# Patient Record
Sex: Male | Born: 1950 | Race: White | Hispanic: No | Marital: Married | Smoking: Never smoker
Health system: Southern US, Community
[De-identification: ages and names within clinical notes are randomized; demographics above are authoritative.]

## PROBLEM LIST (undated history)

## (undated) DIAGNOSIS — I10 Essential (primary) hypertension: Secondary | ICD-10-CM

## (undated) DIAGNOSIS — R011 Cardiac murmur, unspecified: Secondary | ICD-10-CM

## (undated) DIAGNOSIS — R7303 Prediabetes: Secondary | ICD-10-CM

## (undated) DIAGNOSIS — G4730 Sleep apnea, unspecified: Secondary | ICD-10-CM

## (undated) DIAGNOSIS — J189 Pneumonia, unspecified organism: Secondary | ICD-10-CM

## (undated) DIAGNOSIS — E119 Type 2 diabetes mellitus without complications: Secondary | ICD-10-CM

## (undated) DIAGNOSIS — K219 Gastro-esophageal reflux disease without esophagitis: Secondary | ICD-10-CM

## (undated) DIAGNOSIS — M1990 Unspecified osteoarthritis, unspecified site: Secondary | ICD-10-CM

## (undated) DIAGNOSIS — I82409 Acute embolism and thrombosis of unspecified deep veins of unspecified lower extremity: Secondary | ICD-10-CM

## (undated) HISTORY — DX: Gastro-esophageal reflux disease without esophagitis: K21.9

## (undated) HISTORY — PX: KNEE ARTHROSCOPY: SUR90

## (undated) HISTORY — PX: NASAL FRACTURE SURGERY: SHX718

## (undated) HISTORY — DX: Acute embolism and thrombosis of unspecified deep veins of unspecified lower extremity: I82.409

## (undated) HISTORY — PX: KNEE ARTHROPLASTY: SHX992

---

## 1978-06-14 HISTORY — PX: NASAL FRACTURE SURGERY: SHX718

## 1988-06-14 HISTORY — PX: CERVICAL SPINE SURGERY: SHX589

## 1997-11-30 ENCOUNTER — Ambulatory Visit (HOSPITAL_COMMUNITY): Admission: RE | Admit: 1997-11-30 | Discharge: 1997-11-30 | Payer: Self-pay | Admitting: Neurosurgery

## 2003-06-19 ENCOUNTER — Ambulatory Visit (HOSPITAL_COMMUNITY): Admission: RE | Admit: 2003-06-19 | Discharge: 2003-06-19 | Payer: Self-pay | Admitting: Orthopedic Surgery

## 2006-06-14 HISTORY — PX: FINGER SURGERY: SHX640

## 2006-10-12 ENCOUNTER — Ambulatory Visit (HOSPITAL_BASED_OUTPATIENT_CLINIC_OR_DEPARTMENT_OTHER): Admission: RE | Admit: 2006-10-12 | Discharge: 2006-10-12 | Payer: Self-pay | Admitting: Orthopedic Surgery

## 2006-11-02 ENCOUNTER — Ambulatory Visit (HOSPITAL_BASED_OUTPATIENT_CLINIC_OR_DEPARTMENT_OTHER): Admission: RE | Admit: 2006-11-02 | Discharge: 2006-11-02 | Payer: Self-pay | Admitting: Orthopedic Surgery

## 2007-03-01 ENCOUNTER — Encounter (INDEPENDENT_AMBULATORY_CARE_PROVIDER_SITE_OTHER): Payer: Self-pay | Admitting: Orthopedic Surgery

## 2007-03-01 ENCOUNTER — Ambulatory Visit (HOSPITAL_BASED_OUTPATIENT_CLINIC_OR_DEPARTMENT_OTHER): Admission: RE | Admit: 2007-03-01 | Discharge: 2007-03-01 | Payer: Self-pay | Admitting: Orthopedic Surgery

## 2010-10-27 NOTE — Op Note (Signed)
NAME:  James Gallegos, DHONDT NO.:  [id number]   MEDICAL RECORD NO.:  [id number]          PATIENT TYPE:  AMB   LOCATION:  DSC                          FACILITY:  [facility]   PHYSICIAN:  Artist Pais. Weingold, M.D.DATE OF BIRTH:  06-28-1950   DATE OF PROCEDURE:  03/01/2007  DATE OF DISCHARGE:                               OPERATIVE REPORT   PREOPERATIVE DIAGNOSIS:  1. Retained hardware, right index finger, status post corrective      osteotomy.  2. Subcutaneous mass, right forearm.   POSTOPERATIVE DIAGNOSIS:  1. Retained hardware, right index finger, status post corrective      osteotomy.  2. Subcutaneous mass, right forearm.   PROCEDURE:  1. Biopsy soft tissue mass distal forearm subcutaneous lesion over the      ulna.  2. Hardware removal right index finger with extensor tenolysis and      manipulation of metacarpal phalangeal joint and proximal      interphalangeal joint.   SURGEON:  Artist Pais. Mina Marble, M.D.   ASSISTANT:  None.   ANESTHESIA:  General.   TOURNIQUET TIME:  36 minutes.   COMPLICATIONS:  None.   DRAINS:  None.   OPERATIVE REPORT:  The patient was taken to the operating suite. After  induction of adequate general anesthesia, the right upper extremity was  prepped and draped in the usual sterile fashion.  An Esmarch was used to  exsanguinate the limb.  The tourniquet was inflated to 250 mmHg.  At  this point in time, a 2 cm was made in the subcutaneous area of the  ulna, distal third, where a small subcutaneous mass was identified.  The  skin was incised.  Dissection was carried down to what appeared to be a  small calcified lesion, well circumscribed. It was excised.  The wound  was irrigated and loosely closed with a 3-0 Prolene subcuticular stitch.   Attention was turned to the index finger.  Using the previous  curvilinear incision of the proximal phalanx, the skin was incised.  A  radially based flap was elevated and retracted using 4-0  nylon sutures.  The extensor mechanism was split longitudinally and carefully elevated  off the underlying periosteum and dorsal aspect of the proximal phalanx.  The periosteum was carefully elevated in a trapdoor fashion revealing  the previously placed 1.5 mm modular handset T-plate. The T-plate and  screws were removed. At this point in time, an extensive tenolysis was  performed proximally and distally using a Therapist, nutritional and tenotomy  scissors, proximal to the metacarpal phalangeal joint and distal to the  proximal phalangeal joint.  After this was done, the metacarpal  phalangeal joint and proximal interphalangeal joint were carefully  manipulated into full flexion.   After this done, the wound was irrigated.  Hemostasis was achieved with  bipolar cautery.  The extensor mechanism was then carefully realigned  using an inverted locked 4-0 Mersilene epitendinous stitch.  This was  followed by skin closure with 3-0 Prolene subcuticular stitch.  Steri-  Strips, 4x4s, fluffs and a compressive dressing was applied to both  incisions.  The patient tolerated  both procedures well and went to the  recovery room in stable fashion.      Artist Pais Mina Marble, M.D.  Electronically Signed     MAW/MEDQ  D:  03/01/2007  T:  03/01/2007  Job:  [id number]

## 2010-10-27 NOTE — Op Note (Signed)
NAME:  James Gallegos, James Gallegos NO.:  [id number]   MEDICAL RECORD NO.:  [id number]          PATIENT TYPE:  AMB   LOCATION:  DSC                          FACILITY:  [facility]   PHYSICIAN:  Artist Pais. 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   PROCEDURE:  Redo ORIF of proximal phalangeal osteotomy right index  finger.   SURGEON:  Artist Pais. Mina Marble, M.D.   ASSISTANT:  None.   ANESTHESIA:  General.   TOURNIQUET TIME:  One hour.   COMPLICATION:  None.   DRAINS:  None.   DESCRIPTION OF PROCEDURE:  The patient was taken to the operating suite  after induction of general anesthesia, right upper extremity was prepped  and draped in sterile fashion.  An esmarch was used to exsanguinate the  limb.  Tourniquet was then inflated 250 mm.  At this point in time,  using the previous C-shaped incision over the proximal phalanx, the skin  was incised, extensor mechanism was split longitudinally through the  previous incision.  The periosteum was elevated off the osteotomy site.  There were two 2 mm screws that had been placed for fixation and that  had been injured when he got his hand caught in the car door.  These had  both fractured spirally through the osteotomy site.  There was now an  unstable fracture through the step-cut osteotomy.  The screws were  removed.  Reduction was carefully achieved after clot and callus were  removed from the osteotomy site.  It was then fixed with a 1.5 mm T  plate with the T part of the plate distal to the osteotomy site and  going longitudinally across the osteotomy site, fixed with 1.5 mm  screws.  Intraoperative fluoroscopy  revealed adequate reduction on both  the AP, lateral and oblique view.  The wound was irrigated.  It was  loosely closed in layers of 4-0 Vicryl, 4-0 Mersilene for the extensor  mechanism and 3-0 Prolene subcuticular stitch on the skin.  The patient  tolerated the procedure well and went to the recovery room in stable  fashion.      Artist Pais Mina Marble, M.D.  Electronically Signed    MAW/MEDQ  D:  11/02/2006  T:  11/02/2006  Job:  [id number]

## 2010-10-30 NOTE — Op Note (Signed)
NAME:  James, Gallegos NO.:  [id number]   MEDICAL RECORD NO.:  [id number]          PATIENT TYPE:  AMB   LOCATION:  DSC                          FACILITY:  [facility]   PHYSICIAN:  Artist Pais. Weingold, M.D.DATE OF BIRTH:  03-02-1951   DATE OF PROCEDURE:  10/12/2006  DATE OF DISCHARGE:  10/12/2006                               OPERATIVE REPORT   SURGEON:  Molli Hazard A. Mina Marble, M.D.   ASSISTANT:  None.   PREOPERATIVE DIAGNOSIS:  Malrotated right index finger.   POSTOPERATIVE DIAGNOSIS:  Malrotated right index finger.   PROCEDURE:  Rotational osteotomy, right index finger.   NOTE:  This is a redictation.   ANESTHESIA:  General.   TOURNIQUET TIME:  40 minutes.   COMPLICATIONS:  No complications.   DRAINS:  No drains.   OPERATIVE REPORT:  The patient was taken to the operating suite.  After  the induction of adequate general anesthesia, the right upper extremity  was prepped and draped in sterile fashion.  An Esmarch bandage was used  to exsanguinate the limb.  The tourniquet was then inflated to 250 mmHg.  At this point in time, a longitudinal incision was made over the  proximal phalanx of the index finger.  The skin was incised.  The  extensor mechanism was split longitudinally.  A periosteal window was  made in the proximal phalanx in the proximal third.  The periosteum was  lifted up.  The osteotomy was performed in step-cut fashion.  It was  then rotated into a more neutral position.  It was then fixed with two  2.0-mm lag screws from radial to ulnar.  The periosteum was closed with  4-0 Vicryl, and the skin with a 3-0 Prolene subcuticular stitch, after  repairing the extensor mechanism with a looked 4-0 Mersilene stitch.  Xeroform, 4 x 4's and a volar splint were applied.  The patient  tolerated the procedure well and went to recovery in stable fashion.      Artist Pais Mina Marble, M.D.  Electronically Signed     MAW/MEDQ  D:  11/23/2006  T:   11/23/2006  Job:  [id number]

## 2010-10-30 NOTE — Op Note (Signed)
NAME:  James Gallegos, James Gallegos NO.:  [id number]   MEDICAL RECORD NO.:  [id number]          PATIENT TYPE:  AMB   LOCATION:  DSC                          FACILITY:  [facility]   PHYSICIAN:  Artist Pais. Weingold, M.D.DATE OF BIRTH:  Feb 27, 1951   DATE OF PROCEDURE:  10/12/2006  DATE OF DISCHARGE:                               OPERATIVE REPORT   PREOPERATIVE DIAGNOSIS:  Right index finger angular rotational  deformity.   POSTOPERATIVE DIAGNOSIS:  Right index finger angular rotational  deformity.   PROCEDURE:  Right index finger proximal phalangeal rotational osteotomy.   SURGEON:  Artist Pais. Mina Marble, M.D.   ASSISTANT:  None.   ANESTHESIA:  General.   TOURNIQUET TIME:  40 minutes.   No complications or drains.   OPERATIVE REPORT:  The patient was taken to operating suite. After  induction of adequate general anesthesia, the right upper extremity was  prepped and draped in the usual sterile fashion.  An Esmarch was used to  exsanguinate the limb.  The tourniquet was inflated to 250 mmHg.  At  this point in time, a C-shaped incision was made over the proximal  phalanx of the right index finger, and a large radially base flap was  elevated.  Once this was done, the extensor mechanism was longitudinally  split and retracted. The periosteum overlying the proximal phalanx was  opened in a trapdoor fashion.  Once this was done, an osteotomy was  created with a longitudinal 2.5 cm cut going along the long axis of the  proximal phalanx, followed by horizontal cuts to the joint surface  proximally and distally. Once this was completed, the finger was rotated  into a better position. The osteotomy was fixed with two 2 mm lag screws  from the modular handset, 10 mm in length from radial to ulnar.  Intraoperative fluoroscopy revealed good reduction in both the AP and  lateral plane, and anatomically he was correctly rotated.  The wound was  irrigated.  The periosteum was closed with  4-0 Vicryl, the extensor  mechanism with a running locked 4-0 Mersilene __________ stitch,  followed by skin closure with 4-0 Vicryl Rapide.  The patient was placed  in a sterile dressing of Xeroform, 4x4s, and a volar splint.  The  patient tolerated the procedure well and went to the recovery room in a  stable fashion.      Artist Pais Mina Marble, M.D.  Electronically Signed     MAW/MEDQ  D:  10/13/2006  T:  10/13/2006  Job:  [id number]

## 2011-03-25 LAB — POCT HEMOGLOBIN-HEMACUE
Hemoglobin: 15
Operator id: 123881

## 2011-04-05 ENCOUNTER — Inpatient Hospital Stay (INDEPENDENT_AMBULATORY_CARE_PROVIDER_SITE_OTHER)
Admission: RE | Admit: 2011-04-05 | Discharge: 2011-04-05 | Disposition: A | Discharge status: Home / Self Care | Payer: 59 | Source: Ambulatory Visit | Attending: Emergency Medicine | Admitting: Emergency Medicine

## 2011-04-05 DIAGNOSIS — J029 Acute pharyngitis, unspecified: Secondary | ICD-10-CM

## 2011-04-05 DIAGNOSIS — J069 Acute upper respiratory infection, unspecified: Secondary | ICD-10-CM

## 2011-10-13 ENCOUNTER — Encounter (HOSPITAL_COMMUNITY): Payer: Self-pay | Admitting: *Deleted

## 2011-10-13 ENCOUNTER — Emergency Department (HOSPITAL_COMMUNITY)
Admission: EM | Admit: 2011-10-13 | Discharge: 2011-10-13 | Disposition: A | Discharge status: Home / Self Care | Payer: 59 | Source: Home / Self Care | Attending: Family Medicine | Admitting: Family Medicine

## 2011-10-13 DIAGNOSIS — J329 Chronic sinusitis, unspecified: Secondary | ICD-10-CM

## 2011-10-13 HISTORY — DX: Cardiac murmur, unspecified: R01.1

## 2011-10-13 HISTORY — DX: Essential (primary) hypertension: I10

## 2011-10-13 MED ORDER — HYDROCOD POLST-CHLORPHEN POLST 10-8 MG/5ML PO LQCR: 5.0000 mL | Freq: Two times a day (BID) | ORAL | Status: DC | PRN

## 2011-10-13 MED ORDER — AZITHROMYCIN 250 MG PO TABS: 250.0000 mg | ORAL_TABLET | Freq: Every day | ORAL | Status: AC

## 2011-10-13 MED ORDER — FLUTICASONE PROPIONATE 50 MCG/ACT NA SUSP: 2.0000 | Freq: Every day | NASAL | Status: DC

## 2011-10-13 NOTE — Discharge Instructions (Signed)
Your lung examination was clear and not suspicious for lung infection. Use the prescription nasal spray as directed. May also use an over the counter antihistamine such as loratadine (Claritin), cetirizine (Zyrtec), or fexofenadine (Allegra). Use the cough syrup as directed, and as needed for symptom relief; do not use while working or driving. If no improvement in 48 to 72 hours, then fill antibiotic prescription and complete full course. Continue acetaminophen and/or ibuprofen for fever control. Return to care should your symptoms not improve, or worsen in any way.

## 2011-10-13 NOTE — ED Provider Notes (Signed)
History     CSN: [id number]  Arrival date & time 10/13/11  0806   First MD Initiated Contact with Patient 10/13/11 [phone]      Chief Complaint  Patient presents with  . Cough  . Sore Throat    (Consider location/radiation/quality/duration/timing/severity/associated sxs/prior treatment) HPI Comments: James Gallegos presents for evaluation of one week of productive cough, nasal congestion, fever. He denies any history of seasonal allergies, but does report symptoms of ear fullness, rhinorrhea, over the last 2 weeks. He denies any tobacco abuse.  Patient is a 61 y.o. male presenting with cough. The history is provided by the patient.  Cough This is a new problem. The current episode started more than 1 week ago. The problem occurs constantly. The problem has not changed since onset.The cough is productive of brown sputum. The maximum temperature recorded prior to his arrival was 100 to 100.9 F. The fever has been present for 5 days or more. Associated symptoms include ear pain, rhinorrhea, sore throat and wheezing. Pertinent negatives include no chest pain and no eye redness. He is not a smoker.    Past Medical History  Diagnosis Date  . Murmur, cardiac   . Hypertension     Past Surgical History  Procedure Date  . Back surgery   . Knee arthroscopy     History reviewed. No pertinent family history.  History  Substance Use Topics  . Smoking status: Never Smoker   . Smokeless tobacco: Not on file  . Alcohol Use: Yes      Review of Systems  Constitutional: Negative.   HENT: Positive for ear pain, congestion, sore throat and rhinorrhea.   Eyes: Negative.  Negative for redness.  Respiratory: Positive for cough and wheezing.   Cardiovascular: Negative.  Negative for chest pain.  Gastrointestinal: Negative.   Genitourinary: Negative.   Musculoskeletal: Negative.   Skin: Negative.   Neurological: Negative.     Allergies  Review of patient's allergies indicates no known  allergies.  Home Medications   Current Outpatient Rx  Name Route Sig Dispense Refill  . DILTIAZEM HCL 100 MG IV SOLR Oral Take 120 mg by mouth daily.    . TESTOSTERONE 50 MG/5GM TD GEL Transdermal Place 5 g onto the skin daily.    . AZITHROMYCIN 250 MG PO TABS Oral Take 1 tablet (250 mg total) by mouth daily. Take two tablets on first day, then one tablet each day for four days 6 tablet 0  . HYDROCOD POLST-CPM POLST ER 10-8 MG/5ML PO LQCR Oral Take 5 mLs by mouth every 12 (twelve) hours as needed. 140 mL 0  . FLUTICASONE PROPIONATE 50 MCG/ACT NA SUSP Nasal Place 2 sprays into the nose daily. 16 g 2    BP 170/89  Pulse 79  Temp(Src) 99.4 F (37.4 C) (Oral)  Resp 18  SpO2 98%  Physical Exam  Nursing note and vitals reviewed. Constitutional: He is oriented to person, place, and time. He appears well-developed and well-nourished.  HENT:  Head: Normocephalic and atraumatic.  Right Ear: Tympanic membrane is retracted.  Left Ear: Tympanic membrane is retracted.  Mouth/Throat: Uvula is midline, oropharynx is clear and moist and mucous membranes are normal.  Eyes: EOM are normal.  Neck: Normal range of motion.  Cardiovascular: Normal rate and regular rhythm.   Pulmonary/Chest: Effort normal and breath sounds normal. He has no decreased breath sounds. He has no wheezes. He has no rhonchi.  Musculoskeletal: Normal range of motion.  Neurological: He is alert and oriented  to person, place, and time.  Skin: Skin is warm and dry.  Psychiatric: His behavior is normal.    ED Course  Procedures (including critical care time)  Labs Reviewed - No data to display No results found.   1. Rhinosinusitis       MDM  Likely rhinitis, given clear lung exam; advised OTC antihistamine, given fluticasone with Tussionex; if no improvement, given delayed rx for z-pak        Renaee Munda, MD 10/13/11 [phone]

## 2011-10-13 NOTE — ED Notes (Signed)
Pt is here with complaints of productive cough with thick green sputum (more in the AM), fever and sore throat from coughing.

## 2012-03-02 ENCOUNTER — Other Ambulatory Visit: Payer: Self-pay | Admitting: Dermatology

## 2012-03-23 ENCOUNTER — Other Ambulatory Visit (HOSPITAL_COMMUNITY): Payer: Self-pay | Admitting: Family Medicine

## 2012-03-23 DIAGNOSIS — M79671 Pain in right foot: Secondary | ICD-10-CM

## 2012-03-24 ENCOUNTER — Ambulatory Visit (HOSPITAL_COMMUNITY): Payer: 59 | Attending: Family Medicine

## 2012-11-12 ENCOUNTER — Ambulatory Visit (HOSPITAL_BASED_OUTPATIENT_CLINIC_OR_DEPARTMENT_OTHER): Payer: 59 | Attending: Otolaryngology

## 2012-11-12 VITALS — Ht 69.0 in | Wt 230.0 lb

## 2012-11-12 DIAGNOSIS — R0609 Other forms of dyspnea: Secondary | ICD-10-CM | POA: Insufficient documentation

## 2012-11-12 DIAGNOSIS — G4733 Obstructive sleep apnea (adult) (pediatric): Secondary | ICD-10-CM | POA: Insufficient documentation

## 2012-11-12 DIAGNOSIS — Z9989 Dependence on other enabling machines and devices: Secondary | ICD-10-CM

## 2012-11-12 DIAGNOSIS — R0989 Other specified symptoms and signs involving the circulatory and respiratory systems: Secondary | ICD-10-CM | POA: Insufficient documentation

## 2012-11-18 DIAGNOSIS — G4733 Obstructive sleep apnea (adult) (pediatric): Secondary | ICD-10-CM

## 2012-11-18 DIAGNOSIS — R0989 Other specified symptoms and signs involving the circulatory and respiratory systems: Secondary | ICD-10-CM

## 2012-11-18 DIAGNOSIS — R0609 Other forms of dyspnea: Secondary | ICD-10-CM

## 2012-11-18 NOTE — Procedures (Signed)
NAME:  James Gallegos, James Gallegos NO.:  [id number]  MEDICAL RECORD NO.:  [id number]          PATIENT TYPE:  OUT  LOCATION:  SLEEP CENTER                 FACILITY:  [facility]  PHYSICIAN:  Clinton D. Maple Hudson, MD, FCCP, FACPDATE OF BIRTH:  1951-06-03  DATE OF STUDY:  11/12/2012                           NOCTURNAL POLYSOMNOGRAM  REFERRING PHYSICIAN:  Cristal Deer E. Ezzard Standing, M.D.  INDICATION FOR STUDY:  Hypersomnia with sleep apnea.  EPWORTH SLEEPINESS SCORE:  8/24.  BMI 34, weight 230 pounds, height 69 inches, neck 19 inches.  MEDICATIONS:  Home medications are charted and reviewed.  SLEEP ARCHITECTURE:  Split study protocol.  During the diagnostic phase, total sleep time 125.5 minutes with sleep efficiency 79.7%.  Stage I was 10.8%, stage II 74.1%, stage III absent, REM 15.1% of total sleep time. Sleep latency 17.5 minutes, REM latency 92.5 minutes, awake after sleep onset 15.5 minutes.  Arousal index 24.4.  Bedtime medication:  None.  RESPIRATORY DATA:  Split study protocol.  Apnea/hypopnea index (AHI) 90.8 per hour.  A total of 190 events was scored including 166 obstructive apneas, 1 central apneas, 15 mixed apneas, 7 hypopneas. Events were recorded as supine position.  REM AHI 72.6 per hour.  CPAP was titrated to 13 CWP, AHI 4 per hour.  He wore a medium ResMed AirFit F10 full face mask with heated humidifier.  OXYGEN DATA:  Moderate snoring before CPAP with oxygen desaturation to a nadir of 68% on room air.  With CPAP control, snoring was prevented and mean oxygen saturation held 95% on room air.  CARDIAC DATA:  Normal sinus rhythm.  MOVEMENT-PARASOMNIA:  No significant movement disturbance. Bathroom x1.  IMPRESSIONS-RECOMMENDATIONS: 1. Severe obstructive sleep apnea/hypopnea syndrome, AHI 90.8 per hour     with supine events.  Moderate snoring with oxygen desaturation to a     nadir of 68% on room air. 2. Successful CPAP titration to 13 CWP, AHI 4 per hour.  He wore  a     medium ResMed AirFit F10 mask with heated humidifier.  Snoring was     prevented and mean oxygen saturation held 95% on room air.     Clinton D. Maple Hudson, MD, [facility], FACP Diplomate, American Board of Sleep Medicine    CDY/MEDQ  D:  11/18/2012 09:02:32  T:  11/18/2012 21:02:12  Job:  [id number]

## 2014-06-29 ENCOUNTER — Encounter (HOSPITAL_COMMUNITY): Payer: Self-pay | Admitting: *Deleted

## 2014-06-29 ENCOUNTER — Emergency Department (HOSPITAL_COMMUNITY)
Admission: EM | Admit: 2014-06-29 | Discharge: 2014-06-29 | Disposition: A | Discharge status: Home / Self Care | Payer: 59 | Source: Home / Self Care | Attending: Emergency Medicine | Admitting: Emergency Medicine

## 2014-06-29 DIAGNOSIS — R1030 Lower abdominal pain, unspecified: Secondary | ICD-10-CM

## 2014-06-29 DIAGNOSIS — N452 Orchitis: Secondary | ICD-10-CM

## 2014-06-29 DIAGNOSIS — R300 Dysuria: Secondary | ICD-10-CM

## 2014-06-29 LAB — POCT URINALYSIS DIP (DEVICE)
Bilirubin Urine: NEGATIVE
Glucose, UA: NEGATIVE mg/dL
Hgb urine dipstick: NEGATIVE
Ketones, ur: NEGATIVE mg/dL
LEUKOCYTES UA: NEGATIVE
NITRITE: NEGATIVE
Protein, ur: NEGATIVE mg/dL
Specific Gravity, Urine: 1.025 (ref 1.005–1.030)
UROBILINOGEN UA: 0.2 mg/dL (ref 0.0–1.0)
pH: 5.5 (ref 5.0–8.0)

## 2014-06-29 LAB — OCCULT BLOOD, POC DEVICE: Fecal Occult Bld: NEGATIVE

## 2014-06-29 MED ORDER — PHENAZOPYRIDINE HCL 200 MG PO TABS: 200.0000 mg | ORAL_TABLET | Freq: Three times a day (TID) | ORAL | Status: DC

## 2014-06-29 MED ORDER — DOXYCYCLINE HYCLATE 100 MG PO CAPS: 100.0000 mg | ORAL_CAPSULE | Freq: Two times a day (BID) | ORAL | Status: DC

## 2014-06-29 NOTE — ED Notes (Signed)
C/o burning onset Tues.  Low abd. Pressure comes and goes.  Temp was 99.2.  C/o pain L testicle and  L groin last night.  No discharge noted.

## 2014-06-29 NOTE — Discharge Instructions (Signed)
Abdominal Pain Many things can cause abdominal pain. Usually, abdominal pain is not caused by a disease and will improve without treatment. It can often be observed and treated at home. Your health care provider will do a physical exam and possibly order blood tests and X-rays to help determine the seriousness of your pain. However, in many cases, more time must pass before a clear cause of the pain can be found. Before that point, your health care provider may not know if you need more testing or further treatment. HOME CARE INSTRUCTIONS  Monitor your abdominal pain for any changes. The following actions may help to alleviate any discomfort you are experiencing:  Only take over-the-counter or prescription medicines as directed by your health care provider.  Do not take laxatives unless directed to do so by your health care provider.  Try a clear liquid diet (broth, tea, or water) as directed by your health care provider. Slowly move to a bland diet as tolerated. SEEK MEDICAL CARE IF:  You have unexplained abdominal pain.  You have abdominal pain associated with nausea or diarrhea.  You have pain when you urinate or have a bowel movement.  You experience abdominal pain that wakes you in the night.  You have abdominal pain that is worsened or improved by eating food.  You have abdominal pain that is worsened with eating fatty foods.  You have a fever. SEEK IMMEDIATE MEDICAL CARE IF:   Your pain does not go away within 2 hours.  You keep throwing up (vomiting).  Your pain is felt only in portions of the abdomen, such as the right side or the left lower portion of the abdomen.  You pass bloody or black tarry stools. MAKE SURE YOU:  Understand these instructions.   Will watch your condition.   Will get help right away if you are not doing well or get worse.  Document Released: 03/10/2005 Document Revised: 06/05/2013 Document Reviewed: 02/07/2013 [facility] Patient Information  2015 [place], [place]. This information is not intended to replace advice given to you by your health care provider. Make sure you discuss any questions you have with your health care provider.  Dysuria Dysuria is the medical term for pain with urination. There are many causes for dysuria, but urinary tract infection is the most common. If a urinalysis was performed it can show that there is a urinary tract infection. A urine culture confirms that you or your child is sick. You will need to follow up with a healthcare provider because:  If a urine culture was done you will need to know the culture results and treatment recommendations.  If the urine culture was positive, you or your child will need to be put on antibiotics or know if the antibiotics prescribed are the right antibiotics for your urinary tract infection.  If the urine culture is negative (no urinary tract infection), then other causes may need to be explored or antibiotics need to be stopped. Today laboratory work may have been done and there does not seem to be an infection. If cultures were done they will take at least 24 to 48 hours to be completed. Today x-rays may have been taken and they read as normal. No cause can be found for the problems. The x-rays may be re-read by a radiologist and you will be contacted if additional findings are made. You or your child may have been put on medications to help with this problem until you can see your primary caregiver. If  the problems get better, see your primary caregiver if the problems return. If you were given antibiotics (medications which kill germs), take all of the mediations as directed for the full course of treatment.  If laboratory work was done, you need to find the results. Leave a telephone number where you can be reached. If this is not possible, make sure you find out how you are to get test results. HOME CARE INSTRUCTIONS   Drink lots of fluids. For adults, drink eight, 8  ounce glasses of clear juice or water a day. For children, replace fluids as suggested by your caregiver.  Empty the bladder often. Avoid holding urine for long periods of time.  After a bowel movement, women should cleanse front to back, using each tissue only once.  Empty your bladder before and after sexual intercourse.  Take all the medicine given to you until it is gone. You may feel better in a few days, but TAKE ALL MEDICINE.  Avoid caffeine, tea, alcohol and carbonated beverages, because they tend to irritate the bladder.  In men, alcohol may irritate the prostate.  Only take over-the-counter or prescription medicines for pain, discomfort, or fever as directed by your caregiver.  If your caregiver has given you a follow-up appointment, it is very important to keep that appointment. Not keeping the appointment could result in a chronic or permanent injury, pain, and disability. If there is any problem keeping the appointment, you must call back to this facility for assistance. SEEK IMMEDIATE MEDICAL CARE IF:   Back pain develops.  A fever develops.  There is nausea (feeling sick to your stomach) or vomiting (throwing up).  Problems are no better with medications or are getting worse. MAKE SURE YOU:   Understand these instructions.  Will watch your condition.  Will get help right away if you are not doing well or get worse. Document Released: 02/27/2004 Document Revised: 08/23/2011 Document Reviewed: 01/04/2008 [facility] Patient Information 2015 [place], [place]. This information is not intended to replace advice given to you by your health care provider. Make sure you discuss any questions you have with your health care provider.  Orchitis Orchitis is an infection of the testicle of usually sudden onset (happens quickly). It may be viral or bacterial (caused by germs). Usually with this illness there is generalized malaise (not feeling well) and fever. There is also pain.  There is usually tenderness and swelling of the scrotum and testicle. DIAGNOSIS  Your caregiver will perform an exam to make sure there is not another reason for the pain in your testicle. A rectal exam may be done to find out if the prostate is swollen and tender. Blood work may be done to see if your white blood cell count is elevated. This can help determine if an infection is viral or bacterial. A urinalysis can also determine what type of infection is present. Most bacterial infections can be treated with antibiotics (medications which kill germs). LET YOUR CAREGIVER KNOW ABOUT:  Allergies.  Medications taken including herbs, eye drops, over the counter medications, and creams.  Use of steroids (by mouth or creams).  Previous problems with anesthetics or novocaine.  Previous prostate infections.  History of blood clots (thrombophlebitis).  History of bleeding or blood problems.  Previous surgery.  Previous urinary tract infection.  Other health problems. HOME CARE INSTRUCTIONS   Apply cold packs to the scrotal area for twenty minutes, four times per day or as needed.  A scrotal support may be helpful.  Keep a small pillow or support under your testicles while lying or sitting down.  Only take over-the-counter or prescription medicines for pain, discomfort, or fever as directed by your caregiver.  Take all medications, including antibiotics, as directed. Take the antibiotics for the full prescribed length of time even if you are feeling better. SEEK IMMEDIATE MEDICAL CARE IF:   Your redness, swelling, or pain in the testicle increases or is not getting better.  You have a fever.  You have pain not relieved with medicines.  You have any worsening of any symptoms (problems) that originally brought you in for medical care. Document Released: 05/28/2000 Document Revised: 08/23/2011 Document Reviewed: 05/31/2005 [facility] Patient Information 2015 [place], [place]. This  information is not intended to replace advice given to you by your health care provider. Make sure you discuss any questions you have with your health care provider.  Pain of Unknown Etiology (Pain Without a Known Cause) You have come to your caregiver because of pain. Pain can occur in any part of the body. Often there is not a definite cause. If your laboratory (blood or urine) work was normal and X-rays or other studies were normal, your caregiver may treat you without knowing the cause of the pain. An example of this is the headache. Most headaches are diagnosed by taking a history. This means your caregiver asks you questions about your headaches. Your caregiver determines a treatment based on your answers. Usually testing done for headaches is normal. Often testing is not done unless there is no response to medications. Regardless of where your pain is located today, you can be given medications to make you comfortable. If no physical cause of pain can be found, most cases of pain will gradually leave as suddenly as they came.  If you have a painful condition and no reason can be found for the pain, it is important that you follow up with your caregiver. If the pain becomes worse or does not go away, it may be necessary to repeat tests and look further for a possible cause.  Only take over-the-counter or prescription medicines for pain, discomfort, or fever as directed by your caregiver.  For the protection of your privacy, test results cannot be given over the phone. Make sure you receive the results of your test. Ask how these results are to be obtained if you have not been informed. It is your responsibility to obtain your test results.  You may continue all activities unless the activities cause more pain. When the pain lessens, it is important to gradually resume normal activities. Resume activities by beginning slowly and gradually increasing the intensity and duration of the activities or  exercise. During periods of severe pain, bed rest may be helpful. Lie or sit in any position that is comfortable.  Ice used for acute (sudden) conditions may be effective. Use a large plastic bag filled with ice and wrapped in a towel. This may provide pain relief.  See your caregiver for continued problems. Your caregiver can help or refer you for exercises or physical therapy if necessary. If you were given medications for your condition, do not drive, operate machinery or power tools, or sign legal documents for 24 hours. Do not drink alcohol, take sleeping pills, or take other medications that may interfere with treatment. See your caregiver immediately if you have pain that is becoming worse and not relieved by medications. Document Released: 02/23/2001 Document Revised: 03/21/2013 Document Reviewed: 05/31/2005 [facility] Patient Information 2015 [place], [place].  This information is not intended to replace advice given to you by your health care provider. Make sure you discuss any questions you have with your health care provider. ° °

## 2014-06-29 NOTE — ED Provider Notes (Signed)
CSN: [id number]     Arrival date & time 06/29/14  1109 History   First MD Initiated Contact with Patient 06/29/14 1154     Chief Complaint  Patient presents with  . Urinary Tract Infection   (Consider location/radiation/quality/duration/timing/severity/associated sxs/prior Treatment) HPI        64 year old male presents complaining of possible urinary tract infection or prostatitis. Starting earlier this week he has lower abdominal burning pain and pressure that comes and goes, left testicle pain, and left groin pain. When the pain in the testicles started yesterday. He may have an infection that is spreading and he wanted to go ahead and be seen. Symptoms are mild to moderate. He denies any risk of STDs. He denies any history of urinary tract infections or prostatitis. No fever, chills, NVD. No recent travel or sick contacts.  Past Medical History  Diagnosis Date  . Murmur, cardiac   . Hypertension    Past Surgical History  Procedure Laterality Date  . Back surgery  1995    cervical laminectomy  . Finger surgery Right 2008    x 3 index finger  . Knee arthroscopy Bilateral     L 2004, R 2005   Family History  Problem Relation Age of Onset  . Alzheimer's disease Father    History  Substance Use Topics  . Smoking status: Never Smoker   . Smokeless tobacco: Not on file  . Alcohol Use: Yes     Comment: occasional    Review of Systems  Constitutional: Negative for fever and chills.  Gastrointestinal: Positive for abdominal pain. Negative for nausea, vomiting and diarrhea.  Genitourinary: Positive for dysuria and testicular pain. Negative for hematuria, discharge and scrotal swelling.  All other systems reviewed and are negative.   Allergies  Review of patient's allergies indicates no known allergies.  Home Medications   Prior to Admission medications   Medication Sig Start Date End Date Taking? Authorizing Provider  Ascorbic Acid (VITAMIN C) 1000 MG tablet Take 5,000 mg  by mouth daily.   Yes Historical Provider, MD  Cholecalciferol (VITAMIN D3) 1000 UNITS CAPS Take 1,000 Int'l Units by mouth.   Yes Historical Provider, MD  Cyanocobalamin (VITAMIN B 12 PO) Take 1,200 mcg by mouth daily.   Yes Historical Provider, MD  DHEA 25 MG CAPS Take 25 mg by mouth daily.   Yes Historical Provider, MD  Lysine 1000 MG TABS Take 1,000 mg by mouth 2 (two) times daily.   Yes Historical Provider, MD  saw palmetto 500 MG capsule Take 500 mg by mouth daily.   Yes Historical Provider, MD  TELMISARTAN PO Take 40 mg by mouth.   Yes Historical Provider, MD  testosterone (ANDROGEL) 50 MG/5GM GEL Place 5 g onto the skin daily.   Yes Historical Provider, MD  chlorpheniramine-HYDROcodone (TUSSIONEX PENNKINETIC ER) 10-8 MG/5ML LQCR Take 5 mLs by mouth every 12 (twelve) hours as needed. 10/13/11   Earlie Counts, MD  diltiazem (CARDIZEM) 100 MG injection Take 120 mg by mouth daily.    Historical Provider, MD  doxycycline (VIBRAMYCIN) 100 MG capsule Take 1 capsule (100 mg total) by mouth 2 (two) times daily. 06/29/14   Freeman Caldron Kimberlye Dilger, PA-C  fluticasone (FLONASE) 50 MCG/ACT nasal spray Place 2 sprays into the nose daily. 10/13/11 10/12/12  Earlie Counts, MD  phenazopyridine (PYRIDIUM) 200 MG tablet Take 1 tablet (200 mg total) by mouth 3 (three) times daily. 06/29/14   Freeman Caldron Irvin Bastin, PA-C   BP 165/84 mmHg  Pulse 78  Temp(Src) 98.9 F (37.2 C) (Oral)  Resp 20  SpO2 96% Physical Exam  Constitutional: He is oriented to person, place, and time. He appears well-developed and well-nourished. No distress.  HENT:  Head: Normocephalic.  Cardiovascular: Normal rate, regular rhythm and normal heart sounds.   Pulmonary/Chest: Effort normal and breath sounds normal. No respiratory distress.  Abdominal: Soft. He exhibits no mass. There is no tenderness. There is no rebound and no guarding.  Genitourinary: Rectum normal, prostate normal and penis normal. Right testis shows no tenderness. Left testis shows  tenderness.  Lymphadenopathy:       Right: No inguinal adenopathy present.       Left: No inguinal adenopathy present.  Neurological: He is alert and oriented to person, place, and time. Coordination normal.  Skin: Skin is warm and dry. No rash noted. He is not diaphoretic.  Psychiatric: He has a normal mood and affect. Judgment normal.  Nursing note and vitals reviewed.   ED Course  Procedures (including critical care time) Labs Review Labs Reviewed  URINE CULTURE  POCT URINALYSIS DIP (DEVICE)  OCCULT BLOOD, POC DEVICE    Imaging Review No results found.   MDM   1. Orchitis   2. Lower abdominal pain   3. Dysuria    He declines any STD testing, he says there is absolutely 0 chance that he would have an STD. His urine is normal. Urine culture sent. Prostate is normal. Treat with doxycycline for nonspecific urethritis cystitis, possible mycoplasma infection could cause this. Also Azo. Follow-up when necessary   New Prescriptions   DOXYCYCLINE (VIBRAMYCIN) 100 MG CAPSULE    Take 1 capsule (100 mg total) by mouth 2 (two) times daily.   PHENAZOPYRIDINE (PYRIDIUM) 200 MG TABLET    Take 1 tablet (200 mg total) by mouth 3 (three) times daily.       Liam Graham, PA-C 06/29/14 1302

## 2014-06-30 LAB — URINE CULTURE
CULTURE: NO GROWTH
Colony Count: NO GROWTH

## 2014-07-17 ENCOUNTER — Encounter (HOSPITAL_COMMUNITY): Payer: Self-pay | Admitting: *Deleted

## 2014-07-17 ENCOUNTER — Emergency Department (HOSPITAL_COMMUNITY)
Admission: EM | Admit: 2014-07-17 | Discharge: 2014-07-17 | Disposition: A | Discharge status: Home / Self Care | Payer: 59 | Source: Home / Self Care | Attending: Emergency Medicine | Admitting: Emergency Medicine

## 2014-07-17 DIAGNOSIS — N390 Urinary tract infection, site not specified: Secondary | ICD-10-CM

## 2014-07-17 LAB — POCT URINALYSIS DIP (DEVICE)
BILIRUBIN URINE: NEGATIVE
GLUCOSE, UA: NEGATIVE mg/dL
HGB URINE DIPSTICK: NEGATIVE
KETONES UR: NEGATIVE mg/dL
Leukocytes, UA: NEGATIVE
Nitrite: NEGATIVE
PH: 6 (ref 5.0–8.0)
PROTEIN: 30 mg/dL — AB
Specific Gravity, Urine: >=1.03 (ref 1.005–1.030)
Urobilinogen, UA: 0.2 mg/dL (ref 0.0–1.0)

## 2014-07-17 MED ORDER — CIPROFLOXACIN HCL 500 MG PO TABS
500.0000 mg | ORAL_TABLET | Freq: Two times a day (BID) | ORAL | Status: DC
Start: 2014-07-17 — End: 2015-04-17

## 2014-07-17 NOTE — ED Notes (Signed)
Pt  Reports  Symptoms  Of  Urinary      Burning  /  Discomfort            X   sev     Days   Seen      Recently       -         Pt   Reports         Some  Burning  Frequency         Took  A  Course  Of  Anti  Biotics

## 2014-07-17 NOTE — Discharge Instructions (Signed)

## 2014-07-17 NOTE — ED Provider Notes (Signed)
Chief Complaint   Urinary Tract Infection   History of Present Illness   James Gallegos is a 64 year old male who was seen here 2 weeks ago with orchitis and was treated with doxycycline. His urine which are at that time was negative. His digital rectal exam was negative. His symptoms improved somewhat, but then came back again 4 days ago. The patient notes burning with urination, bladder discomfort, and left testicle discomfort. He's had mild lower back pain. No fever, chills, nausea, or vomiting. His urinary stream is good and there is no blood in his urine. The patient is in a mutually monogamous relationship, and states that he has had 1 lifetime sexual partner.  Review of Systems   Other than as noted above, the patient denies any of the following symptoms: General:  No fever or chills. GI:  No abdominal pain, back pain, nausea, or vomiting. GU: Hematuria, urethral discharge, penile lesions, penile pain, testicular pain, swelling, or mass, or inguinal lymphadenopathy.  Hinds   Past medical history, family history, social history, meds, and allergies were reviewed.  He has high blood pressure and low testosterone. He takes medication for both.  Physical Exam    Vital signs:  BP 137/72 mmHg  Pulse 72  Temp(Src) 98.4 F (36.9 C) (Oral)  Resp 16  SpO2 95% Gen:  Alert, oriented, in no distress. Lungs:  Clear to auscultation, no wheezes, rales or rhonchi. Heart:  Regular rhythm, no gallop or murmer. Abdomen:  Flat and soft.  No tenderness to palpation, guarding, or rebound.  No hepato-splenomegaly or mass.  Bowel sounds were normally active.  No hernia. Genital exam:  No urethral discharge, no penile lesions, no inguinal adenopathy. Left testicle is minimally tender to palpation. No swelling or mass. Back:  No CVA tenderness.  Skin:  Clear, warm and dry.  Labs   Results for orders placed or performed during the hospital encounter of 07/17/14  POCT urinalysis dip (device)    Result Value Ref Range   Glucose, UA NEGATIVE NEGATIVE mg/dL   Bilirubin Urine NEGATIVE NEGATIVE   Ketones, ur NEGATIVE NEGATIVE mg/dL   Specific Gravity, Urine >=1.030 1.005 - 1.030   Hgb urine dipstick NEGATIVE NEGATIVE   pH 6.0 5.0 - 8.0   Protein, ur 30 (A) NEGATIVE mg/dL   Urobilinogen, UA 0.2 0.0 - 1.0 mg/dL   Nitrite NEGATIVE NEGATIVE   Leukocytes, UA NEGATIVE NEGATIVE    Assessment   The encounter diagnosis was UTI (lower urinary tract infection).   I think he has a mild urinary tract infection with possibly some orchitis and prostatitis. It appears the doxycycline didn't clear this up completely so I'll try a 15 day course of Cipro. If this doesn't work as next step is to go see a Dealer.  Plan   1.  Meds:  The following meds were prescribed:   Discharge Medication List as of 07/17/2014  8:38 AM    START taking these medications   Details  ciprofloxacin (CIPRO) 500 MG tablet Take 1 tablet (500 mg total) by mouth every 12 (twelve) hours., Starting 07/17/2014, Until Discontinued, Normal        2.  Patient Education/Counseling:  The patient was given appropriate handouts, self care instructions, and instructed in symptomatic relief.    3.  Follow up:  The patient was told to follow up here if no better in 3 to 4 days, or sooner if becoming worse in any way, and given some red flag symptoms such as  fever, persistent vomiting, pain, or difficulty urinating which would prompt immediate return.        Harden Mo, MD 07/17/14 [phone]

## 2014-07-18 LAB — URINE CULTURE
COLONY COUNT: NO GROWTH
CULTURE: NO GROWTH
SPECIAL REQUESTS: NORMAL

## 2014-12-09 ENCOUNTER — Other Ambulatory Visit: Payer: Self-pay

## 2015-04-17 ENCOUNTER — Emergency Department (HOSPITAL_COMMUNITY)
Admission: EM | Admit: 2015-04-17 | Discharge: 2015-04-17 | Disposition: A | Discharge status: Home / Self Care | Payer: 59 | Source: Home / Self Care | Attending: Family Medicine | Admitting: Family Medicine

## 2015-04-17 ENCOUNTER — Encounter (HOSPITAL_COMMUNITY): Payer: Self-pay | Admitting: *Deleted

## 2015-04-17 DIAGNOSIS — N4282 Prostatosis syndrome: Secondary | ICD-10-CM | POA: Diagnosis not present

## 2015-04-17 LAB — POCT URINALYSIS DIP (DEVICE)
BILIRUBIN URINE: NEGATIVE
Glucose, UA: NEGATIVE mg/dL
Hgb urine dipstick: NEGATIVE
Ketones, ur: NEGATIVE mg/dL
LEUKOCYTES UA: NEGATIVE
NITRITE: NEGATIVE
Protein, ur: NEGATIVE mg/dL
Specific Gravity, Urine: >=1.03 (ref 1.005–1.030)
Urobilinogen, UA: 0.2 mg/dL (ref 0.0–1.0)
pH: 5.5 (ref 5.0–8.0)

## 2015-04-17 MED ORDER — CIPROFLOXACIN HCL 500 MG PO TABS: 500.0000 mg | ORAL_TABLET | Freq: Two times a day (BID) | ORAL | Status: DC

## 2015-04-17 NOTE — Discharge Instructions (Signed)
Take all of medicine, reduce caffeine, see urologist for recheck.

## 2015-04-17 NOTE — ED Notes (Signed)
Pt  Reports     Symptoms  Of   Urgency       Burning  And  Testicular  Pain       Symptoms  For   Several days    Pt    Reports  Has had   Urinary  Tract  Infection  In  Past

## 2015-04-17 NOTE — ED Provider Notes (Addendum)
CSN: [id number]     Arrival date & time 04/17/15  1303 History   First MD Initiated Contact with Patient 04/17/15 1329     Chief Complaint  Patient presents with  . Urinary Tract Infection   (Consider location/radiation/quality/duration/timing/severity/associated sxs/prior Treatment) Patient is a 64 y.o. male presenting with urinary tract infection. The history is provided by the patient.  Urinary Tract Infection This is a recurrent problem. The current episode started more than 2 days ago. The problem has been gradually worsening. Pertinent negatives include no chest pain and no abdominal pain.    Past Medical History  Diagnosis Date  . Murmur, cardiac   . Hypertension    Past Surgical History  Procedure Laterality Date  . Back surgery  1995    cervical laminectomy  . Finger surgery Right 2008    x 3 index finger  . Knee arthroscopy Bilateral     L 2004, R 2005   Family History  Problem Relation Age of Onset  . Alzheimer's disease Father    Social History  Substance Use Topics  . Smoking status: Never Smoker   . Smokeless tobacco: None  . Alcohol Use: Yes     Comment: occasional    Review of Systems  Constitutional: Negative.   Cardiovascular: Negative for chest pain.  Gastrointestinal: Negative.  Negative for abdominal pain.  Genitourinary: Positive for dysuria, urgency and frequency. Negative for hematuria and flank pain.    Allergies  Review of patient's allergies indicates no known allergies.  Home Medications   Prior to Admission medications   Medication Sig Start Date End Date Taking? Authorizing Provider  Ascorbic Acid (VITAMIN C) 1000 MG tablet Take 5,000 mg by mouth daily.    Historical Provider, MD  chlorpheniramine-HYDROcodone (TUSSIONEX PENNKINETIC ER) 10-8 MG/5ML LQCR Take 5 mLs by mouth every 12 (twelve) hours as needed. 10/13/11   Earlie Counts, MD  Cholecalciferol (VITAMIN D3) 1000 UNITS CAPS Take 1,000 Int'l Units by mouth.    Historical Provider,  MD  ciprofloxacin (CIPRO) 500 MG tablet Take 1 tablet (500 mg total) by mouth 2 (two) times daily. 04/17/15   Billy Fischer, MD  Cyanocobalamin (VITAMIN B 12 PO) Take 1,200 mcg by mouth daily.    Historical Provider, MD  DHEA 25 MG CAPS Take 25 mg by mouth daily.    Historical Provider, MD  diltiazem (CARDIZEM) 100 MG injection Take 120 mg by mouth daily.    Historical Provider, MD  doxycycline (VIBRAMYCIN) 100 MG capsule Take 1 capsule (100 mg total) by mouth 2 (two) times daily. 06/29/14   Freeman Caldron Baker, PA-C  fluticasone (FLONASE) 50 MCG/ACT nasal spray Place 2 sprays into the nose daily. 10/13/11 10/12/12  Earlie Counts, MD  Lysine 1000 MG TABS Take 1,000 mg by mouth 2 (two) times daily.    Historical Provider, MD  phenazopyridine (PYRIDIUM) 200 MG tablet Take 1 tablet (200 mg total) by mouth 3 (three) times daily. 06/29/14   Liam Graham, PA-C  saw palmetto 500 MG capsule Take 500 mg by mouth daily.    Historical Provider, MD  TELMISARTAN PO Take 40 mg by mouth.    Historical Provider, MD  testosterone (ANDROGEL) 50 MG/5GM GEL Place 5 g onto the skin daily.    Historical Provider, MD   Meds Ordered and Administered this Visit  Medications - No data to display  BP 158/82 mmHg  Pulse 66  Temp(Src) 99.6 F (37.6 C) (Oral)  Resp 20  SpO2 96% No data  found.   Physical Exam  Constitutional: He is oriented to person, place, and time. He appears well-developed and well-nourished. No distress.  Abdominal: Soft. Bowel sounds are normal. There is no tenderness.  Genitourinary:  Has had recent psa and prostate check.  Musculoskeletal: Normal range of motion.  Neurological: He is alert and oriented to person, place, and time.  Skin: Skin is warm and dry.  Nursing note and vitals reviewed.   ED Course  Procedures (including critical care time)  Labs Review Labs Reviewed  POCT URINALYSIS DIP (DEVICE)    Imaging Review No results found.   Visual Acuity Review  Right Eye Distance:    Left Eye Distance:   Bilateral Distance:    Right Eye Near:   Left Eye Near:    Bilateral Near:         MDM   1. Prostatitis syndrome    Pt relates that similar problem prev treated with cipro resolved promptly.    Billy Fischer, MD 04/17/15 Why, MD 04/17/15 1430

## 2015-06-15 HISTORY — PX: OTHER SURGICAL HISTORY: SHX169

## 2015-06-28 DIAGNOSIS — G4733 Obstructive sleep apnea (adult) (pediatric): Secondary | ICD-10-CM | POA: Diagnosis not present

## 2015-07-03 MED FILL — DICLOFENAC SOD EC 75 MG TAB: 75 | 30 days supply | Qty: 60 | Fill #1

## 2015-07-04 MED FILL — TELMISARTAN 40 MG TABLET: 40 | 30 days supply | Qty: 30 | Fill #0

## 2015-07-29 DIAGNOSIS — M1711 Unilateral primary osteoarthritis, right knee: Secondary | ICD-10-CM | POA: Diagnosis not present

## 2015-07-29 MED FILL — HYDROCODON-APAP 5-325: 5-325 | 10 days supply | Qty: 30 | Fill #0

## 2015-08-04 MED FILL — TELMISARTAN 40 MG TABLET: 40 | 30 days supply | Qty: 30 | Fill #1

## 2015-08-05 DIAGNOSIS — R5381 Other malaise: Secondary | ICD-10-CM | POA: Diagnosis not present

## 2015-08-05 DIAGNOSIS — E291 Testicular hypofunction: Secondary | ICD-10-CM | POA: Diagnosis not present

## 2015-09-02 MED FILL — TELMISARTAN 40 MG TABLET: 40 | 30 days supply | Qty: 30 | Fill #2

## 2015-09-12 MED FILL — metFORMIN HCL 500 MG TABS: 500 | 60 days supply | Qty: 60 | Fill #2

## 2015-10-08 MED FILL — TELMISARTAN 40 MG TABLET: 40 | 30 days supply | Qty: 30 | Fill #0

## 2015-10-20 DIAGNOSIS — M170 Bilateral primary osteoarthritis of knee: Secondary | ICD-10-CM | POA: Diagnosis not present

## 2015-10-20 DIAGNOSIS — M2021 Hallux rigidus, right foot: Secondary | ICD-10-CM | POA: Diagnosis not present

## 2015-10-20 DIAGNOSIS — M25561 Pain in right knee: Secondary | ICD-10-CM | POA: Diagnosis not present

## 2015-11-05 MED FILL — TELMISARTAN 40 MG TABLET: 40 | 30 days supply | Qty: 30 | Fill #1

## 2015-11-12 DIAGNOSIS — H43811 Vitreous degeneration, right eye: Secondary | ICD-10-CM | POA: Diagnosis not present

## 2015-12-08 MED FILL — TELMISARTAN 40 MG TABLET: 40 | 30 days supply | Qty: 30 | Fill #2

## 2015-12-08 MED FILL — DICLOFENAC SOD EC 75 MG TAB: 75 | 30 days supply | Qty: 60 | Fill #2

## 2015-12-31 DIAGNOSIS — G4733 Obstructive sleep apnea (adult) (pediatric): Secondary | ICD-10-CM | POA: Diagnosis not present

## 2016-01-05 DIAGNOSIS — E119 Type 2 diabetes mellitus without complications: Secondary | ICD-10-CM | POA: Diagnosis not present

## 2016-01-05 DIAGNOSIS — E291 Testicular hypofunction: Secondary | ICD-10-CM | POA: Diagnosis not present

## 2016-01-06 MED FILL — TELMISARTAN 40 MG TABLET: 40 | 30 days supply | Qty: 30 | Fill #3

## 2016-02-04 MED FILL — TELMISARTAN 40 MG TABLET: 40 | 30 days supply | Qty: 30 | Fill #0

## 2016-03-03 MED FILL — TELMISARTAN 40 MG TABLET: 40 | 30 days supply | Qty: 30 | Fill #1

## 2016-03-18 ENCOUNTER — Encounter (INDEPENDENT_AMBULATORY_CARE_PROVIDER_SITE_OTHER): Payer: Self-pay

## 2016-03-18 ENCOUNTER — Ambulatory Visit (INDEPENDENT_AMBULATORY_CARE_PROVIDER_SITE_OTHER): Payer: 59 | Admitting: Orthopaedic Surgery

## 2016-03-18 DIAGNOSIS — M1711 Unilateral primary osteoarthritis, right knee: Secondary | ICD-10-CM | POA: Diagnosis not present

## 2016-03-18 DIAGNOSIS — M79671 Pain in right foot: Secondary | ICD-10-CM | POA: Diagnosis not present

## 2016-03-18 DIAGNOSIS — M2021 Hallux rigidus, right foot: Secondary | ICD-10-CM

## 2016-03-18 DIAGNOSIS — M1712 Unilateral primary osteoarthritis, left knee: Secondary | ICD-10-CM

## 2016-03-18 DIAGNOSIS — M25571 Pain in right ankle and joints of right foot: Secondary | ICD-10-CM

## 2016-03-18 MED FILL — HYDROCODON-APAP 5-325: 5-325 | 3 days supply | Qty: 30 | Fill #0

## 2016-03-18 MED FILL — DICLOFENAC SOD 75 MG TAB EC: 75 | 30 days supply | Qty: 60 | Fill #0

## 2016-04-05 MED FILL — TELMISARTAN 40 MG TABLET: 40 | 30 days supply | Qty: 30 | Fill #2

## 2016-04-07 ENCOUNTER — Telehealth (INDEPENDENT_AMBULATORY_CARE_PROVIDER_SITE_OTHER): Payer: Self-pay | Admitting: Orthopaedic Surgery

## 2016-04-07 NOTE — Telephone Encounter (Signed)
Patient states he has spoken with Dr. Durward Fortes about knee replacement surgery and he would like to go ahead and proceed with that. Please advise.

## 2016-04-07 NOTE — Telephone Encounter (Signed)
Please advise 

## 2016-04-08 ENCOUNTER — Encounter (INDEPENDENT_AMBULATORY_CARE_PROVIDER_SITE_OTHER): Payer: Self-pay

## 2016-04-08 NOTE — Telephone Encounter (Signed)
PLEASE CALL HIM AND GET NAMES OF DOCS AND SEND THEM CLEARANCES.James Gallegos

## 2016-04-09 NOTE — Telephone Encounter (Signed)
Spoke with pt and one clearance letter sent to PCP: [facility], Dr. Judeen Hammans on 04/09/16.

## 2016-04-14 ENCOUNTER — Telehealth (INDEPENDENT_AMBULATORY_CARE_PROVIDER_SITE_OTHER): Payer: Self-pay | Admitting: *Deleted

## 2016-04-14 NOTE — Telephone Encounter (Signed)
Patient states he seen Dr. Durward Fortes and he was suppose to be scheduled for surgery. The surgical scheduler called him and told him he need cardiac clearance before surgery. Patient doesn't have a cardiologist. He need a referral to a cardiologist . He prefers someone within [facility] due to insurance. He is requesting a call back of the status of the referral. His number is [phone]. Please advise. Thank you .

## 2016-04-27 ENCOUNTER — Encounter (INDEPENDENT_AMBULATORY_CARE_PROVIDER_SITE_OTHER): Payer: Self-pay | Admitting: Orthopaedic Surgery

## 2016-04-27 ENCOUNTER — Ambulatory Visit (INDEPENDENT_AMBULATORY_CARE_PROVIDER_SITE_OTHER): Payer: 59 | Admitting: Orthopaedic Surgery

## 2016-04-27 VITALS — BP 142/82 | HR 68 | Resp 14 | Ht 69.0 in | Wt 218.0 lb

## 2016-04-27 DIAGNOSIS — G8929 Other chronic pain: Secondary | ICD-10-CM

## 2016-04-27 DIAGNOSIS — M25561 Pain in right knee: Secondary | ICD-10-CM

## 2016-04-27 NOTE — Progress Notes (Signed)
   Office Visit Note   Patient: James Gallegos           Date of Birth: [birth year]           MRN: [id number] Visit Date: 04/27/2016              Requested by: No referring provider defined for this encounter. PCP: No PCP Per Patient   Assessment & Plan: Visit Diagnoses: No diagnosis found.  Plan: Schedule a right total knee replacement after medical clearance. I have given him a clearance form for his physician in [place]. We had a long discussion about surgery which supplements that which he had with Aaron Edelman. Was some confusion as to whether or not he needed a cardiac clearance. There is no history of chest pain or cardiac issues. He can be fine with just a medical clearance. 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  Follow-Up Instructions: No Follow-up on file.   Orders:  No orders of the defined types were placed in this encounter.  No orders of the defined types were placed in this encounter.     Procedures: No procedures performed   Clinical Data: No additional findings.   Subjective: No chief complaint on file.   Pt here today to discuss Right knee surgery. Pt wants to know if he needs to go a cardiologist, even though he has never had any heart issues. His appt is Wednesday 11/15 @1 ;30.    Review of Systems  Constitutional: Negative.   HENT: Negative.   Eyes: Negative.   Respiratory: Negative.   Cardiovascular: Negative.   Gastrointestinal: Negative.   Endocrine: Negative.   Genitourinary: Negative.   Musculoskeletal: Negative.   Skin: Negative.   Allergic/Immunologic: Negative.   Neurological: Negative.   Hematological: Negative.   Psychiatric/Behavioral: Negative.      Objective: Vital Signs: There were no vitals taken for this visit.  Physical Exam  Ortho Exam right knee exam reveals minimal effusion. He does have diffuse medial joint pain. There is no popliteal fullness or calf discomfort. He did lack a few degrees to full  extension. Neurologically intact  Specialty Comments:  No specialty comments available.  Imaging: No results found.   PMFS History: There are no active problems to display for this patient.  Past Medical History:  Diagnosis Date  . Hypertension   . Murmur, cardiac     Family History  Problem Relation Age of Onset  . Alzheimer's disease Father     Past Surgical History:  Procedure Laterality Date  . BACK SURGERY  1995   cervical laminectomy  . FINGER SURGERY Right 2008   x 3 index finger  . KNEE ARTHROSCOPY Bilateral    L 2004, R 2005   Social History   Occupational History  . Not on file.   Social History Main Topics  . Smoking status: Never Smoker  . Smokeless tobacco: Not on file  . Alcohol use Yes     Comment: occasional  . Drug use: No  . Sexual activity: Not on file

## 2016-04-28 ENCOUNTER — Ambulatory Visit: Payer: 59 | Admitting: Cardiovascular Disease

## 2016-04-29 ENCOUNTER — Telehealth (INDEPENDENT_AMBULATORY_CARE_PROVIDER_SITE_OTHER): Payer: Self-pay | Admitting: Orthopaedic Surgery

## 2016-04-29 NOTE — Telephone Encounter (Signed)
Patient is having surgery in December and would like Aaron Edelman to call him on his cell at [phone]

## 2016-05-03 MED FILL — TELMISARTAN 40 MG TABLET: 40 | 30 days supply | Qty: 30 | Fill #3

## 2016-05-11 NOTE — Telephone Encounter (Signed)
Called Dr Einar Gip and he will clear him for surgery

## 2016-05-17 DIAGNOSIS — Z01810 Encounter for preprocedural cardiovascular examination: Secondary | ICD-10-CM | POA: Diagnosis not present

## 2016-05-17 DIAGNOSIS — G4733 Obstructive sleep apnea (adult) (pediatric): Secondary | ICD-10-CM | POA: Diagnosis not present

## 2016-05-17 DIAGNOSIS — I10 Essential (primary) hypertension: Secondary | ICD-10-CM | POA: Diagnosis not present

## 2016-05-17 DIAGNOSIS — Z9989 Dependence on other enabling machines and devices: Secondary | ICD-10-CM | POA: Diagnosis not present

## 2016-05-18 MED FILL — TELMISARTAN-HCTZ 80-12.5 MG: 80-12.5 | 30 days supply | Qty: 30 | Fill #0

## 2016-05-18 MED FILL — EPLERENONE 25 MG TABLET: 25 | 30 days supply | Qty: 30 | Fill #0

## 2016-05-19 ENCOUNTER — Ambulatory Visit (INDEPENDENT_AMBULATORY_CARE_PROVIDER_SITE_OTHER): Payer: 59 | Admitting: Orthopedic Surgery

## 2016-05-19 ENCOUNTER — Encounter (INDEPENDENT_AMBULATORY_CARE_PROVIDER_SITE_OTHER): Payer: Self-pay | Admitting: Orthopedic Surgery

## 2016-05-19 VITALS — BP 141/66 | HR 65 | Temp 97.9°F | Resp 14 | Ht 69.5 in | Wt 228.0 lb

## 2016-05-19 DIAGNOSIS — G4733 Obstructive sleep apnea (adult) (pediatric): Secondary | ICD-10-CM

## 2016-05-19 DIAGNOSIS — I10 Essential (primary) hypertension: Secondary | ICD-10-CM | POA: Diagnosis not present

## 2016-05-19 DIAGNOSIS — K469 Unspecified abdominal hernia without obstruction or gangrene: Secondary | ICD-10-CM | POA: Insufficient documentation

## 2016-05-19 DIAGNOSIS — K439 Ventral hernia without obstruction or gangrene: Secondary | ICD-10-CM

## 2016-05-19 DIAGNOSIS — M1711 Unilateral primary osteoarthritis, right knee: Secondary | ICD-10-CM

## 2016-05-19 DIAGNOSIS — G4730 Sleep apnea, unspecified: Secondary | ICD-10-CM | POA: Insufficient documentation

## 2016-05-19 NOTE — Progress Notes (Deleted)
   Office Visit Note   Patient: James Gallegos           Date of Birth: [birth year]           MRN: [id number] Visit Date: 05/19/2016              Requested by: No referring provider defined for this encounter. PCP: No PCP Per Patient   Assessment & Plan: Visit Diagnoses: No diagnosis found.  Plan: ***  Follow-Up Instructions: No Follow-up on file.   Orders:  No orders of the defined types were placed in this encounter.  No orders of the defined types were placed in this encounter.     Procedures: No procedures performed   Clinical Data: No additional findings.   Subjective: Chief Complaint  Patient presents with  . Right Knee - Pain    Pre op visit, 06/01/16    Review of Systems   Objective: Vital Signs: BP (!) 141/66 (BP Location: Left Arm, Patient Position: Sitting, Cuff Size: Normal)   Pulse 65   Temp 97.9 F (36.6 C)   Resp 14   Ht 5' 9.5" (1.765 m)   Wt 228 lb (103.4 kg)   BMI 33.19 kg/m   Physical Exam  Ortho Exam  Specialty Comments:  No specialty comments available.  Imaging: No results found.   PMFS History: There are no active problems to display for this patient.  Past Medical History:  Diagnosis Date  . Hypertension   . Murmur, cardiac     Family History  Problem Relation Age of Onset  . Alzheimer's disease Father     Past Surgical History:  Procedure Laterality Date  . CERVICAL SPINE SURGERY N/A 1990  . FINGER SURGERY Right 2008   x 3 index finger  . KNEE ARTHROSCOPY Bilateral    L 2004, R 2005   Social History   Occupational History  . Not on file.   Social History Main Topics  . Smoking status: Never Smoker  . Smokeless tobacco: Never Used  . Alcohol use Yes     Comment: occasional  . Drug use: No  . Sexual activity: Not on file

## 2016-05-19 NOTE — H&P (Addendum)
[name], MD   Biagio Borg, PA-C [address], [place], [place]  [address]                             [phone]   ORTHOPAEDIC HISTORY & PHYSICAL  James Gallegos MRN:  [id number] DOB/SEX:  December 13, 1950/male  CHIEF COMPLAINT:  Painful right Knee  HISTORY: Patient is a 65 y.o. male presented with a history of pain in the right knee for 2 years. Onset of symptoms was gradual starting 1 year ago with gradually worsening course since that time. Prior procedures on the knee are arthroscopy. Patient has been treated conservatively with over-the-counter NSAIDs and activity modification. Patient currently rates pain in the knee at 7 out of 10 with activity. There is pain at night. present.  They have been previously treated with: NSAIDS: Diclofenac, NSAID with mild improvement  Knee injection with corticosteroid  was performed Knee injection with visco supplementation was performed Medications: Diclofenac, NSAID, Steriods,  with mild improvement  PAST MEDICAL HISTORY:     Patient Active Problem List   Diagnosis Date Noted  . Sleep apnea 05/19/2016  . Hypertension 05/19/2016  . Abdominal hernia 05/19/2016       Past Medical History:  Diagnosis Date  . Deep vein thrombosis ([place])   . Hypertension   . Murmur, cardiac         Past Surgical History:  Procedure Laterality Date  . CERVICAL SPINE SURGERY N/A 1990  . FINGER SURGERY Right 2008   x 3 index finger  . KNEE ARTHROSCOPY Bilateral    L 2004, R 2005     MEDICATIONS PRIOR TO ADMISSION:        Prior to Admission medications   Medication Sig Start Date End Date Taking? Authorizing Provider  Ascorbic Acid (VITAMIN C) 1000 MG tablet Take 5,000 mg by mouth daily.   Yes Historical Provider, MD  B Complex-C (B-COMPLEX WITH VITAMIN C) tablet Take 1 tablet by mouth daily.   Yes Historical Provider, MD  Cholecalciferol (VITAMIN D-3) 5000 units TABS Take 5,000 Units by mouth daily.   Yes  Historical Provider, MD  Cyanocobalamin (VITAMIN B-12) 5000 MCG TBDP Take 5,000 mcg by mouth daily.   Yes Historical Provider, MD  DHEA 50 MG TABS Take 50 mg by mouth daily.   Yes Historical Provider, MD  diclofenac (VOLTAREN) 75 MG EC tablet Take 75 mg by mouth 2 (two) times daily.   Yes Historical Provider, MD  eplerenone (INSPRA) 25 MG tablet Take 25 mg by mouth daily.   Yes Historical Provider, MD  fluticasone (FLONASE) 50 MCG/ACT nasal spray Place 2 sprays into the nose daily. Patient taking differently: Place 1-2 sprays into the nose at bedtime as needed (for allergies/nasal symptoms).  10/13/11 05/19/17 Yes Earlie Counts, MD  Lysine 1000 MG TABS Take 1,000 mg by mouth 2 (two) times daily.   Yes Historical Provider, MD  MAGNESIUM GLYCINATE PLUS PO Take 400 mg by mouth at bedtime.   Yes Historical Provider, MD  Menaquinone-7 (VITAMIN K2) 100 MCG CAPS Take 100 mcg by mouth 2 (two) times daily.   Yes Historical Provider, MD  saw palmetto 500 MG capsule Take 500 mg by mouth daily.   Yes Historical Provider, MD  telmisartan-hydrochlorothiazide (MICARDIS HCT) 80-12.5 MG tablet Take 1 tablet by mouth daily.   Yes Historical Provider, MD  Testosterone 12.5 MG/ACT (1%) GEL Apply 1 mL topically at bedtime. Apply to shoulders  at bedtime.   Yes Historical Provider, MD     ALLERGIES:  No Known Allergies  REVIEW OF SYSTEMS:  Review of Systems  Constitutional: Negative.   HENT: Negative.   Eyes: Negative.   Respiratory:       Sleep apnea on CPAP  Cardiovascular:       Hypertension  Gastrointestinal:       Midline abdominal hernia  Genitourinary: Negative.   Skin: Negative.   Neurological: Negative.   Endo/Heme/Allergies: Negative.   Psychiatric/Behavioral: Negative.     FAMILY HISTORY:        Family History  Problem Relation Age of Onset  . Alzheimer's disease Father     SOCIAL HISTORY:   Social History      Occupational History  . Not on file.           Social History Main Topics  . Smoking status: Never Smoker  . Smokeless tobacco: Never Used  . Alcohol use 0.6 oz/week    1 Glasses of wine per week     Comment: occasional  . Drug use: No  . Sexual activity: Not on file     EXAMINATION:  Vital signs in last 24 hours: BP (!) 141/66 (BP Location: Left Arm, Patient Position: Sitting, Cuff Size: Normal)   Pulse 65   Temp 97.9 F (36.6 C)   Resp 14   Ht 5' 9.5" (1.765 m)   Wt 228 lb (103.4 kg)   BMI 33.19 kg/m   Physical Exam  Constitutional: He is oriented to person, place, and time. He appears well-developed and well-nourished.  HENT:  Head: Normocephalic and atraumatic.  Eyes: Conjunctivae and EOM are normal. Pupils are equal, round, and reactive to light.  Cardiovascular: Normal rate, regular rhythm and intact distal pulses.   Murmur (grade 1/6 systolic murmur) heard. Pulmonary/Chest: Effort normal and breath sounds normal.  Abdominal: Soft. Bowel sounds are normal. A hernia (Midline abdominal supraumbilical) is present.  Musculoskeletal:       Right knee: He exhibits effusion ( mild).  Neurological: He is alert and oriented to person, place, and time.  Skin: Skin is warm and dry.  Psychiatric: He has a normal mood and affect. His behavior is normal. Judgment and thought content normal.   Right Knee Exam   Tenderness  The patient is experiencing tenderness in the medial joint line.  Range of Motion  Extension: 5  Flexion: 110   Other  Sensation: normal Pulse: present Other tests: effusion ( mild) present     Imaging Review Plain radiographs demonstrate moderate degenerative joint disease of the right knee. The overall alignment is mild varus. The bone quality appears to be good for age and reported activity level.  ASSESSMENT: End stage arthritis, right knee      Past Medical History:  Diagnosis Date  . Deep vein thrombosis ([place])   . Hypertension   . Murmur, cardiac      PLAN: Plan for right total knee replacement.  The patient history, physical examination and imaging studies are consistent with advanced degenerative joint disease of the right knee. The patient has failed conservative treatment.  The clearance notes were reviewed.  After discussion with the patient it was felt that Total Knee Replacement was indicated. The procedure,  risks, and benefits of total knee arthroplasty were presented and reviewed. The risks including but not limited to aseptic loosening, infection, blood clots, vascular and nerve injury, stiffness, patella tracking problems and fracture complications among others were discussed. The patient  acknowledged the explanation, agreed to proceed with total knee replacement.  Mike Craze [place], [place] [phone]  05/31/2016 1:16 PM

## 2016-05-19 NOTE — Progress Notes (Signed)
James Fears, MD   James Borg, PA-C [address] [place], [place]  [address]                             [phone]   ORTHOPAEDIC HISTORY & PHYSICAL  James Gallegos MRN:  [id number] DOB/SEX:  1951-05-31/male  CHIEF COMPLAINT:  Painful right Knee  HISTORY: Patient is a 65 y.o. male presented with a history of pain in the right knee for 2 years. Onset of symptoms was gradual starting 1 year ago with gradually worsening course since that time. Prior procedures on the knee are arthroscopy. Patient has been treated conservatively with over-the-counter NSAIDs and activity modification. Patient currently rates pain in the knee at 7 out of 10 with activity. There is pain at night. present.  They have been previously treated with: NSAIDS: Diclofenac, NSAID with mild improvement  Knee injection with corticosteroid  was performed Knee injection with visco supplementation was performed Medications: Diclofenac, NSAID, Steriods,  with mild improvement  PAST MEDICAL HISTORY: Patient Active Problem List   Diagnosis Date Noted  . Sleep apnea 05/19/2016  . Hypertension 05/19/2016  . Abdominal hernia 05/19/2016   Past Medical History:  Diagnosis Date  . Deep vein thrombosis ([place])   . Hypertension   . Murmur, cardiac    Past Surgical History:  Procedure Laterality Date  . CERVICAL SPINE SURGERY N/A 1990  . FINGER SURGERY Right 2008   x 3 index finger  . KNEE ARTHROSCOPY Bilateral    L 2004, R 2005     MEDICATIONS PRIOR TO ADMISSION: Prior to Admission medications   Medication Sig Start Date End Date Taking? Authorizing Provider  Ascorbic Acid (VITAMIN C) 1000 MG tablet Take 5,000 mg by mouth daily.   Yes Historical Provider, MD  B Complex-C (B-COMPLEX WITH VITAMIN C) tablet Take 1 tablet by mouth daily.   Yes Historical Provider, MD  Cholecalciferol (VITAMIN D-3) 5000 units TABS Take 5,000 Units by mouth daily.   Yes Historical Provider, MD  Cyanocobalamin (VITAMIN  B-12) 5000 MCG TBDP Take 5,000 mcg by mouth daily.   Yes Historical Provider, MD  DHEA 50 MG TABS Take 50 mg by mouth daily.   Yes Historical Provider, MD  diclofenac (VOLTAREN) 75 MG EC tablet Take 75 mg by mouth 2 (two) times daily.   Yes Historical Provider, MD  eplerenone (INSPRA) 25 MG tablet Take 25 mg by mouth daily.   Yes Historical Provider, MD  fluticasone (FLONASE) 50 MCG/ACT nasal spray Place 2 sprays into the nose daily. Patient taking differently: Place 1-2 sprays into the nose at bedtime as needed (for allergies/nasal symptoms).  10/13/11 05/19/17 Yes Earlie Counts, MD  Lysine 1000 MG TABS Take 1,000 mg by mouth 2 (two) times daily.   Yes Historical Provider, MD  MAGNESIUM GLYCINATE PLUS PO Take 400 mg by mouth at bedtime.   Yes Historical Provider, MD  Menaquinone-7 (VITAMIN K2) 100 MCG CAPS Take 100 mcg by mouth 2 (two) times daily.   Yes Historical Provider, MD  saw palmetto 500 MG capsule Take 500 mg by mouth daily.   Yes Historical Provider, MD  telmisartan-hydrochlorothiazide (MICARDIS HCT) 80-12.5 MG tablet Take 1 tablet by mouth daily.   Yes Historical Provider, MD  Testosterone 12.5 MG/ACT (1%) GEL Apply 1 mL topically at bedtime. Apply to shoulders at bedtime.   Yes Historical Provider, MD     ALLERGIES:  No Known Allergies  REVIEW OF  SYSTEMS:  Review of Systems  Constitutional: Negative.   HENT: Negative.   Eyes: Negative.   Respiratory:       Sleep apnea on CPAP  Cardiovascular:       Hypertension  Gastrointestinal:       Midline abdominal hernia  Genitourinary: Negative.   Skin: Negative.   Neurological: Negative.   Endo/Heme/Allergies: Negative.   Psychiatric/Behavioral: Negative.     FAMILY HISTORY:   Family History  Problem Relation Age of Onset  . Alzheimer's disease Father     SOCIAL HISTORY:   Social History   Occupational History  . Not on file.   Social History Main Topics  . Smoking status: Never Smoker  . Smokeless tobacco: Never Used    . Alcohol use 0.6 oz/week    1 Glasses of wine per week     Comment: occasional  . Drug use: No  . Sexual activity: Not on file     EXAMINATION:  Vital signs in last 24 hours: BP (!) 141/66 (BP Location: Left Arm, Patient Position: Sitting, Cuff Size: Normal)   Pulse 65   Temp 97.9 F (36.6 C)   Resp 14   Ht 5' 9.5" (1.765 m)   Wt 228 lb (103.4 kg)   BMI 33.19 kg/m   Physical Exam  Constitutional: He is oriented to person, place, and time. He appears well-developed and well-nourished.  HENT:  Head: Normocephalic and atraumatic.  Eyes: Conjunctivae and EOM are normal. Pupils are equal, round, and reactive to light.  Cardiovascular: Normal rate, regular rhythm and intact distal pulses.   Murmur (grade 1/6 systolic murmur) heard. Pulmonary/Chest: Effort normal and breath sounds normal.  Abdominal: Soft. Bowel sounds are normal. A hernia (Midline abdominal supraumbilical) is present.  Musculoskeletal:       Right knee: He exhibits effusion ( mild).  Neurological: He is alert and oriented to person, place, and time.  Skin: Skin is warm and dry.  Psychiatric: He has a normal mood and affect. His behavior is normal. Judgment and thought content normal.   Right Knee Exam   Tenderness  The patient is experiencing tenderness in the medial joint line.  Range of Motion  Extension: 5  Flexion: 110   Other  Sensation: normal Pulse: present Other tests: effusion ( mild) present      Imaging Review Plain radiographs demonstrate moderate degenerative joint disease of the right knee. The overall alignment is mild varus. The bone quality appears to be good for age and reported activity level.  ASSESSMENT: End stage arthritis, right knee  Past Medical History:  Diagnosis Date  . Deep vein thrombosis ([place])   . Hypertension   . Murmur, cardiac     PLAN: Plan for right total knee replacement.  The patient history, physical examination and imaging studies are consistent  with advanced degenerative joint disease of the right knee. The patient has failed conservative treatment.  The clearance notes were reviewed.  After discussion with the patient it was felt that Total Knee Replacement was indicated. The procedure,  risks, and benefits of total knee arthroplasty were presented and reviewed. The risks including but not limited to aseptic loosening, infection, blood clots, vascular and nerve injury, stiffness, patella tracking problems and fracture complications among others were discussed. The patient acknowledged the explanation, agreed to proceed with total knee replacement.  James Gallegos 05/19/2016, 3:21 PM

## 2016-05-20 DIAGNOSIS — Z01810 Encounter for preprocedural cardiovascular examination: Secondary | ICD-10-CM | POA: Diagnosis not present

## 2016-05-20 DIAGNOSIS — E785 Hyperlipidemia, unspecified: Secondary | ICD-10-CM | POA: Diagnosis not present

## 2016-05-20 DIAGNOSIS — I10 Essential (primary) hypertension: Secondary | ICD-10-CM | POA: Diagnosis not present

## 2016-05-20 NOTE — Pre-Procedure Instructions (Signed)
James Gallegos  05/20/2016      [place], [place] - [address] [facility], [place]. [address] [place] [address] Phone: [phone] Fax: [phone]    Your procedure is scheduled on Tuesday December 19.  Report to [facility] Admitting at 5:30 A.M.  Call this number if you have problems the morning of surgery:  [phone]   Remember:  Do not eat food or drink liquids after midnight.  Take these medicines the morning of surgery with A SIP OF WATER: NONE  7 days prior to surgery STOP taking any Aspirin, Aleve, Naproxen, Ibuprofen, Motrin, Advil, Goody's, BC's, all herbal medications, fish oil, and all vitamins    Do not wear jewelry, make-up or nail polish.  Do not wear lotions, powders, or perfumes, or deoderant.  Do not shave 48 hours prior to surgery.  Men may shave face and neck.  Do not bring valuables to the hospital.  [facility] is not responsible for any belongings or valuables.  Contacts, dentures or bridgework may not be worn into surgery.  Leave your suitcase in the car.  After surgery it may be brought to your room.  For patients admitted to the hospital, discharge time will be determined by your treatment team.  Patients discharged the day of surgery will not be allowed to drive home.    Special instructions:    [place]- Preparing For Surgery  Before surgery, you can play an important role. Because skin is not sterile, your skin needs to be as free of germs as possible. You can reduce the number of germs on your skin by washing with CHG (chlorahexidine gluconate) Soap before surgery.  CHG is an antiseptic cleaner which kills germs and bonds with the skin to continue killing germs even after washing.  Please do not use if you have an allergy to CHG or antibacterial soaps. If your skin becomes reddened/irritated stop using the CHG.  Do not shave (including legs and underarms) for at least 48 hours  prior to first CHG shower. It is OK to shave your face.  Please follow these instructions carefully.   1. Shower the NIGHT BEFORE SURGERY and the MORNING OF SURGERY with CHG.   2. If you chose to wash your hair, wash your hair first as usual with your normal shampoo.  3. After you shampoo, rinse your hair and body thoroughly to remove the shampoo.  4. Use CHG as you would any other liquid soap. You can apply CHG directly to the skin and wash gently with a scrungie or a clean washcloth.   5. Apply the CHG Soap to your body ONLY FROM THE NECK DOWN.  Do not use on open wounds or open sores. Avoid contact with your eyes, ears, mouth and genitals (private parts). Wash genitals (private parts) with your normal soap.  6. Wash thoroughly, paying special attention to the area where your surgery will be performed.  7. Thoroughly rinse your body with warm water from the neck down.  8. DO NOT shower/wash with your normal soap after using and rinsing off the CHG Soap.  9. Pat yourself dry with a CLEAN TOWEL.   10. Wear CLEAN PAJAMAS   11. Place CLEAN SHEETS on your bed the night of your first shower and DO NOT SLEEP WITH PETS.    Day of Surgery: Do not apply any deodorants/lotions. Please wear clean clothes to the hospital/surgery center.  Please read over the following fact sheets that you were given. MRSA Information

## 2016-05-21 ENCOUNTER — Encounter (HOSPITAL_COMMUNITY)
Admission: RE | Admit: 2016-05-21 | Discharge: 2016-05-21 | Disposition: A | Discharge status: Home / Self Care | Payer: 59 | Source: Ambulatory Visit | Attending: Orthopaedic Surgery | Admitting: Orthopaedic Surgery

## 2016-05-21 ENCOUNTER — Ambulatory Visit (HOSPITAL_COMMUNITY)
Admission: RE | Admit: 2016-05-21 | Discharge: 2016-05-21 | Disposition: A | Discharge status: Home / Self Care | Payer: 59 | Source: Ambulatory Visit | Attending: Orthopedic Surgery | Admitting: Orthopedic Surgery

## 2016-05-21 ENCOUNTER — Encounter (HOSPITAL_COMMUNITY): Payer: Self-pay

## 2016-05-21 DIAGNOSIS — R918 Other nonspecific abnormal finding of lung field: Secondary | ICD-10-CM | POA: Diagnosis not present

## 2016-05-21 DIAGNOSIS — M1711 Unilateral primary osteoarthritis, right knee: Secondary | ICD-10-CM | POA: Insufficient documentation

## 2016-05-21 DIAGNOSIS — Z01812 Encounter for preprocedural laboratory examination: Secondary | ICD-10-CM | POA: Insufficient documentation

## 2016-05-21 DIAGNOSIS — Z01818 Encounter for other preprocedural examination: Secondary | ICD-10-CM

## 2016-05-21 DIAGNOSIS — I517 Cardiomegaly: Secondary | ICD-10-CM | POA: Diagnosis not present

## 2016-05-21 HISTORY — DX: Sleep apnea, unspecified: G47.30

## 2016-05-21 LAB — CBC WITH DIFFERENTIAL/PLATELET
BASOS ABS: 0 10*3/uL (ref 0.0–0.1)
BASOS PCT: 1 %
Eosinophils Absolute: 0.2 10*3/uL (ref 0.0–0.7)
Eosinophils Relative: 4 %
HEMATOCRIT: 41.7 % (ref 39.0–52.0)
HEMOGLOBIN: 15 g/dL (ref 13.0–17.0)
LYMPHS PCT: 35 %
Lymphs Abs: 1.8 10*3/uL (ref 0.7–4.0)
MCH: 32.8 pg (ref 26.0–34.0)
MCHC: 36 g/dL (ref 30.0–36.0)
MCV: 91.2 fL (ref 78.0–100.0)
Monocytes Absolute: 0.5 10*3/uL (ref 0.1–1.0)
Monocytes Relative: 9 %
NEUTROS ABS: 2.7 10*3/uL (ref 1.7–7.7)
NEUTROS PCT: 51 %
Platelets: 177 10*3/uL (ref 150–400)
RBC: 4.57 MIL/uL (ref 4.22–5.81)
RDW: 12.6 % (ref 11.5–15.5)
WBC: 5.2 10*3/uL (ref 4.0–10.5)

## 2016-05-21 LAB — SURGICAL PCR SCREEN
MRSA, PCR: NEGATIVE
STAPHYLOCOCCUS AUREUS: NEGATIVE

## 2016-05-21 LAB — COMPREHENSIVE METABOLIC PANEL
ALBUMIN: 4.2 g/dL (ref 3.5–5.0)
ALK PHOS: 45 U/L (ref 38–126)
ALT: 29 U/L (ref 17–63)
AST: 22 U/L (ref 15–41)
Anion gap: 8 (ref 5–15)
BILIRUBIN TOTAL: 0.9 mg/dL (ref 0.3–1.2)
BUN: 22 mg/dL — AB (ref 6–20)
CO2: 23 mmol/L (ref 22–32)
CREATININE: 1.03 mg/dL (ref 0.61–1.24)
Calcium: 10 mg/dL (ref 8.9–10.3)
Chloride: 106 mmol/L (ref 101–111)
GFR calc Af Amer: >60 mL/min (ref >60)
GFR calc non Af Amer: >60 mL/min (ref >60)
GLUCOSE: 115 mg/dL — AB (ref 65–99)
POTASSIUM: 4.1 mmol/L (ref 3.5–5.1)
Sodium: 137 mmol/L (ref 135–145)
TOTAL PROTEIN: 7 g/dL (ref 6.5–8.1)

## 2016-05-21 LAB — ABO/RH: ABO/RH(D): A POS

## 2016-05-21 LAB — APTT: APTT: 24 s (ref 24–36)

## 2016-05-21 LAB — TYPE AND SCREEN
ABO/RH(D): A POS
ANTIBODY SCREEN: NEGATIVE

## 2016-05-21 LAB — PROTIME-INR
INR: 0.94
Prothrombin Time: 12.6 seconds (ref 11.4–15.2)

## 2016-05-21 MED FILL — DICLOFENAC SOD 75 MG TAB EC: 75 | 30 days supply | Qty: 60 | Fill #1

## 2016-05-21 NOTE — Progress Notes (Signed)
No PCP Saw Dr. Einar Gip for surgical clearance last week, stress test and EKG requested  Pt denies cardiac hx No c/o chest pain, SOB signs of infection per pt at PAT appointment  CXR: 05/21/16

## 2016-05-22 LAB — URINE CULTURE: Culture: NO GROWTH

## 2016-05-24 NOTE — Progress Notes (Addendum)
Anesthesia Chart Review:  Pt is a 65 year old male scheduled for R total knee arthroplasty on 06/01/2016 with [name], MD.   PMH includes:  HTN, heart murmur, DVT, OSA. Never smoker. BMI 33  Medications include: inspra, telmisartan-hctz.   Preoperative labs reviewed.    CXR 05/21/16:  1. Cardiomegaly.  Mild pulmonary vascular prominence. 2. Low lung volumes.  EKG 05/17/16: Sinus rhythm. Incomplete RBBB. No evidence of ischemia.  Nuclear stress test 05/20/16:  Overall LV systolic function was normal without regional wall motion abnormalities. LVEF 69%. This is a normal/low risk study.  Pt saw Neldon Labella, NP with cardiology for pre-op evaluation 05/17/16. Stress test ordered, results above. Dr. Einar Gip in that office cleared pt for surgery on results of stress test.   If no changes, I anticipate pt can proceed with surgery as scheduled.   Willeen Cass, FNP-BC [facility] Short Stay Surgical Center/Anesthesiology Phone: [phone] 05/26/2016 1:37 PM

## 2016-05-31 DIAGNOSIS — I10 Essential (primary) hypertension: Secondary | ICD-10-CM | POA: Diagnosis not present

## 2016-05-31 MED ORDER — SODIUM CHLORIDE 0.9 % IV SOLN
2000.0000 mg | INTRAVENOUS | Status: DC
  Filled 2016-05-31: qty 20

## 2016-05-31 NOTE — Anesthesia Preprocedure Evaluation (Addendum)
Anesthesia Evaluation  Patient identified by MRN, date of birth, ID band Patient awake    Reviewed: Allergy & Precautions, NPO status , Patient's Chart, lab work & pertinent test results  History of Anesthesia Complications Negative for: history of anesthetic complications  Airway Mallampati: II  TM Distance: >3 FB Neck ROM: Full    Dental no notable dental hx. (+) Dental Advisory Given   Pulmonary sleep apnea and Continuous Positive Airway Pressure Ventilation ,    Pulmonary exam normal        Cardiovascular hypertension, negative cardio ROS Normal cardiovascular exam     Neuro/Psych  Headaches, negative psych ROS   GI/Hepatic negative GI ROS, Neg liver ROS,   Endo/Other  negative endocrine ROS  Renal/GU negative Renal ROS     Musculoskeletal negative musculoskeletal ROS (+)   Abdominal   Peds  Hematology negative hematology ROS (+)   Anesthesia Other Findings Day of surgery medications reviewed with the patient.  Reproductive/Obstetrics                            Anesthesia Physical Anesthesia Plan  ASA: II  Anesthesia Plan: MAC and Spinal   Post-op Pain Management:  Regional for Post-op pain   Induction:   Airway Management Planned: Natural Airway  Additional Equipment:   Intra-op Plan:   Post-operative Plan:   Informed Consent: I have reviewed the patients History and Physical, chart, labs and discussed the procedure including the risks, benefits and alternatives for the proposed anesthesia with the patient or authorized representative who has indicated his/her understanding and acceptance.   Dental advisory given  Plan Discussed with: CRNA, Anesthesiologist and Surgeon  Anesthesia Plan Comments:        Anesthesia Quick Evaluation

## 2016-06-01 ENCOUNTER — Inpatient Hospital Stay (HOSPITAL_COMMUNITY)
Admission: RE | Admit: 2016-06-01 | Discharge: 2016-06-03 | DRG: 470 | Disposition: A | Discharge status: Home Health | Payer: 59 | Source: Ambulatory Visit | Attending: Orthopaedic Surgery | Admitting: Orthopaedic Surgery

## 2016-06-01 ENCOUNTER — Encounter (HOSPITAL_COMMUNITY)
Admission: RE | Disposition: A | Discharge status: Home Health | Payer: Self-pay | Source: Ambulatory Visit | Attending: Orthopaedic Surgery

## 2016-06-01 ENCOUNTER — Inpatient Hospital Stay (HOSPITAL_COMMUNITY): Payer: 59 | Admitting: Emergency Medicine

## 2016-06-01 ENCOUNTER — Inpatient Hospital Stay (HOSPITAL_COMMUNITY): Payer: 59 | Admitting: Anesthesiology

## 2016-06-01 ENCOUNTER — Encounter (HOSPITAL_COMMUNITY): Payer: Self-pay | Admitting: *Deleted

## 2016-06-01 DIAGNOSIS — Z96651 Presence of right artificial knee joint: Secondary | ICD-10-CM

## 2016-06-01 DIAGNOSIS — I10 Essential (primary) hypertension: Secondary | ICD-10-CM | POA: Diagnosis not present

## 2016-06-01 DIAGNOSIS — M25561 Pain in right knee: Secondary | ICD-10-CM

## 2016-06-01 DIAGNOSIS — Z79899 Other long term (current) drug therapy: Secondary | ICD-10-CM | POA: Diagnosis not present

## 2016-06-01 DIAGNOSIS — R262 Difficulty in walking, not elsewhere classified: Secondary | ICD-10-CM

## 2016-06-01 DIAGNOSIS — G4733 Obstructive sleep apnea (adult) (pediatric): Secondary | ICD-10-CM | POA: Diagnosis not present

## 2016-06-01 DIAGNOSIS — M1711 Unilateral primary osteoarthritis, right knee: Principal | ICD-10-CM

## 2016-06-01 DIAGNOSIS — G8918 Other acute postprocedural pain: Secondary | ICD-10-CM | POA: Diagnosis not present

## 2016-06-01 DIAGNOSIS — R269 Unspecified abnormalities of gait and mobility: Secondary | ICD-10-CM | POA: Diagnosis not present

## 2016-06-01 DIAGNOSIS — G4730 Sleep apnea, unspecified: Secondary | ICD-10-CM | POA: Diagnosis present

## 2016-06-01 DIAGNOSIS — M659 Synovitis and tenosynovitis, unspecified: Secondary | ICD-10-CM | POA: Diagnosis present

## 2016-06-01 HISTORY — PX: TOTAL KNEE ARTHROPLASTY: SHX125

## 2016-06-01 HISTORY — DX: Unspecified osteoarthritis, unspecified site: M19.90

## 2016-06-01 SURGERY — ARTHROPLASTY, KNEE, TOTAL
Anesthesia: Monitor Anesthesia Care | Site: Knee | Laterality: Right

## 2016-06-01 MED ORDER — BUPIVACAINE IN DEXTROSE 0.75-8.25 % IT SOLN
INTRATHECAL | Status: DC | PRN
  Administered 2016-06-01: 15 mg via INTRATHECAL

## 2016-06-01 MED ORDER — ONDANSETRON HCL 4 MG PO TABS: 4.0000 mg | ORAL_TABLET | Freq: Four times a day (QID) | ORAL | Status: DC | PRN

## 2016-06-01 MED ORDER — FLUTICASONE PROPIONATE 50 MCG/ACT NA SUSP
2.0000 | Freq: Every day | NASAL | Status: DC
  Administered 2016-06-01 – 2016-06-02 (×2): 2 via NASAL
  Filled 2016-06-01: qty 16

## 2016-06-01 MED ORDER — 0.9 % SODIUM CHLORIDE (POUR BTL) OPTIME
TOPICAL | Status: DC | PRN
  Administered 2016-06-01: 1000 mL

## 2016-06-01 MED ORDER — FENTANYL CITRATE (PF) 100 MCG/2ML IJ SOLN
INTRAMUSCULAR | Status: AC
  Filled 2016-06-01: qty 2

## 2016-06-01 MED ORDER — VITAMIN B-12 1000 MCG PO TABS
5000.0000 ug | ORAL_TABLET | Freq: Every day | ORAL | Status: DC
  Administered 2016-06-01 – 2016-06-03 (×3): 5000 ug via ORAL
  Filled 2016-06-01 (×3): qty 5

## 2016-06-01 MED ORDER — LIDOCAINE 2% (20 MG/ML) 5 ML SYRINGE
INTRAMUSCULAR | Status: DC | PRN
  Administered 2016-06-01: 25 mg via INTRAVENOUS

## 2016-06-01 MED ORDER — FENTANYL CITRATE (PF) 100 MCG/2ML IJ SOLN
INTRAMUSCULAR | Status: DC | PRN
  Administered 2016-06-01 (×2): 50 ug via INTRAVENOUS

## 2016-06-01 MED ORDER — OXYCODONE HCL 5 MG PO TABS
ORAL_TABLET | ORAL | Status: AC
  Filled 2016-06-01: qty 2

## 2016-06-01 MED ORDER — MIDAZOLAM HCL 5 MG/5ML IJ SOLN
INTRAMUSCULAR | Status: DC | PRN
  Administered 2016-06-01 (×2): 1 mg via INTRAVENOUS

## 2016-06-01 MED ORDER — SPIRONOLACTONE 25 MG PO TABS
25.0000 mg | ORAL_TABLET | Freq: Every day | ORAL | Status: DC
  Administered 2016-06-01 – 2016-06-03 (×3): 25 mg via ORAL
  Filled 2016-06-01 (×3): qty 1

## 2016-06-01 MED ORDER — METOCLOPRAMIDE HCL 5 MG/ML IJ SOLN: 5.0000 mg | Freq: Three times a day (TID) | INTRAMUSCULAR | Status: DC | PRN

## 2016-06-01 MED ORDER — ACETAMINOPHEN 10 MG/ML IV SOLN
1000.0000 mg | Freq: Once | INTRAVENOUS | Status: AC
  Administered 2016-06-01: 1000 mg via INTRAVENOUS

## 2016-06-01 MED ORDER — PROPOFOL 10 MG/ML IV BOLUS
INTRAVENOUS | Status: AC
  Filled 2016-06-01: qty 20

## 2016-06-01 MED ORDER — RIVAROXABAN 10 MG PO TABS
10.0000 mg | ORAL_TABLET | Freq: Every day | ORAL | Status: DC
  Administered 2016-06-02 – 2016-06-03 (×2): 10 mg via ORAL
  Filled 2016-06-01 (×2): qty 1

## 2016-06-01 MED ORDER — PROMETHAZINE HCL 25 MG/ML IJ SOLN: 6.2500 mg | INTRAMUSCULAR | Status: DC | PRN

## 2016-06-01 MED ORDER — DIPHENHYDRAMINE HCL 12.5 MG/5ML PO ELIX: 12.5000 mg | ORAL_SOLUTION | ORAL | Status: DC | PRN

## 2016-06-01 MED ORDER — MENTHOL 3 MG MT LOZG: 1.0000 | LOZENGE | OROMUCOSAL | Status: DC | PRN

## 2016-06-01 MED ORDER — VITAMIN C 500 MG PO TABS
1000.0000 mg | ORAL_TABLET | Freq: Every day | ORAL | Status: DC
  Administered 2016-06-01 – 2016-06-03 (×3): 1000 mg via ORAL
  Filled 2016-06-01 (×3): qty 2

## 2016-06-01 MED ORDER — DOCUSATE SODIUM 100 MG PO CAPS
100.0000 mg | ORAL_CAPSULE | Freq: Two times a day (BID) | ORAL | Status: DC
  Administered 2016-06-01 – 2016-06-03 (×5): 100 mg via ORAL
  Filled 2016-06-01 (×5): qty 1

## 2016-06-01 MED ORDER — METHOCARBAMOL 500 MG PO TABS
500.0000 mg | ORAL_TABLET | Freq: Four times a day (QID) | ORAL | Status: DC | PRN
  Administered 2016-06-01 – 2016-06-02 (×6): 500 mg via ORAL
  Filled 2016-06-01 (×7): qty 1

## 2016-06-01 MED ORDER — ALUM & MAG HYDROXIDE-SIMETH 200-200-20 MG/5ML PO SUSP: 30.0000 mL | ORAL | Status: DC | PRN

## 2016-06-01 MED ORDER — POLYETHYLENE GLYCOL 3350 17 G PO PACK: 17.0000 g | PACK | Freq: Every day | ORAL | Status: DC | PRN

## 2016-06-01 MED ORDER — TESTOSTERONE 12.5 MG/ACT (1%) TD GEL: 1.0000 mL | Freq: Every day | TRANSDERMAL | Status: DC

## 2016-06-01 MED ORDER — KETOROLAC TROMETHAMINE 15 MG/ML IJ SOLN
7.5000 mg | Freq: Four times a day (QID) | INTRAMUSCULAR | Status: AC
  Administered 2016-06-01 – 2016-06-02 (×4): 7.5 mg via INTRAVENOUS
  Filled 2016-06-01 (×4): qty 1

## 2016-06-01 MED ORDER — CHLORHEXIDINE GLUCONATE 4 % EX LIQD: 60.0000 mL | Freq: Once | CUTANEOUS | Status: DC

## 2016-06-01 MED ORDER — MIDAZOLAM HCL 2 MG/2ML IJ SOLN
INTRAMUSCULAR | Status: AC
  Filled 2016-06-01: qty 2

## 2016-06-01 MED ORDER — ONDANSETRON HCL 4 MG/2ML IJ SOLN: 4.0000 mg | Freq: Four times a day (QID) | INTRAMUSCULAR | Status: DC | PRN

## 2016-06-01 MED ORDER — MAGNESIUM CITRATE PO SOLN: 1.0000 | Freq: Once | ORAL | Status: DC | PRN

## 2016-06-01 MED ORDER — ACETAMINOPHEN 10 MG/ML IV SOLN
1000.0000 mg | Freq: Four times a day (QID) | INTRAVENOUS | Status: AC
  Administered 2016-06-01 – 2016-06-02 (×4): 1000 mg via INTRAVENOUS
  Filled 2016-06-01 (×4): qty 100

## 2016-06-01 MED ORDER — PHENOL 1.4 % MT LIQD: 1.0000 | OROMUCOSAL | Status: DC | PRN

## 2016-06-01 MED ORDER — BISACODYL 10 MG RE SUPP: 10.0000 mg | Freq: Every day | RECTAL | Status: DC | PRN

## 2016-06-01 MED ORDER — SODIUM CHLORIDE 0.9 % IV SOLN
INTRAVENOUS | Status: DC
  Administered 2016-06-01: 14:00:00 via INTRAVENOUS

## 2016-06-01 MED ORDER — VITAMIN D3 25 MCG (1000 UNIT) PO TABS
5000.0000 [IU] | ORAL_TABLET | Freq: Every day | ORAL | Status: DC
  Administered 2016-06-02 – 2016-06-03 (×2): 5000 [IU] via ORAL
  Filled 2016-06-01 (×4): qty 5

## 2016-06-01 MED ORDER — OXYCODONE HCL 5 MG PO TABS
5.0000 mg | ORAL_TABLET | ORAL | Status: DC | PRN
  Administered 2016-06-01 – 2016-06-03 (×14): 10 mg via ORAL
  Filled 2016-06-01 (×13): qty 2

## 2016-06-01 MED ORDER — HYDROCHLOROTHIAZIDE 12.5 MG PO CAPS
12.5000 mg | ORAL_CAPSULE | Freq: Every day | ORAL | Status: DC
  Administered 2016-06-01 – 2016-06-03 (×3): 12.5 mg via ORAL
  Filled 2016-06-01 (×3): qty 1

## 2016-06-01 MED ORDER — HYDROMORPHONE HCL 1 MG/ML IJ SOLN
INTRAMUSCULAR | Status: AC
  Filled 2016-06-01: qty 0.5

## 2016-06-01 MED ORDER — HYDROMORPHONE HCL 2 MG/ML IJ SOLN
0.5000 mg | INTRAMUSCULAR | Status: DC | PRN
  Administered 2016-06-01 – 2016-06-03 (×10): 0.5 mg via INTRAVENOUS
  Filled 2016-06-01 (×10): qty 1

## 2016-06-01 MED ORDER — ROPIVACAINE HCL 5 MG/ML IJ SOLN
INTRAMUSCULAR | Status: DC | PRN
  Administered 2016-06-01: 30 mg via PERINEURAL

## 2016-06-01 MED ORDER — TELMISARTAN-HCTZ 80-12.5 MG PO TABS: 1.0000 | ORAL_TABLET | Freq: Every day | ORAL | Status: DC

## 2016-06-01 MED ORDER — CEFAZOLIN SODIUM-DEXTROSE 2-4 GM/100ML-% IV SOLN
2.0000 g | INTRAVENOUS | Status: AC
  Administered 2016-06-01: 2 g via INTRAVENOUS
  Filled 2016-06-01: qty 100

## 2016-06-01 MED ORDER — CEFAZOLIN SODIUM-DEXTROSE 2-4 GM/100ML-% IV SOLN
2.0000 g | Freq: Four times a day (QID) | INTRAVENOUS | Status: AC
  Administered 2016-06-01 (×2): 2 g via INTRAVENOUS
  Filled 2016-06-01 (×2): qty 100

## 2016-06-01 MED ORDER — PROPOFOL 500 MG/50ML IV EMUL
INTRAVENOUS | Status: DC | PRN
  Administered 2016-06-01: 75 ug/kg/min via INTRAVENOUS

## 2016-06-01 MED ORDER — B COMPLEX-C PO TABS
1.0000 | ORAL_TABLET | Freq: Every day | ORAL | Status: DC
Start: 2016-06-01 — End: 2016-06-03
  Administered 2016-06-01 – 2016-06-03 (×3): 1 via ORAL
  Filled 2016-06-01 (×3): qty 1

## 2016-06-01 MED ORDER — TRANEXAMIC ACID 1000 MG/10ML IV SOLN
INTRAVENOUS | Status: DC | PRN
  Administered 2016-06-01: 2000 mg via TOPICAL

## 2016-06-01 MED ORDER — METOCLOPRAMIDE HCL 5 MG PO TABS: 5.0000 mg | ORAL_TABLET | Freq: Three times a day (TID) | ORAL | Status: DC | PRN

## 2016-06-01 MED ORDER — LACTATED RINGERS IV SOLN
INTRAVENOUS | Status: DC | PRN
  Administered 2016-06-01 (×2): via INTRAVENOUS

## 2016-06-01 MED ORDER — HYDROMORPHONE HCL 1 MG/ML IJ SOLN
0.2500 mg | INTRAMUSCULAR | Status: DC | PRN
  Administered 2016-06-01 (×2): 0.5 mg via INTRAVENOUS

## 2016-06-01 MED ORDER — BUPIVACAINE-EPINEPHRINE 0.25% -1:200000 IJ SOLN
INTRAMUSCULAR | Status: DC | PRN
  Administered 2016-06-01: 30 mL

## 2016-06-01 MED ORDER — ACETAMINOPHEN 10 MG/ML IV SOLN
1000.0000 mg | INTRAVENOUS | Status: DC
  Filled 2016-06-01: qty 100

## 2016-06-01 MED ORDER — IRBESARTAN 300 MG PO TABS
300.0000 mg | ORAL_TABLET | Freq: Every day | ORAL | Status: DC
  Administered 2016-06-01 – 2016-06-03 (×3): 300 mg via ORAL
  Filled 2016-06-01 (×3): qty 1

## 2016-06-01 MED ORDER — METHOCARBAMOL 500 MG PO TABS
ORAL_TABLET | ORAL | Status: AC
  Filled 2016-06-01: qty 1

## 2016-06-01 MED ORDER — METHOCARBAMOL 1000 MG/10ML IJ SOLN
500.0000 mg | Freq: Four times a day (QID) | INTRAVENOUS | Status: DC | PRN
  Filled 2016-06-01: qty 5

## 2016-06-01 MED ORDER — SODIUM CHLORIDE 0.9 % IV SOLN: INTRAVENOUS | Status: DC

## 2016-06-01 MED ORDER — KETOROLAC TROMETHAMINE 15 MG/ML IJ SOLN
INTRAMUSCULAR | Status: AC
  Filled 2016-06-01: qty 1

## 2016-06-01 MED ORDER — SODIUM CHLORIDE 0.9 % IR SOLN
Status: DC | PRN
  Administered 2016-06-01: 3000 mL

## 2016-06-01 MED ORDER — BUPIVACAINE-EPINEPHRINE (PF) 0.25% -1:200000 IJ SOLN
INTRAMUSCULAR | Status: AC
  Filled 2016-06-01: qty 30

## 2016-06-01 SURGICAL SUPPLY — 65 items
BAG DECANTER FOR FLEXI CONT (MISCELLANEOUS) ×2 IMPLANT
BANDAGE ESMARK 6X9 LF (GAUZE/BANDAGES/DRESSINGS) ×1 IMPLANT
BLADE SAGITTAL 25.0X1.19X90 (BLADE) ×2 IMPLANT
BNDG CMPR 9X6 STRL LF SNTH (GAUZE/BANDAGES/DRESSINGS) ×1
BNDG ESMARK 6X9 LF (GAUZE/BANDAGES/DRESSINGS) ×2
BOWL SMART MIX CTS (DISPOSABLE) ×2 IMPLANT
CAP KNEE TOTAL 3 SIGMA ×1 IMPLANT
CEMENT HV SMART SET (Cement) ×5 IMPLANT
COVER SURGICAL LIGHT HANDLE (MISCELLANEOUS) ×2 IMPLANT
CUFF TOURNIQUET SINGLE 34IN LL (TOURNIQUET CUFF) ×1 IMPLANT
CUFF TOURNIQUET SINGLE 44IN (TOURNIQUET CUFF) IMPLANT
DECANTER SPIKE VIAL GLASS SM (MISCELLANEOUS) ×1 IMPLANT
DRAPE EXTREMITY T 121X128X90 (DRAPE) IMPLANT
DRAPE HALF SHEET 40X57 (DRAPES) ×2 IMPLANT
DRAPE PROXIMA HALF (DRAPES) ×2 IMPLANT
DRSG ADAPTIC 3X8 NADH LF (GAUZE/BANDAGES/DRESSINGS) ×2 IMPLANT
DRSG PAD ABDOMINAL 8X10 ST (GAUZE/BANDAGES/DRESSINGS) ×3 IMPLANT
DURAPREP 26ML APPLICATOR (WOUND CARE) ×4 IMPLANT
ELECT CAUTERY BLADE 6.4 (BLADE) ×2 IMPLANT
ELECT REM PT RETURN 9FT ADLT (ELECTROSURGICAL) ×2
ELECTRODE REM PT RTRN 9FT ADLT (ELECTROSURGICAL) ×1 IMPLANT
EVACUATOR 1/8 PVC DRAIN (DRAIN) ×1 IMPLANT
FACESHIELD WRAPAROUND (MASK) ×4 IMPLANT
FACESHIELD WRAPAROUND OR TEAM (MASK) ×2 IMPLANT
GAUZE SPONGE 4X4 12PLY STRL (GAUZE/BANDAGES/DRESSINGS) ×2 IMPLANT
GLOVE BIOGEL PI IND STRL 8 (GLOVE) ×1 IMPLANT
GLOVE BIOGEL PI IND STRL 8.5 (GLOVE) ×1 IMPLANT
GLOVE BIOGEL PI INDICATOR 8 (GLOVE) ×1
GLOVE BIOGEL PI INDICATOR 8.5 (GLOVE) ×1
GLOVE ECLIPSE 8.0 STRL XLNG CF (GLOVE) ×4 IMPLANT
GLOVE SURG ORTHO 8.5 STRL (GLOVE) ×4 IMPLANT
GOWN STRL REUS W/ TWL LRG LVL3 (GOWN DISPOSABLE) ×2 IMPLANT
GOWN STRL REUS W/TWL 2XL LVL3 (GOWN DISPOSABLE) ×2 IMPLANT
GOWN STRL REUS W/TWL LRG LVL3 (GOWN DISPOSABLE) ×4
HANDPIECE INTERPULSE COAX TIP (DISPOSABLE) ×2
IV NS IRRIG 3000ML ARTHROMATIC (IV SOLUTION) ×1 IMPLANT
KIT BASIN OR (CUSTOM PROCEDURE TRAY) ×2 IMPLANT
KIT ROOM TURNOVER OR (KITS) ×2 IMPLANT
MANIFOLD NEPTUNE II (INSTRUMENTS) ×2 IMPLANT
NEEDLE 22X1 1/2 (OR ONLY) (NEEDLE) ×2 IMPLANT
NS IRRIG 1000ML POUR BTL (IV SOLUTION) ×2 IMPLANT
PACK TOTAL JOINT (CUSTOM PROCEDURE TRAY) ×2 IMPLANT
PAD ARMBOARD 7.5X6 YLW CONV (MISCELLANEOUS) ×4 IMPLANT
PAD CAST 4YDX4 CTTN HI CHSV (CAST SUPPLIES) ×1 IMPLANT
PADDING CAST ABS 6INX4YD NS (CAST SUPPLIES) ×1
PADDING CAST ABS COTTON 6X4 NS (CAST SUPPLIES) IMPLANT
PADDING CAST COTTON 4X4 STRL (CAST SUPPLIES) ×2
PADDING CAST COTTON 6X4 STRL (CAST SUPPLIES) ×2 IMPLANT
SET HNDPC FAN SPRY TIP SCT (DISPOSABLE) ×1 IMPLANT
SPONGE GAUZE 4X4 12PLY STER LF (GAUZE/BANDAGES/DRESSINGS) ×1 IMPLANT
STAPLER VISISTAT 35W (STAPLE) ×2 IMPLANT
SUCTION FRAZIER HANDLE 10FR (MISCELLANEOUS) ×1
SUCTION TUBE FRAZIER 10FR DISP (MISCELLANEOUS) ×1 IMPLANT
SURGIFLO W/THROMBIN 8M KIT (HEMOSTASIS) IMPLANT
SUT BONE WAX W31G (SUTURE) ×2 IMPLANT
SUT ETHIBOND NAB CT1 #1 30IN (SUTURE) ×4 IMPLANT
SUT MNCRL AB 3-0 PS2 18 (SUTURE) ×2 IMPLANT
SUT VIC AB 0 CT1 27 (SUTURE) ×2
SUT VIC AB 0 CT1 27XBRD ANBCTR (SUTURE) ×1 IMPLANT
SYR CONTROL 10ML LL (SYRINGE) IMPLANT
TOWEL OR 17X24 6PK STRL BLUE (TOWEL DISPOSABLE) ×2 IMPLANT
TOWEL OR 17X26 10 PK STRL BLUE (TOWEL DISPOSABLE) ×2 IMPLANT
TRAY FOLEY BAG SILVER LF 16FR (SET/KITS/TRAYS/PACK) ×2 IMPLANT
WATER STERILE IRR 1000ML POUR (IV SOLUTION) ×1 IMPLANT
WRAP KNEE MAXI GEL POST OP (GAUZE/BANDAGES/DRESSINGS) ×2 IMPLANT

## 2016-06-01 NOTE — Op Note (Signed)
PATIENT ID:      James Gallegos  MRN:     [id number] DOB/AGE:    67/14/1952 / 64 y.o.       OPERATIVE REPORT    DATE OF PROCEDURE:  06/01/2016       PREOPERATIVE DIAGNOSIS: END STAGE  RIGHT KNEE OSTEOARTHRITIS                                                       Estimated body mass index is 32.92 kg/m as calculated from the following:   Height as of 05/21/16: 5' 9.5" (1.765 m).   Weight as of 05/21/16: 226 lb 3.1 oz (102.6 kg).     POSTOPERATIVE DIAGNOSIS:END STAGE   RIGHT KNEE OSTEOARTHRITIS                                                                     Estimated body mass index is 32.92 kg/m as calculated from the following:   Height as of 05/21/16: 5' 9.5" (1.765 m).   Weight as of 05/21/16: 226 lb 3.1 oz (102.6 kg).     PROCEDURE:  Procedure(s):RIGHT TOTAL KNEE ARTHROPLASTY      SURGEON:  [name], MD    ASSISTANT:   Biagio Borg, PA-C   (Present and scrubbed throughout the case, critical for assistance with exposure, retraction, instrumentation, and closure.)          ANESTHESIA: regional and spinal     DRAINS: (RIGHT KNEE) Hemovact drain(s) in the CLAMPED with  Suction Clamped :      TOURNIQUET TIME:  Total Tourniquet Time Documented: Thigh (Right) - 76 minutes Total: Thigh (Right) - 76 minutes     COMPLICATIONS:  None   CONDITION:  stable  PROCEDURE IN DETAIL: 201001   James Gallegos 06/01/2016, 9:18 AM

## 2016-06-01 NOTE — Discharge Instructions (Signed)

## 2016-06-01 NOTE — Progress Notes (Signed)
Orthopedic Tech Progress Note Patient Details:  James Gallegos [birth year] [id number] Put on cpm at [place] Patient ID: [name], male   DOB: 1951-04-19, 65 y.o.   MRN: [id number]   Braulio Bosch 06/01/2016, 6:38 PM

## 2016-06-01 NOTE — Anesthesia Postprocedure Evaluation (Signed)
Anesthesia Post Note  Patient: James Gallegos  Procedure(s) Performed: Procedure(s) (LRB): TOTAL KNEE ARTHROPLASTY (Right)  Patient location during evaluation: PACU Anesthesia Type: Spinal and MAC Level of consciousness: awake and alert Pain management: pain level controlled Vital Signs Assessment: post-procedure vital signs reviewed and stable Respiratory status: spontaneous breathing and respiratory function stable Cardiovascular status: blood pressure returned to baseline and stable Postop Assessment: spinal receding Anesthetic complications: no       Last Vitals:  Vitals:   06/01/16 1000 06/01/16 1030  BP: 118/62 126/62  Pulse: (!) 59 (!) 55  Resp: 12 14  Temp:      Last Pain:  Vitals:   06/01/16 1030  TempSrc:   PainSc: 0-No pain                 Bailley Guilford DANIEL

## 2016-06-01 NOTE — Anesthesia Procedure Notes (Signed)
Spinal  Patient location during procedure: OR Start time: 06/01/2016 8:12 AM End time: 06/01/2016 8:25 AM Staffing Anesthesiologist: Duane Boston Performed: anesthesiologist  Preanesthetic Checklist Completed: patient identified, surgical consent, pre-op evaluation, timeout performed, IV checked, risks and benefits discussed and monitors and equipment checked Spinal Block Patient position: sitting Prep: DuraPrep Patient monitoring: cardiac monitor, continuous pulse ox and blood pressure Approach: midline Location: L2-3 Injection technique: single-shot Needle Needle type: Pencan  Needle gauge: 24 G Needle length: 9 cm Additional Notes Functioning IV was confirmed and monitors were applied. Sterile prep and drape, including hand hygiene and sterile gloves were used. The patient was positioned and the spine was prepped. The skin was anesthetized with lidocaine.  Free flow of clear CSF was obtained prior to injecting local anesthetic into the CSF.  The spinal needle aspirated freely following injection.  The needle was carefully withdrawn.  The patient tolerated the procedure well.

## 2016-06-01 NOTE — Evaluation (Signed)
Physical Therapy Evaluation Patient Details Name: James Gallegos MRN: [id number] DOB: 27-Jun-1950 Today's Date: 06/01/2016   History of Present Illness  Patient is a 65 y/o male s/p R TKA.  PMH sleep apnea, HTN, DVT, abdominal hernia.   Clinical Impression  Patient presents with decreased mobility due to deficits listed in PT problem list.  He will benefit from skilled PT in the acute setting to allow return home with family support and follow up HHPT.     Follow Up Recommendations Home health PT    Equipment Recommendations  Rolling walker with 5" wheels    Recommendations for Other Services       Precautions / Restrictions Precautions Precautions: Knee Restrictions Weight Bearing Restrictions: Yes RLE Weight Bearing: Partial weight bearing RLE Partial Weight Bearing Percentage or Pounds: 50%      Mobility  Bed Mobility Overal bed mobility: Needs Assistance Bed Mobility: Supine to Sit     Supine to sit: Min guard;HOB elevated     General bed mobility comments: for trunk elevation with use of rail  Transfers Overall transfer level: Needs assistance Equipment used: Rolling walker (2 wheeled) Transfers: Sit to/from Stand Sit to Stand: Min assist         General transfer comment: cues for technique, PWB and hand placement  Ambulation/Gait Ambulation/Gait assistance: Min guard Ambulation Distance (Feet): 90 Feet Assistive device: Rolling walker (2 wheeled) Gait Pattern/deviations: Step-to pattern;Decreased stride length     General Gait Details: cues for PWB throughout, shortened walker for height and encouraged heel up for keeping weight off R LE.   Stairs            Wheelchair Mobility    Modified Rankin (Stroke Patients Only)       Balance Overall balance assessment: Needs assistance   Sitting balance-Leahy Scale: Good     Standing balance support: Bilateral upper extremity supported Standing balance-Leahy Scale: Poor Standing balance  comment: UE support needed due to [facility]                             Pertinent Vitals/Pain Pain Assessment: 0-10 Pain Score: 5  Pain Location: R knee Pain Descriptors / Indicators: Aching Pain Intervention(s): Monitored during session    Home Living Family/patient expects to be discharged to:: Private residence Living Arrangements: Spouse/significant other Available Help at Discharge: Family Type of Home: House Home Access: Stairs to enter Entrance Stairs-Rails: None Technical brewer of Steps: 3 Home Layout: Two level Home Equipment: None      Prior Function Level of Independence: Independent               Hand Dominance        Extremity/Trunk Assessment   Upper Extremity Assessment Upper Extremity Assessment: Overall WFL for tasks assessed    Lower Extremity Assessment Lower Extremity Assessment: RLE deficits/detail RLE Deficits / Details: AAROM grossly 0-65; positive SLR with lag       Communication   Communication: No difficulties  Cognition Arousal/Alertness: Awake/alert Behavior During Therapy: WFL for tasks assessed/performed Overall Cognitive Status: Within Functional Limits for tasks assessed                      General Comments      Exercises Total Joint Exercises Ankle Circles/Pumps: AROM;10 reps;Both;Supine Quad Sets: AROM;Right;10 reps;Supine Short Arc Quad: AROM;Right;10 reps;Supine Heel Slides: AROM;Both;10 reps;Supine Hip ABduction/ADduction: AROM;Right;10 reps;Supine   Assessment/Plan    PT Assessment Patient  needs continued PT services  PT Problem List Decreased strength;Decreased balance;Decreased range of motion;Decreased mobility;Decreased knowledge of use of DME;Decreased knowledge of precautions;Pain          PT Treatment Interventions DME instruction;Gait training;Functional mobility training;Stair training;Balance training;Therapeutic exercise;Therapeutic activities;Patient/family education     PT Goals (Current goals can be found in the Care Plan section)  Acute Rehab PT Goals Patient Stated Goal: To go home PT Goal Formulation: With patient/family Time For Goal Achievement: 06/04/16 Potential to Achieve Goals: Good    Frequency 7X/week   Barriers to discharge        Co-evaluation               End of Session Equipment Utilized During Treatment: Gait belt Activity Tolerance: Patient tolerated treatment well Patient left: in chair;with call bell/phone within reach;with family/visitor present           Time: [id number] PT Time Calculation (min) (ACUTE ONLY): 25 min   Charges:   PT Evaluation $PT Eval Low Complexity: 1 Procedure PT Treatments $Gait Training: 8-22 mins   PT G CodesReginia Naas Jun 18, 2016, 4:54 PM  Magda Kiel, [place] 06/18/2016

## 2016-06-01 NOTE — Transfer of Care (Signed)
Immediate Anesthesia Transfer of Care Note  Patient: James Gallegos  Procedure(s) Performed: Procedure(s): TOTAL KNEE ARTHROPLASTY (Right)  Patient Location: PACU  Anesthesia Type:MAC and Spinal  Level of Consciousness: awake, alert , oriented and patient cooperative  Airway & Oxygen Therapy: Patient Spontanous Breathing and Patient connected to nasal cannula oxygen  Post-op Assessment: Report given to RN and Post -op Vital signs reviewed and stable  Post vital signs: Reviewed and stable  Last Vitals:  Vitals:   06/01/16 0605  BP: (!) 145/64  Pulse: 66  Resp: 18  Temp: 37.7 C    Last Pain:  Vitals:   06/01/16 0605  TempSrc: Oral  PainSc:       Patients Stated Pain Goal: 6 ([id number])  Complications: No apparent anesthesia complications

## 2016-06-01 NOTE — Anesthesia Procedure Notes (Signed)
Anesthesia Regional Block:  Adductor canal block  Pre-Anesthetic Checklist: ,, timeout performed, Correct Patient, Correct Site, Correct Laterality, Correct Procedure, Correct Position, site marked, Risks and benefits discussed,  Surgical consent,  Pre-op evaluation,  At surgeon's request and post-op pain management  Laterality: Right  Prep: chloraprep       Needles:  Injection technique: Single-shot  Needle Type: Stimulator Needle - 80     Needle Length: 10cm 10 cm Needle Gauge: 21 and 21 G    Additional Needles:  Procedures: ultrasound guided (picture in chart) Adductor canal block Narrative:  Start time: 06/01/2016 6:57 AM End time: 06/01/2016 7:07 AM Injection made incrementally with aspirations every 5 mL.  Performed by: Personally

## 2016-06-01 NOTE — H&P (Signed)
The recent History & Physical has been reviewed. I have personally examined the patient today. There is no interval change to the documented History & Physical. The patient would like to proceed with the procedure.  [name] 06/01/2016,  7:10 AM

## 2016-06-01 NOTE — Progress Notes (Signed)
Orthopedic Tech Progress Note Patient Details:  James Gallegos 1950/10/11 [id number]  CPM Right Knee CPM Right Knee: On Right Knee Flexion (Degrees): 90 Right Knee Extension (Degrees): 0 Additional Comments: trapeze bar patient helper   Hildred Priest 06/01/2016, 10:31 AM Viewed order from doctor's order  list

## 2016-06-02 ENCOUNTER — Encounter (HOSPITAL_COMMUNITY): Payer: Self-pay | Admitting: Orthopaedic Surgery

## 2016-06-02 LAB — CBC
HCT: 35.1 % — ABNORMAL LOW (ref 39.0–52.0)
Hemoglobin: 12.3 g/dL — ABNORMAL LOW (ref 13.0–17.0)
MCH: 32.4 pg (ref 26.0–34.0)
MCHC: 35 g/dL (ref 30.0–36.0)
MCV: 92.4 fL (ref 78.0–100.0)
Platelets: 143 10*3/uL — ABNORMAL LOW (ref 150–400)
RBC: 3.8 MIL/uL — ABNORMAL LOW (ref 4.22–5.81)
RDW: 12.5 % (ref 11.5–15.5)
WBC: 8.1 10*3/uL (ref 4.0–10.5)

## 2016-06-02 LAB — BASIC METABOLIC PANEL
Anion gap: 8 (ref 5–15)
BUN: 25 mg/dL — AB (ref 6–20)
CALCIUM: 8.5 mg/dL — AB (ref 8.9–10.3)
CO2: 23 mmol/L (ref 22–32)
CREATININE: 1.12 mg/dL (ref 0.61–1.24)
Chloride: 104 mmol/L (ref 101–111)
GFR calc non Af Amer: >60 mL/min (ref >60)
Glucose, Bld: 138 mg/dL — ABNORMAL HIGH (ref 65–99)
Potassium: 3.8 mmol/L (ref 3.5–5.1)
SODIUM: 135 mmol/L (ref 135–145)

## 2016-06-02 MED ORDER — ACETAMINOPHEN 500 MG PO TABS
1000.0000 mg | ORAL_TABLET | Freq: Four times a day (QID) | ORAL | Status: DC
  Administered 2016-06-02 – 2016-06-03 (×3): 1000 mg via ORAL
  Filled 2016-06-02 (×3): qty 2

## 2016-06-02 NOTE — Progress Notes (Signed)
Physical Therapy Treatment Patient Details Name: James Gallegos MRN: [id number] DOB: Apr 23, 1951 Today's Date: 06/02/2016    History of Present Illness Patient is a 65 y/o male s/p R TKA.  PMH sleep apnea, HTN, DVT, abdominal hernia.     PT Comments    Patient is progressing toward mobility goals. Tolerated increase in gait distance and stair training this session. Continue to progress as tolerated with anticipated d/c home with HHPT.   Follow Up Recommendations  Home health PT     Equipment Recommendations  Rolling walker with 5" wheels    Recommendations for Other Services       Precautions / Restrictions Precautions Precautions: Knee Precaution Comments: reviewed precautions Restrictions Weight Bearing Restrictions: Yes RLE Weight Bearing: Partial weight bearing RLE Partial Weight Bearing Percentage or Pounds: 50%    Mobility  Bed Mobility Overal bed mobility: Modified Independent Bed Mobility: Supine to Sit           General bed mobility comments: increased time/effort  Transfers Overall transfer level: Needs assistance Equipment used: Rolling walker (2 wheeled) Transfers: Sit to/from Stand Sit to Stand: Min guard         General transfer comment: min guard for safety  Ambulation/Gait Ambulation/Gait assistance: Min guard Ambulation Distance (Feet): 125 Feet Assistive device: Rolling walker (2 wheeled) Gait Pattern/deviations: Step-to pattern;Decreased stride length     General Gait Details: pt maintained PWB throughout session; cues for posture and sequencing   Stairs Stairs: Yes   Stair Management: No rails;Backwards;With walker Number of Stairs: 2 General stair comments: cues for sequencing and technique; assist to stabilize RW  Wheelchair Mobility    Modified Rankin (Stroke Patients Only)       Balance     Sitting balance-Leahy Scale: Good       Standing balance-Leahy Scale: Poor Standing balance comment: UE support  needed due to [facility]                    Cognition Arousal/Alertness: Awake/alert Behavior During Therapy: WFL for tasks assessed/performed Overall Cognitive Status: Within Functional Limits for tasks assessed                      Exercises Total Joint Exercises Quad Sets: AROM;Right;10 reps Heel Slides: AROM;Right;10 reps Straight Leg Raises: AROM;Right;10 reps Goniometric ROM: ~80 degrees flexion    General Comments        Pertinent Vitals/Pain Pain Assessment: 0-10 Pain Score: 7  Pain Location: R knee and thigh Pain Descriptors / Indicators: Aching Pain Intervention(s): Limited activity within patient's tolerance;Monitored during session;Repositioned;Patient requesting pain meds-RN notified;Ice applied    Home Living                      Prior Function            PT Goals (current goals can now be found in the care plan section) Acute Rehab PT Goals Patient Stated Goal: To go home Progress towards PT goals: Progressing toward goals    Frequency    7X/week      PT Plan Current plan remains appropriate    Co-evaluation             End of Session Equipment Utilized During Treatment: Gait belt Activity Tolerance: Patient tolerated treatment well Patient left: in chair;with call bell/phone within reach;with family/visitor present;with nursing/sitter in room     Time: [id number] PT Time Calculation (min) (ACUTE ONLY): 39 min  Charges:  $Gait  Training: 8-22 mins $Therapeutic Exercise: 8-22 mins $Therapeutic Activity: 8-22 mins                    G Codes:      Salina April, PTA Pager: [phone]   06/02/2016, 10:33 AM

## 2016-06-02 NOTE — Progress Notes (Signed)
Physical Therapy Treatment Patient Details Name: James Gallegos MRN: [id number] DOB: [birth year] Today's Date: 06/02/2016    History of Present Illness Patient is a 65 y/o male s/p R TKA.  PMH sleep apnea, HTN, DVT, abdominal hernia.     PT Comments    Patient continues to do well with mobility despite c/o pain. Current plan remains appropriate.   Follow Up Recommendations  Home health PT     Equipment Recommendations  Rolling walker with 5" wheels    Recommendations for Other Services       Precautions / Restrictions Precautions Precautions: Knee Precaution Booklet Issued: No Precaution Comments: reviewed precautions Restrictions Weight Bearing Restrictions: Yes RLE Weight Bearing: Partial weight bearing RLE Partial Weight Bearing Percentage or Pounds: 50%    Mobility  Bed Mobility Overal bed mobility: Modified Independent Bed Mobility: Supine to Sit           General bed mobility comments: increased time/effort  Transfers Overall transfer level: Needs assistance Equipment used: Rolling walker (2 wheeled) Transfers: Sit to/from Stand Sit to Stand: Supervision         General transfer comment: supervision for safety; carry over of safe hand placement and technique  Ambulation/Gait Ambulation/Gait assistance: Min guard Ambulation Distance (Feet): 150 Feet Assistive device: Rolling walker (2 wheeled) Gait Pattern/deviations: Step-to pattern;Decreased stride length Gait velocity: decreased   General Gait Details: cues for cadence and posture   Stairs            Wheelchair Mobility    Modified Rankin (Stroke Patients Only)       Balance     Sitting balance-Leahy Scale: Good       Standing balance-Leahy Scale: Poor Standing balance comment: UE support needed due to [facility]                    Cognition Arousal/Alertness: Awake/alert Behavior During Therapy: WFL for tasks assessed/performed Overall Cognitive Status: Within  Functional Limits for tasks assessed                      Exercises Total Joint Exercises Long Arc Quad: AROM;Right;5 reps;Seated    General Comments        Pertinent Vitals/Pain Pain Assessment: 0-10 Pain Score: 8  Pain Location: R knee and thigh (mainly thigh) Pain Descriptors / Indicators: Aching;Sore Pain Intervention(s): Limited activity within patient's tolerance;Monitored during session;Repositioned;RN gave pain meds during session;Ice applied (pt reported decreased pain with mobility)    Home Living Family/patient expects to be discharged to:: Private residence Living Arrangements: Spouse/significant other Available Help at Discharge: Available 24 hours/day;Family Type of Home: House Home Access: Stairs to enter Entrance Stairs-Rails: None Home Layout: One level Home Equipment: Hand held shower head      Prior Function Level of Independence: Independent      Comments: working as a Clinical cytogeneticist, driving, sings and plays guitar   PT Goals (current goals can now be found in the care plan section) Acute Rehab PT Goals Patient Stated Goal: To go home Progress towards PT goals: Progressing toward goals    Frequency    7X/week      PT Plan Current plan remains appropriate    Co-evaluation             End of Session Equipment Utilized During Treatment: Gait belt Activity Tolerance: Patient tolerated treatment well Patient left: in chair;with call bell/phone within reach;with family/visitor present     Time: [id number] PT Time Calculation (min) (  ACUTE ONLY): 27 min  Charges:  $Gait Training: 8-22 mins $Therapeutic Activity: 8-22 mins                    G Codes:      Salina April, PTA Pager: [phone]   06/02/2016, 4:47 PM

## 2016-06-02 NOTE — Op Note (Signed)
PATIENT ID: James Gallegos        MRN:  [id number]          DOB/AGE: 67-Oct-1952 / 67 y.o.    James Fears, MD   James Borg, PA-C [address] [place], [place]  [address]                             [phone]   PROGRESS NOTE  Subjective:  negative for Chest Pain  negative for Shortness of Breath  negative for Nausea/Vomiting   negative for Calf Pain    Tolerating Diet: yes         Patient reports pain as mild.     Comfortable with meds  Objective: Vital signs in last 24 hours:   Patient Vitals for the past 24 hrs:  BP Temp Temp src Pulse Resp SpO2  06/02/16 0613 (!) 136/54 (!) 100.6 F (38.1 C) Oral 65 16 98 %  06/02/16 0108 (!) 115/45 99.5 F (37.5 C) Oral 63 16 98 %  06/01/16 2325 - - - - 16 -  06/01/16 2026 (!) 130/57 98.9 F (37.2 C) Oral (!) 59 16 98 %  06/01/16 1423 130/63 98.2 F (36.8 C) Oral (!) 58 - -  06/01/16 1315 (!) 142/68 97.8 F (36.6 C) - 60 15 99 %  06/01/16 1245 (!) 147/70 98 F (36.7 C) - (!) 57 14 99 %  06/01/16 1230 130/74 - - (!) 56 12 99 %  06/01/16 1215 124/68 - - (!) 59 (!) 9 99 %  06/01/16 1200 124/66 - - (!) 58 11 100 %  06/01/16 1145 131/66 - - (!) 57 11 100 %  06/01/16 1130 134/62 - - 68 17 100 %  06/01/16 1115 138/71 - - - - -  06/01/16 1100 128/68 - - (!) 54 (!) 9 99 %  06/01/16 1045 127/68 - - (!) 52 10 100 %  06/01/16 1030 126/62 - - (!) 55 14 100 %  06/01/16 1015 121/64 - - (!) 56 11 100 %  06/01/16 1000 118/62 - - (!) 59 12 100 %  06/01/16 0945 (!) 141/67 - - 62 12 100 %  06/01/16 0937 135/65 97.6 F (36.4 C) - 66 14 99 %      Intake/Output from previous day:   12/19 0701 - 12/20 0700 In: 1806.3 [P.O.:280; I.V.:1326.3] Out: 750 [Urine:550; Drains:150]   Intake/Output this shift:   No intake/output data recorded.   Intake/Output      12/19 0701 - 12/20 0700 12/20 0701 - 12/21 0700   P.O. 280    I.V. 1326.3    IV Piggyback 200    Total Intake 1806.3     Urine 550    Drains 150    Blood 50    Total  Output 750     Net +1056.3             LABORATORY DATA: No results for input(s): WBC, HGB, HCT, PLT in the last 168 hours. No results for input(s): NA, K, CL, CO2, BUN, CREATININE, GLUCOSE, CALCIUM in the last 168 hours. Lab Results  Component Value Date   INR 0.94 05/21/2016    Recent Radiographic Studies :  Dg Chest 2 View  Result Date: 05/21/2016 CLINICAL DATA:  Total knee replacement. EXAM: CHEST  2 VIEW COMPARISON:  No recent prior. FINDINGS: Cardiomegaly is noted. Mild pulmonary vascular prominence. Low lung volumes. No pleural effusion or pneumothorax.  No acute bony abnormality. Degenerative changes thoracic spine . IMPRESSION: 1. Cardiomegaly.  Mild pulmonary vascular prominence. 2. Low lung volumes. Electronically Signed   By: Marcello Moores  Register   On: 05/21/2016 09:35     Examination:  General appearance: alert, cooperative and no distress  Wound Exam: clean, dry, intact   Drainage:  Scant/small amount Serosanguinous exudate in hemovac  Motor Exam: EHL, FHL, Anterior Tibial and Posterior Tibial Intact  Sensory Exam: Superficial Peroneal, Deep Peroneal and Tibial normal  Vascular Exam: Normal  Assessment:    1 Day Post-Op  Procedure(s) (LRB): TOTAL KNEE ARTHROPLASTY (Right)  ADDITIONAL DIAGNOSIS:  Principal Problem:   Unilateral primary osteoarthritis, right knee Active Problems:   S/P total knee replacement using cement, right     Plan: Physical Therapy as ordered   DVT Prophylaxis:  Xarelto, Foot Pumps and TED hose  DISCHARGE PLAN: Home  DISCHARGE NEEDS: HHPT, CPM, Walker and 3-in-1 comode seat   voiding without problem, tolerating diet, minimal drainage in hemovac-removed.OOB with PT, saline lock IV, hope for D/C in am    James Gallegos  06/02/2016 7:53 AM

## 2016-06-02 NOTE — Plan of Care (Signed)
Problem: Bowel/Gastric: Goal: Will not experience complications related to bowel motility Outcome: Progressing Denies gastric and bowel issues  Problem: Pain Management: Goal: Pain level will decrease with appropriate interventions Outcome: Progressing Medicated for pain with mild relief

## 2016-06-02 NOTE — Op Note (Signed)
NAME:  James Gallegos, James Gallegos                  ACCOUNT NO.:  MEDICAL RECORD NO.:  [id number]  LOCATION:                                 FACILITY:  PHYSICIAN:  Vonna Kotyk. Durward Fortes, M.D.    DATE OF BIRTH:  DATE OF PROCEDURE:  06/01/2016 DATE OF DISCHARGE:                              OPERATIVE REPORT   PREOPERATIVE DIAGNOSIS:  End-stage osteoarthritis, right knee.  POSTOPERATIVE DIAGNOSIS:  End-stage osteoarthritis, right knee.  PROCEDURE:  Right total knee replacement.  SURGEON:  Vonna Kotyk. Durward Fortes, M.D.  ASSISTANT:  Biagio Borg, PA-C.  ANESTHESIA:  Spinal with supplemental adductor canal block.  COMPLICATIONS:  None.  COMPONENTS:  DePuy LCS large femoral component #4 rotating keeled tibial tray, a 10-mm polyethylene bridging bearing, a metal backed 3-peg rotating patella.  All components were secured with polymethyl methacrylate.  DESCRIPTION OF PROCEDURE:  James Gallegos was met in the holding area, identified the right knee as appropriate operative site and marked it accordingly.  Anesthesia performed an adductor canal block.  The patient was then transported to room #7.  Spinal anesthesia was performed by Anesthesia.  The patient was given IV sedation and placed comfortably in the supine position.  The right lower extremity was then placed in a thigh tourniquet.  The right lower extremity was prepped with chlorhexidine scrub and DuraPrep x2 from the tourniquet to the tips of the toes.  Sterile draping was performed.  Time-out was called.  Right lower extremity was then elevated and Esmarch exsanguinated with a proximal tourniquet at 350 mmHg.  A midline longitudinal incision was made centered about the patella extending from the superior pouch to tibial tubercle.  Via sharp dissection, the incision was carried down to subcutaneous tissue.  First layer of capsule was incised in midline and medial parapatellar incision was then made through the deep capsule.  The  joint was entered.  There was minimal clear yellow joint effusion.  The patella was everted to 180 degrees and knee flexed to 90 degrees.  There were large osteophytes along the medial and lateral femoral condyle.  These were removed and measured a large femoral component.  There was moderate amount of beefy red synovitis and synovectomy was performed.  First bony cut was made transversely with a 7-degree angle of declination on the tibia using the external tibial guide.  Subsequent cuts were then made on the femur using the large femoral jig.  Flexion and extension gaps were perfectly symmetrical at 10 mm.  Lamina spreaders were placed along the medial and lateral compartments.  I removed the anterior and posterior cruciate ligaments, medial and lateral menisci.  I also removed osteophytes in the posterior femoral condyle using a 3/4-inch curved osteotome.  There was a minimal Baker cyst identified posteromedially.  Visual portion was excised.  No loose bodies were identified.  Again, I used a 4-degree distal femoral valgus cut and again checked flexion and extension gaps that were symmetrical at 10 mm.  The finishing guide was applied to the femur for tapering purposes.  Retractor was then placed around the tibia, was advanced anteriorly and measured with a #4 tibial tray.  The tibial tray was then pinned  in place.  Center hole was made followed by the keeled cut.  The tibial jig in place, the 10-mm polyethylene bridging bearing was applied, followed by the trial large femoral component.  The entire construct was reduced and through a full range of motion, there was no instability with varus or valgus stress.  MCL and LCL remained intact at full extension and flexed well beyond 115 degrees without any malrotation of the components.  The patella was then prepared by removing 12 mm of bone leaving 13 mm patella thickness.  Three pegs were then made with a drill and the  trial patella inserted through a full range of motion and remained perfectly stable.  Trial components were removed.  The joint was copiously irrigated with saline solution.  Final components were then impacted with polymethyl methacrylate.  We initially applied a #4 tibial tray followed by the 10-mm polyethylene bridging bearing in the large femoral component.  The components were impacted.  Any extraneous methacrylate was removed from the periphery.  Patella was applied with methacrylate and a clamp.  While waiting for the methacrylate to mature, the joint was irrigated with saline solution and then infiltrated with 0.25% Marcaine without epinephrine.  Approximately, 16 minutes of methacrylate had matured. The tourniquet was deflated.  We had nice capillary refill to the joint surface.  Tranexamic acid was applied topically.  We felt we had a nice dry field.  Bone wax was applied to any bleeding bone.  The deep capsule was then closed with a running #1 Ethibond, superficial capsule with running 0 Vicryl and subcu with 3-0 Monocryl, skin closed with skin clips.  Sterile bulky dressing was applied, followed by the patient's support stocking.  We did insert a medium size Hemovac that would be clamped for several hours postop.  The patient returned to the postanesthesia recovery room in satisfactory condition.     Vonna Kotyk. Durward Fortes, M.D.     PWW/MEDQ  D:  06/01/2016  T:  06/02/2016  Job:  [id number]

## 2016-06-02 NOTE — Evaluation (Signed)
Occupational Therapy Evaluation Patient Details Name: James Gallegos MRN: [id number] DOB: December 13, 1950 Today's Date: 06/02/2016    History of Present Illness Patient is a 65 y/o male s/p R TKA.  PMH sleep apnea, HTN, DVT, abdominal hernia.    Clinical Impression   PTA Pt independent in ADL/IADL and mobility. Pt currently mod assist for LB ADL for bed level eval this session. Pt requested to stay in CPM and also stated severe pain. Pt will benefit from skilled OT in the acute care setting prior to dc to venue below to maximize independence and safety with ADL. Next session to focus on AE practice with grabber/reacher and standing grooming. (also show long handled sponge.)    Follow Up Recommendations  No OT follow up;Supervision - Intermittent    Equipment Recommendations  3 in 1 bedside commode    Recommendations for Other Services       Precautions / Restrictions Precautions Precautions: Knee Precaution Booklet Issued: No Precaution Comments: reviewed precautions Restrictions Weight Bearing Restrictions: Yes RLE Weight Bearing: Partial weight bearing RLE Partial Weight Bearing Percentage or Pounds: 50%      Mobility Bed Mobility Overal bed mobility:  (not attempted this session due to CPM and Pain)                Transfers                 General transfer comment: not attempted this session due to being in the CPM and pain    Balance                                            ADL Overall ADL's : Needs assistance/impaired Eating/Feeding: Modified independent;Sitting   Grooming: Wash/dry face;Brushing hair;Bed level;Supervision/safety   Upper Body Bathing: Supervision/ safety;Sitting;With caregiver independent assisting Upper Body Bathing Details (indicate cue type and reason): Pt edcuated on long handle sponge to assist in bathing Lower Body Bathing: Moderate assistance;With caregiver independent assisting;Sitting/lateral  leans   Upper Body Dressing : Modified independent;Sitting   Lower Body Dressing: Moderate assistance;With caregiver independent assisting;Sit to/from stand Lower Body Dressing Details (indicate cue type and reason): educated in sequence for dressing (operated leg first), and educated about grabber/reacher option             Functional mobility during ADLs:  (not attempted this session due to CPM and pain) General ADL Comments: Pt limited to bed level eval due to pain, and being in CPM this session, Talked at length with Pt and wife and Pt states that he feels better standing than when he is laying down. Pt also ambulated twice with PT today, and did stairs. Pt's wife is capable and willing to provide level of support needed for LB dressing/bathing. Pt very motivated to be independent     Estate agent      Pertinent Vitals/Pain Pain Assessment: 0-10 Pain Score: 8  Pain Location: R knee and thigh Pain Descriptors / Indicators: Aching Pain Intervention(s): Limited activity within patient's tolerance;Ice applied;Other (comment) (CPM)     Hand Dominance Right   Extremity/Trunk Assessment Upper Extremity Assessment Upper Extremity Assessment: Overall WFL for tasks assessed   Lower Extremity Assessment Lower Extremity Assessment: RLE deficits/detail RLE Deficits / Details: deficits in ROM and strength post-op RLE: Unable to fully assess due to pain  Cervical / Trunk Assessment Cervical / Trunk Assessment: Normal   Communication Communication Communication: No difficulties   Cognition Arousal/Alertness: Awake/alert Behavior During Therapy: WFL for tasks assessed/performed Overall Cognitive Status: Within Functional Limits for tasks assessed                     General Comments       Exercises       Shoulder Instructions      Home Living Family/patient expects to be discharged to:: Private residence Living Arrangements:  Spouse/significant other Available Help at Discharge: Available 24 hours/day;Family Type of Home: House Home Access: Stairs to enter [employer] of Steps: 3 Entrance Stairs-Rails: None Home Layout: One level Alternate Level Stairs-Number of Steps: 1 sunken living room Alternate Level Stairs-Rails: None Bathroom Shower/Tub: Occupational psychologist: Handicapped height Bathroom Accessibility: Yes How Accessible: Accessible via walker Home Equipment: Hand held shower head          Prior Functioning/Environment Level of Independence: Independent        Comments: working as a Clinical cytogeneticist, driving, sings and plays guitar        OT Problem List: Decreased strength;Decreased range of motion;Decreased activity tolerance;Impaired balance (sitting and/or standing);Decreased knowledge of use of DME or AE;Pain   OT Treatment/Interventions: Self-care/ADL training;DME and/or AE instruction;Therapeutic activities;Patient/family education;Balance training    OT Goals(Current goals can be found in the care plan section) Acute Rehab OT Goals Patient Stated Goal: To go home OT Goal Formulation: With patient Time For Goal Achievement: 06/09/16 Potential to Achieve Goals: Good ADL Goals Pt Will Perform Grooming: with modified independence;standing Pt Will Perform Lower Body Dressing: with min guard assist;with caregiver independent in assisting;with adaptive equipment;sit to/from stand Pt Will Transfer to Toilet: with modified independence;ambulating (3 in 1 over toilet) Pt Will Perform Toileting - Clothing Manipulation and hygiene: with modified independence;sit to/from stand  OT Frequency: Min 2X/week   Barriers to D/C:    none       Co-evaluation              End of Session Equipment Utilized During Treatment: Other (comment) (educated on grabber/reacher and long handle sponge) CPM Right Knee CPM Right Knee: On Right Knee Flexion (Degrees):  60 Right Knee Extension (Degrees): 0 Additional Comments: trapeze bar patient helper Nurse Communication: Other (comment) (in room administering pain medicine)  Activity Tolerance: Patient limited by pain Patient left: in CPM;in bed;with call bell/phone within reach;with family/visitor present   Time: [id number] OT Time Calculation (min): 18 min Charges:  OT General Charges $OT Visit: 1 Procedure OT Evaluation $OT Eval Moderate Complexity: 1 Procedure G-Codes:    Merri Ray Tarl Cephas 24-Jun-2016, 4:26 PM  Hulda Humphrey OTR/L [phone]

## 2016-06-03 LAB — BASIC METABOLIC PANEL
ANION GAP: 8 (ref 5–15)
BUN: 17 mg/dL (ref 6–20)
CO2: 27 mmol/L (ref 22–32)
Calcium: 8.5 mg/dL — ABNORMAL LOW (ref 8.9–10.3)
Chloride: 100 mmol/L — ABNORMAL LOW (ref 101–111)
Creatinine, Ser: 1.18 mg/dL (ref 0.61–1.24)
GFR calc Af Amer: >60 mL/min (ref >60)
GLUCOSE: 133 mg/dL — AB (ref 65–99)
POTASSIUM: 3.7 mmol/L (ref 3.5–5.1)
Sodium: 135 mmol/L (ref 135–145)

## 2016-06-03 LAB — CBC
HEMATOCRIT: 34 % — AB (ref 39.0–52.0)
HEMOGLOBIN: 11.8 g/dL — AB (ref 13.0–17.0)
MCH: 32.2 pg (ref 26.0–34.0)
MCHC: 34.7 g/dL (ref 30.0–36.0)
MCV: 92.6 fL (ref 78.0–100.0)
PLATELETS: 152 10*3/uL (ref 150–400)
RBC: 3.67 MIL/uL — AB (ref 4.22–5.81)
RDW: 12.4 % (ref 11.5–15.5)
WBC: 8.9 10*3/uL (ref 4.0–10.5)

## 2016-06-03 MED ORDER — OXYCODONE HCL 5 MG PO TABS: 5.0000 mg | ORAL_TABLET | ORAL | 0 refills | Status: DC | PRN

## 2016-06-03 MED ORDER — RIVAROXABAN 10 MG PO TABS: 10.0000 mg | ORAL_TABLET | Freq: Every day | ORAL | 0 refills | Status: DC

## 2016-06-03 MED ORDER — ACETAMINOPHEN 500 MG PO TABS: 1000.0000 mg | ORAL_TABLET | Freq: Four times a day (QID) | ORAL | 0 refills | Status: DC

## 2016-06-03 MED ORDER — METHOCARBAMOL 500 MG PO TABS: 500.0000 mg | ORAL_TABLET | Freq: Four times a day (QID) | ORAL | 0 refills | Status: DC | PRN

## 2016-06-03 MED FILL — XARELTO 10 MG TABLET: 10 | 12 days supply | Qty: 12 | Fill #0

## 2016-06-03 MED FILL — oxyCODONE HCL 5 MG TABS: 5 | 7 days supply | Qty: 85 | Fill #0

## 2016-06-03 MED FILL — METHOCARBAMOL 500 MG TABLET: 500 | 8 days supply | Qty: 30 | Fill #0

## 2016-06-03 NOTE — Discharge Summary (Signed)
James Fears, MD   Biagio Borg, PA-C [address] [place], [place]  [address]                             [phone]  PATIENT ID: James Gallegos        MRN:  [id number]          DOB/AGE: Aug 30, 1950 / 65 y.o.    DISCHARGE SUMMARY  ADMISSION DATE:    06/01/2016 DISCHARGE DATE:   06/03/2016   ADMISSION DIAGNOSIS: RIGHT KNEE OSTEOARTHRITIS    DISCHARGE DIAGNOSIS:  RIGHT KNEE OSTEOARTHRITIS    ADDITIONAL DIAGNOSIS: Principal Problem:   Unilateral primary osteoarthritis, right knee Active Problems:   S/P total knee replacement using cement, right  Past Medical History:  Diagnosis Date  . Arthritis   . Deep vein thrombosis ([place])   . Hypertension   . Murmur, cardiac   . Sleep apnea    CPAP    PROCEDURE: Procedure(s): TOTAL KNEE ARTHROPLASTY Right on 06/01/2016  CONSULTS: none    HISTORY:Patient is a 65 y.o.malepresented with a history of pain in the rightknee for 2 years. Onset of symptoms was gradual starting 1yearagowith gradually worseningcourse since that time. Prior procedures on the knee are arthroscopy. Patient has been treated conservatively with over-the-counter NSAIDs and activity modification. Patient currently rates pain in the knee at 7out of 10 with activity. There is pain at night.present.  They have been previously treated with: NSAIDS: Diclofenac, NSAID with mild improvement Knee injection with corticosteroid wasperformed Knee injection with visco supplementation wasperformed Medications: Diclofenac, NSAID, Steriods, with mild improvement   HOSPITAL COURSE:  James Gallegos is a 65 y.o. admitted on 06/01/2016 and found to have a diagnosis of [name].  After appropriate laboratory studies were obtained  they were taken to the operating room on 06/01/2016 and underwent  Procedure(s): TOTAL KNEE ARTHROPLASTY  Right.   They were given perioperative antibiotics:  Anti-infectives    Start     Dose/Rate Route  Frequency Ordered Stop   06/01/16 1430  ceFAZolin (ANCEF) IVPB 2g/100 mL premix     2 g 200 mL/hr over 30 Minutes Intravenous Every 6 hours 06/01/16 1336 06/01/16 2115   06/01/16 0545  ceFAZolin (ANCEF) IVPB 2g/100 mL premix     2 g 200 mL/hr over 30 Minutes Intravenous On call to O.R. 06/01/16 0545 06/01/16 0730    .  Tolerated the procedure well. Toradol was given post op.  POD #1, allowed out of bed to a chair.  PT for ambulation and exercise program.  Hemovac pulled.  IV saline locked.  O2 discontionued.  POD #2, continued PT and ambulation.    The remainder of the hospital course was dedicated to ambulation and strengthening.   The patient was discharged on 2 Days Post-Op in  Stable condition.  Blood products given:none  DIAGNOSTIC STUDIES: Recent vital signs: Patient Vitals for the past 24 hrs:  BP Temp Temp src Pulse Resp SpO2  06/03/16 0500 130/67 99.5 F (37.5 C) Oral 72 16 98 %  06/03/16 0347 - 99.7 F (37.6 C) Oral - - -  06/02/16 2049 - 99 F (37.2 C) Oral - - -  06/02/16 1933 - (!) 100.6 F (38.1 C) Oral - - -  06/02/16 1808 - (!) 101.1 F (38.4 C) Oral - - -  06/02/16 1255 (!) 128/54 98.8 F (37.1 C) Oral 61 16 98 %  Recent laboratory studies:  Recent Labs  06/02/16 0715 06/03/16 0530  WBC 8.1 8.9  HGB 12.3* 11.8*  HCT 35.1* 34.0*  PLT 143* 152    Recent Labs  06/02/16 0715 06/03/16 0530  NA 135 135  K 3.8 3.7  CL 104 100*  CO2 23 27  BUN 25* 17  CREATININE 1.12 1.18  GLUCOSE 138* 133*  CALCIUM 8.5* 8.5*   Lab Results  Component Value Date   INR 0.94 05/21/2016     Recent Radiographic Studies :  Dg Chest 2 View  Result Date: 05/21/2016 CLINICAL DATA:  Total knee replacement. EXAM: CHEST  2 VIEW COMPARISON:  No recent prior. FINDINGS: Cardiomegaly is noted. Mild pulmonary vascular prominence. Low lung volumes. No pleural effusion or pneumothorax. No acute bony abnormality. Degenerative changes thoracic spine . IMPRESSION: 1.  Cardiomegaly.  Mild pulmonary vascular prominence. 2. Low lung volumes. Electronically Signed   By: Marcello Moores  Register   On: 05/21/2016 09:35    DISCHARGE INSTRUCTIONS: Discharge Instructions    CPM    Complete by:  As directed    Continuous passive motion machine (CPM):      Use the CPM from 0 to 60 for starts then You may increase by 5-10 degrees per day for 6-8 hours per day.      You may increase by 5-10 degrees per day.  You may break it up into 2 or 3 sessions per day.      Use CPM for 3-4  weeks or until you are told to stop.   Call MD / Call 911    Complete by:  As directed    If you experience chest pain or shortness of breath, CALL 911 and be transported to the hospital emergency room.  If you develope a fever above 101 F, pus (white drainage) or increased drainage or redness at the wound, or calf pain, call your surgeon's office.   Change dressing    Complete by:  As directed    DO NOT CHANGE YOUR DRESSING   Constipation Prevention    Complete by:  As directed    Drink plenty of fluids.  Prune juice may be helpful.  You may use a stool softener, such as Colace (over the counter) 100 mg twice a day.  Use MiraLax (over the counter) for constipation as needed.   Diet general    Complete by:  As directed    Discharge instructions    Complete by:  As directed    [place] items at home which could result in a fall. This includes throw rugs or furniture in walking pathways ICE to the affected joint every three hours while awake for 30 minutes at a time, for at least the first 3-5 days, and then as needed for pain and swelling.  Continue to use ice for pain and swelling. You may notice swelling that will progress down to the foot and ankle.  This is normal after surgery.  Elevate your leg when you are not up walking on it.   Continue to use the breathing machine you got in the hospital (incentive spirometer) which will help keep your temperature down.   It is common for your temperature to cycle up and down following surgery, especially at night when you are not up moving around and exerting yourself.  The breathing machine keeps your lungs expanded and your temperature down.   DIET:  As you were doing prior to hospitalization, we  recommend a well-balanced diet.  DRESSING / WOUND CARE / SHOWERING  Keep the surgical dressing until follow up.  The dressing is water proof, so you can shower without any extra covering.  IF THE DRESSING FALLS OFF or the wound gets wet inside, change the dressing with sterile gauze.  Please use good hand washing techniques before changing the dressing.  Do not use any lotions or creams on the incision until instructed by your surgeon.    ACTIVITY  Increase activity slowly as tolerated, but follow the weight bearing instructions below.   No driving for 6 weeks or until further direction given by your physician.  You cannot drive while taking narcotics.  No lifting or carrying greater than 10 lbs. until further directed by your surgeon. Avoid periods of inactivity such as sitting longer than an hour when not asleep. This helps prevent blood clots.  You may return to work once you are authorized by your doctor.     WEIGHT BEARING   Partial weight bearing with assist device as directed.  50% AS TAUGHT   EXERCISES  Results after joint replacement surgery are often greatly improved when you follow the exercise, range of motion and muscle strengthening exercises prescribed by your doctor. Safety measures are also important to protect the joint from further injury. Any time any of these exercises cause you to have increased pain or swelling, decrease what you are doing until you are comfortable again and then slowly increase them. If you have problems or questions, call your caregiver or physical therapist for advice.   Rehabilitation is important following a joint replacement. After just a few days of  immobilization, the muscles of the leg can become weakened and shrink (atrophy).  These exercises are designed to build up the tone and strength of the thigh and leg muscles and to improve motion. Often times heat used for twenty to thirty minutes before working out will loosen up your tissues and help with improving the range of motion but do not use heat for the first two weeks following surgery (sometimes heat can increase post-operative swelling).   These exercises can be done on a training (exercise) mat, on the floor, on a table or on a bed. Use whatever works the best and is most comfortable for you.    Use music or television while you are exercising so that the exercises are a pleasant break in your day. This will make your life better with the exercises acting as a break in your routine that you can look forward to.   Perform all exercises about fifteen times, three times per day or as directed.  You should exercise both the operative leg and the other leg as well.   Exercises include:   Quad Sets - Tighten up the muscle on the front of the thigh (Quad) and hold for 5-10 seconds.   Straight Leg Raises - With your knee straight (if you were given a brace, keep it on), lift the leg to 60 degrees, hold for 3 seconds, and slowly lower the leg.  Perform this exercise against resistance later as your leg gets stronger.  Leg Slides: Lying on your back, slowly slide your foot toward your buttocks, bending your knee up off the floor (only go as far as is comfortable). Then slowly slide your foot back down until your leg is flat on the floor again.  Angel Wings: Lying on your back spread your legs to the side as far apart as you  can without causing discomfort.  Hamstring Strength:  Lying on your back, push your heel against the floor with your leg straight by tightening up the muscles of your buttocks.  Repeat, but this time bend your knee to a comfortable angle, and push your heel against the floor.  You  may put a pillow under the heel to make it more comfortable if necessary.   A rehabilitation program following joint replacement surgery can speed recovery and prevent re-injury in the future due to weakened muscles. Contact your doctor or a physical therapist for more information on knee rehabilitation.    CONSTIPATION  Constipation is defined medically as fewer than three stools per week and severe constipation as less than one stool per week.  Even if you have a regular bowel pattern at home, your normal regimen is likely to be disrupted due to multiple reasons following surgery.  Combination of anesthesia, postoperative narcotics, change in appetite and fluid intake all can affect your bowels.   YOU MUST use at least one of the following options; they are listed in order of increasing strength to get the job done.  They are all available over the counter, and you may need to use some, POSSIBLY even all of these options:    Drink plenty of fluids (prune juice may be helpful) and high fiber foods Colace 100 mg by mouth twice a day  Senokot for constipation as directed and as needed Dulcolax (bisacodyl), take with full glass of water  Miralax (polyethylene glycol) once or twice a day as needed.  If you have tried all these things and are unable to have a bowel movement in the first 3-4 days after surgery call either your surgeon or your primary doctor.    If you experience loose stools or diarrhea, hold the medications until you stool forms back up.  If your symptoms do not get better within 1 week or if they get worse, check with your doctor.  If you experience "the worst abdominal pain ever" or develop nausea or vomiting, please contact the office immediately for further recommendations for treatment.   ITCHING:  If you experience itching with your medications, try taking only a single pain pill, or even half a pain pill at a time.  You can also use Benadryl over the counter for itching or  also to help with sleep.   TED HOSE STOCKINGS:  Use stockings on both legs until for at least 2 weeks or as directed by physician office. They may be removed at night for sleeping.  MEDICATIONS:  See your medication summary on the "After Visit Summary" that nursing will review with you.  You may have some home medications which will be placed on hold until you complete the course of blood thinner medication.  It is important for you to complete the blood thinner medication as prescribed.  PRECAUTIONS:  If you experience chest pain or shortness of breath - call 911 immediately for transfer to the hospital emergency department.   If you develop a fever greater that 101 F, purulent drainage from wound, increased redness or drainage from wound, foul odor from the wound/dressing, or calf pain - CONTACT YOUR SURGEON.                                                   FOLLOW-UP APPOINTMENTS:  If you do not already have a post-op appointment, please call the office for an appointment to be seen by your surgeon.  Guidelines for how soon to be seen are listed in your "After Visit Summary", but are typically between 1-4 weeks after surgery.  OTHER INSTRUCTIONS:   Knee Replacement:  Do not place pillow under knee, focus on keeping the knee straight while resting. CPM instructions: 0-90 degrees, 2 hours in the morning, 2 hours in the afternoon, and 2 hours in the evening. Place foam block, curve side up under heel at all times except when in CPM or when walking.  DO NOT modify, tear, cut, or change the foam block in any way.  MAKE SURE YOU:  Understand these instructions.  Get help right away if you are not doing well or get worse.    Thank you for letting us be a part of your medical care team.  It is a privilege we respect greatly.  We hope these instructions will help you stay on track for a fast and full recovery!   Do not put a pillow under the knee. Place it under the heel.    Complete by:  As  directed    Driving restrictions    Complete by:  As directed    No driving for 6 weeks   Increase activity slowly as tolerated    Complete by:  As directed    Lifting restrictions    Complete by:  As directed    No lifting for 6 weeks   Partial weight bearing    Complete by:  As directed    % Body Weight:  50%   Laterality:  right   Extremity:  Lower   Patient may shower    Complete by:  As directed    You may shower over the brown dressing   TED hose    Complete by:  As directed    Use stockings (TED hose) for 2-3 weeks on right leg.  You may remove them at night for sleeping.      DISCHARGE MEDICATIONS:   Allergies as of 06/03/2016   No Known Allergies     Medication List    STOP taking these medications   diclofenac 75 MG EC tablet Commonly known as:  VOLTAREN     TAKE these medications   acetaminophen 500 MG tablet Commonly known as:  TYLENOL Take 2 tablets (1,000 mg total) by mouth every 6 (six) hours.   B-complex with vitamin C tablet Take 1 tablet by mouth daily.   DHEA 50 MG Tabs Take 50 mg by mouth daily.   eplerenone 25 MG tablet Commonly known as:  INSPRA Take 25 mg by mouth daily.   fluticasone 50 MCG/ACT nasal spray Commonly known as:  FLONASE Place 2 sprays into the nose daily. What changed:  how much to take  when to take this  reasons to take this   Lysine 1000 MG Tabs Take 1,000 mg by mouth 2 (two) times daily.   MAGNESIUM GLYCINATE PLUS PO Take 400 mg by mouth at bedtime.   methocarbamol 500 MG tablet Commonly known as:  ROBAXIN Take 1 tablet (500 mg total) by mouth every 6 (six) hours as needed for muscle spasms.   oxyCODONE 5 MG immediate release tablet Commonly known as:  Oxy IR/ROXICODONE Take 1-2 tablets (5-10 mg total) by mouth every 4 (four) hours as needed for moderate pain or severe pain.   rivaroxaban 10 MG Tabs tablet Commonly  known as:  XARELTO Take 1 tablet (10 mg total) by mouth daily with breakfast. Start  taking on:  06/04/2016   saw palmetto 500 MG capsule Take 500 mg by mouth daily.   telmisartan-hydrochlorothiazide 80-12.5 MG tablet Commonly known as:  MICARDIS HCT Take 1 tablet by mouth daily.   Testosterone 12.5 MG/ACT (1%) Gel Apply 1 mL topically at bedtime. Apply to shoulders at bedtime.   Vitamin B-12 5000 MCG Tbdp Take 5,000 mcg by mouth daily.   vitamin C 1000 MG tablet Take 5,000 mg by mouth daily.   Vitamin D-3 5000 units Tabs Take 5,000 Units by mouth daily.   Vitamin K2 100 MCG Caps Take 100 mcg by mouth 2 (two) times daily.            Durable Medical Equipment        Start     Ordered   06/01/16 1337  DME Walker rolling  Once    Question:  Patient needs a walker to treat with the following condition  Answer:  Total knee replacement status, right   06/01/16 1337   06/01/16 1337  DME 3 n 1  Once     06/01/16 1337   06/01/16 1337  DME Bedside commode  Once    Question:  Patient needs a bedside commode to treat with the following condition  Answer:  S/P TKR (total knee replacement) using cement, right   06/01/16 1337      FOLLOW UP VISIT:   Follow-up Information    Biagio Borg, PA-C Follow up on 06/16/2016.   Specialty:  Orthopedic Surgery Contact information: [place] [address] [phone]           DISPOSITION:   Home  CONDITION:  Stable   Mike Craze. [place], [place] [phone]  06/03/2016 11:35 AM

## 2016-06-03 NOTE — Progress Notes (Signed)
Discharge instructions and prescriptions reviewed with patient. Patient stated understanding. IV removed. Patient family at bedside for transportation

## 2016-06-03 NOTE — Progress Notes (Signed)
Occupational Therapy Treatment and Discharge Patient Details Name: James Gallegos MRN: [id number] DOB: 07-03-1950 Today's Date: 06/03/2016    History of present illness Patient is a 65 y/o male s/p R TKA.  PMH sleep apnea, HTN, DVT, abdominal hernia.    OT comments  Pt met all OT goals. Pt fully assessed and received all education. Going home with very supportive family and good attitude and motivate to maximize independence. This session focused on LB dressing and family education. Neither Pt or family had any further questions or concerns. OT to sign off at this time.   Follow Up Recommendations  No OT follow up;Supervision - Intermittent    Equipment Recommendations  3 in 1 bedside commode    Recommendations for Other Services      Precautions / Restrictions Precautions Precautions: Knee Precaution Comments: reviewed precautions Restrictions Weight Bearing Restrictions: Yes RLE Weight Bearing: Partial weight bearing RLE Partial Weight Bearing Percentage or Pounds: 50%       Mobility Bed Mobility Overal bed mobility: Modified Independent Bed Mobility: Supine to Sit           General bed mobility comments: Pt OOB in recliner when OT entered the room  Transfers Overall transfer level: Needs assistance Equipment used: Rolling walker (2 wheeled) Transfers: Sit to/from Stand Sit to Stand: Supervision         General transfer comment: good hand placement and technique    Balance Overall balance assessment: Needs assistance Sitting-balance support: No upper extremity supported;Feet supported Sitting balance-Leahy Scale: Good     Standing balance support: No upper extremity supported;During functional activity Standing balance-Leahy Scale: Fair Standing balance comment: Pt able to balance and pull up pants with no LOB, and maitain PWB                   ADL Overall ADL's : Needs assistance/impaired                 Upper Body Dressing :  Modified independent;Sitting   Lower Body Dressing: Moderate assistance;With caregiver independent assisting;Sit to/from stand Lower Body Dressing Details (indicate cue type and reason): Pt able to don underwear, pants, socks, and shoes, OT assisted, and also grandson assisted. Pt only required assist for RLE Toilet Transfer: Min guard;Comfort height toilet;RW Armed forces technical officer Details (indicate cue type and reason): simulated with recliner, Pt declined at this time         Functional mobility during ADLs: Min guard;Rolling walker General ADL Comments: Pt in much less pain today, and happy to demonstrate safe transfers and mobility with ADL. Pt very motivated to be independent and family very proactive in assistance      Vision                     Perception     Praxis      Cognition   Behavior During Therapy: WFL for tasks assessed/performed Overall Cognitive Status: Within Functional Limits for tasks assessed                       Extremity/Trunk Assessment               Exercises    Shoulder Instructions       General Comments      Pertinent Vitals/ Pain       Pain Assessment: 0-10 Pain Score: 5  Pain Location: R knee Pain Descriptors / Indicators: Aching;Sore Pain Intervention(s): Limited activity within patient's tolerance;Monitored during  session  Home Living                                          Prior Functioning/Environment              Frequency  Min 2X/week        Progress Toward Goals  OT Goals(current goals can now be found in the care plan section)  Progress towards OT goals: Goals met/education completed, patient discharged from OT  Acute Rehab OT Goals Patient Stated Goal: To go home OT Goal Formulation: With patient Time For Goal Achievement: 06/09/16 Potential to Achieve Goals: Good  Plan Discharge plan remains appropriate;All goals met and education completed, patient discharged from OT  services    Co-evaluation                 End of Session Equipment Utilized During Treatment: Rolling walker   Activity Tolerance Patient tolerated treatment well   Patient Left in chair;with call bell/phone within reach;with family/visitor present   Nurse Communication Mobility status        Time: 8177-1165 OT Time Calculation (min): 16 min  Charges: OT General Charges $OT Visit: 1 Procedure OT Treatments $Self Care/Home Management : 8-22 mins  Merri Ray Tyashia Morrisette 06/03/2016, 1:09 PM Hulda Humphrey OTR/L [phone]

## 2016-06-03 NOTE — Progress Notes (Signed)
PATIENT ID: James Gallegos        MRN:  [id number]          DOB/AGE: 67-27-1952 / 67 y.o.    [name], MD   Biagio Borg, PA-C [address] [place], [place]  [address]                             [phone]   PROGRESS NOTE  Subjective:  negative for Chest Pain  negative for Shortness of Breath  negative for Nausea/Vomiting   negative for Calf Pain    Tolerating Diet: yes         Patient reports pain as mild and moderate.     Desires to go home today  Objective: Vital signs in last 24 hours:   Patient Vitals for the past 24 hrs:  BP Temp Temp src Pulse Resp SpO2  06/03/16 0500 130/67 99.5 F (37.5 C) Oral 72 16 98 %  06/03/16 0347 - 99.7 F (37.6 C) Oral - - -  06/02/16 2049 - 99 F (37.2 C) Oral - - -  06/02/16 1933 - (!) 100.6 F (38.1 C) Oral - - -  06/02/16 1808 - (!) 101.1 F (38.4 C) Oral - - -  06/02/16 1255 (!) 128/54 98.8 F (37.1 C) Oral 61 16 98 %      Intake/Output from previous day:   12/20 0701 - 12/21 0700 In: 1440 [P.O.:1440] Out: 900 [Urine:900]   Intake/Output this shift:   No intake/output data recorded.   Intake/Output      12/20 0701 - 12/21 0700 12/21 0701 - 12/22 0700   P.O. 1440    I.V.     IV Piggyback     Total Intake 1440     Urine 900    Drains     Blood     Total Output 900     Net +540             LABORATORY DATA:  Recent Labs  06/02/16 0715 06/03/16 0530  WBC 8.1 8.9  HGB 12.3* 11.8*  HCT 35.1* 34.0*  PLT 143* 152    Recent Labs  06/02/16 0715 06/03/16 0530  NA 135 135  K 3.8 3.7  CL 104 100*  CO2 23 27  BUN 25* 17  CREATININE 1.12 1.18  GLUCOSE 138* 133*  CALCIUM 8.5* 8.5*   Lab Results  Component Value Date   INR 0.94 05/21/2016    Recent Radiographic Studies :  Dg Chest 2 View  Result Date: 05/21/2016 CLINICAL DATA:  Total knee replacement. EXAM: CHEST  2 VIEW COMPARISON:  No recent prior. FINDINGS: Cardiomegaly is noted. Mild pulmonary vascular prominence. Low lung volumes.  No pleural effusion or pneumothorax. No acute bony abnormality. Degenerative changes thoracic spine . IMPRESSION: 1. Cardiomegaly.  Mild pulmonary vascular prominence. 2. Low lung volumes. Electronically Signed   By: Marcello Moores  Register   On: 05/21/2016 09:35     Examination:  General appearance: alert, cooperative, mild distress and moderate distress Extremities: extremities normal, atraumatic, no cyanosis or edema  Wound Exam: clean, dry, intact   Drainage:  None: wound tissue dry  Motor Exam: EHL, FHL, Anterior Tibial and Posterior Tibial Intact  Sensory Exam: Superficial Peroneal, Deep Peroneal and Tibial normal  Vascular Exam: Right posterior tibial artery has 1+ (weak) pulse  Assessment:    2 Days Post-Op  Procedure(s) (LRB): TOTAL KNEE ARTHROPLASTY (Right)  ADDITIONAL DIAGNOSIS:  Principal Problem:   Unilateral primary osteoarthritis, right knee Active Problems:   S/P total knee replacement using cement, right     Plan: Physical Therapy as ordered Partial Weight Bearing @ 50% (PWB)  DVT Prophylaxis:  Partial Weight Bearing @ 50% (PWB)  DISCHARGE PLAN: Home  DISCHARGE NEEDS: HHPT, CPM, Walker and 3-in-1 comode seat        Biagio Borg  06/03/2016 11:21 AM  Patient ID: James Gallegos, male   DOB: 12/02/1950, 65 y.o.   MRN: [id number]

## 2016-06-03 NOTE — Consult Note (Signed)
   [facility] CM Inpatient Consult   06/03/2016  JAYMIER EDMUNDSON 07-08-1950 [id number]    Came to bedside to speak with Mr.Pickney prior to discharge on behalf of [facility] program for [facility] employees/dependents with [facility] insurance. Mr. Blamer denied any Link to Wellness needs at this time. Confirmed best contact number as [phone] for post discharge call. Contact information provided. Appreciative of visit.    Marthenia Rolling, MSN-Ed, RN,BSN [facility] Liaison [phone]

## 2016-06-03 NOTE — Progress Notes (Signed)
Physical Therapy Treatment Patient Details Name: James Gallegos MRN: [id number] DOB: 1950-11-03 Today's Date: 06/03/2016    History of Present Illness Patient is a 65 y/o male s/p R TKA.  PMH sleep apnea, HTN, DVT, abdominal hernia.     PT Comments    Patient reported decreased pain from yesterday and continues to progress with mobility. Practiced stairs second time this session and wife assisted. Reviewed precautions and HEP with pt and wife. Current plan remains appropriate.   Follow Up Recommendations  Home health PT     Equipment Recommendations  Rolling walker with 5" wheels    Recommendations for Other Services       Precautions / Restrictions Precautions Precautions: Knee Precaution Comments: reviewed precautions Restrictions Weight Bearing Restrictions: Yes RLE Weight Bearing: Partial weight bearing RLE Partial Weight Bearing Percentage or Pounds: 50%    Mobility  Bed Mobility Overal bed mobility: Modified Independent Bed Mobility: Supine to Sit           General bed mobility comments: increased time/effort  Transfers Overall transfer level: Needs assistance Equipment used: Rolling walker (2 wheeled) Transfers: Sit to/from Stand Sit to Stand: Supervision         General transfer comment: good hand placement and technique  Ambulation/Gait Ambulation/Gait assistance: Supervision Ambulation Distance (Feet): 180 Feet Assistive device: Rolling walker (2 wheeled) Gait Pattern/deviations: Step-to pattern;Decreased stride length Gait velocity: decreased   General Gait Details: pt able to maintain PWB status; cues for cadence and posture; rest breaks periodically due to UE fatigue   Stairs Stairs: Yes   Stair Management: No rails;Step to pattern;Backwards;With walker Number of Stairs: 2 General stair comments: cues for sequencing; wife stabilized RW  Wheelchair Mobility    Modified Rankin (Stroke Patients Only)       Balance      Sitting balance-Leahy Scale: Good       Standing balance-Leahy Scale: Poor Standing balance comment: UE support needed due to [facility]                    Cognition Arousal/Alertness: Awake/alert Behavior During Therapy: WFL for tasks assessed/performed Overall Cognitive Status: Within Functional Limits for tasks assessed                      Exercises Total Joint Exercises Quad Sets: AROM;Right;15 reps Heel Slides: AROM;Right;15 reps Straight Leg Raises: Right;10 reps;AAROM Long Arc Quad: AROM;Right;Seated;15 reps Knee Flexion: AROM;Right;5 reps;Seated;Other (comment) (10 sec holds) Goniometric ROM: 80 degrees flexion    General Comments General comments (skin integrity, edema, etc.): wife and grandson present for session      Pertinent Vitals/Pain Pain Assessment: 0-10 Pain Score: 5  Pain Location: R knee Pain Descriptors / Indicators: Aching;Sore Pain Intervention(s): Limited activity within patient's tolerance;Monitored during session;Premedicated before session;Repositioned;Ice applied    Home Living                      Prior Function            PT Goals (current goals can now be found in the care plan section) Acute Rehab PT Goals Patient Stated Goal: To go home Progress towards PT goals: Progressing toward goals    Frequency    7X/week      PT Plan Current plan remains appropriate    Co-evaluation             End of Session Equipment Utilized During Treatment: Gait belt Activity Tolerance: Patient tolerated treatment  well Patient left: in chair;with call bell/phone within reach;with family/visitor present     Time: [id number] PT Time Calculation (min) (ACUTE ONLY): 44 min  Charges:  $Gait Training: 8-22 mins $Therapeutic Exercise: 8-22 mins $Therapeutic Activity: 8-22 mins                    G Codes:      [place]ina April, PTA Pager: [phone]   06/03/2016, 11:05 AM

## 2016-06-04 ENCOUNTER — Telehealth (INDEPENDENT_AMBULATORY_CARE_PROVIDER_SITE_OTHER): Payer: Self-pay | Admitting: Orthopaedic Surgery

## 2016-06-04 DIAGNOSIS — Z86718 Personal history of other venous thrombosis and embolism: Secondary | ICD-10-CM | POA: Diagnosis not present

## 2016-06-04 DIAGNOSIS — Z96651 Presence of right artificial knee joint: Secondary | ICD-10-CM | POA: Diagnosis not present

## 2016-06-04 DIAGNOSIS — G4730 Sleep apnea, unspecified: Secondary | ICD-10-CM | POA: Diagnosis not present

## 2016-06-04 DIAGNOSIS — I10 Essential (primary) hypertension: Secondary | ICD-10-CM | POA: Diagnosis not present

## 2016-06-04 DIAGNOSIS — Z79891 Long term (current) use of opiate analgesic: Secondary | ICD-10-CM | POA: Diagnosis not present

## 2016-06-04 DIAGNOSIS — K469 Unspecified abdominal hernia without obstruction or gangrene: Secondary | ICD-10-CM | POA: Diagnosis not present

## 2016-06-04 DIAGNOSIS — Z471 Aftercare following joint replacement surgery: Secondary | ICD-10-CM | POA: Diagnosis not present

## 2016-06-04 DIAGNOSIS — Z7901 Long term (current) use of anticoagulants: Secondary | ICD-10-CM | POA: Diagnosis not present

## 2016-06-04 NOTE — Telephone Encounter (Signed)
Jackelyn Poling w/[facility] [phone] saw patient today for an evaluation and will be seeing him tomorrow and would like 3 visits next week.

## 2016-06-05 DIAGNOSIS — G4730 Sleep apnea, unspecified: Secondary | ICD-10-CM | POA: Diagnosis not present

## 2016-06-05 DIAGNOSIS — K469 Unspecified abdominal hernia without obstruction or gangrene: Secondary | ICD-10-CM | POA: Diagnosis not present

## 2016-06-05 DIAGNOSIS — Z79891 Long term (current) use of opiate analgesic: Secondary | ICD-10-CM | POA: Diagnosis not present

## 2016-06-05 DIAGNOSIS — Z7901 Long term (current) use of anticoagulants: Secondary | ICD-10-CM | POA: Diagnosis not present

## 2016-06-05 DIAGNOSIS — Z471 Aftercare following joint replacement surgery: Secondary | ICD-10-CM | POA: Diagnosis not present

## 2016-06-05 DIAGNOSIS — I10 Essential (primary) hypertension: Secondary | ICD-10-CM | POA: Diagnosis not present

## 2016-06-05 DIAGNOSIS — Z86718 Personal history of other venous thrombosis and embolism: Secondary | ICD-10-CM | POA: Diagnosis not present

## 2016-06-05 DIAGNOSIS — Z96651 Presence of right artificial knee joint: Secondary | ICD-10-CM | POA: Diagnosis not present

## 2016-06-09 DIAGNOSIS — Z471 Aftercare following joint replacement surgery: Secondary | ICD-10-CM | POA: Diagnosis not present

## 2016-06-09 DIAGNOSIS — Z96651 Presence of right artificial knee joint: Secondary | ICD-10-CM | POA: Diagnosis not present

## 2016-06-09 DIAGNOSIS — K469 Unspecified abdominal hernia without obstruction or gangrene: Secondary | ICD-10-CM | POA: Diagnosis not present

## 2016-06-09 DIAGNOSIS — G4730 Sleep apnea, unspecified: Secondary | ICD-10-CM | POA: Diagnosis not present

## 2016-06-09 DIAGNOSIS — I10 Essential (primary) hypertension: Secondary | ICD-10-CM | POA: Diagnosis not present

## 2016-06-09 DIAGNOSIS — Z79891 Long term (current) use of opiate analgesic: Secondary | ICD-10-CM | POA: Diagnosis not present

## 2016-06-09 DIAGNOSIS — Z86718 Personal history of other venous thrombosis and embolism: Secondary | ICD-10-CM | POA: Diagnosis not present

## 2016-06-09 DIAGNOSIS — Z7901 Long term (current) use of anticoagulants: Secondary | ICD-10-CM | POA: Diagnosis not present

## 2016-06-10 ENCOUNTER — Telehealth (INDEPENDENT_AMBULATORY_CARE_PROVIDER_SITE_OTHER): Payer: Self-pay | Admitting: Orthopaedic Surgery

## 2016-06-10 DIAGNOSIS — Z86718 Personal history of other venous thrombosis and embolism: Secondary | ICD-10-CM | POA: Diagnosis not present

## 2016-06-10 DIAGNOSIS — G4730 Sleep apnea, unspecified: Secondary | ICD-10-CM | POA: Diagnosis not present

## 2016-06-10 DIAGNOSIS — Z471 Aftercare following joint replacement surgery: Secondary | ICD-10-CM | POA: Diagnosis not present

## 2016-06-10 DIAGNOSIS — I10 Essential (primary) hypertension: Secondary | ICD-10-CM | POA: Diagnosis not present

## 2016-06-10 DIAGNOSIS — Z79891 Long term (current) use of opiate analgesic: Secondary | ICD-10-CM | POA: Diagnosis not present

## 2016-06-10 DIAGNOSIS — Z96651 Presence of right artificial knee joint: Secondary | ICD-10-CM | POA: Diagnosis not present

## 2016-06-10 DIAGNOSIS — Z7901 Long term (current) use of anticoagulants: Secondary | ICD-10-CM | POA: Diagnosis not present

## 2016-06-10 DIAGNOSIS — K469 Unspecified abdominal hernia without obstruction or gangrene: Secondary | ICD-10-CM | POA: Diagnosis not present

## 2016-06-10 NOTE — Telephone Encounter (Signed)
Patient is requesting refill of oxycodone.  He is also requesting a referral for physical therapy.

## 2016-06-10 NOTE — Telephone Encounter (Signed)
Please advise about meds and I can call about PT

## 2016-06-11 ENCOUNTER — Other Ambulatory Visit (INDEPENDENT_AMBULATORY_CARE_PROVIDER_SITE_OTHER): Payer: Self-pay | Admitting: *Deleted

## 2016-06-11 ENCOUNTER — Other Ambulatory Visit (INDEPENDENT_AMBULATORY_CARE_PROVIDER_SITE_OTHER): Payer: Self-pay

## 2016-06-11 DIAGNOSIS — M1711 Unilateral primary osteoarthritis, right knee: Secondary | ICD-10-CM

## 2016-06-11 MED ORDER — OXYCODONE HCL 5 MG PO TABS: 5.0000 mg | ORAL_TABLET | Freq: Four times a day (QID) | ORAL | 0 refills | Status: DC | PRN

## 2016-06-11 MED ORDER — OXYCODONE-ACETAMINOPHEN 5-325 MG PO TABS: 1.0000 | ORAL_TABLET | Freq: Four times a day (QID) | ORAL | 0 refills | Status: DC | PRN

## 2016-06-11 MED FILL — OXYCODONE W/APAP 5/325 TAB: 5-325 | 5 days supply | Qty: 40 | Fill #0

## 2016-06-11 NOTE — Telephone Encounter (Signed)
Called pt regarding PT and RX at front desk.

## 2016-06-11 NOTE — Telephone Encounter (Signed)
RX for oxycodone 5/325 #40 1-2 tabs po q6h prn-call pt re PT and what is most convenient

## 2016-06-12 DIAGNOSIS — G4730 Sleep apnea, unspecified: Secondary | ICD-10-CM | POA: Diagnosis not present

## 2016-06-12 DIAGNOSIS — Z96651 Presence of right artificial knee joint: Secondary | ICD-10-CM | POA: Diagnosis not present

## 2016-06-12 DIAGNOSIS — Z471 Aftercare following joint replacement surgery: Secondary | ICD-10-CM | POA: Diagnosis not present

## 2016-06-12 DIAGNOSIS — K469 Unspecified abdominal hernia without obstruction or gangrene: Secondary | ICD-10-CM | POA: Diagnosis not present

## 2016-06-12 DIAGNOSIS — Z86718 Personal history of other venous thrombosis and embolism: Secondary | ICD-10-CM | POA: Diagnosis not present

## 2016-06-12 DIAGNOSIS — I10 Essential (primary) hypertension: Secondary | ICD-10-CM | POA: Diagnosis not present

## 2016-06-12 DIAGNOSIS — Z79891 Long term (current) use of opiate analgesic: Secondary | ICD-10-CM | POA: Diagnosis not present

## 2016-06-12 DIAGNOSIS — Z7901 Long term (current) use of anticoagulants: Secondary | ICD-10-CM | POA: Diagnosis not present

## 2016-06-14 DIAGNOSIS — M1711 Unilateral primary osteoarthritis, right knee: Secondary | ICD-10-CM | POA: Diagnosis not present

## 2016-06-16 ENCOUNTER — Ambulatory Visit (INDEPENDENT_AMBULATORY_CARE_PROVIDER_SITE_OTHER): Payer: 59 | Admitting: Orthopedic Surgery

## 2016-06-16 ENCOUNTER — Ambulatory Visit (INDEPENDENT_AMBULATORY_CARE_PROVIDER_SITE_OTHER): Payer: Self-pay

## 2016-06-16 ENCOUNTER — Encounter (INDEPENDENT_AMBULATORY_CARE_PROVIDER_SITE_OTHER): Payer: Self-pay | Admitting: Orthopedic Surgery

## 2016-06-16 VITALS — BP 146/52 | HR 68 | Resp 14 | Ht 69.5 in | Wt 220.0 lb

## 2016-06-16 DIAGNOSIS — M25561 Pain in right knee: Secondary | ICD-10-CM

## 2016-06-16 DIAGNOSIS — G8929 Other chronic pain: Secondary | ICD-10-CM

## 2016-06-16 DIAGNOSIS — Z96652 Presence of left artificial knee joint: Secondary | ICD-10-CM

## 2016-06-16 NOTE — Progress Notes (Signed)
Office Visit Note   Patient: James Gallegos           Date of Birth: 05/15/1951           MRN: [id number] Visit Date: 06/16/2016              Requested by: No referring provider defined for this encounter. PCP: No PCP Per Patient   Assessment & Plan: Visit Diagnoses:  1. Total knee replacement status, left   2. Chronic pain of right knee     Plan:  #1: Staples removed and Steri-Strips are placed #2: His home physical therapy has discontinued him and therefore we are going to try to have him set up with physical therapy. Apparently having problems getting an appointment with them. #3: Follow back up with [place] in 1 week for recheck evaluation and to evaluate his range of motion. I did discuss with him that if he does not continue to improve that he may require manipulation. #4: He apparently has a broken tooth and I told him to see his his dentist ASAP.  Follow-Up Instructions: Return in about 1 week (around 06/23/2016).   Orders:  Orders Placed This Encounter  Procedures  . XR KNEE 3 VIEW RIGHT   No orders of the defined types were placed in this encounter.     Procedures: No procedures performed   Clinical Data: No additional findings.   Subjective: Chief Complaint  Patient presents with  . Right Knee - Follow-up    Total knee replacement 06/01/16, doing good he states. PT had discharged him from home PT and was to be set up with [facility] outpatient but has not been able to hear anything from [facility] - hasn't heard anything from [facility], Oxycodone - PRN - helps, sensitive, tenderness denies calf pain. Still using his CPM machine 6 hours a day. He is walking with a cane as per physical therapy.    Review of Systems  Constitutional: Negative.   HENT: Negative.   Eyes: Negative.   Respiratory: Negative.   Cardiovascular: Negative.   Gastrointestinal: Negative.   Endocrine: Negative.   Genitourinary: Negative.   Musculoskeletal: Negative.   Skin:  Negative.   Allergic/Immunologic: Negative.   Neurological: Negative.   Hematological: Negative.   Psychiatric/Behavioral: Negative.      Objective: Vital Signs: BP (!) 146/52 (BP Location: Left Arm, Patient Position: Sitting, Cuff Size: Normal)   Pulse 68   Resp 14   Ht 5' 9.5" (1.765 m)   Wt 220 lb (99.8 kg)   BMI 32.02 kg/m   Physical Exam  Constitutional: He is oriented to person, place, and time. He appears well-developed and well-nourished.  HENT:  Head: Normocephalic and atraumatic.  Eyes: EOM are normal. Pupils are equal, round, and reactive to light.  Neck:  No carotid bruits  Pulmonary/Chest: Effort normal.  Musculoskeletal:       Right knee: He exhibits effusion.  Neurological: He is alert and oriented to person, place, and time.  Skin: Skin is warm and dry.  Psychiatric: He has a normal mood and affect. His behavior is normal. Judgment and thought content normal.    Right Knee Exam   Range of Motion  Extension: 0  Flexion: 70   Other  Sensation: normal Pulse: present Swelling: mild Other tests: effusion present  Comments:  Ligamentous is stable.      Specialty Comments:  No specialty comments available.  Imaging: No results found.  Current Outpatient Prescriptions  Medication  Sig Dispense Refill  . Ascorbic Acid (VITAMIN C) 1000 MG tablet Take 5,000 mg by mouth daily.    . B Complex-C (B-COMPLEX WITH VITAMIN C) tablet Take 1 tablet by mouth daily.    . Cholecalciferol (VITAMIN D-3) 5000 units TABS Take 5,000 Units by mouth daily.    . Cyanocobalamin (VITAMIN B-12) 5000 MCG TBDP Take 5,000 mcg by mouth daily.    Marland Kitchen DHEA 50 MG TABS Take 50 mg by mouth daily.    Marland Kitchen eplerenone (INSPRA) 25 MG tablet Take 25 mg by mouth daily.    . fluticasone (FLONASE) 50 MCG/ACT nasal spray Place 2 sprays into the nose daily. (Patient taking differently: Place 1-2 sprays into the nose at bedtime as needed (for allergies/nasal symptoms). ) 16 g 2  . Lysine 1000 MG  TABS Take 1,000 mg by mouth 2 (two) times daily.    Marland Kitchen MAGNESIUM GLYCINATE PLUS PO Take 400 mg by mouth at bedtime.    . Menaquinone-7 (VITAMIN K2) 100 MCG CAPS Take 100 mcg by mouth 2 (two) times daily.    Marland Kitchen oxyCODONE-acetaminophen (PERCOCET/ROXICET) 5-325 MG tablet Take 1-2 tablets by mouth every 6 (six) hours as needed for severe pain. 40 tablet 0  . saw palmetto 500 MG capsule Take 500 mg by mouth daily.    Marland Kitchen telmisartan-hydrochlorothiazide (MICARDIS HCT) 80-12.5 MG tablet Take 1 tablet by mouth daily.    . Testosterone 12.5 MG/ACT (1%) GEL Apply 1 mL topically at bedtime. Apply to shoulders at bedtime.    Marland Kitchen acetaminophen (TYLENOL) 500 MG tablet Take 2 tablets (1,000 mg total) by mouth every 6 (six) hours. (Patient not taking: Reported on 06/16/2016) 30 tablet 0  . methocarbamol (ROBAXIN) 500 MG tablet Take 1 tablet (500 mg total) by mouth every 6 (six) hours as needed for muscle spasms. (Patient not taking: Reported on 06/16/2016) 30 tablet 0  . rivaroxaban (XARELTO) 10 MG TABS tablet Take 1 tablet (10 mg total) by mouth daily with breakfast. (Patient not taking: Reported on 06/16/2016) 12 tablet 0   No current facility-administered medications for this visit.       [name], MD   Biagio Borg, PA-C [address], [place], [place]  [address]                             [phone]

## 2016-06-17 MED FILL — TELMISARTAN-HCTZ 80-12.5 MG: 80-12.5 | 30 days supply | Qty: 30 | Fill #1

## 2016-06-18 ENCOUNTER — Ambulatory Visit: Payer: 59 | Attending: Orthopaedic Surgery | Admitting: Physical Therapy

## 2016-06-18 ENCOUNTER — Encounter: Payer: Self-pay | Admitting: Physical Therapy

## 2016-06-18 DIAGNOSIS — R262 Difficulty in walking, not elsewhere classified: Secondary | ICD-10-CM | POA: Diagnosis not present

## 2016-06-18 DIAGNOSIS — M6281 Muscle weakness (generalized): Secondary | ICD-10-CM | POA: Diagnosis not present

## 2016-06-18 DIAGNOSIS — M25661 Stiffness of right knee, not elsewhere classified: Secondary | ICD-10-CM | POA: Insufficient documentation

## 2016-06-18 DIAGNOSIS — M25561 Pain in right knee: Secondary | ICD-10-CM | POA: Diagnosis not present

## 2016-06-21 ENCOUNTER — Encounter: Payer: Self-pay | Admitting: Physical Therapy

## 2016-06-21 ENCOUNTER — Ambulatory Visit: Payer: 59 | Admitting: Physical Therapy

## 2016-06-21 DIAGNOSIS — M25661 Stiffness of right knee, not elsewhere classified: Secondary | ICD-10-CM | POA: Diagnosis not present

## 2016-06-21 DIAGNOSIS — M6281 Muscle weakness (generalized): Secondary | ICD-10-CM | POA: Diagnosis not present

## 2016-06-21 DIAGNOSIS — R262 Difficulty in walking, not elsewhere classified: Secondary | ICD-10-CM | POA: Diagnosis not present

## 2016-06-21 DIAGNOSIS — M25561 Pain in right knee: Secondary | ICD-10-CM | POA: Diagnosis not present

## 2016-06-21 NOTE — Therapy (Signed)
[place], [place], [address] Phone: [phone]   Fax:  [phone]  Physical Therapy Treatment  Patient Details  Name: James Gallegos MRN: [id number] Date of Birth: 1951-02-09 Referring Provider: Dr [name]  Encounter Date: 06/21/2016      PT End of Session - 06/21/16 2031    Visit Number 2   Number of Visits 16   Date for PT Re-Evaluation 08/13/16   Authorization Type [facility]    PT Start Time 0930   PT Stop Time 1022   PT Time Calculation (min) 52 min   Activity Tolerance Patient tolerated treatment well   Behavior During Therapy [facility] for tasks assessed/performed      Past Medical History:  Diagnosis Date  . Arthritis   . Deep vein thrombosis ([place])   . Hypertension   . Murmur, cardiac   . Sleep apnea    CPAP    Past Surgical History:  Procedure Laterality Date  . CERVICAL SPINE SURGERY N/A 1990  . FINGER SURGERY Right 2008   x 3 index finger  . KNEE ARTHROSCOPY Bilateral    L 2004, R 2005  . 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 TOTAL KNEE ARTHROPLASTY Right 06/01/2016   Procedure: TOTAL KNEE ARTHROPLASTY;  Surgeon: [name], MD;  Location: [place];  Service: Orthopedics;  Laterality: Right;    There were no vitals filed for this visit.      Subjective Assessment - 06/21/16 2021    Subjective Patient reports he has been stretching a lot and he is sore now. He continues to o the CPM for several hours a day.    Pertinent History DVT after past knee scope   Limitations Standing;Walking   How long can you sit comfortably? 1/2 hour befor the  knee stiffens    How long can you stand comfortably? < 10 minutes    How long can you walk comfortably? < 300' w/o pain    Diagnostic tests Nothing poist op    Patient Stated Goals To improve ability to walk and to go back to work    Currently in Pain? Yes   Pain Score 5    Pain Location Knee   Pain Orientation Right    Pain Descriptors / Indicators Aching   Pain Type Surgical pain   Pain Onset More than a month ago   Pain Frequency Constant   Aggravating Factors  bending the knee    Pain Relieving Factors rest, ice, alleve    Effect of Pain on Daily Activities difficulty perfroming ADL's             [facility] PT Assessment - 06/21/16 0001      Assessment   Medical Diagnosis Right  Total Knee replacement    Referring Provider Dr [name]   Onset Date/Surgical Date 06/01/16   Hand Dominance Right   Next MD Visit 06/26/2015   Prior Therapy None      Precautions   Precautions None     Restrictions   Weight Bearing Restrictions No     Balance Screen   Has the patient fallen in the past 6 months No   Has the patient had a decrease in activity level because of a fear of falling?  No   Is the patient reluctant to leave their home because of a fear of falling?  No     Home Social worker Private residence   Living  Arrangements Spouse/significant other   Available Help at Discharge Family   Type of Home House   Additional Comments 3 steps into the house      Prior Function   Level of Independence Independent     Cognition   Overall Cognitive Status Within Functional Limits for tasks assessed   Attention Focused   Focused Attention Appears intact   Memory Appears intact   Awareness Appears intact   Problem Solving Appears intact     Observation/Other Assessments   Focus on Therapeutic Outcomes (FOTO)  68% limit      Observation/Other Assessments-Edema    Edema Circumferential     Circumferential Edema   Circumferential - Right 41.8   Circumferential - Left  39.2     Sensation   Light Touch Appears Intact     Coordination   Gross Motor Movements are Fluid and Coordinated Yes   Fine Motor Movements are Fluid and Coordinated Yes     Posture/Postural Control   Posture Comments Stands with bilateral toe out      AROM   Right Knee Extension 13   Right  Knee Flexion 70   Left Knee Extension 0   Left Knee Flexion 114     PROM   Right Knee Extension 10   Right Knee Flexion 74   Left Knee Extension 0   Left Knee Flexion 125     Strength   Right Hip Flexion 3/5   Right Hip ABduction 4/5   Right Hip ADduction 5/5   Left Hip Extension 5/5   Left Hip External Rotation 5/5   Left Hip ADduction 5/5   Right Knee Flexion 4/5   Right Knee Extension 3/5   Left Knee Flexion 5/5   Left Knee Extension 5/5     Palpation   Patella mobility limited patella movement in all planes    Palpation comment No significant tenderness to palpation      Transfers   Comments Some difficulty standing from a low chair      Ambulation/Gait   Gait Comments Decreased single leg stance time on the right. Leans to the left onto the cane; bilateral toe out R > L                      [facility] Adult PT Treatment/Exercise - 06/21/16 2019      Knee/Hip Exercises: Standing   Heel Raises Limitations 2x10    Hip Flexion Limitations Standing hip flexion right only 2x10    Abduction Limitations 2x10 right    Extension Limitations 2x10 right      Knee/Hip Exercises: Supine   Quad Sets Limitations x10    Short Arc Quad Sets Limitations x10    Straight Leg Raises Limitations 2x10      Manual Therapy   Manual therapy comments PROM into flexion and extension to improve range. Grade III and Grade 4 PA and AP mobilizations to improve range. Hamstring release.                   PT Short Term Goals - 06/21/16 2035      PT SHORT TERM GOAL #1   Title Patient will increase right knee passive flexion by 25 degrees    Baseline 83 degrees    Time 4   Period Weeks     PT SHORT TERM GOAL #2   Title Patient will demsotrate full passive right knee extension   Time 4   Period Weeks  Status Revised     PT SHORT TERM GOAL #3   Title Patient will increase gross right lower extremity strength to 4+/5    Time 4   Period Weeks   Status On-going      PT SHORT TERM GOAL #4   Title Patient will walk 400' without an assistive device with minimal antalgic gait    Time 4   Period Weeks   Status On-going     PT SHORT TERM GOAL #5   Title Patient wil be independent with inital HEP    Time 4   Period Weeks   Status On-going           PT Long Term Goals - 06/21/16 1125      PT LONG TERM GOAL #1   Title Patient will increase gorss knee flexion movement to 0-120 degrees without pain in order to get up and down off a low chair.    Time 8   Period Weeks   Status New     PT LONG TERM GOAL #2   Title Patient will ambulate 3000' without increased pain and without an antalgic gait in order to return to work    Time 8   Period Weeks   Status New     PT LONG TERM GOAL #3   Title Patient will be independent with final HEP for LE strength and stability    Time 8   Period Weeks   Status New     PT LONG TERM GOAL #4   Title Patient will demsotrate a 45% limitation on FOTO    Time 8   Period Weeks   Status New     PT LONG TERM GOAL #5   Title Patient will go up/down 8 steps without increased pain in order to perfrom ADL's    Time [place] - 06/21/16 2032    Clinical Impression Statement Patient tolerated treatment wll. His knee flexion increased to 83 degrees. He has increased soreness but his range is porgressing. He reports the CPM has hurt his hip. He was advised that he can continue stretching on his own if the cpm is causing him pain. Therapy added standing exercises with his right side.     Rehab Potential Good   PT Frequency 2x / week   PT Duration 8 weeks   PT Treatment/Interventions ADLs/Self Care Home Management;Cryotherapy;Electrical Stimulation;Gait training;Stair training;Ultrasound;Moist Heat;Iontophoresis 4mg /ml Dexamethasone;Functional mobility training;Therapeutic activities;Therapeutic exercise;Manual techniques;Patient/family education;Passive range of motion;Dry  needling;Taping;Vasopneumatic Device;Neuromuscular re-education   PT Next Visit Plan focus on knee extension activity, may benefit from mobilizations for fleixon, add3 way standing hip, add exercise bike for range of motion    PT Home Exercise Plan SAQ, LAQ, SLR , Quad set,    Consulted and Agree with Plan of Care Patient      Patient will benefit from skilled therapeutic intervention in order to improve the following deficits and impairments:  Abnormal gait, Decreased range of motion, Difficulty walking, Increased muscle spasms, Decreased endurance, Decreased mobility, Decreased strength, Increased edema, Impaired sensation  Visit Diagnosis: Stiffness of right knee, not elsewhere classified  Acute pain of right knee  Difficulty in walking, not elsewhere classified  Muscle weakness (generalized)     Problem List Patient Active Problem List   Diagnosis Date Noted  . Unilateral primary osteoarthritis, right knee 06/01/2016  . S/P total knee  replacement using cement, right 06/01/2016  . Sleep apnea 05/19/2016  . Hypertension 05/19/2016  . Abdominal hernia 05/19/2016    Carney Living PT DPT  06/21/2016, 8:44 PM  [facility] [address] [place], [place], [address] Phone: [phone]   Fax:  [phone]  Name: James Gallegos MRN: [id number] Date of Birth: 02/08/1951

## 2016-06-21 NOTE — Therapy (Signed)
[place], [place], [address] Phone: [phone]   Fax:  [phone]  Physical Therapy Evaluation  Patient Details  Name: James Gallegos MRN: [id number] Date of Birth: 14-Sep-1950 Referring Provider: Dr [name]  Encounter Date: 06/18/2016    Past Medical History:  Diagnosis Date  . Arthritis   . Deep vein thrombosis ([place])   . Hypertension   . Murmur, cardiac   . Sleep apnea    CPAP    Past Surgical History:  Procedure Laterality Date  . CERVICAL SPINE SURGERY N/A 1990  . FINGER SURGERY Right 2008   x 3 index finger  . KNEE ARTHROSCOPY Bilateral    L 2004, R 2005  . 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 TOTAL KNEE ARTHROPLASTY Right 06/01/2016   Procedure: TOTAL KNEE ARTHROPLASTY;  Surgeon: [name], MD;  Location: [facility];  Service: Orthopedics;  Laterality: Right;    There were no vitals filed for this visit.       Subjective Assessment - 06/21/16 1144    Subjective Patient had a left total knee replacement on 06/01/2016. Since that point her reports it is sore but it has been tolerable.  He has a long history of knee pain and has had several knee scopes. He is currently using a cane but at home he does not use a device.    Pertinent History DVT after past knee scope   Limitations Standing;Walking   How long can you sit comfortably? 1/2 hour befor ehte knee stiffnes    How long can you stand comfortably? < 10 minutes    How long can you walk comfortably? < 300' w/o pain    Diagnostic tests Nothing poist op    Patient Stated Goals To improve ability to walk and to go back to work    Currently in Pain? Yes   Pain Score 2    Pain Location Knee   Pain Orientation Right   Pain Descriptors / Indicators Aching   Pain Type Surgical pain   Pain Onset More than a month ago   Pain Frequency Constant   Aggravating Factors  bending the knee    Pain Relieving Factors rest,  ice, alleve    Effect of Pain on Daily Activities difficulty perfroming ADL's             [facility] PT Assessment - 06/21/16 0001      Assessment   Medical Diagnosis Right  Total Knee replacement    Referring Provider Dr [name]   Onset Date/Surgical Date 06/01/16   Hand Dominance Right   Next MD Visit 06/26/2015   Prior Therapy None      Precautions   Precautions None     Restrictions   Weight Bearing Restrictions No     Balance Screen   Has the patient fallen in the past 6 months No   Has the patient had a decrease in activity level because of a fear of falling?  No   Is the patient reluctant to leave their home because of a fear of falling?  No     Home Environment   Living Environment Private residence   Living Arrangements Spouse/significant other   Available Help at Discharge Family   Type of [place]   Additional Comments 3 steps into the house      Prior Function   Level of Independence Independent     Cognition   Overall Cognitive Status Within  Functional Limits for tasks assessed   Attention Focused   Focused Attention Appears intact   Memory Appears intact   Awareness Appears intact   Problem Solving Appears intact     Observation/Other Assessments   Focus on Therapeutic Outcomes (FOTO)  68% limit      Observation/Other Assessments-Edema    Edema Circumferential     Circumferential Edema   Circumferential - Right 41.8   Circumferential - Left  39.2     Sensation   Light Touch Appears Intact     Coordination   Gross Motor Movements are Fluid and Coordinated Yes   Fine Motor Movements are Fluid and Coordinated Yes     Posture/Postural Control   Posture Comments Stands with bilateral toe out      AROM   Right Knee Extension 13   Right Knee Flexion 70   Left Knee Extension 0   Left Knee Flexion 114     PROM   Right Knee Extension 10   Right Knee Flexion 74   Left Knee Extension 0   Left Knee Flexion 125     Strength   Right  Hip Flexion 3/5   Right Hip ABduction 4/5   Right Hip ADduction 5/5   Left Hip Extension 5/5   Left Hip External Rotation 5/5   Left Hip ADduction 5/5   Right Knee Flexion 4/5   Right Knee Extension 3/5   Left Knee Flexion 5/5   Left Knee Extension 5/5     Palpation   Patella mobility limited patella movement in all planes    Palpation comment No significant tenderness to palpation      Transfers   Comments Some difficulty standing from a low chair      Ambulation/Gait   Gait Comments Decreased single leg stance time on the right. Leans to the left onto the cane; bilateral toe out R > L                    [facility] Adult PT Treatment/Exercise - 06/21/16 0001      Knee/Hip Exercises: Stretches   Other Knee/Hip Stretches extension self stretch with heel propped x10 5 sec hold; self stretch with strap      Knee/Hip Exercises: Supine   Quad Sets Limitations x10    Straight Leg Raises Limitations 2x10      Modalities   Modalities Cryotherapy     Cryotherapy   Number Minutes Cryotherapy 10 Minutes     Manual Therapy   Manual therapy comments PROM into flexion and extension to improve range                   PT Short Term Goals - 06/21/16 1005      PT SHORT TERM GOAL #1   Title Patient will increase right knee passive flexion by 25 degrees    Time 4   Period Weeks   Status New     PT SHORT TERM GOAL #2   Title Patient will demsotrate full passive right knee extension   Time 4   Period Weeks   Status New     PT SHORT TERM GOAL #3   Title Patient will increasegorss right lower extremity strength to 4+/5    Time 4   Period Weeks   Status New     PT SHORT TERM GOAL #4   Title Patient will walk 400' without an assistive device with minimal antalgic gait    Time 4  Period Weeks   Status New     PT SHORT TERM GOAL #5   Title Patient wil be independent with inital HEP    Time 4   Period Weeks   Status New           PT Long Term Goals -  06/21/16 1125      PT LONG TERM GOAL #1   Title Patient will increase gorss knee flexion movement to 0-120 degrees without pain in order to get up and down off a low chair.    Time 8   Period Weeks   Status New     PT LONG TERM GOAL #2   Title Patient will ambulate 3000' without increased pain and without an antalgic gait in order to return to work    Time 8   Period Weeks   Status New     PT LONG TERM GOAL #3   Title Patient will be independent with final HEP for LE strength and stability    Time 8   Period Weeks   Status New     PT LONG TERM GOAL #4   Title Patient will demsotrate a 45% limitation on FOTO    Time 8   Period Weeks   Status New     PT LONG TERM GOAL #5   Title Patient will go up/down 8 steps without increased pain in order to perfrom ADL's    Time 8   Period Weeks   Status New               Plan - 06/21/16 1144    Clinical Impression Statement Patient is a 66 year old male S/P right TKA on 06/01/2016. He presents with limited right knee flexion and extension. He has expected weakness in his knee and hip. His knee is effecting his ability to ambulate but it is improving. He is having pain at night. He would benefit from skilled therapy to adress the above deficits and to improve ability to perfrom work tasks and ADL's. He was seen for a low complexity evaluation.    Rehab Potential Good   PT Frequency 2x / week   PT Duration 8 weeks   PT Treatment/Interventions ADLs/Self Care Home Management;Cryotherapy;Electrical Stimulation;Gait training;Stair training;Ultrasound;Moist Heat;Iontophoresis 4mg /ml Dexamethasone;Functional mobility training;Therapeutic activities;Therapeutic exercise;Manual techniques;Patient/family education;Passive range of motion;Dry needling;Taping;Vasopneumatic Device;Neuromuscular re-education   PT Next Visit Plan focus on knee extension activity, may benefit from mobilizations for fleixon, add3 way standing hip, add exercise bike  for range of motion    PT Home Exercise Plan SAQ, LAQ, SLR , Quad set,    Consulted and Agree with Plan of Care Patient      Patient will benefit from skilled therapeutic intervention in order to improve the following deficits and impairments:  Abnormal gait, Decreased range of motion, Difficulty walking, Increased muscle spasms, Decreased endurance, Decreased mobility, Decreased strength, Increased edema, Impaired sensation  Visit Diagnosis: Stiffness of right knee, not elsewhere classified - Plan: PT plan of care cert/re-cert  Acute pain of right knee - Plan: PT plan of care cert/re-cert  Difficulty in walking, not elsewhere classified - Plan: PT plan of care cert/re-cert  Muscle weakness (generalized) - Plan: PT plan of care cert/re-cert     Problem List Patient Active Problem List   Diagnosis Date Noted  . Unilateral primary osteoarthritis, right knee 06/01/2016  . S/P total knee replacement using cement, right 06/01/2016  . Sleep apnea 05/19/2016  . Hypertension 05/19/2016  . Abdominal hernia  05/19/2016    Carney Living PT DPT  06/21/2016, 1:14 PM  [facility] [address] [place], [place], [address] Phone: [phone]   Fax:  [phone]  Name: BYRD BUSS MRN: [id number] Date of Birth: [birth year]

## 2016-06-22 DIAGNOSIS — M1711 Unilateral primary osteoarthritis, right knee: Secondary | ICD-10-CM | POA: Diagnosis not present

## 2016-06-23 ENCOUNTER — Ambulatory Visit: Payer: 59 | Admitting: Physical Therapy

## 2016-06-23 DIAGNOSIS — M25661 Stiffness of right knee, not elsewhere classified: Secondary | ICD-10-CM | POA: Diagnosis not present

## 2016-06-23 DIAGNOSIS — M6281 Muscle weakness (generalized): Secondary | ICD-10-CM

## 2016-06-23 DIAGNOSIS — M25561 Pain in right knee: Secondary | ICD-10-CM

## 2016-06-23 DIAGNOSIS — R262 Difficulty in walking, not elsewhere classified: Secondary | ICD-10-CM

## 2016-06-23 NOTE — Therapy (Signed)
[place], [place], [address] Phone: [phone]   Fax:  [phone]  Physical Therapy Treatment  Patient Details  Name: James Gallegos MRN: [id number] Date of Birth: May 16, 1951 Referring Provider: Dr [name]  Encounter Date: 06/23/2016      PT End of Session - 06/23/16 1102    Visit Number 3   Number of Visits 16   Date for PT Re-Evaluation 08/13/16   Authorization Type [facility]    PT Start Time 1055   PT Stop Time 1155   PT Time Calculation (min) 60 min   Activity Tolerance Patient tolerated treatment well   Behavior During Therapy [facility] for tasks assessed/performed      Past Medical History:  Diagnosis Date  . Arthritis   . Deep vein thrombosis ([place])   . Hypertension   . Murmur, cardiac   . Sleep apnea    CPAP    Past Surgical History:  Procedure Laterality Date  . CERVICAL SPINE SURGERY N/A 1990  . FINGER SURGERY Right 2008   x 3 index finger  . KNEE ARTHROSCOPY Bilateral    L 2004, R 2005  . 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 TOTAL KNEE ARTHROPLASTY Right 06/01/2016   Procedure: TOTAL KNEE ARTHROPLASTY;  Surgeon: [name], MD;  Location: [place];  Service: Orthopedics;  Laterality: Right;    There were no vitals filed for this visit.      Subjective Assessment - 06/23/16 1059    Subjective Patient reports his knee has been sore. He has been stretching his knee on the CPM and stretching his knee with the bike. He had pain last night that woke him up.    Pertinent History DVT after past knee scope   Limitations Standing;Walking   How long can you sit comfortably? 1/2 hour befor the  knee stiffens    How long can you stand comfortably? < 10 minutes    How long can you walk comfortably? < 300' w/o pain    Diagnostic tests Nothing poist op    Patient Stated Goals To improve ability to walk and to go back to work    Currently in Pain? Yes   Pain Score 3    Pain Location Knee   Pain Orientation Right   Pain Descriptors / Indicators Aching   Pain Type Surgical pain   Pain Onset More than a month ago   Pain Frequency Constant   Aggravating Factors  bending the knee    Pain Relieving Factors rest, ice, allieve    Effect of Pain on Daily Activities difficulty performing ADL's    Multiple Pain Sites No            [facility] PT Assessment - 06/23/16 0001      PROM   Right Knee Extension 5   Right Knee Flexion 97                     [facility] Adult PT Treatment/Exercise - 06/23/16 0001      Knee/Hip Exercises: Machines for Strengthening   Total Gym Leg Press 40lb sled 9 2x10      Knee/Hip Exercises: Standing   Step Down Limitations 4"2x10   Functional Squat Limitations 2x10 with cuing for technique      Knee/Hip Exercises: Supine   Straight Leg Raises Limitations 3x10 2lb weight      Modalities   Modalities Cryotherapy     Cryotherapy  Number Minutes Cryotherapy 10 Minutes   Cryotherapy Location Knee   Type of Cryotherapy Ice pack     Manual Therapy   Manual therapy comments PROM into flexion and extension to improve range. Grade III and Grade 4 PA and AP mobilizations to improve range. Hamstring release;  Mulligan strap mobilization to increse flexion in seated.                 PT Education - 06/23/16 1101    Education provided Yes   Education Details HEP, symptom mangement, inmportance of increased flexion/ extension    Person(s) Educated Patient   Methods Explanation;Demonstration   Comprehension Verbalized understanding;Returned demonstration;Verbal cues required          PT Short Term Goals - 06/23/16 1517      PT SHORT TERM GOAL #1   Title Patient will increase right knee passive flexion by 25 degrees    Baseline 87   Time 4   Period Weeks   Status On-going     PT SHORT TERM GOAL #2   Title Patient will demsotrate full passive right knee extension   Time 4   Period Weeks   Status  On-going     PT SHORT TERM GOAL #3   Title Patient will increase gross right lower extremity strength to 4+/5    Time 4   Period Weeks   Status On-going     PT SHORT TERM GOAL #4   Title Patient will walk 400' without an assistive device with minimal antalgic gait    Time 4   Period Weeks   Status On-going     PT SHORT TERM GOAL #5   Title Patient wil be independent with inital HEP    Time 4   Period Weeks   Status On-going           PT Long Term Goals - 06/21/16 1125      PT LONG TERM GOAL #1   Title Patient will increase gorss knee flexion movement to 0-120 degrees without pain in order to get up and down off a low chair.    Time 8   Period Weeks   Status New     PT LONG TERM GOAL #2   Title Patient will ambulate 3000' without increased pain and without an antalgic gait in order to return to work    Time 8   Period Weeks   Status New     PT LONG TERM GOAL #3   Title Patient will be independent with final HEP for LE strength and stability    Time 8   Period Weeks   Status New     PT LONG TERM GOAL #4   Title Patient will demsotrate a 45% limitation on FOTO    Time 8   Period Weeks   Status New     PT LONG TERM GOAL #5   Title Patient will go up/down 8 steps without increased pain in order to perfrom ADL's    Time [place] - 06/23/16 1128    Clinical Impression Statement Patient is making good progress. His knee was initially stiff but it is improving with mobilizations and the work he is doing at home. He is having some soreness but expected soreness. His range was measured at 5-87 after mulligan strap mobilization  Able to add functional strengthening including  stairs and squats today. No significant increase in pain.    PT Frequency 2x / week   PT Duration 8 weeks   PT Treatment/Interventions ADLs/Self Care Home Management;Cryotherapy;Electrical Stimulation;Gait training;Stair training;Ultrasound;Moist  Heat;Iontophoresis 4mg /ml Dexamethasone;Functional mobility training;Therapeutic activities;Therapeutic exercise;Manual techniques;Patient/family education;Passive range of motion;Dry needling;Taping;Vasopneumatic Device;Neuromuscular re-education   PT Next Visit Plan ; add long arc quad with red band and hamstring flexion. Continue with steps. Continue with mobilizations. Continue to focus on regaining range of motion.    PT Home Exercise Plan SAQ, LAQ, SLR , Quad set,    Consulted and Agree with Plan of Care Patient      Patient will benefit from skilled therapeutic intervention in order to improve the following deficits and impairments:  Abnormal gait, Decreased range of motion, Difficulty walking, Increased muscle spasms, Decreased endurance, Decreased mobility, Decreased strength, Increased edema, Impaired sensation  Visit Diagnosis: Stiffness of right knee, not elsewhere classified  Acute pain of right knee  Difficulty in walking, not elsewhere classified  Muscle weakness (generalized)     Problem List Patient Active Problem List   Diagnosis Date Noted  . Unilateral primary osteoarthritis, right knee 06/01/2016  . S/P total knee replacement using cement, right 06/01/2016  . Sleep apnea 05/19/2016  . Hypertension 05/19/2016  . Abdominal hernia 05/19/2016    Carney Living PT DPT  06/23/2016, 3:18 PM  [facility] [address] [place], [place], [address] Phone: [phone]   Fax:  [phone]  Name: James Gallegos MRN: [id number] Date of Birth: 22-Feb-1951

## 2016-06-24 ENCOUNTER — Encounter (INDEPENDENT_AMBULATORY_CARE_PROVIDER_SITE_OTHER): Payer: Self-pay | Admitting: Orthopaedic Surgery

## 2016-06-24 ENCOUNTER — Ambulatory Visit (INDEPENDENT_AMBULATORY_CARE_PROVIDER_SITE_OTHER): Payer: 59 | Admitting: Orthopaedic Surgery

## 2016-06-24 VITALS — Ht 69.5 in | Wt 220.0 lb

## 2016-06-24 DIAGNOSIS — G4733 Obstructive sleep apnea (adult) (pediatric): Secondary | ICD-10-CM | POA: Diagnosis not present

## 2016-06-24 DIAGNOSIS — Z96651 Presence of right artificial knee joint: Secondary | ICD-10-CM

## 2016-06-24 DIAGNOSIS — I10 Essential (primary) hypertension: Secondary | ICD-10-CM | POA: Diagnosis not present

## 2016-06-24 DIAGNOSIS — Z9989 Dependence on other enabling machines and devices: Secondary | ICD-10-CM | POA: Diagnosis not present

## 2016-06-24 MED ORDER — OXYCODONE-ACETAMINOPHEN 5-325 MG PO TABS: 1.0000 | ORAL_TABLET | Freq: Four times a day (QID) | ORAL | 0 refills | Status: DC | PRN

## 2016-06-24 MED FILL — OXYCODONE W/APAP 5/325 TAB: 5-325 | 5 days supply | Qty: 40 | Fill #0

## 2016-06-24 MED FILL — AMLODIPINE BESYLATE 5 MG TA: 5 | 30 days supply | Qty: 30 | Fill #0

## 2016-06-24 MED FILL — EPLERENONE 25 MG TABLET: 25 | 30 days supply | Qty: 30 | Fill #0

## 2016-06-24 NOTE — Progress Notes (Signed)
Office Visit Note   Patient: James Gallegos           Date of Birth: 02-25-1951           MRN: [id number] Visit Date: 06/24/2016              Requested by: No referring provider defined for this encounter. PCP: No PCP Per Patient   Assessment & Plan: Visit Diagnoses:  1. Total knee replacement status, right     Plan:  #1: Continue physical therapy as per protocol #2: Prescription for oxycodone was given to last 2 weeks #3: Use Mederma cream or Cetaphil for the scar #4: Use moist heat initially prior to exercises to the knee and ice afterwards    Follow-Up Instructions: Return in about 2 weeks (around 07/08/2016).   Orders:  No orders of the defined types were placed in this encounter.  Meds ordered this encounter  Medications  . oxyCODONE-acetaminophen (PERCOCET/ROXICET) 5-325 MG tablet    Sig: Take 1-2 tablets by mouth every 6 (six) hours as needed for severe pain.    Dispense:  40 tablet    Refill:  0    Order Specific Question:   Supervising Provider    Answer:   [name] [id number]      Procedures: No procedures performed   Clinical Data: No additional findings.   Subjective: Chief Complaint  Patient presents with  . Right Knee - Routine Post Op    Pt post op Right TKA on 05/21/16. Pt doing well in PT at  83 degrees per his PT. doing his home exercises per protocol. Recent physical therapy note in the chart reveals 5-97 of motion. Denies any calf pain. Using narcotics only for physical therapy and at nighttime when he has problems.    Review of Systems  Constitutional: Negative.   HENT: Negative.   Eyes: Negative.   Respiratory: Negative.   Cardiovascular: Negative.   Gastrointestinal: Negative.   Endocrine: Negative.   Genitourinary: Negative.   Musculoskeletal: Negative.   Skin: Negative.   Allergic/Immunologic: Negative.   Neurological: Negative.   Hematological: Negative.   Psychiatric/Behavioral: Negative.       Objective: Vital Signs: Ht 5' 9.5" (1.765 m)   Wt 220 lb (99.8 kg)   BMI 32.02 kg/m   Physical Exam  Constitutional: He is oriented to person, place, and time. He appears well-developed and well-nourished.  HENT:  Head: Normocephalic and atraumatic.  Eyes: Conjunctivae and EOM are normal. Pupils are equal, round, and reactive to light.  Cardiovascular:  No murmur (grade 1/6 systolic murmur) heard. Pulmonary/Chest: Effort normal.  Abdominal: No hernia (Midline abdominal supraumbilical).  Musculoskeletal:       Right knee: He exhibits effusion ( mild).  Neurological: He is alert and oriented to person, place, and time.  Skin: Skin is warm and dry.  Psychiatric: He has a normal mood and affect. His behavior is normal. Judgment and thought content normal.    Right Knee Exam   Tenderness  The patient is experiencing tenderness in the lateral joint line and medial joint line.  Range of Motion  Extension: 5  Flexion: 90   Tests  Varus: negative Valgus: negative  Other  Other tests: effusion ( mild) present  Comments:  Incision is clean and dry no signs of infection. Steri-Strips removed.      Specialty Comments:  No specialty comments available.  Imaging: No results found.   PMFS History: Patient Active Problem List   Diagnosis  Date Noted  . Unilateral primary osteoarthritis, right knee 06/01/2016  . S/P total knee replacement using cement, right 06/01/2016  . Sleep apnea 05/19/2016  . Hypertension 05/19/2016  . Abdominal hernia 05/19/2016   Past Medical History:  Diagnosis Date  . Arthritis   . Deep vein thrombosis ([place])   . Hypertension   . Murmur, cardiac   . Sleep apnea    CPAP    Family History  Problem Relation Age of Onset  . Alzheimer's disease Father     Past Surgical History:  Procedure Laterality Date  . CERVICAL SPINE SURGERY N/A 1990  . FINGER SURGERY Right 2008   x 3 index finger  . KNEE ARTHROSCOPY Bilateral    L 2004, R  2005  . 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 TOTAL KNEE ARTHROPLASTY Right 06/01/2016   Procedure: TOTAL KNEE ARTHROPLASTY;  Surgeon: [name], MD;  Location: [place];  Service: Orthopedics;  Laterality: Right;   Social History   Occupational History  . Not on file.   Social History Main Topics  . Smoking status: Never Smoker  . Smokeless tobacco: Never Used  . Alcohol use Yes     Comment: rarely  . Drug use: No  . Sexual activity: Not on file

## 2016-06-25 ENCOUNTER — Ambulatory Visit (INDEPENDENT_AMBULATORY_CARE_PROVIDER_SITE_OTHER): Payer: 59 | Admitting: Orthopaedic Surgery

## 2016-06-28 ENCOUNTER — Encounter: Payer: Self-pay | Admitting: Physical Therapy

## 2016-06-28 ENCOUNTER — Ambulatory Visit: Payer: 59 | Admitting: Physical Therapy

## 2016-06-28 DIAGNOSIS — M25561 Pain in right knee: Secondary | ICD-10-CM | POA: Diagnosis not present

## 2016-06-28 DIAGNOSIS — M6281 Muscle weakness (generalized): Secondary | ICD-10-CM | POA: Diagnosis not present

## 2016-06-28 DIAGNOSIS — M25661 Stiffness of right knee, not elsewhere classified: Secondary | ICD-10-CM

## 2016-06-28 DIAGNOSIS — R262 Difficulty in walking, not elsewhere classified: Secondary | ICD-10-CM

## 2016-06-28 NOTE — Therapy (Signed)
[place], [place], [address] Phone: [phone]   Fax:  [phone]  Physical Therapy Treatment  Patient Details  Name: James Gallegos MRN: [id number] Date of Birth: Sep 05, 1950 Referring Provider: Dr [name]  Encounter Date: 06/28/2016      PT End of Session - 06/28/16 2022    Visit Number 4   Number of Visits 16   Date for PT Re-Evaluation 08/13/16   Authorization Type [facility]    PT Start Time 1103   PT Stop Time 1155   PT Time Calculation (min) 52 min   Activity Tolerance Patient tolerated treatment well   Behavior During Therapy [facility] for tasks assessed/performed      Past Medical History:  Diagnosis Date  . Arthritis   . Deep vein thrombosis ([place])   . Hypertension   . Murmur, cardiac   . Sleep apnea    CPAP    Past Surgical History:  Procedure Laterality Date  . CERVICAL SPINE SURGERY N/A 1990  . FINGER SURGERY Right 2008   x 3 index finger  . KNEE ARTHROSCOPY Bilateral    L 2004, R 2005  . 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 TOTAL KNEE ARTHROPLASTY Right 06/01/2016   Procedure: TOTAL KNEE ARTHROPLASTY;  Surgeon: [name], MD;  Location: [place];  Service: Orthopedics;  Laterality: Right;    There were no vitals filed for this visit.      Subjective Assessment - 06/28/16 1106    Subjective Patient reports improved pain over the weeked. He has been to the doctor who imsturcted him to focus on extension. He continues to work on stretching and strengthening at home.    Pertinent History DVT after past knee scope   Limitations Standing;Walking   How long can you sit comfortably? 1/2 hour befor the  knee stiffens    How long can you stand comfortably? < 10 minutes    How long can you walk comfortably? < 300' w/o pain    Currently in Pain? Yes   Pain Score 2    Pain Location Knee   Pain Orientation Right   Pain Descriptors / Indicators Aching   Pain Type  Surgical pain   Pain Onset More than a month ago   Pain Frequency Constant   Aggravating Factors  bending the knee    Pain Relieving Factors rest, ice allieve    Effect of Pain on Daily Activities difficulty perfroming ADL's                          [facility] Adult PT Treatment/Exercise - 06/28/16 0001      Knee/Hip Exercises: Machines for Strengthening   Total Gym Leg Press 40lb sled 9 1x10 sled 8     Knee/Hip Exercises: Standing   Heel Raises Limitations 2x10    Hip Flexion Limitations Standing hip flexion right only 2x10    Step Down Limitations 4"2x10   Functional Squat Limitations 2x10 with cuing for technique      Knee/Hip Exercises: Supine   Straight Leg Raises Limitations 3x10 2lb weight      Modalities   Modalities Cryotherapy     Cryotherapy   Number Minutes Cryotherapy 10 Minutes   Cryotherapy Location Knee   Type of Cryotherapy Ice pack     Manual Therapy   Manual therapy comments PROM into flexion and extension to improve range. Grade III and Grade 4 PA and  AP mobilizations to improve range. Hamstring release;  Mulligan strap mobilization to increse flexion in seated  with heat.                 PT Education - 06/28/16 2021    Education provided Yes   Education Details HEP symptom mangement, strengthening    Person(s) Educated Patient   Methods Explanation;Demonstration   Comprehension Verbalized understanding;Returned demonstration;Verbal cues required          PT Short Term Goals - 06/28/16 2024      PT SHORT TERM GOAL #1   Title Patient will increase right knee passive flexion by 25 degrees    Baseline 87   Time 4   Period Weeks   Status On-going     PT SHORT TERM GOAL #2   Title Patient will demsotrate full passive right knee extension   Time 4   Period Weeks   Status On-going     PT SHORT TERM GOAL #3   Title Patient will increase gross right lower extremity strength to 4+/5    Time 4   Period Weeks   Status  On-going     PT SHORT TERM GOAL #4   Title Patient will walk 400' without an assistive device with minimal antalgic gait    Baseline still using a canr    Time 4   Period Weeks   Status On-going     PT SHORT TERM GOAL #5   Title Patient wil be independent with inital HEP    Time 4   Period Weeks   Status On-going           PT Long Term Goals - 06/21/16 1125      PT LONG TERM GOAL #1   Title Patient will increase gorss knee flexion movement to 0-120 degrees without pain in order to get up and down off a low chair.    Time 8   Period Weeks   Status New     PT LONG TERM GOAL #2   Title Patient will ambulate 3000' without increased pain and without an antalgic gait in order to return to work    Time 8   Period Weeks   Status New     PT LONG TERM GOAL #3   Title Patient will be independent with final HEP for LE strength and stability    Time 8   Period Weeks   Status New     PT LONG TERM GOAL #4   Title Patient will demsotrate a 45% limitation on FOTO    Time 8   Period Weeks   Status New     PT LONG TERM GOAL #5   Title Patient will go up/down 8 steps without increased pain in order to perfrom ADL's    Time 8   Period Weeks   Status New               Plan - 06/28/16 2023    Clinical Impression Statement Patients range of motion broke 90 degrees today. 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 He felt some pain at the end of the treatment with leg press. Over all he continues to make good progress.    Rehab Potential Good   PT Frequency 2x / week   PT Duration 8 weeks   PT Treatment/Interventions ADLs/Self Care Home Management;Cryotherapy;Electrical Stimulation;Gait training;Stair training;Ultrasound;Moist Heat;Iontophoresis 4mg /ml Dexamethasone;Functional mobility training;Therapeutic activities;Therapeutic exercise;Manual techniques;Patient/family education;Passive range of motion;Dry needling;Taping;Vasopneumatic Device;Neuromuscular re-education   PT  Next Visit Plan ; add long arc quad with red band and hamstring flexion. Continue with steps. Continue with mobilizations. Continue to focus on regaining range of motion.    PT Home Exercise Plan SAQ, LAQ, SLR , Quad set,    Consulted and Agree with Plan of Care Patient      Patient will benefit from skilled therapeutic intervention in order to improve the following deficits and impairments:  Abnormal gait, Decreased range of motion, Difficulty walking, Increased muscle spasms, Decreased endurance, Decreased mobility, Decreased strength, Increased edema, Impaired sensation  Visit Diagnosis: Stiffness of right knee, not elsewhere classified  Acute pain of right knee  Difficulty in walking, not elsewhere classified  Muscle weakness (generalized)     Problem List Patient Active Problem List   Diagnosis Date Noted  . Unilateral primary osteoarthritis, right knee 06/01/2016  . S/P total knee replacement using cement, right 06/01/2016  . Sleep apnea 05/19/2016  . Hypertension 05/19/2016  . Abdominal hernia 05/19/2016    Carney Living  PT DPT  06/28/2016, 8:29 PM  [facility] [address] [place], [place], [address] Phone: [phone]   Fax:  [phone]  Name: James Gallegos MRN: [id number] Date of Birth: Mar 07, 1951

## 2016-07-01 ENCOUNTER — Ambulatory Visit: Payer: 59 | Admitting: Physical Therapy

## 2016-07-05 ENCOUNTER — Ambulatory Visit: Payer: 59 | Admitting: Physical Therapy

## 2016-07-05 ENCOUNTER — Encounter: Payer: Self-pay | Admitting: Physical Therapy

## 2016-07-05 DIAGNOSIS — M25561 Pain in right knee: Secondary | ICD-10-CM

## 2016-07-05 DIAGNOSIS — M6281 Muscle weakness (generalized): Secondary | ICD-10-CM

## 2016-07-05 DIAGNOSIS — M25661 Stiffness of right knee, not elsewhere classified: Secondary | ICD-10-CM

## 2016-07-05 DIAGNOSIS — R262 Difficulty in walking, not elsewhere classified: Secondary | ICD-10-CM | POA: Diagnosis not present

## 2016-07-05 NOTE — Therapy (Signed)
[place], [place], [address] Phone: [phone]   Fax:  [phone]  Physical Therapy Treatment  Patient Details  Name: James Gallegos MRN: [id number] Date of Birth: [birth year] Referring Provider: Dr [name]  Encounter Date: 07/05/2016      PT End of Session - 07/05/16 1322    Visit Number 5   Number of Visits 16   Date for PT Re-Evaluation 08/13/16   PT Start Time 1017   PT Stop Time 1059   PT Time Calculation (min) 42 min   Activity Tolerance Patient tolerated treatment well   Behavior During Therapy [facility] for tasks assessed/performed      Past Medical History:  Diagnosis Date  . Arthritis   . Deep vein thrombosis ([place])   . Hypertension   . Murmur, cardiac   . Sleep apnea    CPAP    Past Surgical History:  Procedure Laterality Date  . CERVICAL SPINE SURGERY N/A 1990  . FINGER SURGERY Right 2008   x 3 index finger  . KNEE ARTHROSCOPY Bilateral    L 2004, R 2005  . 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 TOTAL KNEE ARTHROPLASTY Right 06/01/2016   Procedure: TOTAL KNEE ARTHROPLASTY;  Surgeon: [name], MD;  Location: [place];  Service: Orthopedics;  Laterality: Right;    There were no vitals filed for this visit.      Subjective Assessment - 07/05/16 1022    Subjective Was able to walk more over the weekend.  i was not able to exercise Sunday.  I have been able to keep my swelling under control.  Does not use cane often.`   Currently in Pain? Yes   Pain Score 5    Pain Location Knee   Pain Orientation Right   Pain Descriptors / Indicators Aching  nerve pain   Pain Type Surgical pain   Pain Onset More than a month ago   Pain Frequency Constant   Aggravating Factors  extra time on your feet   Pain Relieving Factors rest.  ice ,    Effect of Pain on Daily Activities Wakes up at night. Limits ADL's.   Multiple Pain Sites No            [facility] PT Assessment  - 07/05/16 0001      PROM   Right Knee Extension --  2 finger's width off mat                     [facility] Adult PT Treatment/Exercise - 07/05/16 0001      Self-Care   Self-Care --  desentization how to     Knee/Hip Exercises: Stretches   Active Hamstring Stretch 3 reps;10 seconds   Active Hamstring Stretch Limitations how to do at home   Quad Stretch 10 seconds  10 X foot on step   Knee: Self-Stretch to increase Flexion 10 seconds  10x     Knee/Hip Exercises: Aerobic   Nustep 6 minutes,  L6 lowering seat X 1 to increase stretch     Knee/Hip Exercises: Standing   Functional Squat Limitations 10   Gait Training minor cues     Knee/Hip Exercises: Supine   Quad Sets Limitations 10     Manual Therapy   Manual therapy comments strap mobilization for flexion, retrograde soft tissue work , for edema,  patellar mobs , passive stretching  PT Education - 07/05/16 1322    Education provided Yes   Education Details self care   Person(s) Educated Patient   Methods Explanation;Demonstration   Comprehension Verbalized understanding          PT Short Term Goals - 07/05/16 1326      PT SHORT TERM GOAL #1   Title Patient will increase right knee passive flexion by 25 degrees    Time 4   Period Weeks   Status On-going     PT SHORT TERM GOAL #2   Title Patient will demsotrate full passive right knee extension   Baseline lacks 2 finger widths   Time 4   Period Weeks   Status On-going     PT SHORT TERM GOAL #3   Title Patient will increase gross right lower extremity strength to 4+/5    Time 4   Period Weeks   Status Unable to assess     PT SHORT TERM GOAL #4   Title Patient will walk 400' without an assistive device with minimal antalgic gait    Baseline intermittant cane   Time 4   Period Weeks   Status On-going     PT SHORT TERM GOAL #5   Title Patient wil be independent with inital HEP    Time 4   Period Weeks   Status  Unable to assess           PT Long Term Goals - 06/21/16 1125      PT LONG TERM GOAL #1   Title Patient will increase gorss knee flexion movement to 0-120 degrees without pain in order to get up and down off a low chair.    Time 8   Period Weeks   Status New     PT LONG TERM GOAL #2   Title Patient will ambulate 3000' without increased pain and without an antalgic gait in order to return to work    Time 8   Period Weeks   Status New     PT LONG TERM GOAL #3   Title Patient will be independent with final HEP for LE strength and stability    Time 8   Period Weeks   Status New     PT LONG TERM GOAL #4   Title Patient will demsotrate a 45% limitation on FOTO    Time 8   Period Weeks   Status New     PT LONG TERM GOAL #5   Title Patient will go up/down 8 steps without increased pain in order to perfrom ADL's    Time [place] - 07/05/16 1323    Clinical Impression Statement RPOM continues to be stiff,  Extension 2 finger's width off mat,  Knee flexion  around 94.  Less pain at end of session.  He declined the need of modalities,  progress toward ROM goal.  No new goals met.    PT Next Visit Plan ; add long arc quad with red band and hamstring flexion. Continue with steps. Continue with mobilizations. Continue to focus on regaining range of motion.    PT Home Exercise Plan SAQ, LAQ, SLR , Quad set,    Consulted and Agree with Plan of Care Patient      Patient will benefit from skilled therapeutic intervention in order to improve the following deficits and impairments:  Abnormal gait,  Decreased range of motion, Difficulty walking, Increased muscle spasms, Decreased endurance, Decreased mobility, Decreased strength, Increased edema, Impaired sensation  Visit Diagnosis: Stiffness of right knee, not elsewhere classified  Acute pain of right knee  Difficulty in walking, not elsewhere classified  Muscle weakness  (generalized)     Problem List Patient Active Problem List   Diagnosis Date Noted  . Unilateral primary osteoarthritis, right knee 06/01/2016  . S/P total knee replacement using cement, right 06/01/2016  . Sleep apnea 05/19/2016  . Hypertension 05/19/2016  . Abdominal hernia 05/19/2016    [name] PTA 07/05/2016, 1:29 PM  [facility] [address] [place], [place], [address] Phone: [phone]   Fax:  [phone]  Name: James Gallegos MRN: [id number] Date of Birth: 11-20-1950

## 2016-07-05 NOTE — Patient Instructions (Signed)
How to desentize skin

## 2016-07-06 DIAGNOSIS — G4733 Obstructive sleep apnea (adult) (pediatric): Secondary | ICD-10-CM | POA: Diagnosis not present

## 2016-07-07 ENCOUNTER — Encounter: Payer: Self-pay | Admitting: Physical Therapy

## 2016-07-07 ENCOUNTER — Ambulatory Visit: Payer: 59 | Admitting: Physical Therapy

## 2016-07-07 DIAGNOSIS — M25661 Stiffness of right knee, not elsewhere classified: Secondary | ICD-10-CM

## 2016-07-07 DIAGNOSIS — R262 Difficulty in walking, not elsewhere classified: Secondary | ICD-10-CM | POA: Diagnosis not present

## 2016-07-07 DIAGNOSIS — M25561 Pain in right knee: Secondary | ICD-10-CM

## 2016-07-07 DIAGNOSIS — M6281 Muscle weakness (generalized): Secondary | ICD-10-CM

## 2016-07-07 NOTE — Therapy (Signed)
[place], [place], [address] Phone: [phone]   Fax:  [phone]  Physical Therapy Treatment  Patient Details  Name: James Gallegos MRN: [id number] Date of Birth: [birth year] Referring Provider: Dr [name]  Encounter Date: 07/07/2016      PT End of Session - 07/07/16 1143    Visit Number 6   Number of Visits 16   Date for PT Re-Evaluation 08/13/16   Authorization Type [facility]    PT Start Time 1104   PT Stop Time 1155   PT Time Calculation (min) 51 min   Activity Tolerance Patient tolerated treatment well   Behavior During Therapy [facility] for tasks assessed/performed      Past Medical History:  Diagnosis Date  . Arthritis   . Deep vein thrombosis ([place])   . Hypertension   . Murmur, cardiac   . Sleep apnea    CPAP    Past Surgical History:  Procedure Laterality Date  . CERVICAL SPINE SURGERY N/A 1990  . FINGER SURGERY Right 2008   x 3 index finger  . KNEE ARTHROSCOPY Bilateral    L 2004, R 2005  . 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 TOTAL KNEE ARTHROPLASTY Right 06/01/2016   Procedure: TOTAL KNEE ARTHROPLASTY;  Surgeon: [name], MD;  Location: [place];  Service: Orthopedics;  Laterality: Right;    There were no vitals filed for this visit.      Subjective Assessment - 07/07/16 1103    Subjective Patient reports that when he left Monday he felt very loose. He feels like he stiffend up over the past few days. He has been walking without his cane.    Pertinent History DVT after past knee scope   Limitations Standing;Walking   How long can you sit comfortably? 1/2 hour befor the  knee stiffens    How long can you stand comfortably? < 10 minutes    How long can you walk comfortably? < 300' w/o pain    Diagnostic tests Nothing poist op    Patient Stated Goals To improve ability to walk and to go back to work    Currently in Pain? Yes   Pain Score 2    Pain  Location Knee   Pain Orientation Right   Pain Descriptors / Indicators Aching   Pain Type Surgical pain   Pain Onset More than a month ago   Pain Frequency Constant   Aggravating Factors  extra time on your feet    Pain Relieving Factors rest, ice    Effect of Pain on Daily Activities limited ability to ambulate                          [facility] Adult PT Treatment/Exercise - 07/07/16 0001      Knee/Hip Exercises: Stretches   Active Hamstring Stretch 3 reps;10 seconds   Active Hamstring Stretch Limitations how to do at home     Knee/Hip Exercises: Aerobic   Nustep 6 minutes,  L6 lowering seat X 1 to increase stretch     Knee/Hip Exercises: Machines for Strengthening   Total Gym Leg Press 60 lb sled 9     Knee/Hip Exercises: Standing   Heel Raises Limitations Not going to perfrom anymore because of his foot    Hip Flexion Limitations Standing hip flexion right only 2x10    Lateral Step Up Limitations 8" x20    Forward Step Up  Limitations 8"x20   Functional Squat Limitations x20    Other Standing Knee Exercises SLS 3x10 sec R                 PT Education - 07/07/16 1608    Education provided Yes   Education Details progression of range of motion    Person(s) Educated Patient   Methods Explanation;Demonstration   Comprehension Verbalized understanding          PT Short Term Goals - 07/05/16 1326      PT SHORT TERM GOAL #1   Title Patient will increase right knee passive flexion by 25 degrees    Time 4   Period Weeks   Status On-going     PT SHORT TERM GOAL #2   Title Patient will demsotrate full passive right knee extension   Baseline lacks 2 finger widths   Time 4   Period Weeks   Status On-going     PT SHORT TERM GOAL #3   Title Patient will increase gross right lower extremity strength to 4+/5    Time 4   Period Weeks   Status Unable to assess     PT SHORT TERM GOAL #4   Title Patient will walk 400' without an assistive device  with minimal antalgic gait    Baseline intermittant cane   Time 4   Period Weeks   Status On-going     PT SHORT TERM GOAL #5   Title Patient wil be independent with inital HEP    Time 4   Period Weeks   Status Unable to assess           PT Long Term Goals - 06/21/16 1125      PT LONG TERM GOAL #1   Title Patient will increase gorss knee flexion movement to 0-120 degrees without pain in order to get up and down off a low chair.    Time 8   Period Weeks   Status New     PT LONG TERM GOAL #2   Title Patient will ambulate 3000' without increased pain and without an antalgic gait in order to return to work    Time 8   Period Weeks   Status New     PT LONG TERM GOAL #3   Title Patient will be independent with final HEP for LE strength and stability    Time 8   Period Weeks   Status New     PT LONG TERM GOAL #4   Title Patient will demsotrate a 45% limitation on FOTO    Time 8   Period Weeks   Status New     PT LONG TERM GOAL #5   Title Patient will go up/down 8 steps without increased pain in order to perfrom ADL's    Time 8   Period Weeks   Status New               Plan - 07/07/16 1610    Clinical Impression Statement PROM measured at 97 degrees. No significant pain with treatment. Added single leg stance. Extension is improving. He progressed his weight with the leg press.    Rehab Potential Good   PT Frequency 2x / week   PT Duration 8 weeks   PT Treatment/Interventions ADLs/Self Care Home Management;Cryotherapy;Electrical Stimulation;Gait training;Stair training;Ultrasound;Moist Heat;Iontophoresis 4mg /ml Dexamethasone;Functional mobility training;Therapeutic activities;Therapeutic exercise;Manual techniques;Patient/family education;Passive range of motion;Dry needling;Taping;Vasopneumatic Device;Neuromuscular re-education   PT Next Visit Plan ; add long arc quad with red  band and hamstring flexion. Continue with steps. Continue with mobilizations.  Continue to focus on regaining range of motion. Add LAQ and Hamstring curl with t-BAND    PT Home Exercise Plan SAQ, LAQ, SLR , Quad set,    Consulted and Agree with Plan of Care Patient      Patient will benefit from skilled therapeutic intervention in order to improve the following deficits and impairments:  Abnormal gait, Decreased range of motion, Difficulty walking, Increased muscle spasms, Decreased endurance, Decreased mobility, Decreased strength, Increased edema, Impaired sensation  Visit Diagnosis: Stiffness of right knee, not elsewhere classified  Acute pain of right knee  Difficulty in walking, not elsewhere classified  Muscle weakness (generalized)     Problem List Patient Active Problem List   Diagnosis Date Noted  . Unilateral primary osteoarthritis, right knee 06/01/2016  . S/P total knee replacement using cement, right 06/01/2016  . Sleep apnea 05/19/2016  . Hypertension 05/19/2016  . Abdominal hernia 05/19/2016    Carney Living PT DPT  07/07/2016, 5:04 PM  [facility] [address] [place], [place], [address] Phone: [phone]   Fax:  [phone]  Name: James Gallegos MRN: [id number] Date of Birth: 02/19/1951

## 2016-07-09 MED FILL — DICLOFENAC SOD 75 MG TAB EC: 75 | 30 days supply | Qty: 60 | Fill #2

## 2016-07-12 ENCOUNTER — Encounter: Payer: Self-pay | Admitting: Physical Therapy

## 2016-07-12 ENCOUNTER — Ambulatory Visit: Payer: 59 | Admitting: Physical Therapy

## 2016-07-12 ENCOUNTER — Ambulatory Visit (INDEPENDENT_AMBULATORY_CARE_PROVIDER_SITE_OTHER): Payer: 59 | Admitting: Orthopaedic Surgery

## 2016-07-12 ENCOUNTER — Encounter (INDEPENDENT_AMBULATORY_CARE_PROVIDER_SITE_OTHER): Payer: Self-pay | Admitting: Orthopaedic Surgery

## 2016-07-12 VITALS — BP 163/82 | HR 66 | Ht 69.5 in | Wt 220.0 lb

## 2016-07-12 DIAGNOSIS — M25661 Stiffness of right knee, not elsewhere classified: Secondary | ICD-10-CM

## 2016-07-12 DIAGNOSIS — Z96651 Presence of right artificial knee joint: Secondary | ICD-10-CM

## 2016-07-12 DIAGNOSIS — M25561 Pain in right knee: Secondary | ICD-10-CM | POA: Diagnosis not present

## 2016-07-12 DIAGNOSIS — M6281 Muscle weakness (generalized): Secondary | ICD-10-CM | POA: Diagnosis not present

## 2016-07-12 DIAGNOSIS — R262 Difficulty in walking, not elsewhere classified: Secondary | ICD-10-CM | POA: Diagnosis not present

## 2016-07-12 MED ORDER — DICLOFENAC SODIUM 2 % TD SOLN: 2.0000 g | Freq: Four times a day (QID) | TRANSDERMAL | 2 refills | Status: DC | PRN

## 2016-07-12 MED ORDER — OXYCODONE HCL 5 MG PO CAPS: 5.0000 mg | ORAL_CAPSULE | ORAL | 0 refills | Status: DC | PRN

## 2016-07-12 MED FILL — oxyCODONE HCL 5 MG TABS: 5 | 3 days supply | Qty: 20 | Fill #0

## 2016-07-12 NOTE — Progress Notes (Signed)
   Office Visit Note   Patient: James Gallegos           Date of Birth: 1951-04-29           MRN: [id number] Visit Date: 07/12/2016              Requested by: No referring provider defined for this encounter. PCP: No PCP Per Patient   Assessment & Plan: Visit Diagnoses: 5 weeks s/p right TKR-making progress with flexion  Plan: office 1 month, renew oxycodone, Rx for pennsaid-long discussion re manipulation vs continues PT-wishes to proceed with PT  Follow-Up Instructions: No Follow-up on file.   Orders:  No orders of the defined types were placed in this encounter.  No orders of the defined types were placed in this encounter.     Procedures: No procedures performed   Clinical Data: No additional findings.   Subjective: No chief complaint on file.   Pt presents 6 weeks status post Right TKA on 06/01/16.  Pt still in PT to gain more ROM.   Emersen relates that PT has flexed his right knee to 98 degrees. Taking pain med once a day. Pennsaid helps  Review of Systems   Objective: Vital Signs: There were no vitals taken for this visit.  Physical Exam  Ortho Examright knee exam with 0-98 degrees ROM. Incision clean,dry. No distal swelling. Neuro vascular intact. No instability or calf pain Specialty Comments:  No specialty comments available.  Imaging: No results found.   PMFS History: Patient Active Problem List   Diagnosis Date Noted  . Unilateral primary osteoarthritis, right knee 06/01/2016  . S/P total knee replacement using cement, right 06/01/2016  . Sleep apnea 05/19/2016  . Hypertension 05/19/2016  . Abdominal hernia 05/19/2016   Past Medical History:  Diagnosis Date  . Arthritis   . Deep vein thrombosis ([place])   . Hypertension   . Murmur, cardiac   . Sleep apnea    CPAP    Family History  Problem Relation Age of Onset  . Alzheimer's disease Father     Past Surgical History:  Procedure Laterality Date  . CERVICAL SPINE SURGERY N/A  1990  . FINGER SURGERY Right 2008   x 3 index finger  . KNEE ARTHROSCOPY Bilateral    L 2004, R 2005  . 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 TOTAL KNEE ARTHROPLASTY Right 06/01/2016   Procedure: TOTAL KNEE ARTHROPLASTY;  Surgeon: [name], MD;  Location: [place];  Service: Orthopedics;  Laterality: Right;   Social History   Occupational History  . Not on file.   Social History Main Topics  . Smoking status: Never Smoker  . Smokeless tobacco: Never Used  . Alcohol use Yes     Comment: rarely  . Drug use: No  . Sexual activity: Not on file

## 2016-07-12 NOTE — Therapy (Signed)
[place], [place], [address] Phone: [phone]   Fax:  [phone]  Physical Therapy Treatment  Patient Details  Name: James Gallegos MRN: [id number] Date of Birth: [birth year] Referring Provider: Dr [name]  Encounter Date: 07/12/2016      PT End of Session - 07/12/16 1123    Visit Number 7   Number of Visits 16   Date for PT Re-Evaluation 08/13/16   Authorization Type [facility] [employer]    PT Start Time 1102   PT Stop Time 1145   PT Time Calculation (min) 43 min   Activity Tolerance Patient tolerated treatment well   Behavior During Therapy [facility] for tasks assessed/performed      Past Medical History:  Diagnosis Date  . Arthritis   . Deep vein thrombosis ([place])   . Hypertension   . Murmur, cardiac   . Sleep apnea    CPAP    Past Surgical History:  Procedure Laterality Date  . CERVICAL SPINE SURGERY N/A 1990  . FINGER SURGERY Right 2008   x 3 index finger  . KNEE ARTHROSCOPY Bilateral    L 2004, R 2005  . 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 TOTAL KNEE ARTHROPLASTY Right 06/01/2016   Procedure: TOTAL KNEE ARTHROPLASTY;  Surgeon: [name], MD;  Location: [place];  Service: Orthopedics;  Laterality: Right;    There were no vitals filed for this visit.      Subjective Assessment - 07/12/16 1114    Subjective Patient went to the MD. The MD gave him the choice of having a manipulation or continueing with therapy. He got to 100 degrees in the office with the MD. The MD reported he only has a 1 week window to have a mnipulation. He is having no pain today he is just sore. He  was able to go out and do light work hte other day without pain or any instability on uneven ground.    Pertinent History DVT after past knee scope   Limitations Standing;Walking   How long can you sit comfortably? 1/2 hour befor the  knee stiffens    How long can you stand comfortably? < 10 minutes    How  long can you walk comfortably? < 300' w/o pain                          [facility] Adult PT Treatment/Exercise - 07/12/16 0001      Knee/Hip Exercises: Stretches   Active Hamstring Stretch 3 reps;10 seconds   Active Hamstring Stretch Limitations how to do at home     Knee/Hip Exercises: Aerobic   Nustep 6 minutes,  L6 lowering seat X 1 to increase stretch     Knee/Hip Exercises: Machines for Strengthening   Total Gym Leg Press 60 lb sled 8 2x10      Knee/Hip Exercises: Standing   Heel Raises Limitations Not going to perfrom anymore because of his foot    Hip Flexion Limitations Standing hip flexion right only 2x10    Forward Step Up Limitations 8"x20   Step Down Limitations 4"2x10   Functional Squat Limitations x20    Other Standing Knee Exercises SLS 3x15 sec R      Knee/Hip Exercises: Seated   Long Arc Quad Limitations 2x10 red    Hamstring Limitations 2x10 red      Cryotherapy   Number Minutes Cryotherapy 10 Minutes   Cryotherapy Location Knee  Type of Cryotherapy Ice pack     Manual Therapy   Manual therapy comments strap mobilization for flexion, retrograde soft tissue work , for edema,  patellar mobs , passive stretching                  PT Short Term Goals - 07/05/16 1326      PT SHORT TERM GOAL #1   Title Patient will increase right knee passive flexion by 25 degrees    Time 4   Period Weeks   Status On-going     PT SHORT TERM GOAL #2   Title Patient will demsotrate full passive right knee extension   Baseline lacks 2 finger widths   Time 4   Period Weeks   Status On-going     PT SHORT TERM GOAL #3   Title Patient will increase gross right lower extremity strength to 4+/5    Time 4   Period Weeks   Status Unable to assess     PT SHORT TERM GOAL #4   Title Patient will walk 400' without an assistive device with minimal antalgic gait    Baseline intermittant cane   Time 4   Period Weeks   Status On-going     PT SHORT TERM  GOAL #5   Title Patient wil be independent with inital HEP    Time 4   Period Weeks   Status Unable to assess           PT Long Term Goals - 06/21/16 1125      PT LONG TERM GOAL #1   Title Patient will increase gorss knee flexion movement to 0-120 degrees without pain in order to get up and down off a low chair.    Time 8   Period Weeks   Status New     PT LONG TERM GOAL #2   Title Patient will ambulate 3000' without increased pain and without an antalgic gait in order to return to work    Time 8   Period Weeks   Status New     PT LONG TERM GOAL #3   Title Patient will be independent with final HEP for LE strength and stability    Time 8   Period Weeks   Status New     PT LONG TERM GOAL #4   Title Patient will demsotrate a 45% limitation on FOTO    Time 8   Period Weeks   Status New     PT LONG TERM GOAL #5   Title Patient will go up/down 8 steps without increased pain in order to perfrom ADL's    Time 8   Period Weeks   Status New               Plan - 07/12/16 1235    Clinical Impression Statement Patients range continues to increase. He reached 103 degrees with stretching. He is having very little pain just stiffness. Patient worked on step downs. He is making good progress.    PT Frequency 2x / week   PT Duration 8 weeks   PT Treatment/Interventions ADLs/Self Care Home Management;Cryotherapy;Electrical Stimulation;Gait training;Stair training;Ultrasound;Moist Heat;Iontophoresis 4mg /ml Dexamethasone;Functional mobility training;Therapeutic activities;Therapeutic exercise;Manual techniques;Patient/family education;Passive range of motion;Dry needling;Taping;Vasopneumatic Device;Neuromuscular re-education   PT Next Visit Plan ; add long arc quad with red band and hamstring flexion. Continue with steps. Continue with mobilizations. Continue to focus on regaining range of motion. Add LAQ and Hamstring curl with t-BAND    PT Home  Exercise Plan SAQ, LAQ, SLR ,  Quad set,    Consulted and Agree with Plan of Care Patient      Patient will benefit from skilled therapeutic intervention in order to improve the following deficits and impairments:     Visit Diagnosis: Stiffness of right knee, not elsewhere classified  Acute pain of right knee  Difficulty in walking, not elsewhere classified  Muscle weakness (generalized)     Problem List Patient Active Problem List   Diagnosis Date Noted  . Unilateral primary osteoarthritis, right knee 06/01/2016  . S/P total knee replacement using cement, right 06/01/2016  . Sleep apnea 05/19/2016  . Hypertension 05/19/2016  . Abdominal hernia 05/19/2016    Carney Living 07/12/2016, 12:43 PM  [facility] [address] [place], [place], [address] Phone: [phone]   Fax:  [phone]  Name: James Gallegos MRN: [id number] Date of Birth: May 07, 1951

## 2016-07-14 ENCOUNTER — Encounter: Payer: Self-pay | Admitting: Physical Therapy

## 2016-07-14 ENCOUNTER — Ambulatory Visit: Payer: 59 | Admitting: Physical Therapy

## 2016-07-14 DIAGNOSIS — R262 Difficulty in walking, not elsewhere classified: Secondary | ICD-10-CM | POA: Diagnosis not present

## 2016-07-14 DIAGNOSIS — M25561 Pain in right knee: Secondary | ICD-10-CM | POA: Diagnosis not present

## 2016-07-14 DIAGNOSIS — M6281 Muscle weakness (generalized): Secondary | ICD-10-CM | POA: Diagnosis not present

## 2016-07-14 DIAGNOSIS — M25661 Stiffness of right knee, not elsewhere classified: Secondary | ICD-10-CM | POA: Diagnosis not present

## 2016-07-14 NOTE — Therapy (Signed)
[place], [place], [address] Phone: [phone]   Fax:  [phone]  Physical Therapy Treatment  Patient Details  Name: James Gallegos MRN: [id number] Date of Birth: [birth year] Referring Provider: Dr [name]  Encounter Date: 07/14/2016      PT End of Session - 07/14/16 1155    Visit Number 8   Number of Visits 16   Date for PT Re-Evaluation 08/13/16   Authorization Type [facility]    PT Start Time 1152  pt arrived late   PT Stop Time 1235   PT Time Calculation (min) 43 min   Activity Tolerance Patient tolerated treatment well   Behavior During Therapy [facility] for tasks assessed/performed      Past Medical History:  Diagnosis Date  . Arthritis   . Deep vein thrombosis ([place])   . Hypertension   . Murmur, cardiac   . Sleep apnea    CPAP    Past Surgical History:  Procedure Laterality Date  . CERVICAL SPINE SURGERY N/A 1990  . FINGER SURGERY Right 2008   x 3 index finger  . KNEE ARTHROSCOPY Bilateral    L 2004, R 2005  . 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 TOTAL KNEE ARTHROPLASTY Right 06/01/2016   Procedure: TOTAL KNEE ARTHROPLASTY;  Surgeon: [name], MD;  Location: [place];  Service: Orthopedics;  Laterality: Right;    There were no vitals filed for this visit.      Subjective Assessment - 07/14/16 1155    Subjective Pt reports low pain but feels very stiff.    Currently in Pain? Yes   Pain Score 3    Pain Location Knee   Pain Orientation Right   Pain Descriptors / Indicators --  stiff            [facility] PT Assessment - 07/14/16 0001      AROM   Right Knee Extension -5   Right Knee Flexion 99                     [facility] Adult PT Treatment/Exercise - 07/14/16 0001      Knee/Hip Exercises: Stretches   Other Knee/Hip Stretches low load, long duration knee extension suspended 2#     Knee/Hip Exercises: Aerobic   Nustep 5 min L5     Modalities   Modalities Moist Heat     Moist Heat Therapy   Number Minutes Moist Heat 10 Minutes  5 min concurrent with education   Moist Heat Location Knee  under knee in supine     Manual Therapy   Manual therapy comments soft tissue work in prone hang, gr 4 flexion mobs                PT Education - 07/14/16 1254    Education provided Yes   Education Details lack of extension and frequent stretching, HEP, progress through ROM, joint tightness, soreness/discomfort with stretching   Person(s) Educated Patient   Methods Explanation   Comprehension Verbalized understanding          PT Short Term Goals - 07/05/16 1326      PT SHORT TERM GOAL #1   Title Patient will increase right knee passive flexion by 25 degrees    Time 4   Period Weeks   Status On-going     PT SHORT TERM GOAL #2   Title Patient will demsotrate full passive right knee extension   Baseline  lacks 2 finger widths   Time 4   Period Weeks   Status On-going     PT SHORT TERM GOAL #3   Title Patient will increase gross right lower extremity strength to 4+/5    Time 4   Period Weeks   Status Unable to assess     PT SHORT TERM GOAL #4   Title Patient will walk 400' without an assistive device with minimal antalgic gait    Baseline intermittant cane   Time 4   Period Weeks   Status On-going     PT SHORT TERM GOAL #5   Title Patient wil be independent with inital HEP    Time 4   Period Weeks   Status Unable to assess           PT Long Term Goals - 06/21/16 1125      PT LONG TERM GOAL #1   Title Patient will increase gorss knee flexion movement to 0-120 degrees without pain in order to get up and down off a low chair.    Time 8   Period Weeks   Status New     PT LONG TERM GOAL #2   Title Patient will ambulate 3000' without increased pain and without an antalgic gait in order to return to work    Time 8   Period Weeks   Status New     PT LONG TERM GOAL #3   Title Patient will  be independent with final HEP for LE strength and stability    Time 8   Period Weeks   Status New     PT LONG TERM GOAL #4   Title Patient will demsotrate a 45% limitation on FOTO    Time 8   Period Weeks   Status New     PT LONG TERM GOAL #5   Title Patient will go up/down 8 steps without increased pain in order to perfrom ADL's    Time 8   Period Weeks   Status New               Plan - 07/14/16 1256    Clinical Impression Statement Pt verbalized some soreness after stretching today which I explained is to be expected. Disucssed the importance of gaining full knee extension. Held discussion regarding function and necessary knee flexion as pt is still deciding if he wants to have manipulation.    PT Next Visit Plan ; add long arc quad with red band and hamstring flexion. Continue with steps. Continue with mobilizations. Continue to focus on regaining range of motion. Add LAQ and Hamstring curl with t-BAND    PT Home Exercise Plan SAQ, LAQ, SLR , Quad set,    Consulted and Agree with Plan of Care Patient      Patient will benefit from skilled therapeutic intervention in order to improve the following deficits and impairments:     Visit Diagnosis: Stiffness of right knee, not elsewhere classified  Acute pain of right knee  Difficulty in walking, not elsewhere classified  Muscle weakness (generalized)     Problem List Patient Active Problem List   Diagnosis Date Noted  . Unilateral primary osteoarthritis, right knee 06/01/2016  . S/P total knee replacement using cement, right 06/01/2016  . Sleep apnea 05/19/2016  . Hypertension 05/19/2016  . Abdominal hernia 05/19/2016   Ayomide Purdy C. Kasandra Fehr PT, DPT 07/14/16 12:59 PM   [facility] [address] [place], [place], [address] Phone:  [phone]   Fax:  [phone]  Name: NIRVAN MUCCIO MRN: [id number] Date of Birth: Oct 29, 1950

## 2016-07-16 ENCOUNTER — Telehealth (INDEPENDENT_AMBULATORY_CARE_PROVIDER_SITE_OTHER): Payer: Self-pay

## 2016-07-16 NOTE — Telephone Encounter (Signed)
Sent in prior auth for pennsaid

## 2016-07-19 ENCOUNTER — Encounter: Payer: Self-pay | Admitting: Physical Therapy

## 2016-07-19 ENCOUNTER — Ambulatory Visit: Payer: 59 | Attending: Orthopaedic Surgery | Admitting: Physical Therapy

## 2016-07-19 DIAGNOSIS — M6281 Muscle weakness (generalized): Secondary | ICD-10-CM | POA: Diagnosis not present

## 2016-07-19 DIAGNOSIS — M25561 Pain in right knee: Secondary | ICD-10-CM | POA: Insufficient documentation

## 2016-07-19 DIAGNOSIS — M25661 Stiffness of right knee, not elsewhere classified: Secondary | ICD-10-CM | POA: Insufficient documentation

## 2016-07-19 DIAGNOSIS — R262 Difficulty in walking, not elsewhere classified: Secondary | ICD-10-CM | POA: Diagnosis not present

## 2016-07-19 MED FILL — TELMISARTAN-HCTZ 80-12.5 MG: 80-12.5 | 90 days supply | Qty: 90 | Fill #0

## 2016-07-19 NOTE — Therapy (Signed)
[place], [place], [address] Phone: [phone]   Fax:  [phone]  Physical Therapy Treatment  Patient Details  Name: James Gallegos MRN: [id number] Date of Birth: June 24, 1950 Referring Provider: Dr [name]  Encounter Date: 07/19/2016      PT End of Session - 07/19/16 1203    Visit Number 9   Number of Visits 16   Date for PT Re-Evaluation 08/13/16   Authorization Type [facility]    PT Start Time [id number]   PT Stop Time 1227   PT Time Calculation (min) 42 min   Activity Tolerance Patient tolerated treatment well   Behavior During Therapy [facility] for tasks assessed/performed      Past Medical History:  Diagnosis Date  . Arthritis   . Deep vein thrombosis ([place])   . Hypertension   . Murmur, cardiac   . Sleep apnea    CPAP    Past Surgical History:  Procedure Laterality Date  . CERVICAL SPINE SURGERY N/A 1990  . FINGER SURGERY Right 2008   x 3 index finger  . KNEE ARTHROSCOPY Bilateral    L 2004, R 2005  . 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 TOTAL KNEE ARTHROPLASTY Right 06/01/2016   Procedure: TOTAL KNEE ARTHROPLASTY;  Surgeon: [name], MD;  Location: [place];  Service: Orthopedics;  Laterality: Right;    There were no vitals filed for this visit.      Subjective Assessment - 07/19/16 1154    Subjective Patient continues to worry about the stiffness in his knee. His pain is very manageable. He is worried that his bend is not progressing enough.    Pertinent History DVT after past knee scope   Limitations Standing;Walking   How long can you sit comfortably? 1/2 hour befor the  knee stiffens    How long can you stand comfortably? < 10 minutes    How long can you walk comfortably? < 300' w/o pain    Diagnostic tests Nothing post op    Patient Stated Goals To improve ability to walk and to go back to work    Currently in Pain? Yes   Pain Score 3    Pain Location Knee   Pain Orientation Right   Pain Descriptors / Indicators Aching   Pain Type Surgical pain   Pain Onset More than a month ago   Pain Frequency Constant   Aggravating Factors  extra time walking    Pain Relieving Factors rest, ice    Effect of Pain on Daily Activities limited ability to ambulate            [facility] PT Assessment - 07/19/16 0001      AROM   Right Knee Extension -4   Right Knee Flexion 106                     [facility] Adult PT Treatment/Exercise - 07/19/16 0001      Knee/Hip Exercises: Stretches   Active Hamstring Stretch 3 reps;10 seconds     Knee/Hip Exercises: Aerobic   Nustep 5 min L5     Knee/Hip Exercises: Machines for Strengthening   Total Gym Leg Press 60 lb sled 8 2x10      Knee/Hip Exercises: Standing   Forward Step Up Limitations 8"x20   Step Down Limitations 4"2x10   Functional Squat Limitations x20    Other Standing Knee Exercises SLS 3x15 sec R  Knee/Hip Exercises: Seated   Long Arc Quad Limitations 2x10 green   Hamstring Limitations 2x10 green     Modalities   Modalities Moist Heat     Moist Heat Therapy   Number Minutes Moist Heat 5 Minutes   Moist Heat Location Knee  seated while recieving strap stretch     Manual Therapy   Manual therapy comments strap mobilization for flexion, , for edema,  patellar mobs , passive stretching                PT Education - 07/19/16 1202    Education provided Yes   Education Details continue with flexion and extension stretching    Person(s) Educated Patient   Methods Explanation   Comprehension Verbalized understanding;Returned demonstration          PT Short Term Goals - 07/05/16 1326      PT SHORT TERM GOAL #1   Title Patient will increase right knee passive flexion by 25 degrees    Time 4   Period Weeks   Status On-going     PT SHORT TERM GOAL #2   Title Patient will demsotrate full passive right knee extension   Baseline lacks 2 finger widths   Time 4    Period Weeks   Status On-going     PT SHORT TERM GOAL #3   Title Patient will increase gross right lower extremity strength to 4+/5    Time 4   Period Weeks   Status Unable to assess     PT SHORT TERM GOAL #4   Title Patient will walk 400' without an assistive device with minimal antalgic gait    Baseline intermittant cane   Time 4   Period Weeks   Status On-going     PT SHORT TERM GOAL #5   Title Patient wil be independent with inital HEP    Time 4   Period Weeks   Status Unable to assess           PT Long Term Goals - 06/21/16 1125      PT LONG TERM GOAL #1   Title Patient will increase gorss knee flexion movement to 0-120 degrees without pain in order to get up and down off a low chair.    Time 8   Period Weeks   Status New     PT LONG TERM GOAL #2   Title Patient will ambulate 3000' without increased pain and without an antalgic gait in order to return to work    Time 8   Period Weeks   Status New     PT LONG TERM GOAL #3   Title Patient will be independent with final HEP for LE strength and stability    Time 8   Period Weeks   Status New     PT LONG TERM GOAL #4   Title Patient will demsotrate a 45% limitation on FOTO    Time 8   Period Weeks   Status New     PT LONG TERM GOAL #5   Title Patient will go up/down 8 steps without increased pain in order to perfrom ADL's    Time 8   Period Weeks   Status New               Plan - 07/19/16 1542    Clinical Impression Statement Patient is making good progress. His motion was measured at 5-106. His strength also appears to be progressing well. He is still anxious  about his range but his range is progressing.    PT Frequency 2x / week   PT Duration 8 weeks   PT Treatment/Interventions ADLs/Self Care Home Management;Cryotherapy;Electrical Stimulation;Gait training;Stair training;Ultrasound;Moist Heat;Iontophoresis 4mg /ml Dexamethasone;Functional mobility training;Therapeutic activities;Therapeutic  exercise;Manual techniques;Patient/family education;Passive range of motion;Dry needling;Taping;Vasopneumatic Device;Neuromuscular re-education   PT Next Visit Plan ; add long arc quad with red band and hamstring flexion. Continue with steps. Continue with mobilizations. Continue to focus on regaining range of motion. Add LAQ and Hamstring curl with t-BAND    PT Home Exercise Plan SAQ, LAQ, SLR , Quad set,    Consulted and Agree with Plan of Care Patient      Patient will benefit from skilled therapeutic intervention in order to improve the following deficits and impairments:  Abnormal gait, Decreased range of motion, Difficulty walking, Increased muscle spasms, Decreased endurance, Decreased mobility, Decreased strength, Increased edema, Impaired sensation  Visit Diagnosis: Stiffness of right knee, not elsewhere classified  Acute pain of right knee  Difficulty in walking, not elsewhere classified  Muscle weakness (generalized)     Problem List Patient Active Problem List   Diagnosis Date Noted  . Unilateral primary osteoarthritis, right knee 06/01/2016  . S/P total knee replacement using cement, right 06/01/2016  . Sleep apnea 05/19/2016  . Hypertension 05/19/2016  . Abdominal hernia 05/19/2016    Carney Living PT DPT  07/19/2016, 4:05 PM  [facility] [address] [place], [place], [address] Phone: [phone]   Fax:  [phone]  Name: James Gallegos MRN: [id number] Date of Birth: October 06, 1950

## 2016-07-21 ENCOUNTER — Encounter: Payer: Self-pay | Admitting: Physical Therapy

## 2016-07-21 ENCOUNTER — Ambulatory Visit: Payer: 59 | Admitting: Physical Therapy

## 2016-07-21 DIAGNOSIS — M6281 Muscle weakness (generalized): Secondary | ICD-10-CM

## 2016-07-21 DIAGNOSIS — R262 Difficulty in walking, not elsewhere classified: Secondary | ICD-10-CM

## 2016-07-21 DIAGNOSIS — M25561 Pain in right knee: Secondary | ICD-10-CM

## 2016-07-21 DIAGNOSIS — M25661 Stiffness of right knee, not elsewhere classified: Secondary | ICD-10-CM | POA: Diagnosis not present

## 2016-07-21 NOTE — Therapy (Signed)
[place], [place], [address] Phone: [phone]   Fax:  [phone]  Physical Therapy Treatment  Patient Details  Name: James Gallegos MRN: [id number] Date of Birth: 1950-12-21 Referring Provider: Dr [name]  Encounter Date: 07/21/2016      PT End of Session - 07/21/16 1137    Visit Number 10   Number of Visits 16   Date for PT Re-Evaluation 08/13/16   Authorization Type [facility]    PT Start Time 0930   PT Stop Time 1012   PT Time Calculation (min) 42 min   Activity Tolerance Patient tolerated treatment well   Behavior During Therapy [facility] for tasks assessed/performed      Past Medical History:  Diagnosis Date  . Arthritis   . Deep vein thrombosis ([place])   . Hypertension   . Murmur, cardiac   . Sleep apnea    CPAP    Past Surgical History:  Procedure Laterality Date  . CERVICAL SPINE SURGERY N/A 1990  . FINGER SURGERY Right 2008   x 3 index finger  . KNEE ARTHROSCOPY Bilateral    L 2004, R 2005  . 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 TOTAL KNEE ARTHROPLASTY Right 06/01/2016   Procedure: TOTAL KNEE ARTHROPLASTY;  Surgeon: [name], MD;  Location: [place];  Service: Orthopedics;  Laterality: Right;    There were no vitals filed for this visit.      Subjective Assessment - 07/21/16 0956    Subjective Patient reports his knee has been stiff but overall the pain has been good. He is having some pain in his medial hamstring.    Pertinent History DVT after past knee scope   Limitations Standing;Walking   How long can you sit comfortably? 1/2 hour befor the  knee stiffens    How long can you stand comfortably? < 10 minutes    How long can you walk comfortably? < 300' w/o pain    Diagnostic tests Nothing post op    Patient Stated Goals To improve ability to walk and to go back to work    Currently in Pain? Yes   Pain Score 2    Pain Location Knee   Pain Orientation  Right   Pain Descriptors / Indicators Aching   Pain Type Surgical pain   Pain Onset More than a month ago   Pain Frequency Constant   Aggravating Factors  extra time walking    Pain Relieving Factors rest, ice    Effect of Pain on Daily Activities limited ability to ambulate.    Multiple Pain Sites No                                   PT Short Term Goals - 07/21/16 1139      PT SHORT TERM GOAL #1   Title Patient will increase right knee passive flexion by 25 degrees    Baseline 108   Time 4   Period Weeks   Status Achieved     PT SHORT TERM GOAL #2   Title Patient will demsotrate full passive right knee extension   Baseline lacks 2 finger widths   Time 4   Period Weeks   Status On-going     PT SHORT TERM GOAL #3   Title Patient will increase gross right lower extremity strength to 4+/5    Baseline continues to  work on strengthening    Time 4   Period Weeks   Status On-going     PT SHORT TERM GOAL #4   Title Patient will walk 400' without an assistive device with minimal antalgic gait    Baseline no longer using cane    Time 4   Period Weeks   Status On-going     PT SHORT TERM GOAL #5   Title Patient wil be independent with inital HEP    Time 4   Period Weeks   Status On-going           PT Long Term Goals - 06/21/16 1125      PT LONG TERM GOAL #1   Title Patient will increase gorss knee flexion movement to 0-120 degrees without pain in order to get up and down off a low chair.    Time 8   Period Weeks   Status New     PT LONG TERM GOAL #2   Title Patient will ambulate 3000' without increased pain and without an antalgic gait in order to return to work    Time 8   Period Weeks   Status New     PT LONG TERM GOAL #3   Title Patient will be independent with final HEP for LE strength and stability    Time 8   Period Weeks   Status New     PT LONG TERM GOAL #4   Title Patient will demsotrate a 45% limitation on FOTO     Time 8   Period Weeks   Status New     PT LONG TERM GOAL #5   Title Patient will go up/down 8 steps without increased pain in order to perfrom ADL's    Time 8   Period Weeks   Status New               Plan - 07/21/16 1138    Clinical Impression Statement Patient tolerated treamtnet well. No significant increase in pain. Hamstring curls not perfromed 2nd to hamstring soreness. Range continues to progress.    Rehab Potential Good   PT Frequency 2x / week   PT Duration 8 weeks   PT Treatment/Interventions ADLs/Self Care Home Management;Cryotherapy;Electrical Stimulation;Gait training;Stair training;Ultrasound;Moist Heat;Iontophoresis 4mg /ml Dexamethasone;Functional mobility training;Therapeutic activities;Therapeutic exercise;Manual techniques;Patient/family education;Passive range of motion;Dry needling;Taping;Vasopneumatic Device;Neuromuscular re-education   PT Next Visit Plan FOTO    PT Home Exercise Plan SAQ, LAQ, SLR , Quad set,    Consulted and Agree with Plan of Care Patient      Patient will benefit from skilled therapeutic intervention in order to improve the following deficits and impairments:  Abnormal gait, Decreased range of motion, Difficulty walking, Increased muscle spasms, Decreased endurance, Decreased mobility, Decreased strength, Increased edema, Impaired sensation  Visit Diagnosis: Stiffness of right knee, not elsewhere classified  Acute pain of right knee  Difficulty in walking, not elsewhere classified  Muscle weakness (generalized)     Problem List Patient Active Problem List   Diagnosis Date Noted  . Unilateral primary osteoarthritis, right knee 06/01/2016  . S/P total knee replacement using cement, right 06/01/2016  . Sleep apnea 05/19/2016  . Hypertension 05/19/2016  . Abdominal hernia 05/19/2016    Carney Living  PT DPT  07/21/2016, 11:42 AM  [facility] [address] [place], [place], [address] Phone: [phone]   Fax:  [phone]  Name: James Gallegos MRN: [id number] Date of Birth: Feb 09, 1951

## 2016-07-26 ENCOUNTER — Encounter: Payer: Self-pay | Admitting: Physical Therapy

## 2016-07-26 ENCOUNTER — Ambulatory Visit: Payer: 59 | Admitting: Physical Therapy

## 2016-07-26 DIAGNOSIS — M25561 Pain in right knee: Secondary | ICD-10-CM

## 2016-07-26 DIAGNOSIS — R262 Difficulty in walking, not elsewhere classified: Secondary | ICD-10-CM | POA: Diagnosis not present

## 2016-07-26 DIAGNOSIS — M25661 Stiffness of right knee, not elsewhere classified: Secondary | ICD-10-CM

## 2016-07-26 DIAGNOSIS — M6281 Muscle weakness (generalized): Secondary | ICD-10-CM | POA: Diagnosis not present

## 2016-07-26 NOTE — Therapy (Signed)
[place], [place], [address] Phone: [phone]   Fax:  [phone]  Physical Therapy Treatment  Patient Details  Name: James Gallegos MRN: [id number] Date of Birth: 1951-02-17 Referring Provider: Dr [name]  Encounter Date: 07/26/2016      PT End of Session - 07/26/16 0921    Visit Number 11   Number of Visits 16   Date for PT Re-Evaluation 08/13/16   Authorization Type [facility]    PT Start Time 0840   PT Stop Time 0926   PT Time Calculation (min) 46 min   Activity Tolerance Patient tolerated treatment well   Behavior During Therapy [facility] for tasks assessed/performed      Past Medical History:  Diagnosis Date  . Arthritis   . Deep vein thrombosis ([place])   . Hypertension   . Murmur, cardiac   . Sleep apnea    CPAP    Past Surgical History:  Procedure Laterality Date  . CERVICAL SPINE SURGERY N/A 1990  . FINGER SURGERY Right 2008   x 3 index finger  . KNEE ARTHROSCOPY Bilateral    L 2004, R 2005  . 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 TOTAL KNEE ARTHROPLASTY Right 06/01/2016   Procedure: TOTAL KNEE ARTHROPLASTY;  Surgeon: [name], MD;  Location: [place];  Service: Orthopedics;  Laterality: Right;    There were no vitals filed for this visit.      Subjective Assessment - 07/26/16 0847    Subjective Patient was sore on Saturday. He is still having some sensetive areas around the scar. He feels like he has been stiff but it has been rainy the past few days.    Pertinent History DVT after past knee scope   Limitations Standing;Walking   How long can you sit comfortably? 1/2 hour befor the  knee stiffens    How long can you stand comfortably? < 10 minutes    How long can you walk comfortably? < 300' w/o pain    Diagnostic tests Nothing post op    Patient Stated Goals To improve ability to walk and to go back to work    Currently in Pain? No/denies  " Just Stiff    Pain Location Knee   Pain Orientation Right   Pain Descriptors / Indicators Aching   Pain Type Surgical pain   Pain Onset More than a month ago   Pain Frequency Constant   Aggravating Factors  extra time walking    Pain Relieving Factors rest, ice    Effect of Pain on Daily Activities continued stiffness in the morning                          [facility] Adult PT Treatment/Exercise - 07/26/16 0001      Knee/Hip Exercises: Stretches   Active Hamstring Stretch 3 reps;10 seconds     Knee/Hip Exercises: Aerobic   Nustep 5 min L5     Knee/Hip Exercises: Machines for Strengthening   Total Gym Leg Press 80 sled 8 3x10      Knee/Hip Exercises: Standing   Lateral Step Up Limitations 8"x20    Forward Step Up Limitations 8"x20   Step Down Limitations 6"2x10   Functional Squat Limitations x20    Other Standing Knee Exercises SLS 3x12 sec R    Other Standing Knee Exercises step onto air-ex.     Modalities   Modalities Moist Heat  Moist Heat Therapy   Number Minutes Moist Heat 8 Minutes  with strap stretch    Moist Heat Location Knee     Manual Therapy   Manual therapy comments strap mobilization for flexion, , for edema,  patellar mobs , passive stretching; extension stretching.                 PT Education - 07/26/16 [phone]    Education provided Yes   Education Details continue to work on stretching and Recruitment consultant) Educated Patient   Methods Explanation   Comprehension Verbalized understanding;Returned demonstration          PT Short Term Goals - 07/26/16 1716      PT SHORT TERM GOAL #1   Title Patient will increase right knee passive flexion by 25 degrees    Baseline 105   Time 4   Period Weeks   Status Achieved     PT SHORT TERM GOAL #2   Title Patient will demsotrate full passive right knee extension   Baseline lacks 2 finger widths   Time 4   Period Weeks   Status On-going     PT SHORT TERM GOAL #3   Title Patient will  increase gross right lower extremity strength to 4+/5    Baseline continues to work on strengthening    Time 4   Period Weeks   Status On-going     PT SHORT TERM GOAL #4   Title Patient will walk 400' without an assistive device with minimal antalgic gait    Baseline no longer using cane    Time 4   Period Weeks   Status On-going     PT SHORT TERM GOAL #5   Title Patient wil be independent with inital HEP    Time 4   Period Weeks   Status On-going           PT Long Term Goals - 06/21/16 1125      PT LONG TERM GOAL #1   Title Patient will increase gorss knee flexion movement to 0-120 degrees without pain in order to get up and down off a low chair.    Time 8   Period Weeks   Status New     PT LONG TERM GOAL #2   Title Patient will ambulate 3000' without increased pain and without an antalgic gait in order to return to work    Time 8   Period Weeks   Status New     PT LONG TERM GOAL #3   Title Patient will be independent with final HEP for LE strength and stability    Time 8   Period Weeks   Status New     PT LONG TERM GOAL #4   Title Patient will demsotrate a 45% limitation on FOTO    Time 8   Period Weeks   Status New     PT LONG TERM GOAL #5   Title Patient will go up/down 8 steps without increased pain in order to perfrom ADL's    Time [place] - 07/26/16 [id number]    Clinical Impression Statement Patient continues to make good progress. His knee cap was tight this morning. Therapy focused on mobilization of the knee cap. His trange ewas measured at 3-105 degrees.  He has nmet his goal for FOTO.  Rehab Potential Good   PT Frequency 2x / week   PT Duration 8 weeks   PT Treatment/Interventions ADLs/Self Care Home Management;Cryotherapy;Electrical Stimulation;Gait training;Stair training;Ultrasound;Moist Heat;Iontophoresis 4mg /ml Dexamethasone;Functional mobility training;Therapeutic activities;Therapeutic  exercise;Manual techniques;Patient/family education;Passive range of motion;Dry needling;Taping;Vasopneumatic Device;Neuromuscular re-education   PT Home Exercise Plan SAQ, LAQ, SLR , Quad set,    Consulted and Agree with Plan of Care Patient      Patient will benefit from skilled therapeutic intervention in order to improve the following deficits and impairments:  Abnormal gait, Decreased range of motion, Difficulty walking, Increased muscle spasms, Decreased endurance, Decreased mobility, Decreased strength, Increased edema, Impaired sensation  Visit Diagnosis: Stiffness of right knee, not elsewhere classified  Acute pain of right knee  Difficulty in walking, not elsewhere classified  Muscle weakness (generalized)     Problem List Patient Active Problem List   Diagnosis Date Noted  . Unilateral primary osteoarthritis, right knee 06/01/2016  . S/P total knee replacement using cement, right 06/01/2016  . Sleep apnea 05/19/2016  . Hypertension 05/19/2016  . Abdominal hernia 05/19/2016    Carney Living  PT DPT  07/26/2016, 5:18 PM  [facility] [address] [place], [place], [address] Phone: [phone]   Fax:  [phone]  Name: James Gallegos MRN: [id number] Date of Birth: 02-20-1951

## 2016-07-28 ENCOUNTER — Ambulatory Visit: Payer: 59 | Admitting: Physical Therapy

## 2016-07-28 ENCOUNTER — Encounter: Payer: Self-pay | Admitting: Physical Therapy

## 2016-07-28 DIAGNOSIS — M25661 Stiffness of right knee, not elsewhere classified: Secondary | ICD-10-CM

## 2016-07-28 DIAGNOSIS — M6281 Muscle weakness (generalized): Secondary | ICD-10-CM

## 2016-07-28 DIAGNOSIS — M25561 Pain in right knee: Secondary | ICD-10-CM

## 2016-07-28 DIAGNOSIS — R262 Difficulty in walking, not elsewhere classified: Secondary | ICD-10-CM

## 2016-07-28 NOTE — Therapy (Signed)
[place], [place], [address] Phone: [phone]   Fax:  [phone]  Physical Therapy Treatment  Patient Details  Name: James Gallegos MRN: [id number] Date of Birth: 30-Jul-1950 Referring Provider: Dr [name]  Encounter Date: 07/28/2016      PT End of Session - 07/28/16 1415    Visit Number 12   Number of Visits 16   Date for PT Re-Evaluation 08/13/16   Authorization Type [facility]    PT Start Time 1147   PT Stop Time 1230   PT Time Calculation (min) 43 min   Activity Tolerance Patient tolerated treatment well   Behavior During Therapy [facility] for tasks assessed/performed      Past Medical History:  Diagnosis Date  . Arthritis   . Deep vein thrombosis ([place])   . Hypertension   . Murmur, cardiac   . Sleep apnea    CPAP    Past Surgical History:  Procedure Laterality Date  . CERVICAL SPINE SURGERY N/A 1990  . FINGER SURGERY Right 2008   x 3 index finger  . KNEE ARTHROSCOPY Bilateral    L 2004, R 2005  . 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 TOTAL KNEE ARTHROPLASTY Right 06/01/2016   Procedure: TOTAL KNEE ARTHROPLASTY;  Surgeon: [name], MD;  Location: [place];  Service: Orthopedics;  Laterality: Right;    There were no vitals filed for this visit.      Subjective Assessment - 07/28/16 1410    Subjective Patients ROM was measured at 4-110 degrees. He has been having some pain in his foot. Therapy gave him some options as far as stretching that dont put pressure on the foot. He was also advised again not to do heel raises.    Pertinent History DVT after past knee scope   Limitations Standing;Walking   How long can you sit comfortably? 1/2 hour befor the  knee stiffens    How long can you stand comfortably? < 10 minutes    How long can you walk comfortably? < 300' w/o pain    Diagnostic tests Nothing post op    Patient Stated Goals To improve ability to walk and to go  back to work    Currently in Pain? Yes   Pain Score 2    Pain Location Knee   Pain Orientation Right   Pain Descriptors / Indicators Aching   Pain Type Surgical pain   Pain Onset More than a month ago   Pain Frequency Constant   Aggravating Factors  extrat time walking    Pain Relieving Factors rest, ice    Effect of Pain on Daily Activities continued stiffness                                  PT Education - 07/28/16 1415    Education Details wall stretch and seated stretch to take pressure off his foot    Person(s) Educated Patient   Methods Explanation   Comprehension Verbalized understanding;Returned demonstration          PT Short Term Goals - 07/28/16 1416      PT SHORT TERM GOAL #1   Title Patient will increase right knee passive flexion by 25 degrees    Baseline 10   Time 4   Period Weeks   Status Achieved     PT SHORT TERM GOAL #2   Title  Patient will demsotrate full passive right knee extension   Baseline continues to lack end range    Time 4   Period Weeks   Status Achieved     PT SHORT TERM GOAL #3   Title Patient will increase gross right lower extremity strength to 4+/5    Baseline continues to work on strengthening    Time 4   Period Weeks   Status On-going     PT SHORT TERM GOAL #4   Title Patient will walk 400' without an assistive device with minimal antalgic gait    Baseline no longer using cane    Time 4   Period Weeks   Status Achieved     PT SHORT TERM GOAL #5   Title Patient wil be independent with inital HEP    Time 4   Period Weeks   Status On-going           PT Long Term Goals - 06/21/16 1125      PT LONG TERM GOAL #1   Title Patient will increase gorss knee flexion movement to 0-120 degrees without pain in order to get up and down off a low chair.    Time 8   Period Weeks   Status New     PT LONG TERM GOAL #2   Title Patient will ambulate 3000' without increased pain and without an antalgic  gait in order to return to work    Time 8   Period Weeks   Status New     PT LONG TERM GOAL #3   Title Patient will be independent with final HEP for LE strength and stability    Time 8   Period Weeks   Status New     PT LONG TERM GOAL #4   Title Patient will demsotrate a 45% limitation on FOTO    Time 8   Period Weeks   Status New     PT LONG TERM GOAL #5   Title Patient will go up/down 8 steps without increased pain in order to perfrom ADL's    Time 8   Period Weeks   Status New               Plan - 07/28/16 1416    Clinical Impression Statement Patients range continues to progress. He was able to do a 6 inch step down. He is having more pain in his foot at this time then his knee.    PT Frequency 2x / week   PT Duration 8 weeks   PT Treatment/Interventions ADLs/Self Care Home Management;Cryotherapy;Electrical Stimulation;Gait training;Stair training;Ultrasound;Moist Heat;Iontophoresis 4mg /ml Dexamethasone;Functional mobility training;Therapeutic activities;Therapeutic exercise;Manual techniques;Patient/family education;Passive range of motion;Dry needling;Taping;Vasopneumatic Device;Neuromuscular re-education   PT Next Visit Plan FOTO    PT Home Exercise Plan SAQ, LAQ, SLR , Quad set,    Consulted and Agree with Plan of Care Patient      Patient will benefit from skilled therapeutic intervention in order to improve the following deficits and impairments:     Visit Diagnosis: Stiffness of right knee, not elsewhere classified  Acute pain of right knee  Difficulty in walking, not elsewhere classified  Muscle weakness (generalized)     Problem List Patient Active Problem List   Diagnosis Date Noted  . Unilateral primary osteoarthritis, right knee 06/01/2016  . S/P total knee replacement using cement, right 06/01/2016  . Sleep apnea 05/19/2016  . Hypertension 05/19/2016  . Abdominal hernia 05/19/2016    Carney Living PT DPT  07/28/2016, 2:18  PM  [place], [place], [address] Phone: [phone]   Fax:  [phone]  Name: James Gallegos MRN: [id number] Date of Birth: 1950/09/02

## 2016-08-02 ENCOUNTER — Ambulatory Visit: Payer: 59 | Admitting: Physical Therapy

## 2016-08-02 ENCOUNTER — Encounter: Payer: Self-pay | Admitting: Physical Therapy

## 2016-08-02 DIAGNOSIS — M25661 Stiffness of right knee, not elsewhere classified: Secondary | ICD-10-CM | POA: Diagnosis not present

## 2016-08-02 DIAGNOSIS — M25561 Pain in right knee: Secondary | ICD-10-CM | POA: Diagnosis not present

## 2016-08-02 DIAGNOSIS — R262 Difficulty in walking, not elsewhere classified: Secondary | ICD-10-CM

## 2016-08-02 DIAGNOSIS — M6281 Muscle weakness (generalized): Secondary | ICD-10-CM | POA: Diagnosis not present

## 2016-08-02 MED FILL — EPLERENONE 25 MG TABLET: 25 | 30 days supply | Qty: 30 | Fill #1

## 2016-08-02 NOTE — Therapy (Signed)
[place], [place], [address] Phone: [phone]   Fax:  [phone]  Physical Therapy Treatment  Patient Details  Name: James Gallegos MRN: [id number] Date of Birth: Dec 29, 1950 Referring Provider: Dr [name]  Encounter Date: 08/02/2016      PT End of Session - 08/02/16 1048    Visit Number 13   Number of Visits 16   Date for PT Re-Evaluation 08/13/16   Authorization Type [facility]    PT Start Time [id number]   PT Stop Time 1056   PT Time Calculation (min) 41 min   Activity Tolerance Patient tolerated treatment well   Behavior During Therapy [facility] for tasks assessed/performed      Past Medical History:  Diagnosis Date  . Arthritis   . Deep vein thrombosis ([place])   . Hypertension   . Murmur, cardiac   . Sleep apnea    CPAP    Past Surgical History:  Procedure Laterality Date  . CERVICAL SPINE SURGERY N/A 1990  . FINGER SURGERY Right 2008   x 3 index finger  . KNEE ARTHROSCOPY Bilateral    L 2004, R 2005  . 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 TOTAL KNEE ARTHROPLASTY Right 06/01/2016   Procedure: TOTAL KNEE ARTHROPLASTY;  Surgeon: [name], MD;  Location: [place];  Service: Orthopedics;  Laterality: Right;    There were no vitals filed for this visit.      Subjective Assessment - 08/02/16 1020    Subjective Patient was sore on Friday but over the weekend he had 2 really good days. Thsi morning his knee is back to feeling stiff again.    Pertinent History DVT after past knee scope   Limitations Standing;Walking   How long can you sit comfortably? 1/2 hour befor the  knee stiffens    How long can you stand comfortably? < 10 minutes    How long can you walk comfortably? < 300' w/o pain    Diagnostic tests Nothing post op    Patient Stated Goals To improve ability to walk and to go back to work    Currently in Pain? Yes   Pain Score 2    Pain Location Knee   Pain  Orientation Right   Pain Descriptors / Indicators Aching   Pain Type Surgical pain   Pain Onset More than a month ago   Pain Frequency Constant   Aggravating Factors  extra time walking    Pain Relieving Factors rest, ice    Effect of Pain on Daily Activities continued stiffness    Multiple Pain Sites No                         [facility] Adult PT Treatment/Exercise - 08/02/16 0001      Knee/Hip Exercises: Stretches   Active Hamstring Stretch 3 reps;10 seconds     Knee/Hip Exercises: Aerobic   Nustep 5 min L5     Knee/Hip Exercises: Machines for Strengthening   Total Gym Leg Press 80 sled 8 3x10      Knee/Hip Exercises: Standing   Lateral Step Up Limitations 8"x20    Forward Step Up Limitations 8"x20   Step Down Limitations 6"2x10   Functional Squat Limitations x20    Other Standing Knee Exercises SLS 3x12 sec R    Other Standing Knee Exercises step onto air-ex.     Modalities   Modalities Moist Heat  Moist Heat Therapy   Number Minutes Moist Heat 6 Minutes   Moist Heat Location Knee     Manual Therapy   Manual therapy comments strap mobilization for flexion, , for edema,  patellar mobs , passive stretching; extension stretching.                 PT Education - 08/02/16 1045    Education provided Yes   Education Details continue with stretching and strengthening    Person(s) Educated Patient   Methods Explanation   Comprehension Verbalized understanding;Returned demonstration          PT Short Term Goals - 07/28/16 1416      PT SHORT TERM GOAL #1   Title Patient will increase right knee passive flexion by 25 degrees    Baseline 10   Time 4   Period Weeks   Status Achieved     PT SHORT TERM GOAL #2   Title Patient will demsotrate full passive right knee extension   Baseline continues to lack end range    Time 4   Period Weeks   Status Achieved     PT SHORT TERM GOAL #3   Title Patient will increase gross right lower extremity  strength to 4+/5    Baseline continues to work on strengthening    Time 4   Period Weeks   Status On-going     PT SHORT TERM GOAL #4   Title Patient will walk 400' without an assistive device with minimal antalgic gait    Baseline no longer using cane    Time 4   Period Weeks   Status Achieved     PT SHORT TERM GOAL #5   Title Patient wil be independent with inital HEP    Time 4   Period Weeks   Status On-going           PT Long Term Goals - 06/21/16 1125      PT LONG TERM GOAL #1   Title Patient will increase gorss knee flexion movement to 0-120 degrees without pain in order to get up and down off a low chair.    Time 8   Period Weeks   Status New     PT LONG TERM GOAL #2   Title Patient will ambulate 3000' without increased pain and without an antalgic gait in order to return to work    Time 8   Period Weeks   Status New     PT LONG TERM GOAL #3   Title Patient will be independent with final HEP for LE strength and stability    Time 8   Period Weeks   Status New     PT LONG TERM GOAL #4   Title Patient will demsotrate a 45% limitation on FOTO    Time 8   Period Weeks   Status New     PT LONG TERM GOAL #5   Title Patient will go up/down 8 steps without increased pain in order to perfrom ADL's    Time 8   Period Weeks   Status New               Plan - 08/02/16 1052    Clinical Impression Statement Patient continues to make great progress. His range as measured at 08-1110 degrees after strap mobilization.    Rehab Potential Good   PT Frequency 2x / week   PT Duration 8 weeks   PT Treatment/Interventions ADLs/Self Care Home Management;Cryotherapy;Electrical Stimulation;Gait  training;Stair training;Ultrasound;Moist Heat;Iontophoresis 4mg /ml Dexamethasone;Functional mobility training;Therapeutic activities;Therapeutic exercise;Manual techniques;Patient/family education;Passive range of motion;Dry needling;Taping;Vasopneumatic Device;Neuromuscular  re-education   PT Next Visit Plan continue with stretching and strengthening    PT Home Exercise Plan SAQ, LAQ, SLR , Quad set,    Consulted and Agree with Plan of Care Patient      Patient will benefit from skilled therapeutic intervention in order to improve the following deficits and impairments:  Abnormal gait, Decreased range of motion, Difficulty walking, Increased muscle spasms, Decreased endurance, Decreased mobility, Decreased strength, Increased edema, Impaired sensation  Visit Diagnosis: Stiffness of right knee, not elsewhere classified  Acute pain of right knee  Difficulty in walking, not elsewhere classified  Muscle weakness (generalized)     Problem List Patient Active Problem List   Diagnosis Date Noted  . Unilateral primary osteoarthritis, right knee 06/01/2016  . S/P total knee replacement using cement, right 06/01/2016  . Sleep apnea 05/19/2016  . Hypertension 05/19/2016  . Abdominal hernia 05/19/2016    Carney Living PT DPT  08/02/2016, 1:27 PM  [facility] [address] [place], [place], [address] Phone: [phone]   Fax:  [phone]  Name: James Gallegos MRN: [id number] Date of Birth: 1950-10-05

## 2016-08-04 MED FILL — AMOXICILLIN 500 MG CAPSULE: 500 | 3 days supply | Qty: 12 | Fill #0

## 2016-08-05 ENCOUNTER — Ambulatory Visit: Payer: 59 | Admitting: Physical Therapy

## 2016-08-05 DIAGNOSIS — M6281 Muscle weakness (generalized): Secondary | ICD-10-CM

## 2016-08-05 DIAGNOSIS — M25561 Pain in right knee: Secondary | ICD-10-CM

## 2016-08-05 DIAGNOSIS — R262 Difficulty in walking, not elsewhere classified: Secondary | ICD-10-CM | POA: Diagnosis not present

## 2016-08-05 DIAGNOSIS — M25661 Stiffness of right knee, not elsewhere classified: Secondary | ICD-10-CM | POA: Diagnosis not present

## 2016-08-05 NOTE — Therapy (Signed)
[place], [place], [address] Phone: [phone]   Fax:  [phone]  Physical Therapy Treatment  Patient Details  Name: James Gallegos MRN: [id number] Date of Birth: 1950/08/02 Referring Provider: Dr [name]  Encounter Date: 08/05/2016      PT End of Session - 08/05/16 1003    Visit Number 14   Number of Visits 16   Date for PT Re-Evaluation 08/13/16   Authorization Type [facility]    PT Start Time 0931   PT Stop Time 1015   PT Time Calculation (min) 44 min   Activity Tolerance Patient tolerated treatment well   Behavior During Therapy [facility] for tasks assessed/performed      Past Medical History:  Diagnosis Date  . Arthritis   . Deep vein thrombosis ([place])   . Hypertension   . Murmur, cardiac   . Sleep apnea    CPAP    Past Surgical History:  Procedure Laterality Date  . CERVICAL SPINE SURGERY N/A 1990  . FINGER SURGERY Right 2008   x 3 index finger  . KNEE ARTHROSCOPY Bilateral    L 2004, R 2005  . 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 TOTAL KNEE ARTHROPLASTY Right 06/01/2016   Procedure: TOTAL KNEE ARTHROPLASTY;  Surgeon: [name], MD;  Location: [place];  Service: Orthopedics;  Laterality: Right;    There were no vitals filed for this visit.      Subjective Assessment - 08/05/16 1000    Subjective Patient reports he did the bie yesterday and felt butning after. He is sore in his hamstrings today. He feels like his knee is looser.    Pertinent History DVT after past knee scope   Limitations Standing;Walking   How long can you sit comfortably? 1/2 hour befor the  knee stiffens    How long can you stand comfortably? < 10 minutes    How long can you walk comfortably? < 300' w/o pain    Diagnostic tests Nothing post op    Patient Stated Goals To improve ability to walk and to go back to work    Currently in Pain? Yes   Pain Score 3    Pain Location Knee   Pain  Orientation Right   Pain Descriptors / Indicators Aching   Pain Type Surgical pain   Pain Onset More than a month ago   Pain Frequency Constant   Aggravating Factors  extra time walking    Pain Relieving Factors rest, ice    Effect of Pain on Daily Activities continued stiffness                         [facility] Adult PT Treatment/Exercise - 08/05/16 0001      Knee/Hip Exercises: Stretches   Active Hamstring Stretch 3 reps;10 seconds     Knee/Hip Exercises: Aerobic   Nustep 5 min L5     Knee/Hip Exercises: Machines for Strengthening   Total Gym Leg Press 80 sled 8 3x10      Knee/Hip Exercises: Standing   Lateral Step Up Limitations 8"x20    Forward Step Up Limitations 8"x20   Step Down Limitations 6"2x10   Functional Squat Limitations x20    Other Standing Knee Exercises SLS 3x12 sec R    Other Standing Knee Exercises step onto air-ex.     Modalities   Modalities Moist Heat     Moist Heat Therapy   Moist  Heat Location Knee     Manual Therapy   Manual therapy comments PROM into flexion and extension grade II and II PA and AP mobbs 4 way patellar mobilizations.                 PT Education - 08/05/16 1003    Education provided Yes   Education Details continue with stretching and strengthening    Person(s) Educated Patient   Methods Explanation   Comprehension Verbalized understanding;Returned demonstration          PT Short Term Goals - 08/05/16 1005      PT SHORT TERM GOAL #1   Title Patient will increase right knee passive flexion by 25 degrees    Baseline 10   Time 4   Period Weeks   Status Achieved     PT SHORT TERM GOAL #2   Title Patient will demsotrate full passive right knee extension   Baseline continues to lack end range    Time 4   Period Weeks   Status On-going     PT SHORT TERM GOAL #3   Title Patient will increase gross right lower extremity strength to 4+/5    Baseline continues to work on strengthening    Time 4    Period Weeks   Status On-going     PT SHORT TERM GOAL #4   Title Patient will walk 400' without an assistive device with minimal antalgic gait    Baseline no longer using cane    Time 4   Period Weeks   Status Achieved     PT SHORT TERM GOAL #5   Title Patient wil be independent with inital HEP    Time 4   Period Weeks   Status Achieved           PT Long Term Goals - 06/21/16 1125      PT LONG TERM GOAL #1   Title Patient will increase gorss knee flexion movement to 0-120 degrees without pain in order to get up and down off a low chair.    Time 8   Period Weeks   Status New     PT LONG TERM GOAL #2   Title Patient will ambulate 3000' without increased pain and without an antalgic gait in order to return to work    Time 8   Period Weeks   Status New     PT LONG TERM GOAL #3   Title Patient will be independent with final HEP for LE strength and stability    Time 8   Period Weeks   Status New     PT LONG TERM GOAL #4   Title Patient will demsotrate a 45% limitation on FOTO    Time 8   Period Weeks   Status New     PT LONG TERM GOAL #5   Title Patient will go up/down 8 steps without increased pain in order to perfrom ADL's    Time 8   Period Weeks   Status New               Plan - 08/05/16 1004    Clinical Impression Statement PROM measured at 3-113 today. He was sore 2nd to using the bike yesterday but he continues to tolerate treatment well. He is progressing well towards all goals.    PT Frequency 2x / week   PT Duration 8 weeks   PT Treatment/Interventions ADLs/Self Care Home Management;Cryotherapy;Electrical Stimulation;Gait training;Stair training;Ultrasound;Moist Heat;Iontophoresis 4mg /ml Dexamethasone;Functional  mobility training;Therapeutic activities;Therapeutic exercise;Manual techniques;Patient/family education;Passive range of motion;Dry needling;Taping;Vasopneumatic Device;Neuromuscular re-education   PT Next Visit Plan continue with  stretching and strengthening    PT Home Exercise Plan SAQ, LAQ, SLR , Quad set,    Consulted and Agree with Plan of Care Patient      Patient will benefit from skilled therapeutic intervention in order to improve the following deficits and impairments:  Abnormal gait, Decreased range of motion, Difficulty walking, Increased muscle spasms, Decreased endurance, Decreased mobility, Decreased strength, Increased edema, Impaired sensation  Visit Diagnosis: Stiffness of right knee, not elsewhere classified  Acute pain of right knee  Difficulty in walking, not elsewhere classified  Muscle weakness (generalized)     Problem List Patient Active Problem List   Diagnosis Date Noted  . Unilateral primary osteoarthritis, right knee 06/01/2016  . S/P total knee replacement using cement, right 06/01/2016  . Sleep apnea 05/19/2016  . Hypertension 05/19/2016  . Abdominal hernia 05/19/2016    Carney Living 08/05/2016, 4:41 PM  [facility] [address] [place], [place], [address] Phone: [phone]   Fax:  [phone]  Name: James Gallegos MRN: [id number] Date of Birth: [birth year]

## 2016-08-09 ENCOUNTER — Encounter: Payer: Self-pay | Admitting: Physical Therapy

## 2016-08-09 ENCOUNTER — Ambulatory Visit: Payer: 59 | Admitting: Physical Therapy

## 2016-08-09 DIAGNOSIS — R262 Difficulty in walking, not elsewhere classified: Secondary | ICD-10-CM

## 2016-08-09 DIAGNOSIS — M25661 Stiffness of right knee, not elsewhere classified: Secondary | ICD-10-CM | POA: Diagnosis not present

## 2016-08-09 DIAGNOSIS — M25561 Pain in right knee: Secondary | ICD-10-CM

## 2016-08-09 DIAGNOSIS — M6281 Muscle weakness (generalized): Secondary | ICD-10-CM | POA: Diagnosis not present

## 2016-08-09 NOTE — Therapy (Signed)
[place], [place], [address] Phone: [phone]   Fax:  [phone]  Physical Therapy Treatment  Patient Details  Name: James Gallegos MRN: [id number] Date of Birth: 04-Jul-1950 Referring Provider: Dr [name]  Encounter Date: 08/09/2016      PT End of Session - 08/09/16 1309    Visit Number 15   Number of Visits 16   Date for PT Re-Evaluation 08/13/16   Authorization Type [facility]    PT Start Time [id number]   PT Stop Time 1058   PT Time Calculation (min) 43 min   Activity Tolerance Patient tolerated treatment well   Behavior During Therapy [facility] for tasks assessed/performed      Past Medical History:  Diagnosis Date  . Arthritis   . Deep vein thrombosis ([place])   . Hypertension   . Murmur, cardiac   . Sleep apnea    CPAP    Past Surgical History:  Procedure Laterality Date  . CERVICAL SPINE SURGERY N/A 1990  . FINGER SURGERY Right 2008   x 3 index finger  . KNEE ARTHROSCOPY Bilateral    L 2004, R 2005  . 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 TOTAL KNEE ARTHROPLASTY Right 06/01/2016   Procedure: TOTAL KNEE ARTHROPLASTY;  Surgeon: [name], MD;  Location: [place];  Service: Orthopedics;  Laterality: Right;    There were no vitals filed for this visit.      Subjective Assessment - 08/09/16 1130    Subjective Patient reports muscle soreness in his qaud and hamstirng from doing the bike. He feels like his knee is looser but it is sore compared to the last couple of visits.    Pertinent History DVT after past knee scope   How long can you sit comfortably? 1/2 hour befor the  knee stiffens    How long can you walk comfortably? < 300' w/o pain    Diagnostic tests Nothing post op    Patient Stated Goals To improve ability to walk and to go back to work    Currently in Pain? Yes   Pain Score 2    Pain Location Knee            [facility] PT Assessment - 08/09/16 0001      AROM   Right Knee Extension -3   Right Knee Flexion 115     Strength   Right Hip Flexion 5/5   Right Hip ABduction 5/5   Right Hip ADduction 5/5   Right Knee Flexion 5/5   Right Knee Extension 5/5   Left Knee Flexion 5/5   Left Knee Extension 5/5     Palpation   Patella mobility Improved but still tight patient encouraged to continue at home.                      [place] Adult PT Treatment/Exercise - 08/09/16 0001      Knee/Hip Exercises: Stretches   Active Hamstring Stretch 3 reps;10 seconds     Knee/Hip Exercises: Aerobic   Stationary Bike 5 min    Nustep 5 min L5     Knee/Hip Exercises: Machines for Strengthening   Total Gym Leg Press 100 sled 7 3x10      Knee/Hip Exercises: Standing   Lateral Step Up Limitations 8"x20    Forward Step Up Limitations 8"x20   Step Down Limitations 6"2x10   Functional Squat Limitations x20    Other Standing Knee  Exercises SLS 3x12 sec R    Other Standing Knee Exercises step onto air-ex.     Modalities   Modalities Moist Heat     Moist Heat Therapy   Number Minutes Moist Heat 10 Minutes   Moist Heat Location Knee     Manual Therapy   Manual therapy comments PROM into flexion and extension.                 PT Education - 08/09/16 1308    Education provided Yes   Education Details continue with stretching and strengthening    Person(s) Educated Patient   Methods Explanation   Comprehension Verbalized understanding;Returned demonstration          PT Short Term Goals - 08/09/16 1312      PT SHORT TERM GOAL #1   Title Patient will increase right knee passive flexion by 25 degrees    Time 4   Period Weeks   Status Achieved     PT SHORT TERM GOAL #2   Title Patient will demsotrate full passive right knee extension   Baseline continues to lack end range    Time 4   Period Weeks   Status On-going     PT SHORT TERM GOAL #3   Title Patient will increase gross right lower extremity strength to 4+/5     Baseline 5/5 today    Time 4   Period Weeks   Status Achieved     PT SHORT TERM GOAL #4   Title Patient will walk 400' without an assistive device with minimal antalgic gait    Baseline no longer using cane    Time 4   Period Weeks     PT SHORT TERM GOAL #5   Title Patient wil be independent with inital HEP    Time 4   Period Weeks   Status Achieved           PT Long Term Goals - 08/09/16 1312      PT LONG TERM GOAL #1   Title Patient will increase gorss knee flexion movement to 0-120 degrees without pain in order to get up and down off a low chair.    Baseline still lakcing end range extension    Time 8   Period Weeks   Status On-going     PT LONG TERM GOAL #2   Title Patient will ambulate 3000' without increased pain and without an antalgic gait in order to return to work    Baseline improved gait    Time 8   Period Weeks   Status Achieved     PT LONG TERM GOAL #3   Title Patient will be independent with final HEP for LE strength and stability    Time 8   Period Weeks   Status On-going     PT LONG TERM GOAL #4   Title Patient will demsotrate a 45% limitation on FOTO    Time 8   Period Weeks   Status On-going               Plan - 08/09/16 1309    Clinical Impression Statement Patient is making great progress. He is making full revolutions on the exercise bike. He is walking furhter without pain. He has some soreness since he started doing the exercise bike at home, but he feels like the pain is mostly musclular. His range is steadily improving. His lfexion reach 115 today after stretching and mobilization. He would bnefit from  2-3 more weeks of theray to maximize motion then he will be discharged to HEP. Strength measured at 5/5.    Rehab Potential Good   PT Frequency 2x / week   PT Duration 8 weeks   PT Treatment/Interventions ADLs/Self Care Home Management;Cryotherapy;Electrical Stimulation;Gait training;Stair training;Ultrasound;Moist  Heat;Iontophoresis 4mg /ml Dexamethasone;Functional mobility training;Therapeutic activities;Therapeutic exercise;Manual techniques;Patient/family education;Passive range of motion;Dry needling;Taping;Vasopneumatic Device;Neuromuscular re-education   PT Next Visit Plan continue with stretching and strengthening    PT Home Exercise Plan SAQ, LAQ, SLR , Quad set,    Consulted and Agree with Plan of Care Patient      Patient will benefit from skilled therapeutic intervention in order to improve the following deficits and impairments:  Abnormal gait, Decreased range of motion, Difficulty walking, Increased muscle spasms, Decreased endurance, Decreased mobility, Decreased strength, Increased edema, Impaired sensation  Visit Diagnosis: Stiffness of right knee, not elsewhere classified  Acute pain of right knee  Difficulty in walking, not elsewhere classified  Muscle weakness (generalized)     Problem List Patient Active Problem List   Diagnosis Date Noted  . Unilateral primary osteoarthritis, right knee 06/01/2016  . S/P total knee replacement using cement, right 06/01/2016  . Sleep apnea 05/19/2016  . Hypertension 05/19/2016  . Abdominal hernia 05/19/2016    Carney Living 08/09/2016, 5:37 PM  [facility] [address] [place], [place], [address] Phone: [phone]   Fax:  [phone]  Name: James Gallegos MRN: [id number] Date of Birth: 06/07/1951

## 2016-08-11 ENCOUNTER — Encounter: Payer: Self-pay | Admitting: Physical Therapy

## 2016-08-11 ENCOUNTER — Encounter (INDEPENDENT_AMBULATORY_CARE_PROVIDER_SITE_OTHER): Payer: Self-pay | Admitting: Orthopedic Surgery

## 2016-08-11 ENCOUNTER — Ambulatory Visit (INDEPENDENT_AMBULATORY_CARE_PROVIDER_SITE_OTHER): Payer: 59 | Admitting: Orthopedic Surgery

## 2016-08-11 ENCOUNTER — Ambulatory Visit: Payer: 59 | Admitting: Physical Therapy

## 2016-08-11 VITALS — BP 160/78 | HR 77 | Resp 14 | Ht 69.0 in | Wt 220.0 lb

## 2016-08-11 DIAGNOSIS — M6281 Muscle weakness (generalized): Secondary | ICD-10-CM | POA: Diagnosis not present

## 2016-08-11 DIAGNOSIS — M25561 Pain in right knee: Secondary | ICD-10-CM

## 2016-08-11 DIAGNOSIS — Z96651 Presence of right artificial knee joint: Secondary | ICD-10-CM

## 2016-08-11 DIAGNOSIS — M25661 Stiffness of right knee, not elsewhere classified: Secondary | ICD-10-CM | POA: Diagnosis not present

## 2016-08-11 DIAGNOSIS — R262 Difficulty in walking, not elsewhere classified: Secondary | ICD-10-CM

## 2016-08-11 MED ORDER — TRAMADOL HCL 50 MG PO TABS: 50.0000 mg | ORAL_TABLET | Freq: Four times a day (QID) | ORAL | 0 refills | Status: DC | PRN

## 2016-08-11 MED FILL — traMADol HCL 50 MG TABS: 50 | 7 days supply | Qty: 30 | Fill #0

## 2016-08-11 NOTE — Progress Notes (Signed)
   Office Visit Note   Patient: James Gallegos           Date of Birth: [birth year]           MRN: [id number] Visit Date: 08/11/2016              Requested by: No referring provider defined for this encounter. PCP: No PCP Per Patient   Assessment & Plan: Visit Diagnoses:  1. S/P total knee replacement using cement, right     Plan:  #1: Continue physical therapy #2: Given a prescription for tramadol for nighttime pain #3: Desensitize the scar and the anterior portion of his knee  Follow-Up Instructions: Return in about 3 weeks (around 09/01/2016).   Orders:  No orders of the defined types were placed in this encounter.  Meds ordered this encounter  Medications  . traMADol (ULTRAM) 50 MG tablet    Sig: Take 1 tablet (50 mg total) by mouth every 6 (six) hours as needed for moderate pain or severe pain.    Dispense:  30 tablet    Refill:  0      Procedures: No procedures performed   Clinical Data: No additional findings.   Subjective: Chief Complaint  Patient presents with  . Right Knee - Follow-up    Total Right Knee Replacement on 06/01/16. Patient states he is doing well. Having skin sensitivity. Currently in physical therapy.      Review of Systems   Objective: Vital Signs: BP (!) 160/78 (BP Location: Left Arm, Patient Position: Sitting, Cuff Size: Normal)   Pulse 77   Resp 14   Ht 5\' 9"  (1.753 m)   Wt 220 lb (99.8 kg)   BMI 32.49 kg/m   Physical Exam  Ortho Exam  Range of motion today is from near full extension to about 110. Ligamentous stability. Not much of an effusion. No warmth or erythema. He does have sensitivity over the distal portion of the medial aspect of the tibia. Wound looks excellent   Specialty Comments:  No specialty comments available.  Imaging: No results found.   PMFS History: Patient Active Problem List   Diagnosis Date Noted  . Unilateral primary osteoarthritis, right knee 06/01/2016  . S/P total knee  replacement using cement, right 06/01/2016  . Sleep apnea 05/19/2016  . Hypertension 05/19/2016  . Abdominal hernia 05/19/2016   Past Medical History:  Diagnosis Date  . Arthritis   . Deep vein thrombosis ([place])   . Hypertension   . Murmur, cardiac   . Sleep apnea    CPAP    Family History  Problem Relation Age of Onset  . Alzheimer's disease Father     Past Surgical History:  Procedure Laterality Date  . CERVICAL SPINE SURGERY N/A 1990  . FINGER SURGERY Right 2008   x 3 index finger  . KNEE ARTHROSCOPY Bilateral    L 2004, R 2005  . 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 TOTAL KNEE ARTHROPLASTY Right 06/01/2016   Procedure: TOTAL KNEE ARTHROPLASTY;  Surgeon: [name], MD;  Location: [place];  Service: Orthopedics;  Laterality: Right;   Social History   Occupational History  . Not on file.   Social History Main Topics  . Smoking status: Never Smoker  . Smokeless tobacco: Never Used  . Alcohol use Yes     Comment: rarely  . Drug use: No  . Sexual activity: Not on file

## 2016-08-11 NOTE — Therapy (Signed)
[place], [place], [address] Phone: [phone]   Fax:  [phone]  Physical Therapy Treatment  Patient Details  Name: James Gallegos MRN: [id number] Date of Birth: Oct 17, 1950 Referring Provider: Dr [name]  Encounter Date: 08/11/2016      PT End of Session - 08/11/16 1035    Visit Number 16   Number of Visits 16   Date for PT Re-Evaluation 08/13/16   Authorization Type [facility]    PT Start Time [id number]   PT Stop Time 1056   PT Time Calculation (min) 42 min   Activity Tolerance Patient tolerated treatment well   Behavior During Therapy [facility] for tasks assessed/performed      Past Medical History:  Diagnosis Date  . Arthritis   . Deep vein thrombosis ([place])   . Hypertension   . Murmur, cardiac   . Sleep apnea    CPAP    Past Surgical History:  Procedure Laterality Date  . CERVICAL SPINE SURGERY N/A 1990  . FINGER SURGERY Right 2008   x 3 index finger  . KNEE ARTHROSCOPY Bilateral    L 2004, R 2005  . 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 TOTAL KNEE ARTHROPLASTY Right 06/01/2016   Procedure: TOTAL KNEE ARTHROPLASTY;  Surgeon: [name], MD;  Location: [place];  Service: Orthopedics;  Laterality: Right;    There were no vitals filed for this visit.      Subjective Assessment - 08/11/16 1017    Subjective Patient was able to go inspect a house. He was able to climb the attic stairs. He feels like it is stiff today but he is having very little pain.    Pertinent History DVT after past knee scope   Limitations Standing;Walking   How long can you sit comfortably? 1/2 hour befor the  knee stiffens    How long can you stand comfortably? < 10 minutes    How long can you walk comfortably? < 300' w/o pain    Diagnostic tests Nothing post op    Patient Stated Goals To improve ability to walk and to go back to work    Currently in Pain? Yes   Pain Score 1    Pain Location Knee    Pain Orientation Right   Pain Descriptors / Indicators Aching   Pain Type Surgical pain   Pain Onset More than a month ago   Pain Frequency Constant   Aggravating Factors  extra time walking    Pain Relieving Factors rest, ice    Effect of Pain on Daily Activities continued stiffness                                  PT Education - 08/11/16 1034    Education provided Yes   Education Details continue with strethcing and strengthening    Person(s) Educated Patient   Methods Explanation   Comprehension Verbalized understanding          PT Short Term Goals - 08/09/16 1312      PT SHORT TERM GOAL #1   Title Patient will increase right knee passive flexion by 25 degrees    Time 4   Period Weeks   Status Achieved     PT SHORT TERM GOAL #2   Title Patient will demsotrate full passive right knee extension   Baseline continues to lack end range  Time 4   Period Weeks   Status On-going     PT SHORT TERM GOAL #3   Title Patient will increase gross right lower extremity strength to 4+/5    Baseline 5/5 today    Time 4   Period Weeks   Status Achieved     PT SHORT TERM GOAL #4   Title Patient will walk 400' without an assistive device with minimal antalgic gait    Baseline no longer using cane    Time 4   Period Weeks     PT SHORT TERM GOAL #5   Title Patient wil be independent with inital HEP    Time 4   Period Weeks   Status Achieved           PT Long Term Goals - 08/09/16 1312      PT LONG TERM GOAL #1   Title Patient will increase gorss knee flexion movement to 0-120 degrees without pain in order to get up and down off a low chair.    Baseline still lakcing end range extension    Time 8   Period Weeks   Status On-going     PT LONG TERM GOAL #2   Title Patient will ambulate 3000' without increased pain and without an antalgic gait in order to return to work    Baseline improved gait    Time 8   Period Weeks   Status Achieved      PT LONG TERM GOAL #3   Title Patient will be independent with final HEP for LE strength and stability    Time 8   Period Weeks   Status On-going     PT LONG TERM GOAL #4   Title Patient will demsotrate a 45% limitation on FOTO    Time 8   Period Weeks   Status On-going               Plan - 08/11/16 1035    Clinical Impression Statement Patient was stiffer this morning. PROM was measured at 3-111 degrees. Overall he is making good progress. He is getting back to fucntional avctivity. He continues to have some stiffneess in the mornign but he can work it out. He will continue for 2-3 weeks if MD agrees to continue progressing stretches. Recert next visit.    Rehab Potential Good   PT Frequency 2x / week   PT Duration 8 weeks   PT Treatment/Interventions ADLs/Self Care Home Management;Cryotherapy;Electrical Stimulation;Gait training;Stair training;Ultrasound;Moist Heat;Iontophoresis 4mg /ml Dexamethasone;Functional mobility training;Therapeutic activities;Therapeutic exercise;Manual techniques;Patient/family education;Passive range of motion;Dry needling;Taping;Vasopneumatic Device;Neuromuscular re-education   PT Next Visit Plan continue with stretching and strengthening; recertification    PT Home Exercise Plan SAQ, LAQ, SLR , Quad set,    Consulted and Agree with Plan of Care Patient      Patient will benefit from skilled therapeutic intervention in order to improve the following deficits and impairments:  Abnormal gait, Decreased range of motion, Difficulty walking, Increased muscle spasms, Decreased endurance, Decreased mobility, Decreased strength, Increased edema, Impaired sensation  Visit Diagnosis: Stiffness of right knee, not elsewhere classified  Acute pain of right knee  Difficulty in walking, not elsewhere classified  Muscle weakness (generalized)     Problem List Patient Active Problem List   Diagnosis Date Noted  . Unilateral primary osteoarthritis,  right knee 06/01/2016  . S/P total knee replacement using cement, right 06/01/2016  . Sleep apnea 05/19/2016  . Hypertension 05/19/2016  . Abdominal hernia 05/19/2016  Carney Living PT DPT  08/11/2016, 4:29 PM  [facility] [address] [place], [place], [address] Phone: [phone]   Fax:  [phone]  Name: James Gallegos MRN: [id number] Date of Birth: Aug 31, 1950

## 2016-08-19 ENCOUNTER — Encounter: Payer: Self-pay | Admitting: Physical Therapy

## 2016-08-19 ENCOUNTER — Ambulatory Visit: Payer: 59 | Attending: Orthopaedic Surgery | Admitting: Physical Therapy

## 2016-08-19 DIAGNOSIS — M6281 Muscle weakness (generalized): Secondary | ICD-10-CM | POA: Insufficient documentation

## 2016-08-19 DIAGNOSIS — R262 Difficulty in walking, not elsewhere classified: Secondary | ICD-10-CM | POA: Insufficient documentation

## 2016-08-19 DIAGNOSIS — M25561 Pain in right knee: Secondary | ICD-10-CM | POA: Diagnosis not present

## 2016-08-19 DIAGNOSIS — M25661 Stiffness of right knee, not elsewhere classified: Secondary | ICD-10-CM | POA: Insufficient documentation

## 2016-08-19 NOTE — Therapy (Signed)
[place], [place], [address] Phone: [phone]   Fax:  [phone]  Physical Therapy Treatment  Patient Details  Name: James Gallegos MRN: [id number] Date of Birth: 05-11-1951 Referring Provider: Dr [name]  Encounter Date: 08/19/2016      PT End of Session - 08/19/16 0913    Visit Number 17   Number of Visits 20   Date for PT Re-Evaluation 09/09/16   Authorization Type [facility]    PT Start Time 0845   PT Stop Time 0926   PT Time Calculation (min) 41 min   Activity Tolerance Patient tolerated treatment well   Behavior During Therapy [facility] for tasks assessed/performed      Past Medical History:  Diagnosis Date  . Arthritis   . Deep vein thrombosis ([place])   . Hypertension   . Murmur, cardiac   . Sleep apnea    CPAP    Past Surgical History:  Procedure Laterality Date  . CERVICAL SPINE SURGERY N/A 1990  . FINGER SURGERY Right 2008   x 3 index finger  . KNEE ARTHROSCOPY Bilateral    L 2004, R 2005  . 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 TOTAL KNEE ARTHROPLASTY Right 06/01/2016   Procedure: TOTAL KNEE ARTHROPLASTY;  Surgeon: [name], MD;  Location: [place];  Service: Orthopedics;  Laterality: Right;    There were no vitals filed for this visit.      Subjective Assessment - 08/19/16 0853    Subjective Patient reports a few days ago he had severe pain for a few seconds but then it got better. He went to the MD who is happy with his progress. He has been able to go look at houses. It is still stiff when he begins to use the bike but then the pain improves.    Pertinent History DVT after past knee scope   Limitations Standing;Walking   How long can you sit comfortably? 1/2 hour befor the  knee stiffens    How long can you stand comfortably? < 10 minutes    How long can you walk comfortably? < 300' w/o pain    Diagnostic tests Nothing post op    Patient Stated Goals To  improve ability to walk and to go back to work    Currently in Pain? No/denies                         [facility] Adult PT Treatment/Exercise - 08/19/16 0001      Knee/Hip Exercises: Stretches   Active Hamstring Stretch 3 reps;10 seconds     Knee/Hip Exercises: Aerobic   Stationary Bike 5 min    Nustep 5 min L5     Knee/Hip Exercises: Machines for Strengthening   Total Gym Leg Press 100 sled 7 3x10      Knee/Hip Exercises: Standing   Lateral Step Up Limitations 8"x20    Forward Step Up Limitations 8"x20   Step Down Limitations 6"2x10   Functional Squat Limitations x20    Other Standing Knee Exercises SLS 3x12 sec R    Other Standing Knee Exercises step onto air-ex.     Modalities   Modalities Moist Heat     Moist Heat Therapy   Moist Heat Location Knee     Manual Therapy   Manual therapy comments PROM into flexion and extension.  PT Education - 08/19/16 0912    Education provided Yes   Education Details continue stretching and strengthening    Person(s) Educated Patient   Methods Explanation   Comprehension Verbalized understanding;Returned demonstration          PT Short Term Goals - 08/19/16 1528      PT SHORT TERM GOAL #1   Title Patient will increase right knee passive flexion by 25 degrees    Baseline 10   Time 4   Period Weeks   Status Achieved     PT SHORT TERM GOAL #2   Title Patient will demsotrate full passive right knee extension   Baseline full range 08/19/2016   Time 4   Period Weeks   Status Achieved     PT SHORT TERM GOAL #3   Title Patient will increase gross right lower extremity strength to 4+/5    Baseline 5/5 today    Time 4   Period Weeks   Status Achieved     PT SHORT TERM GOAL #4   Title Patient will walk 400' without an assistive device with minimal antalgic gait    Baseline no longer using cane    Time 4   Period Weeks   Status Achieved     PT SHORT TERM GOAL #5   Title Patient wil  be independent with inital HEP    Baseline perfroming intial exercises at home    Time 4   Period Weeks   Status Achieved           PT Long Term Goals - 08/19/16 1529      PT LONG TERM GOAL #1   Title Patient will increase gorss knee flexion movement to 0-120 degrees without pain in order to get up and down off a low chair.    Baseline approaching goal    Time 8   Period Weeks   Status On-going     PT LONG TERM GOAL #2   Title Patient will ambulate 3000' without increased pain and without an antalgic gait in order to return to work    Baseline improved gait    Time 8   Period Weeks   Status Achieved     PT LONG TERM GOAL #3   Title Patient will be independent with final HEP for LE strength and stability    Time 8   Period Weeks   Status On-going     PT LONG TERM GOAL #4   Title Patient will demsotrate a 45% limitation on FOTO    Time 8   Period Weeks   Status On-going     PT LONG TERM GOAL #5   Title Patient will go up/down 8 steps without increased pain in order to perfrom ADL's    Time 8   Period Weeks   Status On-going               Plan - 08/19/16 0915    Clinical Impression Statement Patients ROM has improved. He was measured at 0-117 degrees today. He is makinggood progress. He would benefit from furhter skilled therapy to improve his motion and continue to progress his ablity to go down stairs.    Rehab Potential Good   PT Frequency 2x / week   PT Duration 4 weeks   PT Treatment/Interventions ADLs/Self Care Home Management;Cryotherapy;Electrical Stimulation;Gait training;Stair training;Ultrasound;Moist Heat;Iontophoresis 4mg /ml Dexamethasone;Functional mobility training;Therapeutic activities;Therapeutic exercise;Manual techniques;Patient/family education;Passive range of motion;Dry needling;Taping;Vasopneumatic Device;Neuromuscular re-education   PT Next Visit Plan continue with  stretching and strengthening; recertification    PT Home Exercise Plan  SAQ, LAQ, SLR , Quad set,    Consulted and Agree with Plan of Care Patient      Patient will benefit from skilled therapeutic intervention in order to improve the following deficits and impairments:  Abnormal gait, Decreased range of motion, Difficulty walking, Increased muscle spasms, Decreased endurance, Decreased mobility, Decreased strength, Increased edema, Impaired sensation  Visit Diagnosis: Stiffness of right knee, not elsewhere classified  Acute pain of right knee  Difficulty in walking, not elsewhere classified  Muscle weakness (generalized)     Problem List Patient Active Problem List   Diagnosis Date Noted  . Unilateral primary osteoarthritis, right knee 06/01/2016  . S/P total knee replacement using cement, right 06/01/2016  . Sleep apnea 05/19/2016  . Hypertension 05/19/2016  . Abdominal hernia 05/19/2016    Carney Living PT DPT  08/19/2016, 3:32 PM  [facility] [address] [place], [place], [address] Phone: [phone]   Fax:  [phone]  Name: James Gallegos MRN: [id number] Date of Birth: 03-Oct-1950

## 2016-08-20 ENCOUNTER — Ambulatory Visit: Payer: 59 | Attending: Orthopaedic Surgery | Admitting: Physical Therapy

## 2016-08-20 ENCOUNTER — Encounter: Payer: Self-pay | Admitting: Physical Therapy

## 2016-08-20 DIAGNOSIS — M6281 Muscle weakness (generalized): Secondary | ICD-10-CM | POA: Diagnosis not present

## 2016-08-20 DIAGNOSIS — M25661 Stiffness of right knee, not elsewhere classified: Secondary | ICD-10-CM | POA: Diagnosis not present

## 2016-08-20 DIAGNOSIS — R262 Difficulty in walking, not elsewhere classified: Secondary | ICD-10-CM | POA: Diagnosis not present

## 2016-08-20 DIAGNOSIS — M25561 Pain in right knee: Secondary | ICD-10-CM | POA: Insufficient documentation

## 2016-08-20 NOTE — Therapy (Signed)
[place], [place], [address] Phone: [phone]   Fax:  [phone]  Physical Therapy Treatment  Patient Details  Name: James Gallegos MRN: [id number] Date of Birth: 30-Sep-1950 Referring Provider: Dr [name]  Encounter Date: 08/20/2016      PT End of Session - 08/20/16 0923    Visit Number 18   Number of Visits 24   Date for PT Re-Evaluation 09/09/16   Authorization Type [facility]    PT Start Time 0847   PT Stop Time 0926   PT Time Calculation (min) 39 min   Activity Tolerance Patient tolerated treatment well   Behavior During Therapy [facility] for tasks assessed/performed      Past Medical History:  Diagnosis Date  . Arthritis   . Deep vein thrombosis ([place])   . Hypertension   . Murmur, cardiac   . Sleep apnea    CPAP    Past Surgical History:  Procedure Laterality Date  . CERVICAL SPINE SURGERY N/A 1990  . FINGER SURGERY Right 2008   x 3 index finger  . KNEE ARTHROSCOPY Bilateral    L 2004, R 2005  . 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 TOTAL KNEE ARTHROPLASTY Right 06/01/2016   Procedure: TOTAL KNEE ARTHROPLASTY;  Surgeon: [name], MD;  Location: [place];  Service: Orthopedics;  Laterality: Right;    There were no vitals filed for this visit.      Subjective Assessment - 08/20/16 0855    Subjective Patient reports his knee has been sore after the last visit around his knee cap. His last appointment was yesterday. He has no new complaints.    Pertinent History DVT after past knee scope   Limitations Standing;Walking   How long can you sit comfortably? 1/2 hour befor the  knee stiffens    How long can you stand comfortably? < 10 minutes    How long can you walk comfortably? < 300' w/o pain    Diagnostic tests Nothing post op    Patient Stated Goals To improve ability to walk and to go back to work    Currently in Pain? Yes   Pain Score 3    Pain Location Knee   Pain Orientation Right   Pain Descriptors / Indicators Aching   Pain Type Surgical pain   Pain Onset More than a month ago   Pain Frequency Constant   Aggravating Factors  extrat time walking    Pain Relieving Factors rest, ice    Effect of Pain on Daily Activities continued stiffness    Multiple Pain Sites No                         [facility] Adult PT Treatment/Exercise - 08/20/16 0001      Knee/Hip Exercises: Stretches   Active Hamstring Stretch 3 reps;10 seconds     Knee/Hip Exercises: Aerobic   Nustep 5 min L5     Knee/Hip Exercises: Machines for Strengthening   Total Gym Leg Press 100 sled 7 3x10      Knee/Hip Exercises: Standing   Lateral Step Up Limitations 8"x20    Forward Step Up Limitations 8"x20   Step Down Limitations 6"2x10   Functional Squat Limitations x20    Other Standing Knee Exercises step onto air-ex.     Modalities   Modalities --     Manual Therapy   Manual therapy comments PROM into flexion and extension.  Grade II and III AP and PA mobilizations,,  4 way pateallar mobilizations                 PT Education - 08/20/16 [phone]    Education provided Yes   Education Details continue to work on stretching and strengthneing    Person(s) Educated Patient   Methods Explanation   Comprehension Returned demonstration;Verbalized understanding          PT Short Term Goals - 08/19/16 1528      PT SHORT TERM GOAL #1   Title Patient will increase right knee passive flexion by 25 degrees    Baseline 10   Time 4   Period Weeks   Status Achieved     PT SHORT TERM GOAL #2   Title Patient will demsotrate full passive right knee extension   Baseline full range 08/19/2016   Time 4   Period Weeks   Status Achieved     PT SHORT TERM GOAL #3   Title Patient will increase gross right lower extremity strength to 4+/5    Baseline 5/5 today    Time 4   Period Weeks   Status Achieved     PT SHORT TERM GOAL #4   Title Patient will walk  400' without an assistive device with minimal antalgic gait    Baseline no longer using cane    Time 4   Period Weeks   Status Achieved     PT SHORT TERM GOAL #5   Title Patient wil be independent with inital HEP    Baseline perfroming intial exercises at home    Time 4   Period Weeks   Status Achieved           PT Long Term Goals - 08/19/16 1529      PT LONG TERM GOAL #1   Title Patient will increase gorss knee flexion movement to 0-120 degrees without pain in order to get up and down off a low chair.    Baseline approaching goal    Time 8   Period Weeks   Status On-going     PT LONG TERM GOAL #2   Title Patient will ambulate 3000' without increased pain and without an antalgic gait in order to return to work    Baseline improved gait    Time 8   Period Weeks   Status Achieved     PT LONG TERM GOAL #3   Title Patient will be independent with final HEP for LE strength and stability    Time 8   Period Weeks   Status On-going     PT LONG TERM GOAL #4   Title Patient will demsotrate a 45% limitation on FOTO    Time 8   Period Weeks   Status On-going     PT LONG TERM GOAL #5   Title Patient will go up/down 8 steps without increased pain in order to perfrom ADL's    Time 8   Period Weeks   Status On-going               Plan - 08/20/16 0947    Clinical Impression Statement Patients range was tighter today but he is still doing well. Therapy continues to focus on going down the steps and stretches. He was soire with step downs but was able to complete a six inch step down.    PT Frequency 2x / week   PT Duration 8 weeks   PT Treatment/Interventions ADLs/Self  Care Home Management;Cryotherapy;Electrical Stimulation;Gait training;Stair training;Ultrasound;Moist Heat;Iontophoresis 4mg /ml Dexamethasone;Functional mobility training;Therapeutic activities;Therapeutic exercise;Manual techniques;Patient/family education;Passive range of motion;Dry  needling;Taping;Vasopneumatic Device;Neuromuscular re-education   PT Next Visit Plan continue with stretching and strengthening; recertification    PT Home Exercise Plan SAQ, LAQ, SLR , Quad set,    Consulted and Agree with Plan of Care Patient      Patient will benefit from skilled therapeutic intervention in order to improve the following deficits and impairments:  Abnormal gait, Decreased range of motion, Difficulty walking, Increased muscle spasms, Decreased endurance, Decreased mobility, Decreased strength, Increased edema, Impaired sensation  Visit Diagnosis: Stiffness of right knee, not elsewhere classified  Acute pain of right knee  Difficulty in walking, not elsewhere classified  Muscle weakness (generalized)     Problem List Patient Active Problem List   Diagnosis Date Noted  . Unilateral primary osteoarthritis, right knee 06/01/2016  . S/P total knee replacement using cement, right 06/01/2016  . Sleep apnea 05/19/2016  . Hypertension 05/19/2016  . Abdominal hernia 05/19/2016    Carney Living PT DPT 08/20/2016, 12:53 PM  [facility] [address] [place], [place], [address] Phone: [phone]   Fax:  [phone]  Name: NASIF BOS MRN: [id number] Date of Birth: [birth year]

## 2016-08-24 ENCOUNTER — Ambulatory Visit: Payer: 59 | Attending: Orthopaedic Surgery | Admitting: Physical Therapy

## 2016-08-24 DIAGNOSIS — M25661 Stiffness of right knee, not elsewhere classified: Secondary | ICD-10-CM

## 2016-08-24 DIAGNOSIS — M25561 Pain in right knee: Secondary | ICD-10-CM | POA: Diagnosis not present

## 2016-08-24 DIAGNOSIS — M6281 Muscle weakness (generalized): Secondary | ICD-10-CM | POA: Diagnosis not present

## 2016-08-24 DIAGNOSIS — R262 Difficulty in walking, not elsewhere classified: Secondary | ICD-10-CM | POA: Diagnosis not present

## 2016-08-24 NOTE — Therapy (Signed)
[place], [place], [address] Phone: [phone]   Fax:  [phone]  Physical Therapy Treatment  Patient Details  Name: James Gallegos MRN: [id number] Date of Birth: 1950-11-11 Referring Provider: Dr [name]  Encounter Date: 08/24/2016      PT End of Session - 08/24/16 0944    Visit Number 22   PT Start Time 4235   PT Stop Time 1016   PT Time Calculation (min) 38 min   Activity Tolerance Patient tolerated treatment well   Behavior During Therapy James Gallegos for tasks assessed/performed      Past Medical History:  Diagnosis Date  . Arthritis   . Deep vein thrombosis ([place])   . Hypertension   . Murmur, cardiac   . Sleep apnea    CPAP    Past Surgical History:  Procedure Laterality Date  . CERVICAL SPINE SURGERY N/A 1990  . FINGER SURGERY Right 2008   x 3 index finger  . KNEE ARTHROSCOPY Bilateral    L 2004, R 2005  . 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 TOTAL KNEE ARTHROPLASTY Right 06/01/2016   Procedure: TOTAL KNEE ARTHROPLASTY;  Surgeon: [name], MD;  Location: [place];  Service: Orthopedics;  Laterality: Right;    There were no vitals filed for this visit.      Subjective Assessment - 08/24/16 0942    Subjective Patient had a pian in the front of his knee but it only lasted a few seconds. It was not as intense this tim.    Pertinent History DVT after past knee scope   Limitations Standing;Walking   How long can you sit comfortably? 1/2 hour befor the  knee stiffens    How long can you stand comfortably? < 10 minutes    How long can you walk comfortably? < 300' w/o pain    Diagnostic tests Nothing post op    Patient Stated Goals To improve ability to walk and to go back to work    Currently in Pain? No/denies                         [facility] Adult PT Treatment/Exercise - 08/24/16 0001      Knee/Hip Exercises: Stretches   Active Hamstring Stretch  3 reps;10 seconds     Knee/Hip Exercises: Aerobic   Nustep 5 min L5     Knee/Hip Exercises: Machines for Strengthening   Total Gym Leg Press 100 sled 7 3x10      Knee/Hip Exercises: Standing   Lateral Step Up Limitations 8"x20    Forward Step Up Limitations 8"x20   Step Down Limitations 6"2x10   Functional Squat Limitations x20    Other Standing Knee Exercises step onto air-ex.     Manual Therapy   Manual therapy comments PROM into flexion and extension. Grade II and III AP and PA mobilizations,,  4 way pateallar mobilizations                 PT Education - 08/24/16 0943    Education provided Yes   Education Details continue with strengthening    Person(s) Educated Patient   Methods Explanation   Comprehension Verbalized understanding;Returned demonstration          PT Short Term Goals - 08/19/16 1528      PT SHORT TERM GOAL #1   Title Patient will increase right knee passive flexion by 25 degrees    Baseline  10   Time 4   Period Weeks   Status Achieved     PT SHORT TERM GOAL #2   Title Patient will demsotrate full passive right knee extension   Baseline full range 08/19/2016   Time 4   Period Weeks   Status Achieved     PT SHORT TERM GOAL #3   Title Patient will increase gross right lower extremity strength to 4+/5    Baseline 5/5 today    Time 4   Period Weeks   Status Achieved     PT SHORT TERM GOAL #4   Title Patient will walk 400' without an assistive device with minimal antalgic gait    Baseline no longer using cane    Time 4   Period Weeks   Status Achieved     PT SHORT TERM GOAL #5   Title Patient wil be independent with inital HEP    Baseline perfroming intial exercises at home    Time 4   Period Weeks   Status Achieved           PT Long Term Goals - 08/19/16 1529      PT LONG TERM GOAL #1   Title Patient will increase gorss knee flexion movement to 0-120 degrees without pain in order to get up and down off a low chair.     Baseline approaching goal    Time 8   Period Weeks   Status On-going     PT LONG TERM GOAL #2   Title Patient will ambulate 3000' without increased pain and without an antalgic gait in order to return to work    Baseline improved gait    Time 8   Period Weeks   Status Achieved     PT LONG TERM GOAL #3   Title Patient will be independent with final HEP for LE strength and stability    Time 8   Period Weeks   Status On-going     PT LONG TERM GOAL #4   Title Patient will demsotrate a 45% limitation on FOTO    Time 8   Period Weeks   Status On-going     PT LONG TERM GOAL #5   Title Patient will go up/down 8 steps without increased pain in order to perfrom ADL's    Time 8   Period Weeks   Status On-going               Plan - 08/24/16 1001    Clinical Impression Statement Patient was stiff today but he has not been able to do the bike much. He was encouraged to continue with the step stretch and the bike. He was 8 minutes late 2nd to the appointment 2nd to the snow. He had no signifianct pain    Rehab Potential Good   PT Frequency 2x / week   PT Duration 8 weeks   PT Treatment/Interventions ADLs/Self Care Home Management;Cryotherapy;Electrical Stimulation;Gait training;Stair training;Ultrasound;Moist Heat;Iontophoresis 4mg /ml Dexamethasone;Functional mobility training;Therapeutic activities;Therapeutic exercise;Manual techniques;Patient/family education;Passive range of motion;Dry needling;Taping;Vasopneumatic Device;Neuromuscular re-education   PT Next Visit Plan continue with stretching and strengthening; recertification    PT Home Exercise Plan SAQ, LAQ, SLR , Quad set,    Consulted and Agree with Plan of Care Patient      Patient will benefit from skilled therapeutic intervention in order to improve the following deficits and impairments:  Abnormal gait, Decreased range of motion, Difficulty walking, Increased muscle spasms, Decreased endurance, Decreased mobility,  Decreased strength, Increased  edema, Impaired sensation  Visit Diagnosis: Stiffness of right knee, not elsewhere classified  Acute pain of right knee  Difficulty in walking, not elsewhere classified  Muscle weakness (generalized)     Problem List Patient Active Problem List   Diagnosis Date Noted  . Unilateral primary osteoarthritis, right knee 06/01/2016  . S/P total knee replacement using cement, right 06/01/2016  . Sleep apnea 05/19/2016  . Hypertension 05/19/2016  . Abdominal hernia 05/19/2016    Carney Living PT DPT  08/24/2016, 11:37 AM  [facility] [address] [place], [place], [address] Phone: [phone]   Fax:  [phone]  Name: James Gallegos MRN: [id number] Date of Birth: 21-Aug-1950

## 2016-08-26 ENCOUNTER — Ambulatory Visit: Payer: 59 | Admitting: Physical Therapy

## 2016-08-26 DIAGNOSIS — M25661 Stiffness of right knee, not elsewhere classified: Secondary | ICD-10-CM

## 2016-08-26 DIAGNOSIS — R262 Difficulty in walking, not elsewhere classified: Secondary | ICD-10-CM | POA: Diagnosis not present

## 2016-08-26 DIAGNOSIS — M6281 Muscle weakness (generalized): Secondary | ICD-10-CM | POA: Diagnosis not present

## 2016-08-26 DIAGNOSIS — M25561 Pain in right knee: Secondary | ICD-10-CM

## 2016-08-26 NOTE — Therapy (Signed)
[place], [place], [address] Phone: [phone]   Fax:  [phone]  Physical Therapy Treatment  Patient Details  Name: James Gallegos MRN: [id number] Date of Birth: [birth year] Referring Provider: Dr [name]  Encounter Date: 08/26/2016      PT End of Session - 08/26/16 0913    Visit Number 20   Number of Visits 24   Date for PT Re-Evaluation 09/09/16   Authorization Type [facility] U[facility]    PT Start Time 0800   PT Stop Time 0843   PT Time Calculation (min) 43 min   Activity Tolerance Patient tolerated treatment well   Behavior During Therapy [facility] for tasks assessed/performed      Past Medical History:  Diagnosis Date  . Arthritis   . Deep vein thrombosis ([place])   . Hypertension   . Murmur, cardiac   . Sleep apnea    CPAP    Past Surgical History:  Procedure Laterality Date  . CERVICAL SPINE SURGERY N/A 1990  . FINGER SURGERY Right 2008   x 3 index finger  . KNEE ARTHROSCOPY Bilateral    L 2004, R 2005  . 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 TOTAL KNEE ARTHROPLASTY Right 06/01/2016   Procedure: TOTAL KNEE ARTHROPLASTY;  Surgeon: [name], MD;  Location: [place];  Service: Orthopedics;  Laterality: Right;    There were no vitals filed for this visit.      Subjective Assessment - 08/26/16 0911    Subjective Patient continues to have a minor pain in his anterior medial knee that improves quickly. He has been doing his exercises without difficulty.    Pertinent History DVT after past knee scope   Limitations Standing;Walking   How long can you sit comfortably? 1/2 hour befor the  knee stiffens    How long can you stand comfortably? < 10 minutes    How long can you walk comfortably? < 300' w/o pain    Diagnostic tests Nothing post op    Patient Stated Goals To improve ability to walk and to go back to work    Currently in Pain? No/denies                          [facility] Adult PT Treatment/Exercise - 08/26/16 0001      Knee/Hip Exercises: Stretches   Active Hamstring Stretch 3 reps;10 seconds     Knee/Hip Exercises: Aerobic   Nustep 5 min L5     Knee/Hip Exercises: Machines for Strengthening   Total Gym Leg Press 120 sled 7 3x10      Knee/Hip Exercises: Standing   Lateral Step Up Limitations 8"x20    Forward Step Up Limitations 8"x20   Step Down Limitations 8"2x10   Functional Squat Limitations x20    Other Standing Knee Exercises step onto air-ex.     Manual Therapy   Manual therapy comments PROM into flexion and extension. Grade II and III AP and PA mobilizations,,  4 way pateallar mobilizations                 PT Education - 08/26/16 0912    Education provided Yes   Education Details continue stretcing, continue with HEP    Person(s) Educated Patient   Methods Explanation   Comprehension Verbalized understanding;Returned demonstration          PT Short Term Goals - 08/19/16 1528      PT  SHORT TERM GOAL #1   Title Patient will increase right knee passive flexion by 25 degrees    Baseline 10   Time 4   Period Weeks   Status Achieved     PT SHORT TERM GOAL #2   Title Patient will demsotrate full passive right knee extension   Baseline full range 08/19/2016   Time 4   Period Weeks   Status Achieved     PT SHORT TERM GOAL #3   Title Patient will increase gross right lower extremity strength to 4+/5    Baseline 5/5 today    Time 4   Period Weeks   Status Achieved     PT SHORT TERM GOAL #4   Title Patient will walk 400' without an assistive device with minimal antalgic gait    Baseline no longer using cane    Time 4   Period Weeks   Status Achieved     PT SHORT TERM GOAL #5   Title Patient wil be independent with inital HEP    Baseline perfroming intial exercises at home    Time 4   Period Weeks   Status Achieved           PT Long Term Goals - 08/26/16 8032       PT LONG TERM GOAL #1   Title Patient will increase gorss knee flexion movement to 0-120 degrees without pain in order to get up and down off a low chair.    Baseline 0-117   Time 8   Period Weeks   Status On-going     PT LONG TERM GOAL #2   Title Patient will ambulate 3000' without increased pain and without an antalgic gait in order to return to work    Baseline no antalgic gait    Time 8   Period Weeks   Status Achieved     PT LONG TERM GOAL #3   Title Patient will be independent with final HEP for LE strength and stability    Time 8   Period Weeks   Status On-going     PT LONG TERM GOAL #4   Title Patient will demsotrate a 45% limitation on FOTO    Baseline 28%   Time 8   Period Weeks   Status Achieved     PT LONG TERM GOAL #5   Title Patient will go up/down 8 steps without increased pain in order to perfrom ADL's    Baseline ABLE TO GO DOWN 8 [facility] STEP TODAY    Time 8   Period Weeks   Status Achieved               Plan - 08/26/16 0913    Clinical Impression Statement Patient scored a 28% limitation of FOTO. His range was measured at 117. He is making great progress. He may come back for 1 visit if needed otherwise he will likely discharge, He was advised toi continure exercises fdor at least 3 moire months. He    Rehab Potential Good   PT Frequency 2x / week   PT Duration 8 weeks   PT Treatment/Interventions ADLs/Self Care Home Management;Cryotherapy;Electrical Stimulation;Gait training;Stair training;Ultrasound;Moist Heat;Iontophoresis 4mg /ml Dexamethasone;Functional mobility training;Therapeutic activities;Therapeutic exercise;Manual techniques;Patient/family education;Passive range of motion;Dry needling;Taping;Vasopneumatic Device;Neuromuscular re-education   PT Next Visit Plan continue with stretching and strengthening; recertification    PT Home Exercise Plan SAQ, LAQ, SLR , Quad set,    Consulted and Agree with Plan of Care Patient  Patient  will benefit from skilled therapeutic intervention in order to improve the following deficits and impairments:  Abnormal gait, Decreased range of motion, Difficulty walking, Increased muscle spasms, Decreased endurance, Decreased mobility, Decreased strength, Increased edema, Impaired sensation  Visit Diagnosis: Stiffness of right knee, not elsewhere classified  Acute pain of right knee  Difficulty in walking, not elsewhere classified  Muscle weakness (generalized)     Problem List Patient Active Problem List   Diagnosis Date Noted  . Unilateral primary osteoarthritis, right knee 06/01/2016  . S/P total knee replacement using cement, right 06/01/2016  . Sleep apnea 05/19/2016  . Hypertension 05/19/2016  . Abdominal hernia 05/19/2016    Carney Living PT DPT  08/26/2016, 9:38 AM  [facility] [address] [place], [place], [address] Phone: [phone]   Fax:  [phone]  Name: SHARIFF LASKY MRN: [id number] Date of Birth: 12-19-1950

## 2016-08-27 ENCOUNTER — Ambulatory Visit (INDEPENDENT_AMBULATORY_CARE_PROVIDER_SITE_OTHER): Payer: 59 | Admitting: Orthopaedic Surgery

## 2016-08-27 ENCOUNTER — Encounter (INDEPENDENT_AMBULATORY_CARE_PROVIDER_SITE_OTHER): Payer: Self-pay | Admitting: Orthopaedic Surgery

## 2016-08-27 VITALS — BP 158/64 | HR 76 | Ht 69.0 in | Wt 220.0 lb

## 2016-08-27 DIAGNOSIS — Z96651 Presence of right artificial knee joint: Secondary | ICD-10-CM

## 2016-08-27 DIAGNOSIS — M79671 Pain in right foot: Secondary | ICD-10-CM

## 2016-08-27 MED ORDER — DICLOFENAC SODIUM 2 % TD SOLN: 2.0000 g | Freq: Four times a day (QID) | TRANSDERMAL | 2 refills | Status: DC | PRN

## 2016-08-27 NOTE — Progress Notes (Signed)
   Office Visit Note   Patient: James Gallegos           Date of Birth: 21-Apr-1951           MRN: [id number] Visit Date: 08/27/2016              Requested by: No referring provider defined for this encounter. PCP: No PCP Per Patient   Assessment & Plan: Visit Diagnoses: 2 months status post primary right total knee replacement-doing well. 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 Voltaren as needed for right foot pain . Office 3 months. No refill on tramadol  Follow-Up Instructions: No Follow-up on file.   Orders:  No orders of the defined types were placed in this encounter.  No orders of the defined types were placed in this encounter.     Procedures: No procedures performed   Clinical Data: No additional findings.   Subjective: Chief Complaint  Patient presents with  . Right Knee - Routine Post Op    Patient is a 66 y.o male who presents today for follow up right total knee arthroplasty from 06/01/16. He has one more scheduled physical therapy visit. He is making progress, and questions what is the next step. He is at 117 degree flexion. He is taking tramadol for his pain on prn basis. He requests a refill for this today.     Review of Systems   Objective: Vital Signs: There were no vitals taken for this visit.  Physical Exam  Ortho Exam right knee wound healing per primam.evidence of any abnormality. No knee effusion. No instability. Lacks just a few degrees of full extension and flexed over 110. No calf pain. No swelling distally. Neurovascular exam intact. No change in exam of right foot  Specialty Comments:  No specialty comments available.  Imaging: No results found.   PMFS History: Patient Active Problem List   Diagnosis Date Noted  . Unilateral primary osteoarthritis, right knee 06/01/2016  . S/P total knee replacement using cement, right 06/01/2016   . Sleep apnea 05/19/2016  . Hypertension 05/19/2016  . Abdominal hernia 05/19/2016   Past Medical History:  Diagnosis Date  . Arthritis   . Deep vein thrombosis ([place])   . Hypertension   . Murmur, cardiac   . Sleep apnea    CPAP    Family History  Problem Relation Age of Onset  . Alzheimer's disease Father     Past Surgical History:  Procedure Laterality Date  . CERVICAL SPINE SURGERY N/A 1990  . FINGER SURGERY Right 2008   x 3 index finger  . KNEE ARTHROSCOPY Bilateral    L 2004, R 2005  . 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 TOTAL KNEE ARTHROPLASTY Right 06/01/2016   Procedure: TOTAL KNEE ARTHROPLASTY;  Surgeon: [name], MD;  Location: [place];  Service: Orthopedics;  Laterality: Right;   Social History   Occupational History  . Not on file.   Social History Main Topics  . Smoking status: Never Smoker  . Smokeless tobacco: Never Used  . Alcohol use Yes     Comment: rarely  . Drug use: No  . Sexual activity: Not on file

## 2016-08-30 ENCOUNTER — Encounter: Payer: Self-pay | Admitting: Physical Therapy

## 2016-08-30 ENCOUNTER — Ambulatory Visit: Payer: 59 | Admitting: Physical Therapy

## 2016-08-30 DIAGNOSIS — M6281 Muscle weakness (generalized): Secondary | ICD-10-CM | POA: Diagnosis not present

## 2016-08-30 DIAGNOSIS — M25561 Pain in right knee: Secondary | ICD-10-CM | POA: Diagnosis not present

## 2016-08-30 DIAGNOSIS — R262 Difficulty in walking, not elsewhere classified: Secondary | ICD-10-CM

## 2016-08-30 DIAGNOSIS — M25661 Stiffness of right knee, not elsewhere classified: Secondary | ICD-10-CM | POA: Diagnosis not present

## 2016-08-30 NOTE — Therapy (Signed)
[place], [place], [address] Phone: [phone]   Fax:  [phone]  Physical Therapy Treatment  Patient Details  Name: James Gallegos MRN: [id number] Date of Birth: 03-28-1951 Referring Provider: Dr [name]  Encounter Date: 08/30/2016      PT End of Session - 08/30/16 1046    Visit Number 21   Number of Visits 24   Date for PT Re-Evaluation 09/09/16   Authorization Type [facility]    PT Start Time 1017   PT Stop Time 1100   PT Time Calculation (min) 43 min   Activity Tolerance Patient tolerated treatment well   Behavior During Therapy [facility] for tasks assessed/performed      Past Medical History:  Diagnosis Date  . Arthritis   . Deep vein thrombosis ([place])   . Hypertension   . Murmur, cardiac   . Sleep apnea    CPAP    Past Surgical History:  Procedure Laterality Date  . CERVICAL SPINE SURGERY N/A 1990  . FINGER SURGERY Right 2008   x 3 index finger  . KNEE ARTHROSCOPY Bilateral    L 2004, R 2005  . 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 TOTAL KNEE ARTHROPLASTY Right 06/01/2016   Procedure: TOTAL KNEE ARTHROPLASTY;  Surgeon: [name], MD;  Location: [place];  Service: Orthopedics;  Laterality: Right;    There were no vitals filed for this visit.      Subjective Assessment - 08/30/16 1043    Subjective Patient went to the MD. He dosent need to go back for 3 months. The MD commented on his extension. He is otherwise doing well.    Pertinent History DVT after past knee scope   Limitations Standing;Walking   How long can you sit comfortably? 1/2 hour befor the  knee stiffens    How long can you stand comfortably? < 10 minutes    How long can you walk comfortably? < 300' w/o pain    Diagnostic tests Nothing post op    Patient Stated Goals To improve ability to walk and to go back to work    Currently in Pain? No/denies                         [facility]  Adult PT Treatment/Exercise - 08/30/16 0001      Knee/Hip Exercises: Stretches   Active Hamstring Stretch 3 reps;10 seconds     Knee/Hip Exercises: Aerobic   Nustep 5 min L5     Knee/Hip Exercises: Machines for Strengthening   Total Gym Leg Press 120 sled 7 3x10      Knee/Hip Exercises: Standing   Lateral Step Up Limitations 8"x20    Forward Step Up Limitations 8"x20   Step Down Limitations 8"2x10   Functional Squat Limitations x20    Other Standing Knee Exercises step onto air-ex.     Manual Therapy   Manual therapy comments PROM into flexion and extension. Grade II and III AP and PA mobilizations,,  4 way pateallar mobilizations. Focus on extension.                 PT Education - 08/30/16 1044    Education provided Yes   Education Details continue with stretching and strengthening    Person(s) Educated Patient   Methods Explanation   Comprehension Verbalized understanding          PT Short Term Goals - 08/19/16 1528  PT SHORT TERM GOAL #1   Title Patient will increase right knee passive flexion by 25 degrees    Baseline 10   Time 4   Period Weeks   Status Achieved     PT SHORT TERM GOAL #2   Title Patient will demsotrate full passive right knee extension   Baseline full range 08/19/2016   Time 4   Period Weeks   Status Achieved     PT SHORT TERM GOAL #3   Title Patient will increase gross right lower extremity strength to 4+/5    Baseline 5/5 today    Time 4   Period Weeks   Status Achieved     PT SHORT TERM GOAL #4   Title Patient will walk 400' without an assistive device with minimal antalgic gait    Baseline no longer using cane    Time 4   Period Weeks   Status Achieved     PT SHORT TERM GOAL #5   Title Patient wil be independent with inital HEP    Baseline perfroming intial exercises at home    Time 4   Period Weeks   Status Achieved           PT Long Term Goals - 08/26/16 6283      PT LONG TERM GOAL #1   Title Patient  will increase gorss knee flexion movement to 0-120 degrees without pain in order to get up and down off a low chair.    Baseline 0-117   Time 8   Period Weeks   Status On-going     PT LONG TERM GOAL #2   Title Patient will ambulate 3000' without increased pain and without an antalgic gait in order to return to work    Baseline no antalgic gait    Time 8   Period Weeks   Status Achieved     PT LONG TERM GOAL #3   Title Patient will be independent with final HEP for LE strength and stability    Time 8   Period Weeks   Status On-going     PT LONG TERM GOAL #4   Title Patient will demsotrate a 45% limitation on FOTO    Baseline 28%   Time 8   Period Weeks   Status Achieved     PT LONG TERM GOAL #5   Title Patient will go up/down 8 steps without increased pain in order to perfrom ADL's    Baseline ABLE TO GO DOWN [address] [facility] STEP TODAY    Time 8   Period Weeks   Status Achieved               Plan - 08/30/16 1047    Clinical Impression Statement Patient is making good progress. He was lacking some extension today. Therapy focused on his extension. He will return for 1 more visit to work on his extension then discharge.    Rehab Potential Good   PT Frequency 2x / week   PT Duration 8 weeks   PT Treatment/Interventions ADLs/Self Care Home Management;Cryotherapy;Electrical Stimulation;Gait training;Stair training;Ultrasound;Moist Heat;Iontophoresis 4mg /ml Dexamethasone;Functional mobility training;Therapeutic activities;Therapeutic exercise;Manual techniques;Patient/family education;Passive range of motion;Dry needling;Taping;Vasopneumatic Device;Neuromuscular re-education   PT Next Visit Plan continue with stretching and strengthening; recertification    PT Home Exercise Plan SAQ, LAQ, SLR , Quad set,    Consulted and Agree with Plan of Care Patient      Patient will benefit from skilled therapeutic intervention in order to improve the following deficits  and impairments:   Abnormal gait, Decreased range of motion, Difficulty walking, Increased muscle spasms, Decreased endurance, Decreased mobility, Decreased strength, Increased edema, Impaired sensation  Visit Diagnosis: Stiffness of right knee, not elsewhere classified  Acute pain of right knee  Difficulty in walking, not elsewhere classified  Muscle weakness (generalized)     Problem List Patient Active Problem List   Diagnosis Date Noted  . Unilateral primary osteoarthritis, right knee 06/01/2016  . S/P total knee replacement using cement, right 06/01/2016  . Sleep apnea 05/19/2016  . Hypertension 05/19/2016  . Abdominal hernia 05/19/2016    Carney Living PT DPT  08/30/2016, 3:39 PM  [facility] [address] [place], [place], [address] Phone: [phone]   Fax:  [phone]  Name: James Gallegos MRN: [id number] Date of Birth: Dec 21, 1950

## 2016-09-02 ENCOUNTER — Telehealth (INDEPENDENT_AMBULATORY_CARE_PROVIDER_SITE_OTHER): Payer: Self-pay | Admitting: Orthopaedic Surgery

## 2016-09-02 MED FILL — EPLERENONE 25 MG TABLET: 25 | 30 days supply | Qty: 30 | Fill #2

## 2016-09-02 NOTE — Telephone Encounter (Signed)
Patient says his insurance will not pay for the pennsaid so he is requesting an rx for voltaren pills be [facility].

## 2016-09-06 ENCOUNTER — Encounter: Payer: Self-pay | Admitting: Physical Therapy

## 2016-09-06 ENCOUNTER — Ambulatory Visit: Payer: 59 | Admitting: Physical Therapy

## 2016-09-06 DIAGNOSIS — M6281 Muscle weakness (generalized): Secondary | ICD-10-CM

## 2016-09-06 DIAGNOSIS — M25561 Pain in right knee: Secondary | ICD-10-CM

## 2016-09-06 DIAGNOSIS — R262 Difficulty in walking, not elsewhere classified: Secondary | ICD-10-CM

## 2016-09-06 DIAGNOSIS — M25661 Stiffness of right knee, not elsewhere classified: Secondary | ICD-10-CM

## 2016-09-06 NOTE — Therapy (Signed)
[place], [place], [address] Phone: [phone]   Fax:  [phone]  Physical Therapy Treatment/ Discharge   Patient Details  Name: James Gallegos MRN: [id number] Date of Birth: [birth year] Referring Provider: Dr [name]  Encounter Date: 09/06/2016      PT End of Session - 09/06/16 1001    Visit Number 22   Number of Visits 24   Date for PT Re-Evaluation 09/09/16   Authorization Type [facility] U[facility]    PT Start Time 0933   PT Stop Time 1011   PT Time Calculation (min) 38 min   Activity Tolerance Patient tolerated treatment well   Behavior During Therapy [facility] for tasks assessed/performed      Past Medical History:  Diagnosis Date  . Arthritis   . Deep vein thrombosis ([place])   . Hypertension   . Murmur, cardiac   . Sleep apnea    CPAP    Past Surgical History:  Procedure Laterality Date  . CERVICAL SPINE SURGERY N/A 1990  . FINGER SURGERY Right 2008   x 3 index finger  . KNEE ARTHROSCOPY Bilateral    L 2004, R 2005  . 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 TOTAL KNEE ARTHROPLASTY Right 06/01/2016   Procedure: TOTAL KNEE ARTHROPLASTY;  Surgeon: [name], MD;  Location: [place];  Service: Orthopedics;  Laterality: Right;    There were no vitals filed for this visit.      Subjective Assessment - 09/06/16 0937    Subjective (P)  Patient reports he is a little stiff but overall he is doing well.    Pertinent History (P)  DVT after past knee scope   Limitations (P)  Standing;Walking   How long can you sit comfortably? (P)  1/2 hour befor the  knee stiffens    How long can you stand comfortably? (P)  < 10 minutes    How long can you walk comfortably? (P)  < 300' w/o pain    Diagnostic tests (P)  Nothing post op    Patient Stated Goals (P)  To improve ability to walk and to go back to work                          [facility] Adult PT Treatment/Exercise - 09/06/16  0001      Knee/Hip Exercises: Stretches   Active Hamstring Stretch 3 reps;10 seconds     Knee/Hip Exercises: Aerobic   Nustep 5 min L5     Knee/Hip Exercises: Machines for Strengthening   Total Gym Leg Press 140 sled 7 3x10      Knee/Hip Exercises: Standing   Lateral Step Up Limitations 8"x20    Forward Step Up Limitations 8"x20   Step Down Limitations 8"2x10   Functional Squat Limitations x20    Other Standing Knee Exercises step onto air-ex.     Manual Therapy   Manual therapy comments PROM into flexion and extension. Grade II and III AP and PA mobilizations,,  4 way pateallar mobilizations. Focus on extension.                 PT Education - 09/06/16 1000    Education provided Yes   Education Details continue with stretching and strengthening    Person(s) Educated Patient   Methods Explanation   Comprehension Verbalized understanding          PT Short Term Goals - 09/06/16 1306  PT SHORT TERM GOAL #1   Title Patient will increase right knee passive flexion by 25 degrees    Baseline 10   Time 4   Period Weeks   Status Achieved     PT SHORT TERM GOAL #2   Title Patient will demsotrate full passive right knee extension   Baseline full range 08/19/2016   Time 4   Period Weeks   Status Achieved     PT SHORT TERM GOAL #3   Title Patient will increase gross right lower extremity strength to 4+/5    Baseline 5/5 today    Time 4   Period Weeks   Status Achieved     PT SHORT TERM GOAL #4   Title Patient will walk 400' without an assistive device with minimal antalgic gait    Baseline no longer using cane    Time 4   Period Weeks   Status On-going     PT SHORT TERM GOAL #5   Title Patient wil be independent with inital HEP    Baseline perfroming intial exercises at home    Time 4   Period Weeks   Status Achieved           PT Long Term Goals - 09/06/16 1307      PT LONG TERM GOAL #1   Title Patient will increase gorss knee flexion  movement to 0-120 degrees without pain in order to get up and down off a low chair.    Baseline 3-117   Time 8   Period Weeks   Status Achieved     PT LONG TERM GOAL #2   Title Patient will ambulate 3000' without increased pain and without an antalgic gait in order to return to work    Baseline no antalgic gait    Time 8   Period Weeks   Status Achieved     PT LONG TERM GOAL #3   Title Patient will be independent with final HEP for LE strength and stability    Baseline perfroming exercises at home    Time 8   Period Weeks   Status Achieved     PT LONG TERM GOAL #4   Title Patient will demsotrate a 45% limitation on FOTO    Baseline 28%   Time 8   Period Weeks   Status Achieved     PT LONG TERM GOAL #5   Title Patient will go up/down 8 steps without increased pain in order to perfrom ADL's    Baseline ABLE TO GO DOWN 8 [facility] STEP TODAY    Time 8   Period Weeks   Status Achieved               Plan - 09/06/16 1001    Clinical Impression Statement Extension has improved since last visit. The patient has met all goals for therapy. He was educated on the improtance of continued stretching. His final ROM measurements were measured at 3-117. He was advised to continue stretching for another 3-6 months.    Rehab Potential Good   PT Frequency 2x / week   PT Duration 8 weeks   PT Treatment/Interventions ADLs/Self Care Home Management;Cryotherapy;Electrical Stimulation;Gait training;Stair training;Ultrasound;Moist Heat;Iontophoresis '4mg'$ /ml Dexamethasone;Functional mobility training;Therapeutic activities;Therapeutic exercise;Manual techniques;Patient/family education;Passive range of motion;Dry needling;Taping;Vasopneumatic Device;Neuromuscular re-education   PT Next Visit Plan continue with stretching and strengthening; recertification       Patient will benefit from skilled therapeutic intervention in order to improve the following deficits and impairments:  Abnormal  gait,  Decreased range of motion, Difficulty walking, Increased muscle spasms, Decreased endurance, Decreased mobility, Decreased strength, Increased edema, Impaired sensation  Visit Diagnosis: Stiffness of right knee, not elsewhere classified  Acute pain of right knee  Difficulty in walking, not elsewhere classified  Muscle weakness (generalized)    PHYSICAL THERAPY DISCHARGE SUMMARY  Visits from Start of Care: 22  Current functional level related to goals / functional outcomes: Significant improvement in function. Back to work.    Remaining deficits: Minor aches and pains at times    Education / Equipment: HEP Plan: Patient agrees to discharge.  Patient goals were not met. Patient is being discharged due to meeting the stated rehab goals.  ?????      Problem List Patient Active Problem List   Diagnosis Date Noted  . Unilateral primary osteoarthritis, right knee 06/01/2016  . S/P total knee replacement using cement, right 06/01/2016  . Sleep apnea 05/19/2016  . Hypertension 05/19/2016  . Abdominal hernia 05/19/2016    Carney Living  PT DPT  09/06/2016, 1:10 PM  [facility] [address] [place], [place], [address] Phone: [phone]   Fax:  [phone]  Name: James Gallegos MRN: [id number] Date of Birth: 10-05-1950

## 2016-09-06 NOTE — Telephone Encounter (Signed)
OK 

## 2016-09-07 ENCOUNTER — Other Ambulatory Visit (INDEPENDENT_AMBULATORY_CARE_PROVIDER_SITE_OTHER): Payer: Self-pay

## 2016-09-07 MED ORDER — DICLOFENAC SODIUM 50 MG PO TBEC: 50.0000 mg | DELAYED_RELEASE_TABLET | Freq: Two times a day (BID) | ORAL | 1 refills | Status: DC

## 2016-09-07 MED FILL — DICLOFENAC SOD EC 50 MG TAB: 50 | 30 days supply | Qty: 60 | Fill #0

## 2016-09-07 NOTE — Telephone Encounter (Signed)
ok 

## 2016-09-23 ENCOUNTER — Ambulatory Visit (INDEPENDENT_AMBULATORY_CARE_PROVIDER_SITE_OTHER): Payer: 59 | Admitting: Internal Medicine

## 2016-09-23 ENCOUNTER — Encounter: Payer: Self-pay | Admitting: Internal Medicine

## 2016-09-23 VITALS — BP 156/69 | HR 66 | Temp 98.3°F | Ht 69.5 in | Wt 227.1 lb

## 2016-09-23 DIAGNOSIS — G4733 Obstructive sleep apnea (adult) (pediatric): Secondary | ICD-10-CM

## 2016-09-23 DIAGNOSIS — G4730 Sleep apnea, unspecified: Secondary | ICD-10-CM

## 2016-09-23 DIAGNOSIS — Z79899 Other long term (current) drug therapy: Secondary | ICD-10-CM

## 2016-09-23 DIAGNOSIS — I10 Essential (primary) hypertension: Secondary | ICD-10-CM | POA: Diagnosis not present

## 2016-09-23 DIAGNOSIS — Z9989 Dependence on other enabling machines and devices: Secondary | ICD-10-CM | POA: Diagnosis not present

## 2016-09-23 DIAGNOSIS — R7989 Other specified abnormal findings of blood chemistry: Secondary | ICD-10-CM | POA: Diagnosis not present

## 2016-09-23 DIAGNOSIS — Z96651 Presence of right artificial knee joint: Secondary | ICD-10-CM | POA: Diagnosis not present

## 2016-09-23 MED ORDER — EPLERENONE 25 MG PO TABS: 25.0000 mg | ORAL_TABLET | Freq: Every day | ORAL | 1 refills | Status: DC

## 2016-09-23 MED ORDER — LOSARTAN POTASSIUM-HCTZ 100-25 MG PO TABS: 1.0000 | ORAL_TABLET | Freq: Every day | ORAL | 2 refills | Status: DC

## 2016-09-23 MED FILL — LOSARTAN-HCTZ 100-25 MG TAB: 100-25 | 30 days supply | Qty: 30 | Fill #0

## 2016-09-23 NOTE — Patient Instructions (Signed)
[name],  I am going to increase the dose of your diuretic today. If you have problems getting this medication please let us know.  I would like you to stop the testosterone cream and we can re-check levels at next visit.  Please follow up with me in 2 weeks for BP check.

## 2016-09-23 NOTE — Progress Notes (Signed)
   CC: Establish Care for HTN  HPI:  Mr.Alyssa H Willert is a 66 y.o. male with a past medical history listed below here today to establish care for his Hypertension.  For details of today's visit and the status of his chronic medical issues please refer to the assessment and plan.  Past Medical History:  Diagnosis Date  . Arthritis   . Deep vein thrombosis ([place])    after knee surgery in 2004  . Hypertension   . Murmur, cardiac   . Sleep apnea    CPAP   Past Surgical History:  Procedure Laterality Date  . CERVICAL SPINE SURGERY N/A 1990   C5-6 discectomy  . FINGER SURGERY Right 2008   x 3 index finger  . KNEE ARTHROSCOPY Bilateral    L 2004, R 2005  . 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 TOTAL KNEE ARTHROPLASTY Right 06/01/2016   Procedure: TOTAL KNEE ARTHROPLASTY;  Surgeon: [name], MD;  Location: [place];  Service: Orthopedics;  Laterality: Right;   Family History  Problem Relation Age of Onset  . Alzheimer's disease Father    Social History   Social History  . Marital status: Married    Spouse name: N/A  . Number of children: N/A  . Years of education: N/A   Social History Main Topics  . Smoking status: Never Smoker  . Smokeless tobacco: Never Used  . Alcohol use Yes     Comment: rarely  . Drug use: No  . Sexual activity: Not Asked   Other Topics Concern  . None   Social History Narrative  . None   Review of Systems:   Review of Systems  Constitutional: Negative for chills, fever, malaise/fatigue and weight loss.  HENT: Negative.   Eyes: Negative.   Respiratory: Negative for cough and wheezing.   Cardiovascular: Negative for chest pain and palpitations.  Gastrointestinal: Negative for abdominal pain, heartburn, nausea and vomiting.  Genitourinary: Negative.   Musculoskeletal: Negative.   Skin: Negative for rash.  Neurological: Negative.   Endo/Heme/Allergies: Negative.     Physical Exam:  Vitals:   09/23/16 0917 09/23/16  0919  BP: (!) 161/64 (!) 156/69  Pulse: 68 66  Temp: 98.3 F (36.8 C)   TempSrc: Oral   SpO2: 99%   Weight: 227 lb 1.6 oz (103 kg)   Height: 5' 9.5" (1.765 m)    Physical Exam  Constitutional: He is oriented to person, place, and time and well-developed, well-nourished, and in no distress.  HENT:  Head: Normocephalic and atraumatic.  Eyes: Pupils are equal, round, and reactive to light.  Neck: No thyromegaly present.  Cardiovascular: Normal rate, regular rhythm and normal heart sounds.   Pulmonary/Chest: Effort normal and breath sounds normal. He has no wheezes. He has no rales.  Abdominal: Soft. Bowel sounds are normal. He exhibits no distension. There is no tenderness.  Neurological: He is alert and oriented to person, place, and time.  Skin: Skin is warm and dry.  Psychiatric: Mood and affect normal.  Vitals reviewed.   Assessment & Plan:   See Encounters Tab for problem based charting.  Patient discussed with Dr. Dareen Piano

## 2016-09-26 DIAGNOSIS — Z0000 Encounter for general adult medical examination without abnormal findings: Secondary | ICD-10-CM | POA: Insufficient documentation

## 2016-09-26 DIAGNOSIS — R7989 Other specified abnormal findings of blood chemistry: Secondary | ICD-10-CM | POA: Insufficient documentation

## 2016-09-26 NOTE — Assessment & Plan Note (Signed)
Underwent recent surgery for total knee replacement of his right knee. Reports he is recovering well. Using his exercise bike twice a day. No complaints.

## 2016-09-26 NOTE — Assessment & Plan Note (Signed)
BP Readings from Last 3 Encounters:  09/23/16 (!) 156/69  08/27/16 (!) 158/64  08/11/16 (!) 160/78    Lab Results  Component Value Date   NA 135 06/03/2016   K 3.7 06/03/2016   CREATININE 1.18 06/03/2016   Mr. Robarts presents today to establish care for his HTN. Reports he was recently seen by Dr. Nadyne Coombes for cardiac clearance prior to knee replacement surgery. Says he had stress test done which was negative. However, had elevated blood pressure and was started on additional medications. States he has been on telmisartan-hctz 80-12.5 mg daily and eplerenone 25 mg daily. Reports following up with Dr. Nadyne Coombes and having blood work done with no abnormalities. However, BP remained elevated and was told to establish with a PCP for further BP management. Denies any symptoms today.   Assessment: HTN  Plan: D/C telmisartan-hctz Start Losartan-hctz 100-25 mg daily Continue eplerenone 25 mg dialy RTC in 2 weeks for follow up, labs Obtain records from previous PCP and Dr. Nadyne Coombes

## 2016-09-26 NOTE — Assessment & Plan Note (Signed)
Reports his previous PCP checked his testosterone levels and told him it was low. Has been on testosterone gel 12.5 mg for the past 3 years. Reports not seeing any difference sine being on the medication.   Plan D/c testosterone cream today. Recheck levels at follow up.

## 2016-09-26 NOTE — Assessment & Plan Note (Signed)
Reports using CPAP nightly. No complaints.

## 2016-09-28 NOTE — Progress Notes (Signed)
Internal Medicine Clinic Attending  Case discussed with Dr. Boswell at the time of the visit.  We reviewed the resident's history and exam and pertinent patient test results.  I agree with the assessment, diagnosis, and plan of care documented in the resident's note.  

## 2016-10-04 ENCOUNTER — Ambulatory Visit (INDEPENDENT_AMBULATORY_CARE_PROVIDER_SITE_OTHER): Payer: 59 | Admitting: *Deleted

## 2016-10-04 ENCOUNTER — Telehealth: Payer: Self-pay | Admitting: *Deleted

## 2016-10-04 DIAGNOSIS — Z23 Encounter for immunization: Secondary | ICD-10-CM

## 2016-10-04 NOTE — Telephone Encounter (Signed)
No contraindications listed in PMHx, PL, or med list. OK for Tdap.

## 2016-10-04 NOTE — Telephone Encounter (Signed)
Pt called to state that he stepped on a nail.  He's is requesting a tetanus shot.  He has an appt with his pcp on 04/26, but is currently in town and wants to get vaccine today.  Discussed with the attending, ok to give tdap as long as there are not any contraindications.Regenia Skeeter, Darlene Cassady4/23/20189:10 AM

## 2016-10-04 NOTE — Telephone Encounter (Signed)
Correction: per chart review, pt has appt in [facility] on 04/26, not with pcp.[name]4/23/20189:50 AM

## 2016-10-07 ENCOUNTER — Ambulatory Visit (INDEPENDENT_AMBULATORY_CARE_PROVIDER_SITE_OTHER): Payer: 59 | Admitting: Internal Medicine

## 2016-10-07 ENCOUNTER — Encounter: Payer: Self-pay | Admitting: Internal Medicine

## 2016-10-07 VITALS — BP 134/61 | HR 61 | Temp 98.2°F | Ht 69.5 in | Wt 229.2 lb

## 2016-10-07 DIAGNOSIS — Z23 Encounter for immunization: Secondary | ICD-10-CM | POA: Diagnosis not present

## 2016-10-07 DIAGNOSIS — I10 Essential (primary) hypertension: Secondary | ICD-10-CM | POA: Diagnosis not present

## 2016-10-07 DIAGNOSIS — R7989 Other specified abnormal findings of blood chemistry: Secondary | ICD-10-CM

## 2016-10-07 DIAGNOSIS — Z79899 Other long term (current) drug therapy: Secondary | ICD-10-CM | POA: Diagnosis not present

## 2016-10-07 DIAGNOSIS — E781 Pure hyperglyceridemia: Secondary | ICD-10-CM

## 2016-10-07 DIAGNOSIS — Z0000 Encounter for general adult medical examination without abnormal findings: Secondary | ICD-10-CM

## 2016-10-07 MED ORDER — DICLOFENAC SODIUM 1 % TD GEL: 2.0000 g | Freq: Four times a day (QID) | TRANSDERMAL | 2 refills | Status: DC

## 2016-10-07 MED FILL — EPLERENONE 25 MG TABLET: 25 | 90 days supply | Qty: 90 | Fill #0

## 2016-10-07 MED FILL — DICLOFENAC SODIUM 1% GEL: 1 | 10 days supply | Qty: 100 | Fill #0

## 2016-10-07 NOTE — Patient Instructions (Addendum)
James Gallegos,  Your blood pressure is looking better today. I do not think we need to make any changes today.  We will get your colonoscopy arranged.  Please follow up with Dr. Dareen Piano in about 6 months.

## 2016-10-07 NOTE — Assessment & Plan Note (Signed)
BP Readings from Last 3 Encounters:  10/07/16 134/61  09/23/16 (!) 156/69  08/27/16 (!) 158/64    Lab Results  Component Value Date   NA 135 06/03/2016   K 3.7 06/03/2016   CREATININE 1.18 06/03/2016   Currently taking Losartan-HCTZ 100-25 and Eplerenone 25 mg daily.   BP initially elevated at 156/69 on first check but improved to 134/61 on re-check. Reports when he checks while at home he is consistently in the 768G systolic. He denies any side effects from the medications. Denies any headaches, vision changes, dizziness/lightheadedness, chest pain, SOB.  Assessment: HTN, improved  Plan: Continue current medications  Will check BMET today

## 2016-10-07 NOTE — Progress Notes (Signed)
   CC: HTN follow up  HPI:  [name] is a 66 y.o. male with a past medical history listed below here today for follow up of his HTN.   For details of today's visit and the status of his chronic medical issues please refer to the assessment and plan.   Past Medical History:  Diagnosis Date  . Arthritis   . Deep vein thrombosis ([place])    after knee surgery in 2004  . Hypertension   . Murmur, cardiac   . Sleep apnea    CPAP    Review of Systems:   Review of Systems  Eyes: Negative for blurred vision and double vision.  Respiratory: Negative for shortness of breath.   Cardiovascular: Negative for chest pain.  Neurological: Negative for dizziness and headaches.   Physical Exam:  Vitals:   10/07/16 0912 10/07/16 0923  BP: (!) 153/62 134/61  Pulse: 60 61  Temp: 98.2 F (36.8 C)   TempSrc: Oral   SpO2: 98%   Weight: 229 lb 3.2 oz (104 kg)   Height: 5' 9.5" (1.765 m)    Physical Exam  Constitutional: He is oriented to person, place, and time and well-developed, well-nourished, and in no distress. No distress.  Cardiovascular: Normal rate, regular rhythm and normal heart sounds.   Pulmonary/Chest: Effort normal and breath sounds normal.  Abdominal: Soft. Bowel sounds are normal. He exhibits no distension. There is no tenderness.  Neurological: He is alert and oriented to person, place, and time.  Skin: Skin is warm and dry.  Vitals reviewed.  Assessment & Plan:   See Encounters Tab for problem based charting.  Patient discussed with Dr. Evette Gallegos

## 2016-10-07 NOTE — Assessment & Plan Note (Addendum)
Reports he has never had a colonoscopy before. Discussed options of colonoscopy vs FIT screening and he opted for colonoscopy.   Will refer to GI for colonoscopy Checking Hep C status today Received Prevnar 13 today (never been vaccinated before); PPSV23 in 6-12 months.  Checking Lipid panel today - none in our records; not on any statin therapy.

## 2016-10-07 NOTE — Assessment & Plan Note (Signed)
Reports he has felt like he has had less energy since stopping the testosterone cream. Denies any sexual dysfunction. Declined GU exam today. After discussion he agreed to stopping the testosterone cream unless there is reason to re-check testosterone levels in the future.

## 2016-10-08 LAB — CMP14 + ANION GAP
ALK PHOS: 56 IU/L (ref 39–117)
ALT: 28 IU/L (ref 0–44)
AST: 19 IU/L (ref 0–40)
Albumin/Globulin Ratio: 1.6 (ref 1.2–2.2)
Albumin: 4.4 g/dL (ref 3.6–4.8)
Anion Gap: 19 mmol/L — ABNORMAL HIGH (ref 10.0–18.0)
BUN/Creatinine Ratio: 19 (ref 10–24)
BUN: 21 mg/dL (ref 8–27)
Bilirubin Total: 0.4 mg/dL (ref 0.0–1.2)
CALCIUM: 9.5 mg/dL (ref 8.6–10.2)
CO2: 23 mmol/L (ref 18–29)
CREATININE: 1.09 mg/dL (ref 0.76–1.27)
Chloride: 98 mmol/L (ref 96–106)
GFR calc Af Amer: 81 mL/min/{1.73_m2} (ref >59)
GFR calc non Af Amer: 70 mL/min/{1.73_m2} (ref >59)
GLOBULIN, TOTAL: 2.8 g/dL (ref 1.5–4.5)
GLUCOSE: 121 mg/dL — AB (ref 65–99)
POTASSIUM: 4.6 mmol/L (ref 3.5–5.2)
SODIUM: 140 mmol/L (ref 134–144)
Total Protein: 7.2 g/dL (ref 6.0–8.5)

## 2016-10-08 LAB — LIPID PANEL
CHOL/HDL RATIO: 6.7 ratio — AB (ref 0.0–5.0)
Cholesterol, Total: 194 mg/dL (ref 100–199)
HDL: 29 mg/dL — ABNORMAL LOW (ref >39)
Triglycerides: 511 mg/dL — ABNORMAL HIGH (ref 0–149)

## 2016-10-08 LAB — HEPATITIS C ANTIBODY

## 2016-10-08 NOTE — Progress Notes (Signed)
Internal Medicine Clinic Attending  Case discussed with Dr. Charlynn Grimes at the time of the visit.  We reviewed the resident's history and exam and pertinent patient test results.  I agree with the assessment, diagnosis, and plan of care documented in the resident's note.  Lipid profile shows moderate hypertriglyceridemia, though I am not sure if this was fasting. 10 year ASCVD risk is 23% and he would qualify for primary prevention strategies. I will forward this to his PCP for consideration. Triglicerides can be repeated in the fasting state, he would benefit from the usual healthy lifestyle interventions, current level is not high enough to risk pancreatitis.

## 2016-10-19 MED FILL — LOSARTAN-HCTZ 100-25 MG TAB: 100-25 | 30 days supply | Qty: 30 | Fill #1

## 2016-11-01 MED FILL — AMOXICILLIN 500 MG CAPSULE: 500 | 3 days supply | Qty: 12 | Fill #1

## 2016-11-05 MED FILL — AMOXICILLIN 500 MG CAPSULE: 500 | 7 days supply | Qty: 21 | Fill #0

## 2016-11-19 MED FILL — LOSARTAN-HCTZ 100-25 MG TAB: 100-25 | 30 days supply | Qty: 30 | Fill #2

## 2016-11-25 ENCOUNTER — Ambulatory Visit (AMBULATORY_SURGERY_CENTER): Payer: 59

## 2016-11-25 ENCOUNTER — Encounter: Payer: Self-pay | Admitting: Internal Medicine

## 2016-11-25 VITALS — Ht 70.0 in | Wt 229.2 lb

## 2016-11-25 DIAGNOSIS — Z1211 Encounter for screening for malignant neoplasm of colon: Secondary | ICD-10-CM

## 2016-11-25 MED ORDER — NA SULFATE-K SULFATE-MG SULF 17.5-3.13-1.6 GM/177ML PO SOLN: 1.0000 | Freq: Once | ORAL | 0 refills | Status: AC

## 2016-11-25 MED FILL — SUPREP BOWEL PREP KIT: 17.5-3.13-1 | 1 days supply | Qty: 354 | Fill #0

## 2016-11-25 NOTE — Progress Notes (Signed)
Denies allergies to eggs or soy products. Denies complication of anesthesia or sedation. Denies use of weight loss medication. Denies use of O2.   Emmi instructions given for colonoscopy.  

## 2016-11-26 ENCOUNTER — Ambulatory Visit (INDEPENDENT_AMBULATORY_CARE_PROVIDER_SITE_OTHER): Payer: 59 | Admitting: Orthopaedic Surgery

## 2016-12-06 ENCOUNTER — Encounter (INDEPENDENT_AMBULATORY_CARE_PROVIDER_SITE_OTHER): Payer: Self-pay | Admitting: Orthopaedic Surgery

## 2016-12-06 ENCOUNTER — Ambulatory Visit (INDEPENDENT_AMBULATORY_CARE_PROVIDER_SITE_OTHER): Payer: 59 | Admitting: Orthopaedic Surgery

## 2016-12-06 VITALS — BP 149/62 | HR 66 | Resp 14 | Ht 68.0 in | Wt 225.0 lb

## 2016-12-06 DIAGNOSIS — Z96651 Presence of right artificial knee joint: Secondary | ICD-10-CM

## 2016-12-06 NOTE — Progress Notes (Signed)
Office Visit Note   Patient: James Gallegos           Date of Birth: [birth year]           MRN: [id number] Visit Date: 12/06/2016              Requested by: Aldine Contes, MD [address] [place], [place] [address] PCP: Aldine Contes, MD   Assessment & Plan: Visit Diagnoses:  1. History of total right knee replacement   6 months status post total knee replacement generally doing well. Still has some aches and pains  Plan: Encourage exercises taken for strengthening of quads and hamstrings. Office 6 months. 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   Orders:  No orders of the defined types were placed in this encounter.  No orders of the defined types were placed in this encounter.     Procedures: No procedures performed   Clinical Data: No additional findings.   Subjective: Chief Complaint  Patient presents with  . Right Knee - Routine Post Op    [name] is a 66 y o that is 6 months status post R TKA. He relates pain wakes him up at night and completed PT. His ROM is good but sometimes he has stiffness  Generally doing well. Still has some stiffness with weather changes. Occasional ache or pain but certainly better than he was before surgery he denies any fever or chills.. 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 I'm going to refer him to [facility] for consideration of A formal orthotic  HPI  Review of Systems   Objective: Vital Signs: BP (!) 149/62   Pulse 66   Resp 14   Ht 5\' 8"  (1.727 m)   Wt 225 lb (102.1 kg)   BMI 34.21 kg/m   Physical Exam  Ortho Exam right knee with a well-healed heel incision. Incision is no longer sensitive. Minimal effusion. Knee was  not hot warm or red. No instability with a varus or valgus stress. Negative anterior drawer sign. Full quick extension and good quad strength. Flexed artery and 3 today by goniometer but early in the morning. Skin otherwise intact.  Specialty Comments:  No specialty comments available.  Imaging: No results found.   PMFS History: Patient Active Problem List   Diagnosis Date Noted  . Low testosterone 09/26/2016  . Healthcare maintenance 09/26/2016  . S/P total knee replacement using cement, right 06/01/2016  . Sleep apnea 05/19/2016  . Hypertension 05/19/2016   Past Medical History:  Diagnosis Date  . Arthritis   . Deep vein thrombosis ([place])    after knee surgery in 2004  . GERD (gastroesophageal reflux disease)   . Hypertension   . Murmur, cardiac   . Sleep apnea    CPAP    Family History  Problem Relation Age of Onset  . Alzheimer's disease Father   . Colon cancer Neg Hx   . Esophageal cancer Neg Hx   . Rectal cancer Neg Hx   . Stomach cancer Neg Hx     Past Surgical History:  Procedure Laterality Date  . CERVICAL SPINE SURGERY N/A 1990   C5-6 discectomy  . FINGER SURGERY Right 2008   x  3 index finger  . KNEE ARTHROSCOPY Bilateral    L 2004, R 2005  . 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 TOTAL KNEE ARTHROPLASTY Right 06/01/2016   Procedure: TOTAL KNEE ARTHROPLASTY;  Surgeon: [name], MD;  Location: [place];  Service: Orthopedics;  Laterality: Right;   Social History   Occupational History  . Not on file.   Social History Main Topics  . Smoking status: Never Smoker  . Smokeless tobacco: Never Used  . Alcohol use Yes     Comment: rarely  . Drug use: No  . Sexual activity: Not on file     [name], MD   Note - This record has been created using [employer].  Chart creation errors have been sought, but may not always  have been located. Such creation errors do not reflect on  the standard of medical care.

## 2016-12-08 DIAGNOSIS — G4733 Obstructive sleep apnea (adult) (pediatric): Secondary | ICD-10-CM | POA: Diagnosis not present

## 2016-12-09 ENCOUNTER — Encounter: Payer: Self-pay | Admitting: Internal Medicine

## 2016-12-09 ENCOUNTER — Ambulatory Visit (AMBULATORY_SURGERY_CENTER): Payer: 59 | Admitting: Internal Medicine

## 2016-12-09 VITALS — BP 142/59 | HR 66 | Temp 97.3°F | Resp 11 | Ht 70.0 in | Wt 229.0 lb

## 2016-12-09 DIAGNOSIS — D124 Benign neoplasm of descending colon: Secondary | ICD-10-CM | POA: Diagnosis not present

## 2016-12-09 DIAGNOSIS — K219 Gastro-esophageal reflux disease without esophagitis: Secondary | ICD-10-CM | POA: Diagnosis not present

## 2016-12-09 DIAGNOSIS — I10 Essential (primary) hypertension: Secondary | ICD-10-CM | POA: Diagnosis not present

## 2016-12-09 DIAGNOSIS — D123 Benign neoplasm of transverse colon: Secondary | ICD-10-CM

## 2016-12-09 DIAGNOSIS — Z1212 Encounter for screening for malignant neoplasm of rectum: Secondary | ICD-10-CM

## 2016-12-09 DIAGNOSIS — Z1211 Encounter for screening for malignant neoplasm of colon: Secondary | ICD-10-CM

## 2016-12-09 DIAGNOSIS — D122 Benign neoplasm of ascending colon: Secondary | ICD-10-CM | POA: Diagnosis not present

## 2016-12-09 MED ORDER — SODIUM CHLORIDE 0.9 % IV SOLN: 500.0000 mL | INTRAVENOUS | Status: DC

## 2016-12-09 MED FILL — DICLOFENAC SOD EC 50 MG TAB: 50 | 30 days supply | Qty: 60 | Fill #1

## 2016-12-09 NOTE — Progress Notes (Signed)
No problems noted in the recovery room. maw 

## 2016-12-09 NOTE — Patient Instructions (Signed)
YOU HAD AN ENDOSCOPIC PROCEDURE TODAY AT THE  ENDOSCOPY CENTER:   Refer to the procedure report that was given to you for any specific questions about what was found during the examination.  If the procedure report does not answer your questions, please call your gastroenterologist to clarify.  If you requested that your care partner not be given the details of your procedure findings, then the procedure report has been included in a sealed envelope for you to review at your convenience later.  YOU SHOULD EXPECT: Some feelings of bloating in the abdomen. Passage of more gas than usual.  Walking can help get rid of the air that was put into your GI tract during the procedure and reduce the bloating. If you had a lower endoscopy (such as a colonoscopy or flexible sigmoidoscopy) you may notice spotting of blood in your stool or on the toilet paper. If you underwent a bowel prep for your procedure, you may not have a normal bowel movement for a few days.  Please Note:  You might notice some irritation and congestion in your nose or some drainage.  This is from the oxygen used during your procedure.  There is no need for concern and it should clear up in a day or so.  SYMPTOMS TO REPORT IMMEDIATELY:   Following lower endoscopy (colonoscopy or flexible sigmoidoscopy):  Excessive amounts of blood in the stool  Significant tenderness or worsening of abdominal pains  Swelling of the abdomen that is new, acute  Fever of 100F or higher  For urgent or emergent issues, a gastroenterologist can be reached at any hour by calling [phone].   DIET:  We do recommend a small meal at first, but then you may proceed to your regular diet.  Drink plenty of fluids but you should avoid alcoholic beverages for 24 hours.  ACTIVITY:  You should plan to take it easy for the rest of today and you should NOT DRIVE or use heavy machinery until tomorrow (because of the sedation medicines used during the test).     FOLLOW UP: Our staff will call the number listed on your records the next business day following your procedure to check on you and address any questions or concerns that you may have regarding the information given to you following your procedure. If we do not reach you, we will leave a message.  However, if you are feeling well and you are not experiencing any problems, there is no need to return our call.  We will assume that you have returned to your regular daily activities without incident.  If any biopsies were taken you will be contacted by phone or by letter within the next 1-3 weeks.  Please call us at [phone] if you have not heard about the biopsies in 3 weeks.    SIGNATURES/CONFIDENTIALITY: You and/or your care partner have signed paperwork which will be entered into your electronic medical record.  These signatures attest to the fact that that the information above on your After Visit Summary has been reviewed and is understood.  Full responsibility of the confidentiality of this discharge information lies with you and/or your care-partner.    Handouts were given to your care partner on polyps, diverticulosis, hemorrhoids and a high fiber diet with liberal fluid intake. You may resume your current medications today. Await biopsy results. Please call if any questions or concerns.   

## 2016-12-09 NOTE — Progress Notes (Signed)
Called to room to assist during endoscopic procedure.  Patient ID and intended procedure confirmed with present staff. Received instructions for my participation in the procedure from the performing physician.  

## 2016-12-09 NOTE — Op Note (Signed)
James Gallegos: James Gallegos Procedure Date: 12/09/2016 9:28 AM MRN: [id number] Endoscopist: Jerene Bears , MD Age: 66 Referring MD:  Date of Birth: December 03, 1950 Gender: Male Account #: [id number] Procedure:                Colonoscopy Indications:              Screening for colorectal malignant neoplasm, This                            is the patient's first colonoscopy Medicines:                Monitored Anesthesia Care Procedure:                Pre-Anesthesia Assessment:                           - Prior to the procedure, a History and Physical                            was performed, and patient medications and                            allergies were reviewed. The patient's tolerance of                            previous anesthesia was also reviewed. The risks                            and benefits of the procedure and the sedation                            options and risks were discussed with the patient.                            All questions were answered, and informed consent                            was obtained. Prior Anticoagulants: The patient has                            taken no previous anticoagulant or antiplatelet                            agents. ASA Grade Assessment: II - A patient with                            mild systemic disease. After reviewing the risks                            and benefits, the patient was deemed in                            satisfactory condition to undergo the procedure.  After obtaining informed consent, the colonoscope                            was passed under direct vision. Throughout the                            procedure, the patient's blood pressure, pulse, and                            oxygen saturations were monitored continuously. The                            Colonoscope was introduced through the anus and                            advanced to the the cecum,  identified by                            appendiceal orifice and ileocecal valve. The                            colonoscopy was performed without difficulty. The                            patient tolerated the procedure well. The quality                            of the bowel preparation was good. The ileocecal                            valve, appendiceal orifice, and rectum were                            photographed. Scope In: 9:36:53 AM Scope Out: 10:04:35 AM Scope Withdrawal Time: 0 hours 22 minutes 38 seconds  Total Procedure Duration: 0 hours 27 minutes 42 seconds  Findings:                 The digital rectal exam was normal.                           A 2 mm polyp was found in the ascending colon. The                            polyp was sessile. The polyp was removed with a                            cold biopsy forceps. Resection and retrieval were                            complete.                           Four sessile polyps were found in the ascending  colon. The polyps were 4 to 7 mm in size. These                            polyps were removed with a cold snare. Resection                            and retrieval were complete.                           Two sessile polyps were found in the transverse                            colon. The polyps were 4 to 6 mm in size. These                            polyps were removed with a cold snare. Resection                            and retrieval were complete.                           Two sessile polyps were found in the descending                            colon. The polyps were 4 to 5 mm in size. These                            polyps were removed with a cold snare. Resection                            and retrieval were complete.                           Multiple small and large-mouthed diverticula were                            found in the sigmoid colon and descending colon.                            Internal hemorrhoids were found during                            retroflexion. The hemorrhoids were small. Complications:            No immediate complications. Estimated Blood Loss:     Estimated blood loss was minimal. Impression:               - One 2 mm polyp in the ascending colon, removed                            with a cold biopsy forceps. Resected and retrieved.                           - Four 4  to 7 mm polyps in the ascending colon,                            removed with a cold snare. Resected and retrieved.                           - Two 4 to 6 mm polyps in the transverse colon,                            removed with a cold snare. Resected and retrieved.                           - Two 4 to 5 mm polyps in the descending colon,                            removed with a cold snare. Resected and retrieved.                           - Moderate diverticulosis in the sigmoid colon and                            in the descending colon.                           - Internal hemorrhoids. Recommendation:           - Patient has a contact number available for                            emergencies. The signs and symptoms of potential                            delayed complications were discussed with the                            patient. Return to normal activities tomorrow.                            Written discharge instructions were provided to the                            patient.                           - Resume previous diet.                           - Continue present medications.                           - Await pathology results.                           - Repeat colonoscopy is recommended. The  colonoscopy date will be determined after pathology                            results from today's exam become available for                            review. Jerene Bears, MD 12/09/2016 10:08:28 AM This report has been signed  electronically.

## 2016-12-09 NOTE — Progress Notes (Signed)
Report given to PACU, vss 

## 2016-12-10 ENCOUNTER — Telehealth: Payer: Self-pay | Admitting: *Deleted

## 2016-12-10 NOTE — Telephone Encounter (Signed)
  Follow up Call-  Call back number 12/09/2016  Post procedure Call Back phone  # [phone]  Permission to leave phone message Yes  Some recent data might be hidden     Patient questions:  Do you have a fever, pain , or abdominal swelling? No. Pain Score  0 *  Have you tolerated food without any problems? Yes.    Have you been able to return to your normal activities? Yes.    Do you have any questions about your discharge instructions: Diet   No. Medications  No. Follow up visit  No.  Do you have questions or concerns about your Care? No.  Actions: * If pain score is 4 or above: No action needed, pain <4.

## 2016-12-17 ENCOUNTER — Encounter: Payer: Self-pay | Admitting: Internal Medicine

## 2016-12-20 ENCOUNTER — Other Ambulatory Visit: Payer: Self-pay | Admitting: Internal Medicine

## 2016-12-21 MED FILL — LOSARTAN-HCTZ 100-25 MG TAB: 100-25 | 90 days supply | Qty: 90 | Fill #0

## 2016-12-28 DIAGNOSIS — M19072 Primary osteoarthritis, left ankle and foot: Secondary | ICD-10-CM | POA: Diagnosis not present

## 2016-12-28 DIAGNOSIS — M19071 Primary osteoarthritis, right ankle and foot: Secondary | ICD-10-CM | POA: Diagnosis not present

## 2016-12-31 MED FILL — AMOXICILLIN 500 MG CAPSULE: 500 | 7 days supply | Qty: 21 | Fill #1

## 2017-01-06 MED FILL — EPLERENONE 25 MG TABLET: 25 | 90 days supply | Qty: 90 | Fill #1

## 2017-01-10 DIAGNOSIS — L723 Sebaceous cyst: Secondary | ICD-10-CM | POA: Diagnosis not present

## 2017-01-14 ENCOUNTER — Ambulatory Visit: Payer: Self-pay | Admitting: Otolaryngology

## 2017-01-14 NOTE — H&P (Signed)
PREOPERATIVE H&P  Chief Complaint: Enlarged nodule right neck  HPI: James Gallegos is a 66 y.o. male who presents for evaluation of enlarged nodule in his right neck that he noticed about 2 weeks ago. It is not painful. On exam he has a 1-1/2-2 cm right lower neck subcutaneous nodule. No other significant adenopathy noted.  Past Medical History:  Diagnosis Date  . Arthritis   . Deep vein thrombosis ([place])    after knee surgery in 2004  . GERD (gastroesophageal reflux disease)   . Hypertension   . Murmur, cardiac   . Sleep apnea    CPAP   Past Surgical History:  Procedure Laterality Date  . CERVICAL SPINE SURGERY N/A 1990   C5-6 discectomy  . FINGER SURGERY Right 2008   x 3 index finger  . KNEE ARTHROSCOPY Bilateral    L 2004, R 2005  . 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 TOTAL KNEE ARTHROPLASTY Right 06/01/2016   Procedure: TOTAL KNEE ARTHROPLASTY;  Surgeon: [name], MD;  Location: [facility];  Service: Orthopedics;  Laterality: Right;   Social History   Social History  . Marital status: Married    Spouse name: N/A  . Number of children: N/A  . Years of education: N/A   Social History Main Topics  . Smoking status: Never Smoker  . Smokeless tobacco: Never Used  . Alcohol use Yes     Comment: rarely  . Drug use: No  . Sexual activity: Not on file   Other Topics Concern  . Not on file   Social History Narrative  . No narrative on file   Family History  Problem Relation Age of Onset  . Alzheimer's disease Father   . Colon cancer Neg Hx   . Esophageal cancer Neg Hx   . Rectal cancer Neg Hx   . Stomach cancer Neg Hx    No Known Allergies Prior to Admission medications   Medication Sig Start Date End Date Taking? Authorizing Provider  acetaminophen (TYLENOL) 500 MG tablet Take 2 tablets (1,000 mg total) by mouth every 6 (six) hours. 06/03/16  Yes Petrarca, Mike Craze, PA-C  Ascorbic Acid (VITAMIN C) 1000 MG tablet Take 5,000 mg by mouth daily.    Yes [provider]  B Complex-C (B-COMPLEX WITH VITAMIN C) tablet Take 1 tablet by mouth daily.   Yes [provider]  Cholecalciferol (VITAMIN D-3) 5000 units TABS Take 5,000 Units by mouth daily.   Yes [provider]  Cyanocobalamin (VITAMIN B-12) 5000 MCG TBDP Take 5,000 mcg by mouth daily.   Yes [provider]  eplerenone (INSPRA) 25 MG tablet Take 1 tablet (25 mg total) by mouth daily. 09/23/16  Yes Maryellen Pile, MD  fluticasone (FLONASE) 50 MCG/ACT nasal spray Place 2 sprays into the nose daily. Patient taking differently: Place 1-2 sprays into the nose at bedtime as needed (for allergies/nasal symptoms).  10/13/11 05/19/17 Yes Angus Palms., MD  losartan-hydrochlorothiazide (HYZAAR) 100-25 MG tablet TAKE 1 TABLET BY MOUTH DAILY. 12/21/16  Yes Aldine Contes, MD     Positive ROS: Negative for sore throat or hoarseness.  All other systems have been reviewed and were otherwise negative with the exception of those mentioned in the HPI and as above.  Physical Exam: There were no vitals filed for this visit.  General: Alert, no acute distress Oral: Normal oral mucosa and tonsils Nasal: Clear nasal passages Neck: Firm right lower neck 1-2 cm subcutaneous nodule. Possible cyst. Ear:  Ear canal is clear with normal appearing TMs Cardiovascular: Regular rate and rhythm, no murmur.  Respiratory: Clear to auscultation Neurologic: Alert and oriented x 3   Assessment/Plan: SEBACEOUS CYST Plan for Procedure(s): EXCISION BENIGN RIGHT NECK MASS   Melony Overly, MD 01/14/2017 5:40 PM

## 2017-01-18 ENCOUNTER — Encounter (HOSPITAL_BASED_OUTPATIENT_CLINIC_OR_DEPARTMENT_OTHER): Payer: Self-pay | Admitting: Anesthesiology

## 2017-01-18 ENCOUNTER — Ambulatory Visit (HOSPITAL_BASED_OUTPATIENT_CLINIC_OR_DEPARTMENT_OTHER)
Admission: RE | Admit: 2017-01-18 | Discharge: 2017-01-18 | Disposition: A | Discharge status: Home / Self Care | Payer: 59 | Source: Ambulatory Visit | Attending: Otolaryngology | Admitting: Otolaryngology

## 2017-01-18 ENCOUNTER — Encounter (HOSPITAL_BASED_OUTPATIENT_CLINIC_OR_DEPARTMENT_OTHER): Payer: Self-pay | Admitting: *Deleted

## 2017-01-18 ENCOUNTER — Encounter (HOSPITAL_BASED_OUTPATIENT_CLINIC_OR_DEPARTMENT_OTHER)
Admission: RE | Disposition: A | Discharge status: Home / Self Care | Payer: Self-pay | Source: Ambulatory Visit | Attending: Otolaryngology

## 2017-01-18 DIAGNOSIS — Z96651 Presence of right artificial knee joint: Secondary | ICD-10-CM | POA: Diagnosis not present

## 2017-01-18 DIAGNOSIS — Z7951 Long term (current) use of inhaled steroids: Secondary | ICD-10-CM | POA: Insufficient documentation

## 2017-01-18 DIAGNOSIS — Z86718 Personal history of other venous thrombosis and embolism: Secondary | ICD-10-CM | POA: Diagnosis not present

## 2017-01-18 DIAGNOSIS — D170 Benign lipomatous neoplasm of skin and subcutaneous tissue of head, face and neck: Secondary | ICD-10-CM | POA: Insufficient documentation

## 2017-01-18 DIAGNOSIS — L918 Other hypertrophic disorders of the skin: Secondary | ICD-10-CM | POA: Diagnosis not present

## 2017-01-18 DIAGNOSIS — G4730 Sleep apnea, unspecified: Secondary | ICD-10-CM | POA: Insufficient documentation

## 2017-01-18 DIAGNOSIS — Z79899 Other long term (current) drug therapy: Secondary | ICD-10-CM | POA: Diagnosis not present

## 2017-01-18 DIAGNOSIS — I10 Essential (primary) hypertension: Secondary | ICD-10-CM | POA: Diagnosis not present

## 2017-01-18 DIAGNOSIS — L723 Sebaceous cyst: Secondary | ICD-10-CM | POA: Diagnosis not present

## 2017-01-18 HISTORY — PX: MASS EXCISION: SHX2000

## 2017-01-18 SURGERY — MINOR EXCISION OF MASS
Anesthesia: LOCAL | Laterality: Right

## 2017-01-18 MED ORDER — LIDOCAINE-EPINEPHRINE 2 %-1:100000 IJ SOLN
INTRAMUSCULAR | Status: AC
Start: 2017-01-18 — End: ?
  Filled 2017-01-18: qty 1

## 2017-01-18 MED ORDER — LIDOCAINE-EPINEPHRINE 1 %-1:100000 IJ SOLN
INTRAMUSCULAR | Status: DC | PRN
  Administered 2017-01-18: 3 mL

## 2017-01-18 MED ORDER — CHLORHEXIDINE GLUCONATE CLOTH 2 % EX PADS
6.0000 | MEDICATED_PAD | Freq: Once | CUTANEOUS | Status: DC
Start: 2017-01-18 — End: 2017-01-18

## 2017-01-18 MED ORDER — ONDANSETRON HCL 4 MG/2ML IJ SOLN: 4.0000 mg | Freq: Once | INTRAMUSCULAR | Status: DC | PRN

## 2017-01-18 MED ORDER — FENTANYL CITRATE (PF) 100 MCG/2ML IJ SOLN: 25.0000 ug | INTRAMUSCULAR | Status: DC | PRN

## 2017-01-18 MED ORDER — CHLORHEXIDINE GLUCONATE CLOTH 2 % EX PADS: 6.0000 | MEDICATED_PAD | Freq: Once | CUTANEOUS | Status: DC

## 2017-01-18 MED ORDER — LIDOCAINE-EPINEPHRINE 1 %-1:100000 IJ SOLN
INTRAMUSCULAR | Status: AC
  Filled 2017-01-18: qty 1

## 2017-01-18 MED ORDER — BUPIVACAINE HCL (PF) 0.5 % IJ SOLN
INTRAMUSCULAR | Status: AC
Start: 2017-01-18 — End: ?
  Filled 2017-01-18: qty 30

## 2017-01-18 SURGICAL SUPPLY — 60 items
ADH SKN CLS APL DERMABOND .7 (GAUZE/BANDAGES/DRESSINGS) ×1
APL SKNCLS STERI-STRIP NONHPOA (GAUZE/BANDAGES/DRESSINGS)
BALL CTTN LRG ABS STRL LF (GAUZE/BANDAGES/DRESSINGS)
BANDAGE ADH SHEER 1  50/CT (GAUZE/BANDAGES/DRESSINGS) IMPLANT
BENZOIN TINCTURE PRP APPL 2/3 (GAUZE/BANDAGES/DRESSINGS) IMPLANT
BLADE SURG 15 STRL LF DISP TIS (BLADE) ×1 IMPLANT
BLADE SURG 15 STRL SS (BLADE) ×2
CANISTER SUCT 1200ML W/VALVE (MISCELLANEOUS) ×1 IMPLANT
CAUTERY EYE LOW TEMP 1300F FIN (OPHTHALMIC RELATED) IMPLANT
CLEANER CAUTERY TIP 5X5 PAD (MISCELLANEOUS) IMPLANT
CORD BIPOLAR FORCEPS 12FT (ELECTRODE) IMPLANT
COTTONBALL LRG STERILE PKG (GAUZE/BANDAGES/DRESSINGS) IMPLANT
DECANTER SPIKE VIAL GLASS SM (MISCELLANEOUS) IMPLANT
DERMABOND ADVANCED (GAUZE/BANDAGES/DRESSINGS) ×1
DERMABOND ADVANCED .7 DNX12 (GAUZE/BANDAGES/DRESSINGS) IMPLANT
DRSG TEGADERM 4X4.75 (GAUZE/BANDAGES/DRESSINGS) IMPLANT
ELECT COATED BLADE 2.86 ST (ELECTRODE) ×2 IMPLANT
ELECT REM PT RETURN 9FT ADLT (ELECTROSURGICAL) ×2
ELECTRODE REM PT RTRN 9FT ADLT (ELECTROSURGICAL) ×1 IMPLANT
GAUZE SPONGE 4X4 12PLY STRL LF (GAUZE/BANDAGES/DRESSINGS) IMPLANT
GAUZE SPONGE 4X4 16PLY XRAY LF (GAUZE/BANDAGES/DRESSINGS) IMPLANT
GLOVE BIO SURGEON STRL SZ7 (GLOVE) ×1 IMPLANT
GLOVE BIOGEL PI IND STRL 7.0 (GLOVE) IMPLANT
GLOVE BIOGEL PI INDICATOR 7.0 (GLOVE) ×1
GLOVE SS BIOGEL STRL SZ 7.5 (GLOVE) ×1 IMPLANT
GLOVE SUPERSENSE BIOGEL SZ 7.5 (GLOVE) ×1
GOWN STRL REUS W/ TWL LRG LVL3 (GOWN DISPOSABLE) IMPLANT
GOWN STRL REUS W/TWL LRG LVL3 (GOWN DISPOSABLE) ×2
MARKER SKIN DUAL TIP RULER LAB (MISCELLANEOUS) IMPLANT
NDL PRECISIONGLIDE 27X1.5 (NEEDLE) ×1 IMPLANT
NEEDLE PRECISIONGLIDE 27X1.5 (NEEDLE) ×2 IMPLANT
NS IRRIG 1000ML POUR BTL (IV SOLUTION) IMPLANT
PACK BASIN DAY SURGERY FS (CUSTOM PROCEDURE TRAY) ×1 IMPLANT
PAD CLEANER CAUTERY TIP 5X5 (MISCELLANEOUS)
PENCIL BUTTON HOLSTER BLD 10FT (ELECTRODE) ×2 IMPLANT
SPONGE INTESTINAL PEANUT (DISPOSABLE) IMPLANT
STRIP CLOSURE SKIN 1/2X4 (GAUZE/BANDAGES/DRESSINGS) IMPLANT
STRIP CLOSURE SKIN 1/4X4 (GAUZE/BANDAGES/DRESSINGS) IMPLANT
SUCTION FRAZIER HANDLE 10FR (MISCELLANEOUS)
SUCTION TUBE FRAZIER 10FR DISP (MISCELLANEOUS) IMPLANT
SUT CHROMIC 3 0 PS 2 (SUTURE) ×1 IMPLANT
SUT CHROMIC 4 0 P 3 18 (SUTURE) IMPLANT
SUT ETHILON 5 0 P 3 18 (SUTURE)
SUT ETHILON 6 0 P 1 (SUTURE) IMPLANT
SUT NYLON ETHILON 5-0 P-3 1X18 (SUTURE) IMPLANT
SUT PLAIN 5 0 P 3 18 (SUTURE) IMPLANT
SUT SILK 4 0 TIES 17X18 (SUTURE) IMPLANT
SUT VIC AB 4-0 P-3 18XBRD (SUTURE) IMPLANT
SUT VIC AB 4-0 P3 18 (SUTURE)
SUT VIC AB 5-0 P-3 18X BRD (SUTURE) IMPLANT
SUT VIC AB 5-0 P3 18 (SUTURE)
SWAB COLLECTION DEVICE MRSA (MISCELLANEOUS) IMPLANT
SWAB CULTURE ESWAB REG 1ML (MISCELLANEOUS) IMPLANT
SWABSTICK POVIDONE IODINE SNGL (MISCELLANEOUS) ×4 IMPLANT
SYR BULB 3OZ (MISCELLANEOUS) IMPLANT
SYR CONTROL 10ML LL (SYRINGE) ×2 IMPLANT
TOWEL OR 17X24 6PK STRL BLUE (TOWEL DISPOSABLE) ×2 IMPLANT
TRAY DSU PREP LF (CUSTOM PROCEDURE TRAY) IMPLANT
TUBE CONNECTING 20X1/4 (TUBING) ×1 IMPLANT
YANKAUER SUCT BULB TIP NO VENT (SUCTIONS) IMPLANT

## 2017-01-18 NOTE — Anesthesia Preprocedure Evaluation (Deleted)
Anesthesia Evaluation  Patient identified by MRN, date of birth, ID band Patient awake    Reviewed: Allergy & Precautions, NPO status , Patient's Chart, lab work & pertinent test results  Airway Mallampati: II  TM Distance: >3 FB Neck ROM: Full    Dental no notable dental hx. (+) Dental Advisory Given   Pulmonary sleep apnea and Continuous Positive Airway Pressure Ventilation ,    Pulmonary exam normal breath sounds clear to auscultation       Cardiovascular hypertension, + DVT  Normal cardiovascular exam Rhythm:Regular Rate:Normal  DVT 2004 s/p knee surgery   Neuro/Psych negative neurological ROS  negative psych ROS   GI/Hepatic Neg liver ROS, GERD  Controlled,  Endo/Other  Obesity  Renal/GU negative Renal ROS  negative genitourinary   Musculoskeletal  (+) Arthritis ,   Abdominal   Peds negative pediatric ROS (+)  Hematology negative hematology ROS (+)   Anesthesia Other Findings   Reproductive/Obstetrics negative OB ROS                             Anesthesia Physical Anesthesia Plan  ASA: II  Anesthesia Plan: General   Post-op Pain Management:    Induction: Intravenous  PONV Risk Score and Plan: 1 and Ondansetron, Dexamethasone and Treatment may vary due to age or medical condition  Airway Management Planned:   Additional Equipment:   Intra-op Plan:   Post-operative Plan: Extubation in OR  Informed Consent: I have reviewed the patients History and Physical, chart, labs and discussed the procedure including the risks, benefits and alternatives for the proposed anesthesia with the patient or authorized representative who has indicated his/her understanding and acceptance.   Dental advisory given  Plan Discussed with: CRNA  Anesthesia Plan Comments:         Anesthesia Quick Evaluation

## 2017-01-18 NOTE — Interval H&P Note (Signed)
History and Physical Interval Note:  01/18/2017 12:11 PM  [name]  has presented today for surgery, with the diagnosis of SEBACEOUS CYST  The various methods of treatment have been discussed with the patient and family. After consideration of risks, benefits and other options for treatment, the patient has consented to  Procedure(s): EXCISION BENIGN RIGHT NECK MASS (Right) as a surgical intervention .  The patient's history has been reviewed, patient examined, no change in status, stable for surgery.  I have reviewed the patient's chart and labs.  Questions were answered to the patient's satisfaction.     CHRISTOPHER NEWMAN

## 2017-01-18 NOTE — Brief Op Note (Signed)
01/18/2017  12:11 PM  PATIENT:  James Gallegos  66 y.o. male  PRE-OPERATIVE DIAGNOSIS:  SEBACEOUS CYST  POST-OPERATIVE DIAGNOSIS:  right neck mass  PROCEDURE:  Procedure(s): EXCISION BENIGN RIGHT NECK MASS (Right) with intermediate closure  SURGEON:  Surgeon(s) and Role:    Rozetta Nunnery, MD - Primary  PHYSICIAN ASSISTANT:   ASSISTANTS: none   ANESTHESIA:   local  EBL:  No intake/output data recorded.  BLOOD ADMINISTERED:none  DRAINS: none   LOCAL MEDICATIONS USED:  XYLOCAINE with EPI 4 cc  SPECIMEN:  Source of Specimen:  right neck mass  DISPOSITION OF SPECIMEN:  PATHOLOGY  COUNTS:  YES  TOURNIQUET:  * No tourniquets in log *  DICTATION: .Other Dictation: Dictation Number [id number]  PLAN OF CARE: Discharge to home after PACU  PATIENT DISPOSITION:  PACU - hemodynamically stable.   Delay start of Pharmacological VTE agent (>24hrs) due to surgical blood loss or risk of bleeding: not applicable

## 2017-01-18 NOTE — Discharge Instructions (Signed)
Keep dry today. May shower tomorrow. Call office if any concerns. Come by office or call in a week or two to check wound.

## 2017-01-19 ENCOUNTER — Encounter (HOSPITAL_BASED_OUTPATIENT_CLINIC_OR_DEPARTMENT_OTHER): Payer: Self-pay | Admitting: Otolaryngology

## 2017-01-19 NOTE — Op Note (Signed)
NAME:  [name], [name]                  ACCOUNT NO.:  MEDICAL RECORD NO.:  [id number]  LOCATION:                                 FACILITY:  PHYSICIAN:  Leonides Sake. Lucia Gaskins, M.D. DATE OF BIRTH:  DATE OF PROCEDURE:  01/18/2017 DATE OF DISCHARGE:                              OPERATIVE REPORT   PREOPERATIVE DIAGNOSIS:  Right neck mass, 1-1.5 cm size.  POSTOPERATIVE DIAGNOSIS:  Right neck mass, 1-1.5 cm size.  OPERATION:  Excision of right subcutaneous mass.  SURGEON:  Leonides Sake. Lucia Gaskins, M.D.  ANESTHESIA:  Local with 1:100,000 epinephrine, 4 mL.  COMPLICATIONS:  None.  BRIEF CLINICAL NOTE:  James Gallegos is a 66 year old gentleman, who has noticed a nodule and the subcutaneous air in the right neck over the last 3 weeks.  It has not been painful on exam.  He has about a 1 cm to 1.5 cm right subcutaneous nodule.  He is taken to the operating room at this time for excision under local anesthetic.  DESCRIPTION OF PROCEDURE:  Right neck was prepped with Betadine solution.  The nodule was marked out with a marking pen and then injected with 3-4 mL of Xylocaine with epinephrine for local anesthetic. Horizontal incision was made directly over the mass.  Dissection was carried down through the subcutaneous tissue and there was a firm nodule that measured about a centimeter in size.  It was more of a fatty nodule that was removed.  Further palpation revealed nothing really more deep. This was located more in the subcutaneous tissue.  Specimen sent to Pathology.  Hemostasis was obtained with cautery.  Defect was closed with 3-0 chromic sutures subcutaneously and Dermabond to reapproximate skin edges.  Following closure, 2 small skin tags were also excised and not sent as specimens.  The patient tolerated this well and is discharged home later this morning.  DISPOSITION:  The patient is discharged home.  Follow up next week. Concerning results of pathology and recheck of  wound.          ______________________________ Leonides Sake Lucia Gaskins, M.D.     CEN/MEDQ  D:  01/18/2017  T:  01/18/2017  Job:  [id number]

## 2017-03-14 DIAGNOSIS — G4733 Obstructive sleep apnea (adult) (pediatric): Secondary | ICD-10-CM | POA: Diagnosis not present

## 2017-03-23 ENCOUNTER — Other Ambulatory Visit: Payer: Self-pay | Admitting: Internal Medicine

## 2017-03-23 MED FILL — LOSARTAN-HCTZ 100-25 MG TAB: 100-25 | 90 days supply | Qty: 90 | Fill #0

## 2017-03-23 MED FILL — AMOXICILLIN 500 MG CAPSULE: 500 | 7 days supply | Qty: 21 | Fill #2

## 2017-04-29 MED FILL — EPLERENONE 25 MG TABS: 25 | 30 days supply | Qty: 30 | Fill #1

## 2017-05-09 MED FILL — AMOXICILLIN 500 MG CAPSULE: 500 | 7 days supply | Qty: 21 | Fill #3

## 2017-06-10 ENCOUNTER — Ambulatory Visit (INDEPENDENT_AMBULATORY_CARE_PROVIDER_SITE_OTHER): Payer: 59 | Admitting: Orthopaedic Surgery

## 2017-06-22 MED FILL — LOSARTAN-HCTZ 100-25 MG TAB: 100-25 | 90 days supply | Qty: 90 | Fill #1

## 2017-07-18 MED FILL — AMOXICILLIN 500 MG CAPSULE: 500 | 7 days supply | Qty: 21 | Fill #4

## 2017-07-20 ENCOUNTER — Ambulatory Visit (INDEPENDENT_AMBULATORY_CARE_PROVIDER_SITE_OTHER): Payer: 59

## 2017-07-20 ENCOUNTER — Encounter (INDEPENDENT_AMBULATORY_CARE_PROVIDER_SITE_OTHER): Payer: Self-pay | Admitting: Orthopedic Surgery

## 2017-07-20 ENCOUNTER — Ambulatory Visit (INDEPENDENT_AMBULATORY_CARE_PROVIDER_SITE_OTHER): Payer: 59 | Admitting: Orthopedic Surgery

## 2017-07-20 VITALS — BP 156/69 | HR 74 | Resp 16 | Ht 69.0 in | Wt 222.0 lb

## 2017-07-20 DIAGNOSIS — M1712 Unilateral primary osteoarthritis, left knee: Secondary | ICD-10-CM | POA: Diagnosis not present

## 2017-07-20 DIAGNOSIS — M25562 Pain in left knee: Secondary | ICD-10-CM

## 2017-07-20 DIAGNOSIS — M25552 Pain in left hip: Secondary | ICD-10-CM

## 2017-07-20 MED ORDER — METHYLPREDNISOLONE ACETATE 40 MG/ML IJ SUSP
80.0000 mg | INTRAMUSCULAR | Status: AC | PRN
  Administered 2017-07-20: 80 mg

## 2017-07-20 MED ORDER — LIDOCAINE HCL 1 % IJ SOLN
2.0000 mL | INTRAMUSCULAR | Status: AC | PRN
  Administered 2017-07-20: 2 mL

## 2017-07-20 MED ORDER — BUPIVACAINE HCL 0.5 % IJ SOLN
2.0000 mL | INTRAMUSCULAR | Status: AC | PRN
  Administered 2017-07-20: 2 mL via INTRA_ARTICULAR

## 2017-07-20 MED ORDER — DICLOFENAC SODIUM 50 MG PO TBEC: 50.0000 mg | DELAYED_RELEASE_TABLET | Freq: Two times a day (BID) | ORAL | 1 refills | Status: DC

## 2017-07-20 MED FILL — DICLOFENAC SOD EC 50 MG TAB: 50 | 30 days supply | Qty: 60 | Fill #0

## 2017-07-20 NOTE — Progress Notes (Signed)
Office Visit Note   Patient: James Gallegos           Date of Birth: May 01, 1951           MRN: [id number] Visit Date: 07/20/2017              Requested by: Aldine Contes, MD [address], [place], [place] [address] PCP: Aldine Contes, MD   Assessment & Plan: Visit Diagnoses:  1. Unilateral primary osteoarthritis, left knee   2. Acute pain of left knee   3. Pain of left hip joint     Plan:  #1: Corticosteroid injection to the left knee was given atraumatically. #2: A prescription was given for Voltaren 50 mg p.o. daily to twice daily which I explained in the office today.  I did warn him of the precaution and possible gastrointestinal and renal problems.  He is understanding.  I did offer him Voltaren gel which apparently he uses at home and has plenty of it but can only put it on once a day he states. #3: In addition we have had at least a 15-20-minute conversation about doses for his left knee.  He wants to try to stay with conservative treatment.  Therefore the cortisone injection was ordered.  I have also discussed Visco supplementation which he may consider in the future.  Follow-Up Instructions: Return if symptoms worsen or fail to improve.   Face-to-face time spent with patient was greater than 20 minutes.  Greater than 50% of the time was spent in counseling and coordination of care.  Orders:  Orders Placed This Encounter  Procedures  . XR HIP UNILAT W OR W/O PELVIS 2-3 VIEWS LEFT  . XR Knee Complete 4 Views Left   Meds ordered this encounter  Medications  . diclofenac (VOLTAREN) 50 MG EC tablet    Sig: Take 1 tablet (50 mg total) by mouth 2 (two) times daily.    Dispense:  60 tablet    Refill:  1    Order Specific Question:   Supervising Provider    Answer:   [name] [[id number]]      Procedures: Large Joint Inj: L knee on 07/20/2017 11:42 AM Indications: pain and diagnostic evaluation Details: 25 G 1.5 in needle, anteromedial  approach  Arthrogram: No  Medications: 2 mL lidocaine 1 %; 2 mL bupivacaine 0.5 %; 80 mg methylPREDNISolone acetate 40 MG/ML Procedure, treatment alternatives, risks and benefits explained, specific risks discussed. Consent was given by the patient. Patient was prepped and draped in the usual sterile fashion.       Clinical Data: No additional findings.   Subjective: Chief Complaint  Patient presents with  . Left Knee - Pain  . Knee Pain    Left knee pain x months, worsening 07/15/17 stiffness, tenderness, couldn't lift leg, no injury, difficulty sleeping at night, no swelling, not diabetic, arthroscopic surgery x 2, IBU doesn't help    HPI  James Gallegos is a 67 year old white male who is seen today for evaluation of his left knee.  He is status post right total knee arthroplasty with excellent results.  However he started having pain and discomfort in his knee on 15 July 2017 with stiffness and tenderness and could not even lift his leg.  He has difficulty sleeping at night.  He denied any swelling.  He has had arthroscopic surgery in the past on this.  However over the last several days he has had some improvement in his knee to  where he is able to ambulate some.  His type of work is where he is out on rough terrain at times and this bothers him.  Today though he states that his pain is about 75% improved.  Denies any neurovascular bites.  Denies any calf pain.  Denies any groin pain.  Today for evaluation.  Review of Systems  Constitutional: Negative for activity change.  HENT: Negative for trouble swallowing.   Eyes: Negative for pain.  Respiratory: Negative for shortness of breath.   Cardiovascular: Negative for leg swelling.  Gastrointestinal: Negative for constipation.  Endocrine: Negative for cold intolerance.  Genitourinary: Negative for difficulty urinating.  Musculoskeletal: Negative for gait problem.  Skin: Negative for rash.  Allergic/Immunologic: Negative for food  allergies.  Neurological: Negative for numbness.  Hematological: Does not bruise/bleed easily.  Psychiatric/Behavioral: Positive for sleep disturbance.     Objective: Vital Signs: BP (!) 156/69 (BP Location: Right Arm, Patient Position: Sitting, Cuff Size: Normal)   Pulse 74   Resp 16   Ht 5\' 9"  (1.753 m)   Wt 222 lb (100.7 kg)   BMI 32.78 kg/m   Physical Exam  Constitutional: He is oriented to person, place, and time. He appears well-developed and well-nourished.  HENT:  Head: Normocephalic and atraumatic.  Eyes: EOM are normal. Pupils are equal, round, and reactive to light.  Pulmonary/Chest: Effort normal.  Neurological: He is alert and oriented to person, place, and time.  Skin: Skin is warm and dry.  Psychiatric: He has a normal mood and affect. His behavior is normal. Judgment and thought content normal.    Ortho Exam  Exam today of the knee reveals a 3-5 degrees shy of full extension with flexion to about 105 degrees.  He does have crepitance with range of motion.  Pseudolaxity at the medial compartment but does have a good endpoint.  Trace effusion is noted.  Skin is intact.  No warmth or erythema.  He does have decreased external rotation of the left hip but does not particularly cause him pain.  Specialty Comments:  No specialty comments available.  Imaging: Xr Hip Unilat W Or W/o Pelvis 2-3 Views Left  Result Date: 07/20/2017 AP pelvis and left hip reveals some flattening of the superior lateral head with some periarticular spurring noted.  Maintaining good joint space.  Does have some mild degenerative changes in the SI joints.  Also notes L4-5 L5-S1 degenerative disc disease.  Xr Knee Complete 4 Views Left  Result Date: 07/20/2017 4 view x-ray of the left knee reveals bone-on-bone medial compartment arthritis with sclerosing of the medial tibial plateau more than the medial femoral condyle.  He has periarticular spurring also laterally.  There is slight medial  translation of the distal femur medially on the tibial plateau.  Lateral patellar tilt with significant periarticular spurring in the patellofemoral joint. There is slight    PMFS History:  Current Outpatient Medications  Medication Sig Dispense Refill  . amoxicillin (AMOXIL) 500 MG capsule Take 1,500 mg by mouth once.    . Ascorbic Acid (VITAMIN C) 1000 MG tablet Take 5,000 mg by mouth daily.    . B Complex-C (B-COMPLEX WITH VITAMIN C) tablet Take 1 tablet by mouth daily.    . Cholecalciferol (VITAMIN D-3) 5000 units TABS Take 5,000 Units by mouth daily.    . Cyanocobalamin (VITAMIN B-12) 5000 MCG TBDP Take 5,000 mcg by mouth daily.    Marland Kitchen eplerenone (INSPRA) 25 MG tablet Take 1 tablet (25 mg total) by mouth  daily. 90 tablet 1  . losartan-hydrochlorothiazide (HYZAAR) 100-25 MG tablet TAKE 1 TABLET BY MOUTH DAILY. 90 tablet 1  . acetaminophen (TYLENOL) 500 MG tablet Take 2 tablets (1,000 mg total) by mouth every 6 (six) hours. (Patient not taking: Reported on 07/20/2017) 30 tablet 0  . diclofenac (VOLTAREN) 50 MG EC tablet Take 1 tablet (50 mg total) by mouth 2 (two) times daily. 60 tablet 1  . fluticasone (FLONASE) 50 MCG/ACT nasal spray Place 2 sprays into the nose daily. (Patient taking differently: Place 1-2 sprays into the nose at bedtime as needed (for allergies/nasal symptoms). ) 16 g 2   No current facility-administered medications for this visit.     Patient Active Problem List   Diagnosis Date Noted  . Low testosterone 09/26/2016  . Healthcare maintenance 09/26/2016  . S/P total knee replacement using cement, right 06/01/2016  . Sleep apnea 05/19/2016  . Hypertension 05/19/2016   Past Medical History:  Diagnosis Date  . Arthritis   . Deep vein thrombosis ([place])    after knee surgery in 2004  . GERD (gastroesophageal reflux disease)   . Hypertension   . Murmur, cardiac   . Sleep apnea    CPAP    Family History  Problem Relation Age of Onset  . Alzheimer's disease Father    . Colon cancer Neg Hx   . Esophageal cancer Neg Hx   . Rectal cancer Neg Hx   . Stomach cancer Neg Hx     Past Surgical History:  Procedure Laterality Date  . CERVICAL SPINE SURGERY N/A 1990   C5-6 discectomy  . FINGER SURGERY Right 2008   x 3 index finger  . KNEE ARTHROSCOPY Bilateral    L 2004, R 2005  . MASS EXCISION Right 01/18/2017   Procedure: EXCISION BENIGN RIGHT NECK MASS;  Surgeon: Rozetta Nunnery, MD;  Location: [facility];  Service: ENT;  Laterality: Right;  . 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 TOTAL KNEE ARTHROPLASTY Right 06/01/2016   Procedure: TOTAL KNEE ARTHROPLASTY;  Surgeon: [name], MD;  Location: [facility];  Service: Orthopedics;  Laterality: Right;   Social History   Occupational History  . Not on file  Tobacco Use  . Smoking status: Never Smoker  . Smokeless tobacco: Never Used  Substance and Sexual Activity  . Alcohol use: Yes    Comment: rarely  . Drug use: No  . Sexual activity: Not on file

## 2017-07-25 DIAGNOSIS — H5212 Myopia, left eye: Secondary | ICD-10-CM | POA: Diagnosis not present

## 2017-08-03 DIAGNOSIS — L723 Sebaceous cyst: Secondary | ICD-10-CM | POA: Diagnosis not present

## 2017-08-03 DIAGNOSIS — S60351A Superficial foreign body of right thumb, initial encounter: Secondary | ICD-10-CM | POA: Diagnosis not present

## 2017-08-03 MED FILL — CEPHALEXIN 500 MG CAPSULE: 500 | 5 days supply | Qty: 15 | Fill #0

## 2017-08-09 DIAGNOSIS — G4733 Obstructive sleep apnea (adult) (pediatric): Secondary | ICD-10-CM | POA: Diagnosis not present

## 2017-09-15 MED FILL — AMOXICILLIN 500 MG CAPSULE: 500 | 7 days supply | Qty: 21 | Fill #5

## 2017-09-26 ENCOUNTER — Other Ambulatory Visit: Payer: Self-pay | Admitting: Internal Medicine

## 2017-09-26 MED FILL — LOSARTAN-HCTZ 100-25 MG TAB: 100-25 | 90 days supply | Qty: 90 | Fill #0

## 2017-10-19 ENCOUNTER — Encounter: Payer: Self-pay | Admitting: Internal Medicine

## 2017-10-19 ENCOUNTER — Other Ambulatory Visit: Payer: Self-pay | Admitting: Internal Medicine

## 2017-10-25 MED FILL — AMOXICILLIN 500 MG CAPSULE: 500 | 7 days supply | Qty: 21 | Fill #6

## 2017-11-01 ENCOUNTER — Encounter (INDEPENDENT_AMBULATORY_CARE_PROVIDER_SITE_OTHER): Payer: Self-pay | Admitting: Orthopaedic Surgery

## 2017-11-01 ENCOUNTER — Ambulatory Visit (INDEPENDENT_AMBULATORY_CARE_PROVIDER_SITE_OTHER): Payer: 59

## 2017-11-01 ENCOUNTER — Ambulatory Visit (INDEPENDENT_AMBULATORY_CARE_PROVIDER_SITE_OTHER): Payer: 59 | Admitting: Orthopaedic Surgery

## 2017-11-01 VITALS — BP 174/71 | HR 68 | Resp 18 | Ht 69.0 in | Wt 217.0 lb

## 2017-11-01 DIAGNOSIS — M25562 Pain in left knee: Secondary | ICD-10-CM | POA: Diagnosis not present

## 2017-11-01 DIAGNOSIS — G8929 Other chronic pain: Secondary | ICD-10-CM

## 2017-11-01 DIAGNOSIS — M25511 Pain in right shoulder: Secondary | ICD-10-CM

## 2017-11-01 MED ORDER — LIDOCAINE HCL 1 % IJ SOLN
2.0000 mL | INTRAMUSCULAR | Status: AC | PRN
Start: 2017-11-01 — End: 2017-11-01
  Administered 2017-11-01: 2 mL

## 2017-11-01 MED ORDER — METHYLPREDNISOLONE ACETATE 40 MG/ML IJ SUSP
80.0000 mg | INTRAMUSCULAR | Status: AC | PRN
  Administered 2017-11-01: 80 mg

## 2017-11-01 MED ORDER — BUPIVACAINE HCL 0.5 % IJ SOLN
2.0000 mL | INTRAMUSCULAR | Status: AC | PRN
  Administered 2017-11-01: 2 mL via INTRA_ARTICULAR

## 2017-11-01 NOTE — Progress Notes (Signed)
Office Visit Note   Patient: James Gallegos           Date of Birth: Jan 27, 1951           MRN: [id number] Visit Date: 11/01/2017              Requested by: Aldine Contes, MD [address] [place], [place] [address] PCP: Aldine Contes, MD   Assessment & Plan: Visit Diagnoses:  1. Acute pain of right shoulder   2. Chronic pain of left knee     Plan: Osteoarthritis left knee.  Will repeat cortisone injection to response.  Acute onset of right shoulder pain within the last several weeks while walking his dog.  Could have a rotator cuff tear.  Will monitor this as well and have him return in 2 weeks if no improvement.  Consider subacromial cortisone injection and/or MRI scan  Follow-Up Instructions: Return if symptoms worsen or fail to improve.   Orders:  Orders Placed This Encounter  Procedures  . Large Joint Inj: L knee  . XR Shoulder Right   No orders of the defined types were placed in this encounter.     Procedures: Large Joint Inj: L knee on 11/01/2017 3:55 PM Indications: pain and diagnostic evaluation Details: 25 G 1.5 in needle, anteromedial approach  Arthrogram: No  Medications: 2 mL lidocaine 1 %; 2 mL bupivacaine 0.5 %; 80 mg methylPREDNISolone acetate 40 MG/ML Procedure, treatment alternatives, risks and benefits explained, specific risks discussed. Consent was given by the patient. Patient was prepped and draped in the usual sterile fashion.       Clinical Data: No additional findings.   Subjective: Chief Complaint  Patient presents with  . Right Knee - Numbness    NOTICED NUMBNESS A FEW WEEKS AGO  . Left Knee - Pain  . Right Shoulder - Pain  . New Patient (Initial Visit)    L KNEE PAIN HAD INJECTION 07/2017 HELPED UNTIL NOW, FEW WEEKS AGO WALKING DOG AND DOG PULLED LEAS BACK AROUND BACK  Mr. Darnell has an established diagnosis of left knee osteoarthritis.  Cortisone in the past with good relief.  He is having some recurrent  pain. He also relates acute onset of right shoulder pain within the last several weeks.  He was walking 1 of his pit bulls when it turned and raised behind him.  It jerked his shoulder with internal rotation with immediate onset of shoulder pain.  He is better but still having some trouble sleeping.  He is not had any evidence of ecchymosis or erythema.  Is not had any numbness or tingling.  Does have some difficulty raising his arm over his head.  He was not having any problem prior to that injury  HPI  Review of Systems  Constitutional: Negative for fatigue and fever.  HENT: Negative for ear pain.   Eyes: Negative for pain.  Respiratory: Negative for cough and shortness of breath.   Cardiovascular: Negative for leg swelling.  Gastrointestinal: Negative for constipation and diarrhea.  Genitourinary: Negative for difficulty urinating.  Musculoskeletal: Positive for back pain. Negative for neck pain.  Skin: Negative for rash.  Allergic/Immunologic: Negative for food allergies.  Neurological: Positive for weakness and numbness.  Hematological: Does not bruise/bleed easily.  Psychiatric/Behavioral: Positive for sleep disturbance.     Objective: Vital Signs: BP (!) 174/71 (BP Location: Left Arm, Patient Position: Sitting, Cuff Size: Normal)   Pulse 68   Resp 18   Ht 5\' 9"  (  1.753 m)   Wt 217 lb (98.4 kg)   BMI 32.05 kg/m   Physical Exam  Constitutional: He is oriented to person, place, and time. He appears well-developed and well-nourished.  HENT:  Mouth/Throat: Oropharynx is clear and moist.  Eyes: Pupils are equal, round, and reactive to light. EOM are normal.  Pulmonary/Chest: Effort normal.  Neurological: He is alert and oriented to person, place, and time.  Skin: Skin is warm and dry.  Psychiatric: He has a normal mood and affect. His behavior is normal.    Ortho Exam awake alert and oriented x3.  Comfortable sitting.  Exam the left knee reveals some mild tenderness along  the anterior medial joint line.  No effusion.  Mild patellar crepitation.  No lateral joint pain.  No distal edema.  Neurovascular exam intact Right shoulder with positive impingement.  Circuitous more arc of overhead motion.  Skin intact.  Biceps intact.  Good grip and good release.  No neck pain.  Specialty Comments:  No specialty comments available.  Imaging: Xr Shoulder Right  Result Date: 11/01/2017 Films of the right shoulder obtained in several projections.  The humeral head is centered about the glenoid.  No ectopic calcification.  Normal space between the humeral head and the acromion.  Degenerative arthritis at the acromioclavicular joint with some inferiorly directed spurs both sides of the joint.  No acute changes    PMFS History: Patient Active Problem List   Diagnosis Date Noted  . Low testosterone 09/26/2016  . Healthcare maintenance 09/26/2016  . S/P total knee replacement using cement, right 06/01/2016  . Sleep apnea 05/19/2016  . Hypertension 05/19/2016   Past Medical History:  Diagnosis Date  . Arthritis   . Deep vein thrombosis ([facility])    after knee surgery in 2004  . GERD (gastroesophageal reflux disease)   . Hypertension   . Murmur, cardiac   . Sleep apnea    CPAP    Family History  Problem Relation Age of Onset  . Alzheimer's disease Father   . Colon cancer Neg Hx   . Esophageal cancer Neg Hx   . Rectal cancer Neg Hx   . Stomach cancer Neg Hx     Past Surgical History:  Procedure Laterality Date  . CERVICAL SPINE SURGERY N/A 1990   C5-6 discectomy  . FINGER SURGERY Right 2008   x 3 index finger  . KNEE ARTHROSCOPY Bilateral    L 2004, R 2005  . MASS EXCISION Right 01/18/2017   Procedure: EXCISION BENIGN RIGHT NECK MASS;  Surgeon: Rozetta Nunnery, MD;  Location: [facility];  Service: ENT;  Laterality: Right;  . 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 TOTAL KNEE ARTHROPLASTY Right 06/01/2016   Procedure: TOTAL KNEE  ARTHROPLASTY;  Surgeon: [name], MD;  Location: [facility];  Service: Orthopedics;  Laterality: Right;   Social History   Occupational History  . Not on file  Tobacco Use  . Smoking status: Never Smoker  . Smokeless tobacco: Never Used  Substance and Sexual Activity  . Alcohol use: Yes    Comment: rarely  . Drug use: No  . Sexual activity: Not on file

## 2017-11-04 ENCOUNTER — Ambulatory Visit (INDEPENDENT_AMBULATORY_CARE_PROVIDER_SITE_OTHER): Payer: 59 | Admitting: Orthopaedic Surgery

## 2017-11-09 MED FILL — AMOXICILLIN 500 MG CAPSULE: 500 | 3 days supply | Qty: 12 | Fill #0

## 2017-11-21 ENCOUNTER — Encounter: Payer: 59 | Admitting: Internal Medicine

## 2017-11-21 ENCOUNTER — Other Ambulatory Visit: Payer: Self-pay

## 2017-11-21 ENCOUNTER — Ambulatory Visit (INDEPENDENT_AMBULATORY_CARE_PROVIDER_SITE_OTHER): Payer: 59 | Admitting: Internal Medicine

## 2017-11-21 VITALS — BP 134/67 | HR 57 | Temp 98.6°F | Ht 69.0 in | Wt 221.6 lb

## 2017-11-21 DIAGNOSIS — I10 Essential (primary) hypertension: Secondary | ICD-10-CM | POA: Diagnosis not present

## 2017-11-21 DIAGNOSIS — Z79899 Other long term (current) drug therapy: Secondary | ICD-10-CM | POA: Diagnosis not present

## 2017-11-21 DIAGNOSIS — E7889 Other lipoprotein metabolism disorders: Secondary | ICD-10-CM | POA: Diagnosis not present

## 2017-11-21 DIAGNOSIS — Z0001 Encounter for general adult medical examination with abnormal findings: Secondary | ICD-10-CM | POA: Diagnosis not present

## 2017-11-21 DIAGNOSIS — Z0000 Encounter for general adult medical examination without abnormal findings: Secondary | ICD-10-CM

## 2017-11-21 DIAGNOSIS — Z23 Encounter for immunization: Secondary | ICD-10-CM | POA: Diagnosis not present

## 2017-11-21 DIAGNOSIS — M1990 Unspecified osteoarthritis, unspecified site: Secondary | ICD-10-CM | POA: Insufficient documentation

## 2017-11-21 DIAGNOSIS — E785 Hyperlipidemia, unspecified: Secondary | ICD-10-CM

## 2017-11-21 DIAGNOSIS — M150 Primary generalized (osteo)arthritis: Secondary | ICD-10-CM

## 2017-11-21 DIAGNOSIS — E7849 Other hyperlipidemia: Secondary | ICD-10-CM

## 2017-11-21 DIAGNOSIS — M159 Polyosteoarthritis, unspecified: Secondary | ICD-10-CM

## 2017-11-21 MED ORDER — MELOXICAM 7.5 MG PO TABS: 7.5000 mg | ORAL_TABLET | Freq: Every day | ORAL | 2 refills | Status: DC

## 2017-11-21 MED FILL — MELOXICAM 7.5 MG TABLET: 7.5 | 30 days supply | Qty: 30 | Fill #0

## 2017-11-21 NOTE — Assessment & Plan Note (Addendum)
Lipid Panel     Component Value Date/Time   CHOL 194 10/07/2016 1009   TRIG 511 (H) 10/07/2016 1009   HDL 29 (L) 10/07/2016 1009   CHOLHDL 6.7 (H) 10/07/2016 1009   [place] Comment 10/07/2016 1009   Repeating today as patient did not eat and only had coffee. May be able to get a more accurate LDL.   Plan: -Lipid panel pending will f/u  ADDENEDUM 11/22/2017 Lipid Panel     Component Value Date/Time   CHOL 178 11/21/2017 0928   TRIG 128 11/21/2017 0928   HDL 34 (L) 11/21/2017 0928   CHOLHDL 5.2 (H) 11/21/2017 0928   LDLCALC 118 (H) 11/21/2017 9628  Calculated ASCVD risk score 11.2%. Patient would benefit from moderate intensity statin therapy. Discussed this with the patient and he does not want to go on a statin at this time. He wants to continue with diet and exercise.   Plan: -Please consider repeating lipid panel at next annual visit and discussing statin therapy if the patient is amenable and lipid panel remains abnormal.

## 2017-11-21 NOTE — Assessment & Plan Note (Signed)
1. Patient had colonoscopy 11/2016, which demonstrated two tubular adenomatous polyps and diverticulosis. He will have repeat colonoscopy in 3 years (11/2019).   2. Patient is also due for Pneumovax today. Will administer today.   3. Lipid panel from last visit had elevated triglycerides (511) and LDL could not be obtained. Will repeat today.

## 2017-11-21 NOTE — Patient Instructions (Addendum)
James Gallegos,   It was a pleasure meeting you today.   Continue to take Losartan-HCTZ for your blood pressure. Continue diet and exercise! You have lost 9 pounds since your last visit.   For your arthritis, I have started Meloxicam (Mobic) 7.5 mg daily. Do not take additional NSAIDs when taking this medication. If you need additional relief take Tylenol.   I will call you with your lab results.

## 2017-11-21 NOTE — Assessment & Plan Note (Addendum)
BP Readings from Last 3 Encounters:  11/21/17 134/67  11/01/17 (!) 174/71  07/20/17 (!) 156/69   Patient previously prescribed Eplerenone 25 mg daily and Losartan-HCTZ 100-25 mg daily. He states he stopped taking the Eplerenone several months ago because of urinary frequency. His blood pressure is well controlled on Hyzaar only.  Patient has also lost 9 pounds intentionally since last year, which is likely contributing to his better blood pressure control.  Plan: -Continue Hyzaar 100-25 mg daily  -Repeat BMP today  -Discontinue Eplerenone  -Encouraged continuing diet and exercise

## 2017-11-21 NOTE — Progress Notes (Signed)
   CC: hypertension, osteoarthritis  HPI:  James Gallegos is a 67 y.o. male with past medical history as documented below presenting for his annual exam and for follow up of his hypertension.  Please see encounter based charting for detailed description of the patient's acute and chronic medical problems.  Past Medical History:  Diagnosis Date  . Arthritis   . Deep vein thrombosis ([place])    after knee surgery in 2004  . GERD (gastroesophageal reflux disease)   . Hypertension   . Murmur, cardiac   . Sleep apnea    CPAP   Review of Systems:   Review of Systems  Constitutional: Positive for weight loss. Negative for chills, fever and malaise/fatigue.  HENT: Negative for congestion, hearing loss and sinus pain.   Eyes: Negative for blurred vision and double vision.  Respiratory: Negative for shortness of breath.   Cardiovascular: Negative for chest pain and palpitations.  Gastrointestinal: Positive for heartburn. Negative for blood in stool, constipation, diarrhea and melena.  Genitourinary: Negative.   Musculoskeletal: Positive for joint pain. Negative for falls.  Skin: Negative for rash.  Neurological: Negative.   Endo/Heme/Allergies: Negative.  Negative for polydipsia.  Psychiatric/Behavioral: Negative.     Physical Exam:  Vitals:   11/21/17 0833  BP: 134/67  Pulse: (!) 57  Temp: 98.6 F (37 C)  TempSrc: Oral  SpO2: 96%  Weight: 221 lb 9.6 oz (100.5 kg)  Height: 5\' 9"  (1.753 m)   Physical Exam  Constitutional: He is oriented to person, place, and time. He appears well-developed and well-nourished. No distress.  HENT:  Head: Normocephalic and atraumatic.  Mouth/Throat: Oropharynx is clear and moist.  Eyes: Conjunctivae are normal. No scleral icterus.  Neck: Normal range of motion. Neck supple. No thyromegaly present.  Cardiovascular: Normal rate, regular rhythm and normal heart sounds. Exam reveals no gallop and no friction rub.  No murmur  heard. Pulmonary/Chest: Effort normal and breath sounds normal. He has no wheezes. He has no rales.  Abdominal: Soft. Bowel sounds are normal. There is no tenderness.  Musculoskeletal: Normal range of motion. He exhibits no edema.  Lymphadenopathy:    He has no cervical adenopathy.  Neurological: He is alert and oriented to person, place, and time. He has normal strength. No cranial nerve deficit.  Skin: Skin is warm and dry.  Psychiatric: He has a normal mood and affect.    Assessment & Plan:   See Encounters Tab for problem based charting.  Patient discussed with Dr. Dareen Piano

## 2017-11-21 NOTE — Assessment & Plan Note (Addendum)
Patient follows with orthopedics for multiple joint osteoarthritis. He has a right total knee replacement in 2018. He states that he also has right foot pain and left knee pain. He is on his feet a lot at work and states some days the knee and foot pain is unbearable. He frequently takes OTC ibuprofen, which makes the pain tolerable. He has previously taken Vicodin, which helped a lot but he is not interested in taking a narcotic. He has never tried Meloxicam.   Plan: -Meloxicam 7.5 mg daily  -Continue diet and exercise

## 2017-11-22 LAB — BMP8+ANION GAP
ANION GAP: 15 mmol/L (ref 10.0–18.0)
BUN/Creatinine Ratio: 22 (ref 10–24)
BUN: 22 mg/dL (ref 8–27)
CO2: 23 mmol/L (ref 20–29)
Calcium: 9.5 mg/dL (ref 8.6–10.2)
Chloride: 104 mmol/L (ref 96–106)
Creatinine, Ser: 1.01 mg/dL (ref 0.76–1.27)
GFR calc Af Amer: 88 mL/min/{1.73_m2} (ref >59)
GFR calc non Af Amer: 76 mL/min/{1.73_m2} (ref >59)
GLUCOSE: 126 mg/dL — AB (ref 65–99)
POTASSIUM: 4 mmol/L (ref 3.5–5.2)
SODIUM: 142 mmol/L (ref 134–144)

## 2017-11-22 LAB — LIPID PANEL
CHOL/HDL RATIO: 5.2 ratio — AB (ref 0.0–5.0)
CHOLESTEROL TOTAL: 178 mg/dL (ref 100–199)
HDL: 34 mg/dL — ABNORMAL LOW (ref >39)
LDL Calculated: 118 mg/dL — ABNORMAL HIGH (ref 0–99)
TRIGLYCERIDES: 128 mg/dL (ref 0–149)
VLDL Cholesterol Cal: 26 mg/dL (ref 5–40)

## 2017-11-23 NOTE — Progress Notes (Signed)
Internal Medicine Clinic Attending  Case discussed with Dr. LaCroce at the time of the visit.  We reviewed the resident's history and exam and pertinent patient test results.  I agree with the assessment, diagnosis, and plan of care documented in the resident's note.  

## 2017-12-19 MED FILL — MELOXICAM 7.5 MG TABLET: 7.5 | 30 days supply | Qty: 30 | Fill #1

## 2017-12-26 MED FILL — LOSARTAN-HCTZ 100-25 MG TAB: 100-25 | 90 days supply | Qty: 90 | Fill #1

## 2018-02-07 DIAGNOSIS — G4733 Obstructive sleep apnea (adult) (pediatric): Secondary | ICD-10-CM | POA: Diagnosis not present

## 2018-03-20 ENCOUNTER — Other Ambulatory Visit (INDEPENDENT_AMBULATORY_CARE_PROVIDER_SITE_OTHER): Payer: Self-pay | Admitting: Radiology

## 2018-03-20 ENCOUNTER — Encounter (INDEPENDENT_AMBULATORY_CARE_PROVIDER_SITE_OTHER): Payer: Self-pay | Admitting: Orthopaedic Surgery

## 2018-03-20 ENCOUNTER — Ambulatory Visit (INDEPENDENT_AMBULATORY_CARE_PROVIDER_SITE_OTHER): Payer: 59 | Admitting: Orthopaedic Surgery

## 2018-03-20 VITALS — BP 161/67 | HR 67 | Ht 69.0 in | Wt 221.0 lb

## 2018-03-20 DIAGNOSIS — G8929 Other chronic pain: Secondary | ICD-10-CM | POA: Diagnosis not present

## 2018-03-20 DIAGNOSIS — M25562 Pain in left knee: Secondary | ICD-10-CM

## 2018-03-20 MED ORDER — METHYLPREDNISOLONE ACETATE 40 MG/ML IJ SUSP
80.0000 mg | INTRAMUSCULAR | Status: AC | PRN
  Administered 2018-03-20: 80 mg

## 2018-03-20 MED ORDER — LIDOCAINE HCL 1 % IJ SOLN
2.0000 mL | INTRAMUSCULAR | Status: AC | PRN
  Administered 2018-03-20: 2 mL

## 2018-03-20 MED ORDER — METHOCARBAMOL 500 MG PO TABS: ORAL_TABLET | ORAL | 0 refills | Status: DC

## 2018-03-20 MED ORDER — BUPIVACAINE HCL 0.5 % IJ SOLN
2.0000 mL | INTRAMUSCULAR | Status: AC | PRN
  Administered 2018-03-20: 2 mL via INTRA_ARTICULAR

## 2018-03-20 MED FILL — METHOCARBAMOL 500 MG TABS: 500 | 15 days supply | Qty: 30 | Fill #0

## 2018-03-20 NOTE — Progress Notes (Signed)
Office Visit Note   Patient: James Gallegos           Date of Birth: 20-Jul-1950           MRN: [id number] Visit Date: 03/20/2018              Requested by: Aldine Contes, MD [address], [place], [place] [address] PCP: Aldine Contes, MD   Assessment & Plan: Visit Diagnoses:  1. Chronic pain of left knee     Plan: Osteoarthritis left knee last cortisone injection in May.  We will repeat injection  Follow-Up Instructions: Return if symptoms worsen or fail to improve.   Orders:  Orders Placed This Encounter  Procedures  . Large Joint Inj: L knee   No orders of the defined types were placed in this encounter.     Procedures: Large Joint Inj: L knee on 03/20/2018 4:52 PM Indications: pain and diagnostic evaluation Details: 25 G 1.5 in needle, anteromedial approach  Arthrogram: No  Medications: 2 mL lidocaine 1 %; 2 mL bupivacaine 0.5 %; 80 mg methylPREDNISolone acetate 40 MG/ML Procedure, treatment alternatives, risks and benefits explained, specific risks discussed. Consent was given by the patient. Patient was prepped and draped in the usual sterile fashion.       Clinical Data: No additional findings.   Subjective: Chief Complaint  Patient presents with  . Follow-up    L KNEE PAIN FOR FEW WEEKS NO NEW INJURY, HAD INJECTION IN 10/2017,   R KNEE STILL HAVING SOME PAIN AND SWELLING  Disown injection left knee and may made a big difference.  Would like another  HPI  Review of Systems  Constitutional: Negative for fatigue and fever.  HENT: Negative for ear pain.   Eyes: Negative for pain.  Respiratory: Negative for cough and shortness of breath.   Cardiovascular: Negative for leg swelling.  Gastrointestinal: Negative for constipation and diarrhea.  Genitourinary: Negative for difficulty urinating.  Musculoskeletal: Negative for back pain and neck pain.  Skin: Negative for rash.  Neurological: Positive for weakness. Negative for  numbness.  Hematological: Does not bruise/bleed easily.  Psychiatric/Behavioral: Positive for sleep disturbance.     Objective: Vital Signs: BP (!) 161/67 (BP Location: Left Arm, Patient Position: Sitting, Cuff Size: Normal)   Pulse 67   Ht 5\' 9"  (1.753 m)   Wt 221 lb (100.2 kg)   BMI 32.64 kg/m   Physical Exam  Constitutional: He is oriented to person, place, and time. He appears well-developed and well-nourished.  HENT:  Mouth/Throat: Oropharynx is clear and moist.  Eyes: Pupils are equal, round, and reactive to light. EOM are normal.  Pulmonary/Chest: Effort normal.  Neurological: He is alert and oriented to person, place, and time.  Skin: Skin is warm and dry.  Psychiatric: He has a normal mood and affect. His behavior is normal.    Ortho Exam left knee without effusion.  Increased varus.  Some patellar crepitation.  Predominant medial joint pain.  No instability.  No calf pain.  No distal edema Specialty Comments:  No specialty comments available.  Imaging: No results found.   PMFS History: Patient Active Problem List   Diagnosis Date Noted  . Osteoarthritis 11/21/2017  . Hyperlipidemia 11/21/2017  . Low testosterone 09/26/2016  . Healthcare maintenance 09/26/2016  . S/P total knee replacement using cement, right 06/01/2016  . Sleep apnea 05/19/2016  . Hypertension 05/19/2016   Past Medical History:  Diagnosis Date  . Arthritis   . Deep vein  thrombosis ([place])    after knee surgery in 2004  . GERD (gastroesophageal reflux disease)   . Hypertension   . Murmur, cardiac   . Sleep apnea    CPAP    Family History  Problem Relation Age of Onset  . Alzheimer's disease Father   . Colon cancer Neg Hx   . Esophageal cancer Neg Hx   . Rectal cancer Neg Hx   . Stomach cancer Neg Hx     Past Surgical History:  Procedure Laterality Date  . CERVICAL SPINE SURGERY N/A 1990   C5-6 discectomy  . FINGER SURGERY Right 2008   x 3 index finger  . KNEE ARTHROSCOPY  Bilateral    L 2004, R 2005  . MASS EXCISION Right 01/18/2017   Procedure: EXCISION BENIGN RIGHT NECK MASS;  Surgeon: Rozetta Nunnery, MD;  Location: [facility];  Service: ENT;  Laterality: Right;  . 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 TOTAL KNEE ARTHROPLASTY Right 06/01/2016   Procedure: TOTAL KNEE ARTHROPLASTY;  Surgeon: [name], MD;  Location: [facility];  Service: Orthopedics;  Laterality: Right;   Social History   Occupational History  . Not on file  Tobacco Use  . Smoking status: Never Smoker  . Smokeless tobacco: Never Used  Substance and Sexual Activity  . Alcohol use: Yes    Comment: rarely  . Drug use: No  . Sexual activity: Not on file

## 2018-03-30 ENCOUNTER — Other Ambulatory Visit: Payer: Self-pay | Admitting: Internal Medicine

## 2018-03-30 MED FILL — LOSARTAN-HCTZ 100-25 MG TAB: 100-25 | 90 days supply | Qty: 90 | Fill #0

## 2018-04-26 MED FILL — AMOXICILLIN 500 MG CAPSULE: 500 | 3 days supply | Qty: 12 | Fill #0

## 2018-06-12 MED FILL — AMOXICILLIN 500 MG CAPSULE: 500 | 3 days supply | Qty: 12 | Fill #1

## 2018-06-13 ENCOUNTER — Encounter (INDEPENDENT_AMBULATORY_CARE_PROVIDER_SITE_OTHER): Payer: Self-pay | Admitting: Orthopaedic Surgery

## 2018-06-13 ENCOUNTER — Ambulatory Visit (INDEPENDENT_AMBULATORY_CARE_PROVIDER_SITE_OTHER): Payer: 59

## 2018-06-13 ENCOUNTER — Ambulatory Visit (INDEPENDENT_AMBULATORY_CARE_PROVIDER_SITE_OTHER): Payer: 59 | Admitting: Orthopaedic Surgery

## 2018-06-13 VITALS — BP 179/94 | HR 74 | Ht 69.0 in | Wt 235.0 lb

## 2018-06-13 DIAGNOSIS — M25561 Pain in right knee: Secondary | ICD-10-CM

## 2018-06-13 DIAGNOSIS — M5442 Lumbago with sciatica, left side: Secondary | ICD-10-CM

## 2018-06-13 DIAGNOSIS — W108XXA Fall (on) (from) other stairs and steps, initial encounter: Secondary | ICD-10-CM

## 2018-06-13 DIAGNOSIS — Z96651 Presence of right artificial knee joint: Secondary | ICD-10-CM

## 2018-06-13 MED ORDER — TRAMADOL HCL 50 MG PO TABS: 50.0000 mg | ORAL_TABLET | Freq: Four times a day (QID) | ORAL | 0 refills | Status: DC | PRN

## 2018-06-13 MED ORDER — DOXYCYCLINE HYCLATE 100 MG PO TABS: 100.0000 mg | ORAL_TABLET | Freq: Two times a day (BID) | ORAL | 0 refills | Status: DC

## 2018-06-13 MED ORDER — METHYLPREDNISOLONE 4 MG PO TBPK: ORAL_TABLET | ORAL | 0 refills | Status: DC

## 2018-06-13 MED FILL — traMADol HCL 50 MG TABS: 50 | 8 days supply | Qty: 30 | Fill #0

## 2018-06-13 MED FILL — METHYLPREDNISOLONE 4 MG TAB: 4 | 6 days supply | Qty: 21 | Fill #0

## 2018-06-13 MED FILL — DOXYCYCLINE HYCLATE 100 MG: 100 | 10 days supply | Qty: 20 | Fill #0

## 2018-06-13 NOTE — Progress Notes (Addendum)
Office Visit Note   Patient: James Gallegos           Date of Birth: 1950-06-24           MRN: [id number] Visit Date: 06/13/2018              Requested by: Aldine Contes, MD [address] [place], [place] [address] PCP: Aldine Contes, MD   Assessment & Plan: Visit Diagnoses:  1. Acute left-sided low back pain with left-sided sciatica   2. Right knee pain, unspecified chronicity   3. S/P total knee replacement using cement, right   4. Fall (on) (from) other stairs and steps, initial encounter     Plan:  #1: Medrol Dosepak for the lumbar spine and radiculopathy #2: Tramadol for pain #3: Doxycycline for the right great toe infection #4: Warm saline soaks to the right foot 3-4 times a day call in the interim if he does not have a benefit from the Doxy or worsening pain.   Follow-Up Instructions: Return in about 1 week (around 06/20/2018).   Face-to-face time spent with patient was greater than 30 minutes.  Greater than 50% of the time was spent in counseling and coordination of care.  Orders:  Orders Placed This Encounter  Procedures  . XR HIP UNILAT W OR W/O PELVIS 2-3 VIEWS LEFT  . XR Lumbar Spine 2-3 Views  . XR KNEE 3 VIEW LEFT   Meds ordered this encounter  Medications  . methylPREDNISolone (MEDROL DOSEPAK) 4 MG TBPK tablet    Sig: As directed    Dispense:  21 tablet    Refill:  0    Order Specific Question:   Supervising Provider    Answer:   [name] [[id number]]  . doxycycline (VIBRA-TABS) 100 MG tablet    Sig: Take 1 tablet (100 mg total) by mouth 2 (two) times daily.    Dispense:  20 tablet    Refill:  0    Order Specific Question:   Supervising Provider    Answer:   [name] [[id number]]  . traMADol (ULTRAM) 50 MG tablet    Sig: Take 1 tablet (50 mg total) by mouth every 6 (six) hours as needed.    Dispense:  30 tablet    Refill:  0    Order Specific Question:   Supervising Provider    Answer:   [name] [[id number]]       Procedures: No procedures performed   Clinical Data: No additional findings.   Subjective: Chief Complaint  Patient presents with  . Right Knee - Follow-up, Pain  . Lower Back - Injury, Pain  . Left Knee - Pain    HPI  James Gallegos is a very pleasant 67 year old white male who is seen today for evaluation of a recent fall.  Apparently last Friday he was at his home in his office working when several dogs who are in cages out in his garage start to fight and he went down to see them and try to stop them.  However he did recently been given new progressive lenses and he was having problems seeing he missed the first step and fell forward down 4 stairs and then onto his left side.  He is complaining now of pain in his left buttock and low back.  Some pain in his left knee.  He does have some radicular type symptoms along the posterior lateral aspect of the left leg.  Nothing on the right  at this time.  Denies any bowel or bladder incontinence.  He states he does have some right knee pain intermittently but certainly is much improved from preop.  Review of Systems  Constitutional: Negative for fatigue and fever.  HENT: Negative for ear pain.   Eyes: Negative for pain.  Respiratory: Negative for cough and shortness of breath.   Cardiovascular: Negative for leg swelling.  Gastrointestinal: Negative for constipation and diarrhea.  Genitourinary: Negative for difficulty urinating.  Musculoskeletal: Negative for back pain and neck pain.  Skin: Negative for rash.  Neurological: Positive for weakness. Negative for numbness.  Hematological: Does not bruise/bleed easily.  Psychiatric/Behavioral: Positive for sleep disturbance.     Objective: Vital Signs: BP (!) 179/94   Pulse 74   Ht 5\' 9"  (1.753 m)   Wt 235 lb (106.6 kg)   BMI 34.70 kg/m   Physical Exam Constitutional:      Appearance: He is well-developed.  Eyes:     Pupils: Pupils are equal, round, and reactive to light.   Pulmonary:     Effort: Pulmonary effort is normal.  Skin:    General: Skin is warm and dry.  Neurological:     Mental Status: He is alert and oriented to person, place, and time.  Psychiatric:        Behavior: Behavior normal.     Ortho Exam  The left knee today reveals range of motion from lacking a few degrees of full extension to about 90 to 95 degrees.  Crepitance with range of motion.  Trace effusion to 1+ .  Pseudolaxity with valgus stressing opening with a good endpoint medially.  He does have low back pain and left SI joint pain to palpation.  Positive straight leg raise on the left.  Pain exhibited more along laterally in the thigh and calf.  He deep tendon reflexes 2+ in the knee absent in the ankles bilateral and symmetric.  Sensation is intact but decreased posteriorly bilaterally in the calf.  Good strength throughout with testing.  Right knee has a well-healed surgical incision.  Trace at best effusion.  Ligamentously stable.  Specialty Comments:  No specialty comments available.  Imaging: Xr Hip Unilat W Or W/o Pelvis 2-3 Views Left  Result Date: 06/13/2018 2 view x-ray AP pelvis and the left hip reveals some degenerative changes in the left hip with some superior joint space narrowing.  May have some cystic changes in the superior and posterior aspect of the acetabulum.  AP pelvis does reveal some SI joint space DJD and sclerosing.  Inferior spurring of the SI joint is also noted.  Xr Knee 3 View Left  Result Date: 06/13/2018 Three-view knee of the left reveals end-stage OA with medial bone-on-bone.  There is periarticular spurring diffusely through the patellofemoral as well as the medial lateral joint lines.  Peaked intercondylar spines are noted.  Lateral positioning of the patella with marked the lateral patellofemoral joint space narrowing.  Xr Lumbar Spine 2-3 Views  Result Date: 06/13/2018 2 view x-ray of the lumbar spine reveals marked degenerative disc  disease with spurring.  He does have mild scoliosis noted.  SI joint narrowing and inferior spurring continues to be seen.  L1 on 2 retrolisthesis.  Disc base narrowing of markedly at L2-3 L3-4 L45.  Anterior spurring noted L1-L5.  At L5-S1 does have a joint space noted.  Marked facet arthritis noted.    PMFS History: Patient Active Problem List   Diagnosis Date Noted  . Osteoarthritis 11/21/2017  .  Hyperlipidemia 11/21/2017  . Low testosterone 09/26/2016  . Healthcare maintenance 09/26/2016  . S/P total knee replacement using cement, right 06/01/2016  . Sleep apnea 05/19/2016  . Hypertension 05/19/2016   Past Medical History:  Diagnosis Date  . Arthritis   . Deep vein thrombosis ([place])    after knee surgery in 2004  . GERD (gastroesophageal reflux disease)   . Hypertension   . Murmur, cardiac   . Sleep apnea    CPAP    Family History  Problem Relation Age of Onset  . Alzheimer's disease Father   . Colon cancer Neg Hx   . Esophageal cancer Neg Hx   . Rectal cancer Neg Hx   . Stomach cancer Neg Hx     Past Surgical History:  Procedure Laterality Date  . CERVICAL SPINE SURGERY N/A 1990   C5-6 discectomy  . FINGER SURGERY Right 2008   x 3 index finger  . KNEE ARTHROSCOPY Bilateral    L 2004, R 2005  . MASS EXCISION Right 01/18/2017   Procedure: EXCISION BENIGN RIGHT NECK MASS;  Surgeon: Rozetta Nunnery, MD;  Location: [facility];  Service: ENT;  Laterality: Right;  . 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 TOTAL KNEE ARTHROPLASTY Right 06/01/2016   Procedure: TOTAL KNEE ARTHROPLASTY;  Surgeon: [name], MD;  Location: [facility];  Service: Orthopedics;  Laterality: Right;   Social History   Occupational History  . Not on file  Tobacco Use  . Smoking status: Never Smoker  . Smokeless tobacco: Never Used  Substance and Sexual Activity  . Alcohol use: Yes    Comment: rarely  . Drug use: No  . Sexual activity: Not on file      Pt  stated Right knee --still having pain  Also, pt injured Lt side lower back having a lot of pain --fell  4 steps down last friday

## 2018-06-22 ENCOUNTER — Telehealth (INDEPENDENT_AMBULATORY_CARE_PROVIDER_SITE_OTHER): Payer: Self-pay | Admitting: Orthopaedic Surgery

## 2018-06-22 ENCOUNTER — Other Ambulatory Visit (INDEPENDENT_AMBULATORY_CARE_PROVIDER_SITE_OTHER): Payer: Self-pay | Admitting: *Deleted

## 2018-06-22 ENCOUNTER — Encounter (INDEPENDENT_AMBULATORY_CARE_PROVIDER_SITE_OTHER): Payer: Self-pay | Admitting: Orthopaedic Surgery

## 2018-06-22 ENCOUNTER — Ambulatory Visit (INDEPENDENT_AMBULATORY_CARE_PROVIDER_SITE_OTHER): Payer: 59 | Admitting: Orthopaedic Surgery

## 2018-06-22 VITALS — BP 165/72 | HR 64 | Wt 235.0 lb

## 2018-06-22 DIAGNOSIS — M5442 Lumbago with sciatica, left side: Secondary | ICD-10-CM

## 2018-06-22 DIAGNOSIS — M79675 Pain in left toe(s): Secondary | ICD-10-CM

## 2018-06-22 DIAGNOSIS — M79674 Pain in right toe(s): Secondary | ICD-10-CM

## 2018-06-22 DIAGNOSIS — M79671 Pain in right foot: Secondary | ICD-10-CM | POA: Diagnosis not present

## 2018-06-22 MED ORDER — DOXYCYCLINE HYCLATE 100 MG PO CAPS: 100.0000 mg | ORAL_CAPSULE | Freq: Two times a day (BID) | ORAL | 0 refills | Status: DC

## 2018-06-22 MED ORDER — DOXYCYCLINE HYCLATE 50 MG PO CAPS: 50.0000 mg | ORAL_CAPSULE | Freq: Two times a day (BID) | ORAL | 0 refills | Status: DC

## 2018-06-22 MED FILL — DOXYCYCLINE HYC 100 MG CAPS: 100 | 5 days supply | Qty: 10 | Fill #0

## 2018-06-22 NOTE — Telephone Encounter (Signed)
Pharmacy calling because they received two rxs for Doxycycline, one for 50mg , and one for 100mg . Please call to clarify which rx to be given.

## 2018-06-22 NOTE — Progress Notes (Signed)
Office Visit Note   Patient: James Gallegos           Date of Birth: 29-Nov-1950           MRN: [id number] Visit Date: 06/22/2018              Requested by: Aldine Contes, MD [address] [place], [place] [address] PCP: Aldine Contes, MD   Assessment & Plan: Visit Diagnoses:  1. Acute left-sided low back pain with left-sided sciatica   2. Right foot pain     Plan: Feeling much better in terms of his back and left lower extremity after the Medrol Dosepak.  No bowel or bladder changes.  No loss of feeling.  Will elect to perform exercises and take NSAIDs.  Office 3 to 4 weeks if no improvement or sooner if there is an exacerbation.  Had a paronychia of the right great toe treated with doxycycline.  He now has developed a blister beneath the nail.  I will decompress this apply me appropriate Senden Band-Aid and have him soak it in epsom salts twice a day for the next several days.  There is no evidence of an active infection  Follow-Up Instructions: Return if symptoms worsen or fail to improve.   Orders:  No orders of the defined types were placed in this encounter.  Meds ordered this encounter  Medications  . doxycycline (VIBRAMYCIN) 50 MG capsule    Sig: Take 1 capsule (50 mg total) by mouth 2 (two) times daily.    Dispense:  10 capsule    Refill:  0    Order Specific Question:   Supervising Provider    Answer:   [name] [[id number]]  . doxycycline (VIBRAMYCIN) 100 MG capsule    Sig: Take 1 capsule (100 mg total) by mouth 2 (two) times daily.    Dispense:  10 capsule    Refill:  0    Order Specific Question:   Supervising Provider    Answer:   [name] [[id number]]      Procedures: No procedures performed   Clinical Data: No additional findings.   Subjective: Chief Complaint  Patient presents with  . Lower Back - Follow-up  . Follow-up    pt stated low back lt side doing little better, but still having constant pain back but  not the leg.  [name] was seen last week for evaluation of relatively  acute onset of low back pain associated with left lower extremity radiculopathy.  He has had significant relief of his pain with the prednisone Dosepak I also placed him on doxycycline for an early onychia of his right great toenail.  No bowel or bladder dysfunction  HPI  Review of Systems  Constitutional: Negative.   HENT: Negative.   Eyes: Negative.   Respiratory: Negative.   Cardiovascular: Negative.   Gastrointestinal: Negative.   Endocrine: Negative.   Genitourinary: Negative.   Musculoskeletal: Positive for back pain.  Skin: Negative.   Allergic/Immunologic: Negative.   Neurological: Negative.   Hematological: Negative.   Psychiatric/Behavioral: Negative.      Objective: Vital Signs: BP (!) 165/72 (BP Location: Left Arm, Patient Position: Sitting, Cuff Size: Normal)   Pulse 64   Wt 235 lb (106.6 kg)   BMI 34.70 kg/m   Physical Exam Constitutional:      Appearance: He is well-developed.  Eyes:     Pupils: Pupils are equal, round, and reactive to light.  Pulmonary:  Effort: Pulmonary effort is normal.  Skin:    General: Skin is warm and dry.  Neurological:     Mental Status: He is alert and oriented to person, place, and time.  Psychiatric:        Behavior: Behavior normal.     Ortho Exam right great toe with a blister beneath the nail.  I decompressed this with immediate relief of his pain.  The fluid was clear.  Apply ibuprofen and Band-Aids and will have him continue to soak his toe.  I will also have him continue with the doxycycline for another 5 days.  Straight leg raise is negative painless range of motion both hips.  No percussible tenderness of lumbar spine.  Neurologically intact.  Reflexes are symmetrical.  Specialty Comments:  No specialty comments available.  Imaging: No results found.   PMFS History: Patient Active Problem List   Diagnosis Date Noted  . Right foot pain  06/22/2018  . Acute left-sided low back pain with left-sided sciatica 06/22/2018  . Osteoarthritis 11/21/2017  . Hyperlipidemia 11/21/2017  . Low testosterone 09/26/2016  . Healthcare maintenance 09/26/2016  . S/P total knee replacement using cement, right 06/01/2016  . Sleep apnea 05/19/2016  . Hypertension 05/19/2016   Past Medical History:  Diagnosis Date  . Arthritis   . Deep vein thrombosis ([place])    after knee surgery in 2004  . GERD (gastroesophageal reflux disease)   . Hypertension   . Murmur, cardiac   . Sleep apnea    CPAP    Family History  Problem Relation Age of Onset  . Alzheimer's disease Father   . Colon cancer Neg Hx   . Esophageal cancer Neg Hx   . Rectal cancer Neg Hx   . Stomach cancer Neg Hx     Past Surgical History:  Procedure Laterality Date  . CERVICAL SPINE SURGERY N/A 1990   C5-6 discectomy  . FINGER SURGERY Right 2008   x 3 index finger  . KNEE ARTHROSCOPY Bilateral    L 2004, R 2005  . MASS EXCISION Right 01/18/2017   Procedure: EXCISION BENIGN RIGHT NECK MASS;  Surgeon: Rozetta Nunnery, MD;  Location: [facility];  Service: ENT;  Laterality: Right;  . 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 TOTAL KNEE ARTHROPLASTY Right 06/01/2016   Procedure: TOTAL KNEE ARTHROPLASTY;  Surgeon: [name], MD;  Location: [facility];  Service: Orthopedics;  Laterality: Right;   Social History   Occupational History  . Not on file  Tobacco Use  . Smoking status: Never Smoker  . Smokeless tobacco: Never Used  Substance and Sexual Activity  . Alcohol use: Yes    Comment: rarely  . Drug use: No  . Sexual activity: Not on file

## 2018-06-23 NOTE — Telephone Encounter (Signed)
Per Dr. Durward Fortes, 100 mg, I called pharmacy

## 2018-06-26 MED FILL — LOSARTAN-HCTZ 100-25 MG TAB: 100-25 | 90 days supply | Qty: 90 | Fill #1

## 2018-06-27 ENCOUNTER — Telehealth (INDEPENDENT_AMBULATORY_CARE_PROVIDER_SITE_OTHER): Payer: Self-pay | Admitting: Radiology

## 2018-06-27 NOTE — Telephone Encounter (Signed)
[facility] left message on triage voicemail regarding this patient.  They did not give a reference as to what it is about.  They would like a call back @ [phone]. Referance # [id number]

## 2018-06-27 NOTE — Telephone Encounter (Signed)
Dr. Durward Fortes is aware of patient's results

## 2018-06-28 ENCOUNTER — Telehealth (INDEPENDENT_AMBULATORY_CARE_PROVIDER_SITE_OTHER): Payer: Self-pay | Admitting: Orthopaedic Surgery

## 2018-06-28 NOTE — Telephone Encounter (Signed)
Surgery sheet on your desk. Please advise regarding dental implant.

## 2018-06-28 NOTE — Telephone Encounter (Signed)
James Gallegos, James Gallegos should have the dental implant prior to surgery

## 2018-06-28 NOTE — Telephone Encounter (Signed)
Patient has decided to hold off on dental implant, and proceed with knee surgery. Please proceed with scheduling.

## 2018-06-28 NOTE — Telephone Encounter (Signed)
Patient will be losing his insurance in April, and would like to go ahead and schedule surgery for his Lt Knee. Patient would like to schedule for after the week of Feb.10th. Patient also needs a dental implant, and wants to know if he should wait until after knee surgery? Please call to advise.

## 2018-06-28 NOTE — Telephone Encounter (Signed)
FYI

## 2018-06-28 NOTE — Telephone Encounter (Signed)
I called patient 

## 2018-06-29 LAB — ANAEROBIC AND AEROBIC CULTURE
MICRO NUMBER:: 33577
MICRO NUMBER:: 33578
SPECIMEN QUALITY:: ADEQUATE
SPECIMEN QUALITY:: ADEQUATE

## 2018-06-29 NOTE — Telephone Encounter (Signed)
Surgery sheet completed

## 2018-06-29 NOTE — Telephone Encounter (Signed)
Copy sent to Myer Haff

## 2018-07-05 ENCOUNTER — Encounter (INDEPENDENT_AMBULATORY_CARE_PROVIDER_SITE_OTHER): Payer: Self-pay

## 2018-07-13 ENCOUNTER — Encounter (INDEPENDENT_AMBULATORY_CARE_PROVIDER_SITE_OTHER): Payer: Self-pay | Admitting: Orthopedic Surgery

## 2018-07-13 ENCOUNTER — Ambulatory Visit (INDEPENDENT_AMBULATORY_CARE_PROVIDER_SITE_OTHER): Payer: 59 | Admitting: Orthopedic Surgery

## 2018-07-13 VITALS — BP 151/68 | HR 67 | Temp 97.9°F | Resp 16 | Ht 69.25 in | Wt 220.0 lb

## 2018-07-13 DIAGNOSIS — M1712 Unilateral primary osteoarthritis, left knee: Secondary | ICD-10-CM

## 2018-07-13 NOTE — Pre-Procedure Instructions (Signed)
James Gallegos  07/13/2018      [place], [place] - [address] [facility]. [address] [place] [address] Phone: [phone] Fax: [phone]    Your procedure is scheduled on Tuesday, Feb. 11th   Report to [facility] Admitting at 5:30 AM             (posted surgery time 7:30a - 9:40a)   Call this number if you have problems the morning of surgery:  [phone]   Remember:   Do not eat any foods or drink any liquids after midnight, Monday.              7 days prior to surgery, STOP TAKING any Vitamins, Herbal Supplements, Anti-inflammatories, Blood Thinners.    Take these medicines the morning of surgery with A SIP OF WATER : None    Do not wear jewelry - NO RINGS  Do not wear lotions, colognes or deodorant.             Men may shave face and neck.  Do not bring valuables to the hospital.  [facility] is not responsible for any belongings or valuables.  Contacts, dentures or bridgework may not be worn into surgery.  Leave your suitcase in the car.  After surgery it may be brought to your room.  For patients admitted to the hospital, discharge time will be determined by your treatment team.  Please read over the following fact sheets that you were given. MRSA Information and Surgical Site Infection Prevention       [facility]- Preparing For Surgery  Before surgery, you can play an important role. Because skin is not sterile, your skin needs to be as free of germs as possible. You can reduce the number of germs on your skin by washing with CHG (chlorahexidine gluconate) Soap before surgery.  CHG is an antiseptic cleaner which kills germs and bonds with the skin to continue killing germs even after washing.    Oral Hygiene is also important to reduce your risk of infection.    Remember - BRUSH YOUR TEETH THE MORNING OF SURGERY WITH YOUR REGULAR TOOTHPASTE  Please do not use if you have an allergy to CHG  or antibacterial soaps. If your skin becomes reddened/irritated stop using the CHG.  Do not shave (including legs and underarms) for at least 48 hours prior to first CHG shower. It is OK to shave your face.  Please follow these instructions carefully.   1. Shower the NIGHT BEFORE SURGERY and the MORNING OF SURGERY with CHG.   2. If you chose to wash your hair, wash your hair first as usual with your normal shampoo.  3. After you shampoo, rinse your hair and body thoroughly to remove the shampoo.  4. Use CHG as you would any other liquid soap. You can apply CHG directly to the skin and wash gently with a scrungie or a clean washcloth.   5. Apply the CHG Soap to your body ONLY FROM THE NECK DOWN.  Do not use on open wounds or open sores. Avoid contact with your eyes, ears, mouth and genitals (private parts). Wash Face and genitals (private parts)  with your normal soap.  6. Wash thoroughly, paying special attention to the area where your surgery will be performed.  7. Thoroughly rinse your body with warm water from the neck down.  8. DO NOT shower/wash with your normal soap after using and rinsing  off the CHG Soap.  9. Pat yourself dry with a CLEAN TOWEL.  10. Wear CLEAN PAJAMAS to bed the night before surgery, wear comfortable clothes the morning of surgery  11. Place CLEAN SHEETS on your bed the night of your first shower and DO NOT SLEEP WITH PETS.  Day of Surgery  Do not apply any deodorants/lotions.  Please wear clean clothes to the hospital/surgery center.    Remember to brush your teeth WITH YOUR REGULAR TOOTHPASTE.

## 2018-07-13 NOTE — Progress Notes (Signed)
Office Visit Note   Patient: James Gallegos           Date of Birth: 14-Jul-1950           MRN: [id number] Visit Date: 07/13/2018              Requested by: Aldine Contes, MD [address], [place], [place] [address] PCP: Aldine Contes, MD Chief Complaint:left knee pain.  HPI: James Gallegos, 69 y.o. male, has a history of pain and functional disability in the left knee due to arthritis and has failed non-surgical conservative treatments for greater than 12 weeks to includeNSAID's and/or analgesics, corticosteriod injections, flexibility and strengthening excercises, weight reduction as appropriate and activity modification.  Onset of symptoms was gradual, starting 2 years ago with gradually worsening course since that time. The patient noted prior procedures on the knee to include  arthroscopy and menisectomy on the left knee(s).  Patient currently rates pain in the left knee(s) at 7 out of 10 with activity. Patient has night pain, worsening of pain with activity and weight bearing, pain that interferes with activities of daily living, pain with passive range of motion and joint swelling.  Patient has evidence of subchondral sclerosis, periarticular osteophytes, joint subluxation and joint space narrowing by imaging studies. This patient has had . There is no active infection.  Patient Active Problem List   Diagnosis Date Noted  . Right foot pain 06/22/2018  . Acute left-sided low back pain with left-sided sciatica 06/22/2018  . Osteoarthritis 11/21/2017  . Hyperlipidemia 11/21/2017  . Low testosterone 09/26/2016  . Healthcare maintenance 09/26/2016  . S/P total knee replacement using cement, right 06/01/2016  . Sleep apnea 05/19/2016  . Hypertension 05/19/2016   Past Medical History:  Diagnosis Date  . Arthritis   . Deep vein thrombosis ([place])    after knee surgery in 2004  . GERD (gastroesophageal reflux disease)   . Hypertension   . Murmur, cardiac   .  Sleep apnea    CPAP    Past Surgical History:  Procedure Laterality Date  . CERVICAL SPINE SURGERY N/A 1990   C5-6 discectomy  . FINGER SURGERY Right 2008   x 3 index finger  . KNEE ARTHROPLASTY    . KNEE ARTHROSCOPY Bilateral    L 2004, R 2005  . MASS EXCISION Right 01/18/2017   Procedure: EXCISION BENIGN RIGHT NECK MASS;  Surgeon: Rozetta Nunnery, MD;  Location: [facility];  Service: ENT;  Laterality: Right;  . NASAL FRACTURE SURGERY    . 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 TOTAL KNEE ARTHROPLASTY Right 06/01/2016   Procedure: TOTAL KNEE ARTHROPLASTY;  Surgeon: [name], MD;  Location: [facility];  Service: Orthopedics;  Laterality: Right;    No current facility-administered medications for this encounter.    Current Outpatient Medications  Medication Sig Dispense Refill Last Dose  . Ascorbic Acid (VITAMIN C) 1000 MG tablet Take 5,000 mg by mouth daily.   Taking  . B Complex-C (B-COMPLEX WITH VITAMIN C) tablet Take 1 tablet by mouth daily.   Taking  . Cholecalciferol (VITAMIN D-3) 5000 units TABS Take 5,000 Units by mouth daily.   Taking  . Cyanocobalamin (VITAMIN B-12) 5000 MCG TBDP Take 5,000 mcg by mouth daily.   Taking  . DHEA 50 MG CAPS Take 100 mg by mouth daily.   Taking  . Diindolylmethane POWD Take 250 mg by mouth daily.   Taking  . Krill Oil 500  MG CAPS Take 500 mg by mouth daily.   Taking  . losartan-hydrochlorothiazide (HYZAAR) 100-25 MG tablet TAKE 1 TABLET BY MOUTH DAILY. 90 tablet 1 Taking  . Lysine HCl 1000 MG TABS Take 2,000 mg by mouth daily.   Taking  . QUERCETIN PO Take 1,000 mg by mouth daily.   Taking  . saw palmetto 160 MG capsule Take 320 mg by mouth daily.   Taking  . amoxicillin (AMOXIL) 500 MG capsule DENTAL PROCEDURES   Taking  . ibuprofen (ADVIL,MOTRIN) 600 MG tablet Take 600 mg by mouth every 6 (six) hours as needed.   Taking  . Magnesium 400 MG TABS Take by mouth.   Taking  . traMADol (ULTRAM) 50 MG tablet Take 1  tablet (50 mg total) by mouth every 6 (six) hours as needed. (Patient not taking: Reported on 07/10/2018) 30 tablet 0 Not Taking   No Known Allergies  Social History   Tobacco Use  . Smoking status: Never Smoker  . Smokeless tobacco: Never Used  Substance Use Topics  . Alcohol use: Yes    Comment: rarely    Family History  Problem Relation Age of Onset  . Alzheimer's disease Father   . Colon cancer Neg Hx   . Esophageal cancer Neg Hx   . Rectal cancer Neg Hx   . Stomach cancer Neg Hx    Review of Systems  Constitutional: Negative for fatigue.  HENT: Negative for trouble swallowing.   Eyes: Negative for pain.  Respiratory: Negative for wheezing.   Cardiovascular: Negative for chest pain.  Gastrointestinal: Negative for constipation.  Endocrine: Negative for cold intolerance.  Genitourinary: Negative for difficulty urinating.  Musculoskeletal: Positive for joint swelling.  Skin: Negative for rash.  Allergic/Immunologic: Negative for food allergies.  Neurological: Negative for weakness.  Hematological: Does not bruise/bleed easily.  Psychiatric/Behavioral: Positive for sleep disturbance.    Objective:  Physical Exam  Constitutional: He is oriented to person, place, and time. He appears well-developed and well-nourished.  HENT:  Head: Normocephalic and atraumatic.  Eyes: Pupils are equal, round, and reactive to light. Conjunctivae and EOM are normal.  Neck: Neck supple. No thyromegaly present.  Cardiovascular: Normal rate, regular rhythm, normal heart sounds and intact distal pulses.  No murmur heard. Respiratory: Effort normal and breath sounds normal.  GI: Soft. Bowel sounds are normal.  Neurological: He is alert and oriented to person, place, and time.  Skin: Skin is warm and dry.  Psychiatric: He has a normal mood and affect. His behavior is normal. Judgment and thought content normal.    Vital signs in last 24 hours: Temp:  [97.9 F (36.6 C)] 97.9 F (36.6 C)  (01/30 1206) Pulse Rate:  [67] 67 (01/30 1206) Resp:  [16] 16 (01/30 1206) BP: (151)/(68) 151/68 (01/30 1206) Weight:  [99.8 kg] 99.8 kg (01/30 1206)  Labs:   Estimated body mass index is 32.25 kg/m as calculated from the following:   Height as of 07/13/18: 5' 9.25" (1.759 m).   Weight as of 07/13/18: 99.8 kg.   Imaging Review Plain radiographs demonstrate moderate degenerative joint disease of the left knee(s). The overall alignment issignificant varus. The bone quality appears to be good for age and reported activity level.   Preoperative templating of the joint replacement has been completed, documented, and submitted to the Operating Room personnel in order to optimize intra-operative equipment management.   Anticipated LOS equal to or greater than 2 midnights due to - Age 32 and older with one or  more of the following:  - Obesity  - Expected need for hospital services (PT, OT, Nursing) required for safe  discharge  - Anticipated need for postoperative skilled nursing care or inpatient rehab  - Active co-morbidities: None OR   - Unanticipated findings during/Post Surgery: None  - Patient is a high risk of re-admission due to: None     Assessment/Plan:  End stage arthritis, left knee   The patient history, physical examination, clinical judgment of the provider and imaging studies are consistent with end stage degenerative joint disease of the left knee(s) and total knee arthroplasty is deemed medically necessary. The treatment options including medical management, injection therapy arthroscopy and arthroplasty were discussed at length. The risks and benefits of total knee arthroplasty were presented and reviewed. The risks due to aseptic loosening, infection, stiffness, patella tracking problems, thromboembolic complications and other imponderables were discussed. The patient acknowledged the explanation, agreed to proceed with the plan and consent was signed. Patient is  being admitted for inpatient treatment for surgery, pain control, PT, OT, prophylactic antibiotics, VTE prophylaxis, progressive ambulation and ADL's and discharge planning. The patient is planning to be discharged home with home health services  Face-to-face time spent with patient was greater than 40 minutes.  Greater than 50% of the time was spent in counseling and coordination of care.    Mike Craze [place], [place] [phone]  07/13/2018 12:58 PM

## 2018-07-13 NOTE — H&P (Signed)
TOTAL KNEE ADMISSION H&P  Patient is being admitted for left total knee arthroplasty.  Subjective:  Chief Complaint:left knee pain.  HPI: James Gallegos, 68 y.o. male, has a history of pain and functional disability in the left knee due to arthritis and has failed non-surgical conservative treatments for greater than 12 weeks to includeNSAID's and/or analgesics, corticosteriod injections, flexibility and strengthening excercises, weight reduction as appropriate and activity modification.  Onset of symptoms was gradual, starting 2 years ago with gradually worsening course since that time. The patient noted prior procedures on the knee to include  arthroscopy and menisectomy on the left knee(s).  Patient currently rates pain in the left knee(s) at 7 out of 10 with activity. Patient has night pain, worsening of pain with activity and weight bearing, pain that interferes with activities of daily living, pain with passive range of motion and joint swelling.  Patient has evidence of subchondral sclerosis, periarticular osteophytes, joint subluxation and joint space narrowing by imaging studies. This patient has had . There is no active infection.  Patient Active Problem List   Diagnosis Date Noted  . Right foot pain 06/22/2018  . Acute left-sided low back pain with left-sided sciatica 06/22/2018  . Osteoarthritis 11/21/2017  . Hyperlipidemia 11/21/2017  . Low testosterone 09/26/2016  . Healthcare maintenance 09/26/2016  . S/P total knee replacement using cement, right 06/01/2016  . Sleep apnea 05/19/2016  . Hypertension 05/19/2016   Past Medical History:  Diagnosis Date  . Arthritis   . Deep vein thrombosis ([place])    after knee surgery in 2004  . GERD (gastroesophageal reflux disease)   . Hypertension   . Murmur, cardiac   . Sleep apnea    CPAP    Past Surgical History:  Procedure Laterality Date  . CERVICAL SPINE SURGERY N/A 1990   C5-6 discectomy  . FINGER SURGERY Right 2008   x 3  index finger  . KNEE ARTHROPLASTY    . KNEE ARTHROSCOPY Bilateral    L 2004, R 2005  . MASS EXCISION Right 01/18/2017   Procedure: EXCISION BENIGN RIGHT NECK MASS;  Surgeon: Rozetta Nunnery, MD;  Location: [facility];  Service: ENT;  Laterality: Right;  . NASAL FRACTURE SURGERY    . 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 TOTAL KNEE ARTHROPLASTY Right 06/01/2016   Procedure: TOTAL KNEE ARTHROPLASTY;  Surgeon: [name], MD;  Location: [facility];  Service: Orthopedics;  Laterality: Right;    No current facility-administered medications for this encounter.    Current Outpatient Medications  Medication Sig Dispense Refill Last Dose  . Ascorbic Acid (VITAMIN C) 1000 MG tablet Take 5,000 mg by mouth daily.   Taking  . B Complex-C (B-COMPLEX WITH VITAMIN C) tablet Take 1 tablet by mouth daily.   Taking  . Cholecalciferol (VITAMIN D-3) 5000 units TABS Take 5,000 Units by mouth daily.   Taking  . Cyanocobalamin (VITAMIN B-12) 5000 MCG TBDP Take 5,000 mcg by mouth daily.   Taking  . DHEA 50 MG CAPS Take 100 mg by mouth daily.   Taking  . Diindolylmethane POWD Take 250 mg by mouth daily.   Taking  . Krill Oil 500 MG CAPS Take 500 mg by mouth daily.   Taking  . losartan-hydrochlorothiazide (HYZAAR) 100-25 MG tablet TAKE 1 TABLET BY MOUTH DAILY. 90 tablet 1 Taking  . Lysine HCl 1000 MG TABS Take 2,000 mg by mouth daily.   Taking  . QUERCETIN PO Take 1,000 mg by mouth  daily.   Taking  . saw palmetto 160 MG capsule Take 320 mg by mouth daily.   Taking  . amoxicillin (AMOXIL) 500 MG capsule DENTAL PROCEDURES   Taking  . ibuprofen (ADVIL,MOTRIN) 600 MG tablet Take 600 mg by mouth every 6 (six) hours as needed.   Taking  . Magnesium 400 MG TABS Take by mouth.   Taking  . traMADol (ULTRAM) 50 MG tablet Take 1 tablet (50 mg total) by mouth every 6 (six) hours as needed. (Patient not taking: Reported on 07/10/2018) 30 tablet 0 Not Taking   No Known Allergies  Social History    Tobacco Use  . Smoking status: Never Smoker  . Smokeless tobacco: Never Used  Substance Use Topics  . Alcohol use: Yes    Comment: rarely    Family History  Problem Relation Age of Onset  . Alzheimer's disease Father   . Colon cancer Neg Hx   . Esophageal cancer Neg Hx   . Rectal cancer Neg Hx   . Stomach cancer Neg Hx    Review of Systems  Constitutional: Negative for fatigue.  HENT: Negative for trouble swallowing.   Eyes: Negative for pain.  Respiratory: Negative for wheezing.   Cardiovascular: Negative for chest pain.  Gastrointestinal: Negative for constipation.  Endocrine: Negative for cold intolerance.  Genitourinary: Negative for difficulty urinating.  Musculoskeletal: Positive for joint swelling.  Skin: Negative for rash.  Allergic/Immunologic: Negative for food allergies.  Neurological: Negative for weakness.  Hematological: Does not bruise/bleed easily.  Psychiatric/Behavioral: Positive for sleep disturbance.    Objective:  Physical Exam  Constitutional: He is oriented to person, place, and time. He appears well-developed and well-nourished.  HENT:  Head: Normocephalic and atraumatic.  Eyes: Pupils are equal, round, and reactive to light. Conjunctivae and EOM are normal.  Neck: Neck supple. No thyromegaly present.  Cardiovascular: Normal rate, regular rhythm, normal heart sounds and intact distal pulses.  No murmur heard. Respiratory: Effort normal and breath sounds normal.  GI: Soft. Bowel sounds are normal.  Neurological: He is alert and oriented to person, place, and time.  Skin: Skin is warm and dry.  Psychiatric: He has a normal mood and affect. His behavior is normal. Judgment and thought content normal.    Vital signs in last 24 hours: Temp:  [97.9 F (36.6 C)] 97.9 F (36.6 C) (01/30 1206) Pulse Rate:  [67] 67 (01/30 1206) Resp:  [16] 16 (01/30 1206) BP: (151)/(68) 151/68 (01/30 1206) Weight:  [99.8 kg] 99.8 kg (01/30  1206)  Labs:   Estimated body mass index is 32.25 kg/m as calculated from the following:   Height as of 07/13/18: 5' 9.25" (1.759 m).   Weight as of 07/13/18: 99.8 kg.   Imaging Review Plain radiographs demonstrate moderate degenerative joint disease of the left knee(s). The overall alignment issignificant varus. The bone quality appears to be good for age and reported activity level.   Preoperative templating of the joint replacement has been completed, documented, and submitted to the Operating Room personnel in order to optimize intra-operative equipment management.   Anticipated LOS equal to or greater than 2 midnights due to - Age 91 and older with one or more of the following:  - Obesity  - Expected need for hospital services (PT, OT, Nursing) required for safe  discharge  - Anticipated need for postoperative skilled nursing care or inpatient rehab  - Active co-morbidities: None OR   - Unanticipated findings during/Post Surgery: None  - Patient is a  high risk of re-admission due to: None     Assessment/Plan:  End stage arthritis, left knee   The patient history, physical examination, clinical judgment of the provider and imaging studies are consistent with end stage degenerative joint disease of the left knee(s) and total knee arthroplasty is deemed medically necessary. The treatment options including medical management, injection therapy arthroscopy and arthroplasty were discussed at length. The risks and benefits of total knee arthroplasty were presented and reviewed. The risks due to aseptic loosening, infection, stiffness, patella tracking problems, thromboembolic complications and other imponderables were discussed. The patient acknowledged the explanation, agreed to proceed with the plan and consent was signed. Patient is being admitted for inpatient treatment for surgery, pain control, PT, OT, prophylactic antibiotics, VTE prophylaxis, progressive ambulation and ADL's  and discharge planning. The patient is planning to be discharged home with home health services

## 2018-07-14 ENCOUNTER — Encounter (HOSPITAL_COMMUNITY)
Admission: RE | Admit: 2018-07-14 | Discharge: 2018-07-14 | Disposition: A | Discharge status: Home / Self Care | Payer: 59 | Source: Ambulatory Visit | Attending: Orthopedic Surgery | Admitting: Orthopedic Surgery

## 2018-07-14 ENCOUNTER — Encounter (HOSPITAL_COMMUNITY): Payer: Self-pay

## 2018-07-14 ENCOUNTER — Other Ambulatory Visit: Payer: Self-pay

## 2018-07-14 ENCOUNTER — Encounter (HOSPITAL_COMMUNITY)
Admission: RE | Admit: 2018-07-14 | Discharge: 2018-07-14 | Disposition: A | Discharge status: Home / Self Care | Payer: 59 | Source: Ambulatory Visit | Attending: Orthopaedic Surgery | Admitting: Orthopaedic Surgery

## 2018-07-14 DIAGNOSIS — Z01818 Encounter for other preprocedural examination: Secondary | ICD-10-CM | POA: Insufficient documentation

## 2018-07-14 DIAGNOSIS — I517 Cardiomegaly: Secondary | ICD-10-CM | POA: Diagnosis not present

## 2018-07-14 LAB — CBC WITH DIFFERENTIAL/PLATELET
Abs Immature Granulocytes: 0.02 K/uL (ref 0.00–0.07)
Basophils Absolute: 0.1 K/uL (ref 0.0–0.1)
Basophils Relative: 1 %
Eosinophils Absolute: 0.2 K/uL (ref 0.0–0.5)
Eosinophils Relative: 3 %
HCT: 43.8 % (ref 39.0–52.0)
Hemoglobin: 14.7 g/dL (ref 13.0–17.0)
Immature Granulocytes: 0 %
Lymphocytes Relative: 34 %
Lymphs Abs: 2 K/uL (ref 0.7–4.0)
MCH: 31.2 pg (ref 26.0–34.0)
MCHC: 33.6 g/dL (ref 30.0–36.0)
MCV: 93 fL (ref 80.0–100.0)
Monocytes Absolute: 0.5 K/uL (ref 0.1–1.0)
Monocytes Relative: 8 %
Neutro Abs: 3.2 K/uL (ref 1.7–7.7)
Neutrophils Relative %: 54 %
Platelets: 215 K/uL (ref 150–400)
RBC: 4.71 MIL/uL (ref 4.22–5.81)
RDW: 11.7 % (ref 11.5–15.5)
WBC: 6 K/uL (ref 4.0–10.5)
nRBC: 0 % (ref 0.0–0.2)

## 2018-07-14 LAB — COMPREHENSIVE METABOLIC PANEL
ALT: 29 U/L (ref 0–44)
AST: 21 U/L (ref 15–41)
Albumin: 4.1 g/dL (ref 3.5–5.0)
Alkaline Phosphatase: 45 U/L (ref 38–126)
Anion gap: 10 (ref 5–15)
BUN: 30 mg/dL — ABNORMAL HIGH (ref 8–23)
CALCIUM: 9.3 mg/dL (ref 8.9–10.3)
CO2: 26 mmol/L (ref 22–32)
Chloride: 105 mmol/L (ref 98–111)
Creatinine, Ser: 1.09 mg/dL (ref 0.61–1.24)
GFR calc Af Amer: >60 mL/min (ref >60)
GFR calc non Af Amer: >60 mL/min (ref >60)
Glucose, Bld: 129 mg/dL — ABNORMAL HIGH (ref 70–99)
Potassium: 3.9 mmol/L (ref 3.5–5.1)
Sodium: 141 mmol/L (ref 135–145)
Total Bilirubin: 0.8 mg/dL (ref 0.3–1.2)
Total Protein: 7.2 g/dL (ref 6.5–8.1)

## 2018-07-14 LAB — URINALYSIS, ROUTINE W REFLEX MICROSCOPIC
Bilirubin Urine: NEGATIVE
Glucose, UA: NEGATIVE mg/dL
Hgb urine dipstick: NEGATIVE
Ketones, ur: NEGATIVE mg/dL
Leukocytes, UA: NEGATIVE
Nitrite: NEGATIVE
Protein, ur: NEGATIVE mg/dL
SPECIFIC GRAVITY, URINE: 1.02 (ref 1.005–1.030)
pH: 5 (ref 5.0–8.0)

## 2018-07-14 LAB — TYPE AND SCREEN
ABO/RH(D): A POS
Antibody Screen: NEGATIVE

## 2018-07-14 LAB — PROTIME-INR
INR: 0.98
Prothrombin Time: 12.9 seconds (ref 11.4–15.2)

## 2018-07-14 LAB — APTT: aPTT: 25 seconds (ref 24–36)

## 2018-07-14 LAB — SURGICAL PCR SCREEN
MRSA, PCR: NEGATIVE
Staphylococcus aureus: NEGATIVE

## 2018-07-14 NOTE — Progress Notes (Signed)
PCP - [facility] Cardiologist - saw Dr. Einar Gip 2017 for stress test screen before last TKA  Chest x-ray -today  EKG - today Stress Test - 2017- Dr. Einar Gip ECHO -  Cardiac Cath -   Sleep Study - 2014 CPAP - wears nightly  Fasting Blood Sugar - N/A Checks Blood Sugar _____ times a day  Blood Thinner Instructions:none Aspirin Instructions:none  Anesthesia review:   Patient denies shortness of breath, fever, cough and chest pain at PAT appointment   Patient verbalized understanding of instructions that were given to them at the PAT appointment. Patient was also instructed that they will need to review over the PAT instructions again at home before surgery.

## 2018-07-14 NOTE — Progress Notes (Signed)
Chart will be sent for review to anesth. Group. Under notes dated 05/21/2016- review of chart by Durel Salts, NP

## 2018-07-15 LAB — URINE CULTURE: Culture: NO GROWTH

## 2018-07-17 NOTE — Anesthesia Preprocedure Evaluation (Deleted)
Anesthesia Evaluation    Airway        Dental   Pulmonary           Cardiovascular      Neuro/Psych    GI/Hepatic   Endo/Other    Renal/GU negative Renal ROS     Musculoskeletal   Abdominal   Peds  Hematology   Anesthesia Other Findings   Reproductive/Obstetrics                                                            Anesthesia Evaluation  Patient identified by MRN, date of birth, ID band Patient awake    Reviewed: Allergy & Precautions, NPO status , Patient's Chart, lab work & pertinent test results  History of Anesthesia Complications Negative for: history of anesthetic complications  Airway Mallampati: II  TM Distance: >3 FB Neck ROM: Full    Dental no notable dental hx. (+) Dental Advisory Given   Pulmonary sleep apnea and Continuous Positive Airway Pressure Ventilation ,    Pulmonary exam normal        Cardiovascular hypertension, negative cardio ROS Normal cardiovascular exam     Neuro/Psych  Headaches, negative psych ROS   GI/Hepatic negative GI ROS, Neg liver ROS,   Endo/Other  negative endocrine ROS  Renal/GU negative Renal ROS     Musculoskeletal negative musculoskeletal ROS (+)   Abdominal   Peds  Hematology negative hematology ROS (+)   Anesthesia Other Findings Day of surgery medications reviewed with the patient.  Reproductive/Obstetrics                            Anesthesia Physical Anesthesia Plan  ASA: II  Anesthesia Plan: MAC and Spinal   Post-op Pain Management:  Regional for Post-op pain   Induction:   Airway Management Planned: Natural Airway  Additional Equipment:   Intra-op Plan:   Post-operative Plan:   Informed Consent: I have reviewed the patients History and Physical, chart, labs and discussed the procedure including the risks, benefits and alternatives for the proposed anesthesia  with the patient or authorized representative who has indicated his/her understanding and acceptance.   Dental advisory given  Plan Discussed with: CRNA, Anesthesiologist and Surgeon  Anesthesia Plan Comments:        Anesthesia Quick Evaluation  Anesthesia Physical Anesthesia Plan  ASA:   Anesthesia Plan:    Post-op Pain Management:    Induction:   PONV Risk Score and Plan:   Airway Management Planned:   Additional Equipment:   Intra-op Plan:   Post-operative Plan:   Informed Consent:   Plan Discussed with:   Anesthesia Plan Comments: (Seen by Dr. Einar Gip in 2017 for preop eval prior to R TKA due to cardiac risk factors. No hx of CAD. He underwent nuclear stress test 05/20/16 that was low risk with normal LV function, normal wall motion, LVEF 69%, and was cleared for surgery. )       Anesthesia Quick Evaluation

## 2018-07-24 ENCOUNTER — Other Ambulatory Visit: Payer: Self-pay | Admitting: *Deleted

## 2018-07-24 MED ORDER — ACETAMINOPHEN 10 MG/ML IV SOLN
1000.0000 mg | Freq: Once | INTRAVENOUS | Status: AC
  Administered 2018-07-25: 1000 mg via INTRAVENOUS
  Filled 2018-07-24: qty 100

## 2018-07-24 MED ORDER — CEFAZOLIN SODIUM-DEXTROSE 2-4 GM/100ML-% IV SOLN
2.0000 g | INTRAVENOUS | Status: AC
  Administered 2018-07-25: 2 g via INTRAVENOUS
  Filled 2018-07-24: qty 100

## 2018-07-24 MED ORDER — TRANEXAMIC ACID 1000 MG/10ML IV SOLN
2000.0000 mg | INTRAVENOUS | Status: AC
  Administered 2018-07-25: 2000 mg via TOPICAL
  Filled 2018-07-24: qty 20

## 2018-07-24 NOTE — Patient Outreach (Signed)
[facility]) Care Management  07/24/2018  James Gallegos [birth year] [id number]  Preoperative Screening Call Referral received: 07/20/18 Surgery date: 07/25/18 Insurance: [employer] Save/[employer] Plan    Subjective: Initial unsuccessful telephone call to patient's preferred number in order to complete pre-op screening; no answer, left HIPAA compliant voicemail message requesting return call.   Objective: Per the electronic medical record, is scheduled for left knee replacement on 07/25/18 at [facility]. He completed his pre-op admission testing visit on 1/31/ . Comorbidities include: HTN, sleep apnea, hyperlipidemia, s/p right total knee replacement   Plan: If patient does not return call, this RNCM will call patient for transition of care outreach within 72 hours of hospital discharge notification.  Barrington Ellison RN,CCM,CDE [facility] Management Coordinator Office Phone [phone] Office Fax [phone]

## 2018-07-24 NOTE — Anesthesia Preprocedure Evaluation (Signed)
Anesthesia Evaluation  Patient identified by MRN, date of birth, ID band Patient awake    Reviewed: Allergy & Precautions, NPO status , Patient's Chart, lab work & pertinent test results  History of Anesthesia Complications Negative for: history of anesthetic complications  Airway Mallampati: II  TM Distance: >3 FB Neck ROM: Full    Dental no notable dental hx. (+) Dental Advisory Given   Pulmonary sleep apnea and Continuous Positive Airway Pressure Ventilation ,    Pulmonary exam normal        Cardiovascular hypertension, negative cardio ROS Normal cardiovascular exam     Neuro/Psych  Headaches, negative psych ROS   GI/Hepatic negative GI ROS, Neg liver ROS,   Endo/Other  negative endocrine ROS  Renal/GU negative Renal ROS     Musculoskeletal negative musculoskeletal ROS (+)   Abdominal   Peds  Hematology negative hematology ROS (+)   Anesthesia Other Findings Day of surgery medications reviewed with the patient.  Reproductive/Obstetrics                             Anesthesia Physical  Anesthesia Plan  ASA: II  Anesthesia Plan: MAC and Spinal   Post-op Pain Management:  Regional for Post-op pain   Induction:   PONV Risk Score and Plan: 2 and Ondansetron and Treatment may vary due to age or medical condition  Airway Management Planned: Natural Airway  Additional Equipment:   Intra-op Plan:   Post-operative Plan:   Informed Consent: I have reviewed the patients History and Physical, chart, labs and discussed the procedure including the risks, benefits and alternatives for the proposed anesthesia with the patient or authorized representative who has indicated his/her understanding and acceptance.     Dental advisory given  Plan Discussed with: CRNA, Anesthesiologist and Surgeon  Anesthesia Plan Comments:         Anesthesia Quick Evaluation

## 2018-07-25 ENCOUNTER — Inpatient Hospital Stay (HOSPITAL_COMMUNITY): Payer: 59 | Admitting: Anesthesiology

## 2018-07-25 ENCOUNTER — Encounter (HOSPITAL_COMMUNITY)
Admission: RE | Disposition: A | Discharge status: Home Health | Payer: Self-pay | Source: Home / Self Care | Attending: Orthopaedic Surgery

## 2018-07-25 ENCOUNTER — Inpatient Hospital Stay (HOSPITAL_COMMUNITY): Payer: 59 | Admitting: Physician Assistant

## 2018-07-25 ENCOUNTER — Inpatient Hospital Stay (HOSPITAL_COMMUNITY)
Admission: RE | Admit: 2018-07-25 | Discharge: 2018-07-26 | DRG: 470 | Disposition: A | Discharge status: Home Health | Payer: 59 | Attending: Orthopaedic Surgery | Admitting: Orthopaedic Surgery

## 2018-07-25 ENCOUNTER — Encounter (HOSPITAL_COMMUNITY): Payer: Self-pay | Admitting: *Deleted

## 2018-07-25 ENCOUNTER — Other Ambulatory Visit: Payer: Self-pay

## 2018-07-25 DIAGNOSIS — E785 Hyperlipidemia, unspecified: Secondary | ICD-10-CM | POA: Diagnosis present

## 2018-07-25 DIAGNOSIS — M25562 Pain in left knee: Secondary | ICD-10-CM | POA: Diagnosis present

## 2018-07-25 DIAGNOSIS — Z6832 Body mass index (BMI) 32.0-32.9, adult: Secondary | ICD-10-CM

## 2018-07-25 DIAGNOSIS — E669 Obesity, unspecified: Secondary | ICD-10-CM | POA: Diagnosis present

## 2018-07-25 DIAGNOSIS — G8918 Other acute postprocedural pain: Secondary | ICD-10-CM | POA: Diagnosis not present

## 2018-07-25 DIAGNOSIS — K219 Gastro-esophageal reflux disease without esophagitis: Secondary | ICD-10-CM | POA: Diagnosis present

## 2018-07-25 DIAGNOSIS — Z96652 Presence of left artificial knee joint: Secondary | ICD-10-CM

## 2018-07-25 DIAGNOSIS — I10 Essential (primary) hypertension: Secondary | ICD-10-CM | POA: Diagnosis not present

## 2018-07-25 DIAGNOSIS — M1712 Unilateral primary osteoarthritis, left knee: Principal | ICD-10-CM

## 2018-07-25 DIAGNOSIS — Z96651 Presence of right artificial knee joint: Secondary | ICD-10-CM | POA: Diagnosis present

## 2018-07-25 DIAGNOSIS — G4730 Sleep apnea, unspecified: Secondary | ICD-10-CM | POA: Diagnosis present

## 2018-07-25 DIAGNOSIS — Z79899 Other long term (current) drug therapy: Secondary | ICD-10-CM | POA: Diagnosis not present

## 2018-07-25 HISTORY — PX: TOTAL KNEE ARTHROPLASTY: SHX125

## 2018-07-25 LAB — GLUCOSE, CAPILLARY: Glucose-Capillary: 166 mg/dL — ABNORMAL HIGH (ref 70–99)

## 2018-07-25 SURGERY — ARTHROPLASTY, KNEE, TOTAL
Anesthesia: Monitor Anesthesia Care | Site: Knee | Laterality: Left

## 2018-07-25 MED ORDER — PROPOFOL 10 MG/ML IV BOLUS
INTRAVENOUS | Status: AC
  Filled 2018-07-25: qty 20

## 2018-07-25 MED ORDER — LOSARTAN POTASSIUM 50 MG PO TABS
100.0000 mg | ORAL_TABLET | Freq: Every day | ORAL | Status: DC
  Administered 2018-07-25 – 2018-07-26 (×2): 100 mg via ORAL
  Filled 2018-07-25 (×2): qty 2

## 2018-07-25 MED ORDER — CLONIDINE HCL (ANALGESIA) 100 MCG/ML EP SOLN
EPIDURAL | Status: DC | PRN
  Administered 2018-07-25: 100 ug

## 2018-07-25 MED ORDER — ONDANSETRON HCL 4 MG PO TABS: 4.0000 mg | ORAL_TABLET | Freq: Four times a day (QID) | ORAL | Status: DC | PRN

## 2018-07-25 MED ORDER — METOCLOPRAMIDE HCL 5 MG/ML IJ SOLN: 5.0000 mg | Freq: Three times a day (TID) | INTRAMUSCULAR | Status: DC | PRN

## 2018-07-25 MED ORDER — ASPIRIN EC 325 MG PO TBEC
325.0000 mg | DELAYED_RELEASE_TABLET | Freq: Every day | ORAL | Status: DC
  Administered 2018-07-26: 325 mg via ORAL
  Filled 2018-07-25: qty 1

## 2018-07-25 MED ORDER — LIDOCAINE 2% (20 MG/ML) 5 ML SYRINGE
INTRAMUSCULAR | Status: AC
  Filled 2018-07-25: qty 10

## 2018-07-25 MED ORDER — FLEET ENEMA 7-19 GM/118ML RE ENEM: 1.0000 | ENEMA | Freq: Once | RECTAL | Status: DC | PRN

## 2018-07-25 MED ORDER — BUPIVACAINE HCL (PF) 0.25 % IJ SOLN
INTRAMUSCULAR | Status: AC
  Filled 2018-07-25: qty 30

## 2018-07-25 MED ORDER — OXYCODONE HCL 5 MG PO TABS
5.0000 mg | ORAL_TABLET | Freq: Once | ORAL | Status: AC | PRN
  Administered 2018-07-25: 5 mg via ORAL

## 2018-07-25 MED ORDER — MAGNESIUM 400 MG PO TABS: ORAL_TABLET | Freq: Every day | ORAL | Status: DC

## 2018-07-25 MED ORDER — FLUTICASONE PROPIONATE 50 MCG/ACT NA SUSP
1.0000 | Freq: Every day | NASAL | Status: DC | PRN
  Filled 2018-07-25: qty 16

## 2018-07-25 MED ORDER — CHLORHEXIDINE GLUCONATE 4 % EX LIQD: 60.0000 mL | Freq: Once | CUTANEOUS | Status: DC

## 2018-07-25 MED ORDER — ROPIVACAINE HCL 7.5 MG/ML IJ SOLN
INTRAMUSCULAR | Status: DC | PRN
  Administered 2018-07-25: 25 mL via PERINEURAL

## 2018-07-25 MED ORDER — SODIUM CHLORIDE 0.9 % IV SOLN
75.0000 mL/h | INTRAVENOUS | Status: DC
  Administered 2018-07-25: 75 mL/h via INTRAVENOUS

## 2018-07-25 MED ORDER — HYDROMORPHONE HCL 1 MG/ML IJ SOLN
0.5000 mg | INTRAMUSCULAR | Status: DC | PRN
  Administered 2018-07-25 – 2018-07-26 (×2): 1 mg via INTRAVENOUS
  Filled 2018-07-25 (×2): qty 1

## 2018-07-25 MED ORDER — LOSARTAN POTASSIUM-HCTZ 100-25 MG PO TABS: 1.0000 | ORAL_TABLET | Freq: Every day | ORAL | Status: DC

## 2018-07-25 MED ORDER — LACTATED RINGERS IV SOLN
INTRAVENOUS | Status: DC | PRN
  Administered 2018-07-25: 07:00:00 via INTRAVENOUS

## 2018-07-25 MED ORDER — OXYCODONE HCL 5 MG PO TABS
ORAL_TABLET | ORAL | Status: AC
  Filled 2018-07-25: qty 1

## 2018-07-25 MED ORDER — ACETAMINOPHEN 10 MG/ML IV SOLN
1000.0000 mg | Freq: Four times a day (QID) | INTRAVENOUS | Status: AC
  Administered 2018-07-25 – 2018-07-26 (×3): 1000 mg via INTRAVENOUS
  Filled 2018-07-25 (×3): qty 100

## 2018-07-25 MED ORDER — DOCUSATE SODIUM 100 MG PO CAPS
100.0000 mg | ORAL_CAPSULE | Freq: Two times a day (BID) | ORAL | Status: DC
  Administered 2018-07-25 – 2018-07-26 (×2): 100 mg via ORAL
  Filled 2018-07-25 (×2): qty 1

## 2018-07-25 MED ORDER — BUPIVACAINE HCL (PF) 0.25 % IJ SOLN
INTRAMUSCULAR | Status: DC | PRN
  Administered 2018-07-25: 30 mL

## 2018-07-25 MED ORDER — FENTANYL CITRATE (PF) 250 MCG/5ML IJ SOLN
INTRAMUSCULAR | Status: AC
  Filled 2018-07-25: qty 5

## 2018-07-25 MED ORDER — HYDROCHLOROTHIAZIDE 25 MG PO TABS
25.0000 mg | ORAL_TABLET | Freq: Every day | ORAL | Status: DC
  Administered 2018-07-25 – 2018-07-26 (×2): 25 mg via ORAL
  Filled 2018-07-25 (×2): qty 1

## 2018-07-25 MED ORDER — METHOCARBAMOL 500 MG PO TABS
500.0000 mg | ORAL_TABLET | Freq: Four times a day (QID) | ORAL | Status: DC | PRN
  Administered 2018-07-25 – 2018-07-26 (×3): 500 mg via ORAL
  Filled 2018-07-25 (×2): qty 1

## 2018-07-25 MED ORDER — ALUM & MAG HYDROXIDE-SIMETH 200-200-20 MG/5ML PO SUSP: 30.0000 mL | ORAL | Status: DC | PRN

## 2018-07-25 MED ORDER — PROPOFOL 1000 MG/100ML IV EMUL
INTRAVENOUS | Status: AC
  Filled 2018-07-25: qty 200

## 2018-07-25 MED ORDER — MIDAZOLAM HCL 2 MG/2ML IJ SOLN
INTRAMUSCULAR | Status: AC
  Filled 2018-07-25: qty 2

## 2018-07-25 MED ORDER — KETOROLAC TROMETHAMINE 15 MG/ML IJ SOLN
INTRAMUSCULAR | Status: AC
  Filled 2018-07-25: qty 1

## 2018-07-25 MED ORDER — MAGNESIUM HYDROXIDE 400 MG/5ML PO SUSP: 30.0000 mL | Freq: Every day | ORAL | Status: DC | PRN

## 2018-07-25 MED ORDER — ACETAMINOPHEN 160 MG/5ML PO SOLN: 325.0000 mg | ORAL | Status: DC | PRN

## 2018-07-25 MED ORDER — 0.9 % SODIUM CHLORIDE (POUR BTL) OPTIME
TOPICAL | Status: DC | PRN
  Administered 2018-07-25: 1000 mL

## 2018-07-25 MED ORDER — KETOROLAC TROMETHAMINE 15 MG/ML IJ SOLN
7.5000 mg | Freq: Four times a day (QID) | INTRAMUSCULAR | Status: AC
  Administered 2018-07-25 – 2018-07-26 (×4): 7.5 mg via INTRAVENOUS
  Filled 2018-07-25 (×3): qty 1

## 2018-07-25 MED ORDER — METHOCARBAMOL 500 MG PO TABS
ORAL_TABLET | ORAL | Status: AC
  Filled 2018-07-25: qty 1

## 2018-07-25 MED ORDER — LIDOCAINE HCL (CARDIAC) PF 100 MG/5ML IV SOSY
PREFILLED_SYRINGE | INTRAVENOUS | Status: DC | PRN
  Administered 2018-07-25: 100 mg via INTRAVENOUS

## 2018-07-25 MED ORDER — PHENOL 1.4 % MT LIQD: 1.0000 | OROMUCOSAL | Status: DC | PRN

## 2018-07-25 MED ORDER — BUPIVACAINE IN DEXTROSE 0.75-8.25 % IT SOLN
INTRATHECAL | Status: DC | PRN
  Administered 2018-07-25: 1.6 mL via INTRATHECAL

## 2018-07-25 MED ORDER — MENTHOL 3 MG MT LOZG: 1.0000 | LOZENGE | OROMUCOSAL | Status: DC | PRN

## 2018-07-25 MED ORDER — MEPERIDINE HCL 50 MG/ML IJ SOLN: 6.2500 mg | INTRAMUSCULAR | Status: DC | PRN

## 2018-07-25 MED ORDER — VITAMIN B-12 1000 MCG PO TABS
5000.0000 ug | ORAL_TABLET | Freq: Every day | ORAL | Status: DC
  Administered 2018-07-26: 5000 ug via ORAL
  Filled 2018-07-25: qty 5

## 2018-07-25 MED ORDER — SODIUM CHLORIDE 0.9 % IR SOLN
Status: DC | PRN
  Administered 2018-07-25: 3000 mL

## 2018-07-25 MED ORDER — METOCLOPRAMIDE HCL 5 MG PO TABS: 5.0000 mg | ORAL_TABLET | Freq: Three times a day (TID) | ORAL | Status: DC | PRN

## 2018-07-25 MED ORDER — BISACODYL 10 MG RE SUPP: 10.0000 mg | Freq: Every day | RECTAL | Status: DC | PRN

## 2018-07-25 MED ORDER — DIPHENHYDRAMINE HCL 12.5 MG/5ML PO ELIX: 12.5000 mg | ORAL_SOLUTION | ORAL | Status: DC | PRN

## 2018-07-25 MED ORDER — PROPOFOL 10 MG/ML IV BOLUS
INTRAVENOUS | Status: DC | PRN
  Administered 2018-07-25: 20 mg via INTRAVENOUS

## 2018-07-25 MED ORDER — FENTANYL CITRATE (PF) 100 MCG/2ML IJ SOLN
INTRAMUSCULAR | Status: DC | PRN
  Administered 2018-07-25: 50 ug via INTRAVENOUS

## 2018-07-25 MED ORDER — ONDANSETRON HCL 4 MG/2ML IJ SOLN: 4.0000 mg | Freq: Once | INTRAMUSCULAR | Status: DC | PRN

## 2018-07-25 MED ORDER — PHENYLEPHRINE 40 MCG/ML (10ML) SYRINGE FOR IV PUSH (FOR BLOOD PRESSURE SUPPORT)
PREFILLED_SYRINGE | INTRAVENOUS | Status: AC
  Filled 2018-07-25: qty 10

## 2018-07-25 MED ORDER — VITAMIN D 25 MCG (1000 UNIT) PO TABS
5000.0000 [IU] | ORAL_TABLET | Freq: Every day | ORAL | Status: DC
  Administered 2018-07-26: 5000 [IU] via ORAL
  Filled 2018-07-25: qty 5

## 2018-07-25 MED ORDER — ONDANSETRON HCL 4 MG/2ML IJ SOLN: 4.0000 mg | Freq: Four times a day (QID) | INTRAMUSCULAR | Status: DC | PRN

## 2018-07-25 MED ORDER — SODIUM CHLORIDE 0.9 % IV SOLN: INTRAVENOUS | Status: DC

## 2018-07-25 MED ORDER — OXYCODONE HCL 5 MG PO TABS
5.0000 mg | ORAL_TABLET | ORAL | Status: DC | PRN
  Administered 2018-07-25 – 2018-07-26 (×5): 10 mg via ORAL
  Filled 2018-07-25 (×5): qty 2

## 2018-07-25 MED ORDER — METHOCARBAMOL 1000 MG/10ML IJ SOLN
500.0000 mg | Freq: Four times a day (QID) | INTRAVENOUS | Status: DC | PRN
  Filled 2018-07-25: qty 5

## 2018-07-25 MED ORDER — OXYCODONE HCL 5 MG/5ML PO SOLN: 5.0000 mg | Freq: Once | ORAL | Status: AC | PRN

## 2018-07-25 MED ORDER — PROPOFOL 500 MG/50ML IV EMUL
INTRAVENOUS | Status: DC | PRN
  Administered 2018-07-25: 90 ug/kg/min via INTRAVENOUS
  Administered 2018-07-25: 75 ug/kg/min via INTRAVENOUS

## 2018-07-25 MED ORDER — MIDAZOLAM HCL 2 MG/2ML IJ SOLN
INTRAMUSCULAR | Status: DC | PRN
  Administered 2018-07-25: 2 mg via INTRAVENOUS

## 2018-07-25 MED ORDER — ACETAMINOPHEN 325 MG PO TABS: 325.0000 mg | ORAL_TABLET | ORAL | Status: DC | PRN

## 2018-07-25 MED ORDER — FENTANYL CITRATE (PF) 100 MCG/2ML IJ SOLN: 25.0000 ug | INTRAMUSCULAR | Status: DC | PRN

## 2018-07-25 MED ORDER — CEFAZOLIN SODIUM-DEXTROSE 2-4 GM/100ML-% IV SOLN
2.0000 g | Freq: Four times a day (QID) | INTRAVENOUS | Status: AC
  Administered 2018-07-25 (×2): 2 g via INTRAVENOUS
  Filled 2018-07-25 (×2): qty 100

## 2018-07-25 SURGICAL SUPPLY — 68 items
BAG DECANTER FOR FLEXI CONT (MISCELLANEOUS) ×2 IMPLANT
BANDAGE ESMARK 6X9 LF (GAUZE/BANDAGES/DRESSINGS) ×1 IMPLANT
BLADE SAGITTAL 25.0X1.19X90 (BLADE) ×2 IMPLANT
BNDG CMPR 9X6 STRL LF SNTH (GAUZE/BANDAGES/DRESSINGS) ×1
BNDG ESMARK 6X9 LF (GAUZE/BANDAGES/DRESSINGS) ×2
BOWL SMART MIX CTS (DISPOSABLE) ×2 IMPLANT
CEMENT HV SMART SET (Cement) ×4 IMPLANT
CEMENT TIBIA MBT SIZE 4 (Knees) IMPLANT
COMP FEM CEM STD+ LT LCS (Orthopedic Implant) ×2 IMPLANT
COMP PATELLA PEGX3 CEM STAN+ (Knees) ×2 IMPLANT
COMPONENT FEM CEM STD+ LT LCS (Orthopedic Implant) IMPLANT
COMPONENT PTLA PEGX3 CEM STAN+ (Knees) IMPLANT
COVER SURGICAL LIGHT HANDLE (MISCELLANEOUS) ×2 IMPLANT
COVER WAND RF STERILE (DRAPES) ×2 IMPLANT
CUFF TOURNIQUET SINGLE 34IN LL (TOURNIQUET CUFF) ×2 IMPLANT
CUFF TOURNIQUET SINGLE 44IN (TOURNIQUET CUFF) IMPLANT
DECANTER SPIKE VIAL GLASS SM (MISCELLANEOUS) ×2 IMPLANT
DRAPE EXTREMITY T 121X128X90 (DISPOSABLE) ×2 IMPLANT
DRAPE HALF SHEET 40X57 (DRAPES) ×4 IMPLANT
DRSG ADAPTIC 3X8 NADH LF (GAUZE/BANDAGES/DRESSINGS) ×2 IMPLANT
DRSG PAD ABDOMINAL 8X10 ST (GAUZE/BANDAGES/DRESSINGS) ×4 IMPLANT
DURAPREP 26ML APPLICATOR (WOUND CARE) ×4 IMPLANT
ELECT CAUTERY BLADE 6.4 (BLADE) ×2 IMPLANT
ELECT REM PT RETURN 9FT ADLT (ELECTROSURGICAL) ×2
ELECTRODE REM PT RTRN 9FT ADLT (ELECTROSURGICAL) ×1 IMPLANT
EVACUATOR 1/8 PVC DRAIN (DRAIN) IMPLANT
FACESHIELD WRAPAROUND (MASK) ×4 IMPLANT
FACESHIELD WRAPAROUND OR TEAM (MASK) ×2 IMPLANT
GAUZE SPONGE 4X4 12PLY STRL (GAUZE/BANDAGES/DRESSINGS) ×2 IMPLANT
GLOVE BIOGEL PI IND STRL 8 (GLOVE) ×1 IMPLANT
GLOVE BIOGEL PI IND STRL 8.5 (GLOVE) ×1 IMPLANT
GLOVE BIOGEL PI INDICATOR 8 (GLOVE) ×1
GLOVE BIOGEL PI INDICATOR 8.5 (GLOVE) ×1
GLOVE ECLIPSE 8.0 STRL XLNG CF (GLOVE) ×4 IMPLANT
GLOVE ECLIPSE 8.5 STRL (GLOVE) ×4 IMPLANT
GOWN STRL REUS W/ TWL LRG LVL3 (GOWN DISPOSABLE) ×2 IMPLANT
GOWN STRL REUS W/TWL 2XL LVL3 (GOWN DISPOSABLE) ×2 IMPLANT
GOWN STRL REUS W/TWL LRG LVL3 (GOWN DISPOSABLE) ×4
HANDPIECE INTERPULSE COAX TIP (DISPOSABLE) ×2
INSERT TIB LCS RP STD+ 10 (Knees) ×1 IMPLANT
KIT BASIN OR (CUSTOM PROCEDURE TRAY) ×2 IMPLANT
KIT TURNOVER KIT B (KITS) ×2 IMPLANT
MANIFOLD NEPTUNE II (INSTRUMENTS) ×2 IMPLANT
NEEDLE 22X1 1/2 (OR ONLY) (NEEDLE) ×2 IMPLANT
NS IRRIG 1000ML POUR BTL (IV SOLUTION) ×2 IMPLANT
PACK TOTAL JOINT (CUSTOM PROCEDURE TRAY) ×2 IMPLANT
PAD ABD 8X10 STRL (GAUZE/BANDAGES/DRESSINGS) ×1 IMPLANT
PAD ARMBOARD 7.5X6 YLW CONV (MISCELLANEOUS) ×4 IMPLANT
PAD CAST 4YDX4 CTTN HI CHSV (CAST SUPPLIES) ×1 IMPLANT
PADDING CAST COTTON 4X4 STRL (CAST SUPPLIES) ×2
PADDING CAST COTTON 6X4 STRL (CAST SUPPLIES) ×2 IMPLANT
PIN STEINMAN FIXATION KNEE (PIN) ×1 IMPLANT
SET HNDPC FAN SPRY TIP SCT (DISPOSABLE) ×1 IMPLANT
STAPLER VISISTAT 35W (STAPLE) ×2 IMPLANT
SUCTION FRAZIER HANDLE 10FR (MISCELLANEOUS) ×1
SUCTION TUBE FRAZIER 10FR DISP (MISCELLANEOUS) ×1 IMPLANT
SURGIFLO W/THROMBIN 8M KIT (HEMOSTASIS) IMPLANT
SUT BONE WAX W31G (SUTURE) ×2 IMPLANT
SUT ETHIBOND NAB CT1 #1 30IN (SUTURE) ×4 IMPLANT
SUT MNCRL AB 3-0 PS2 18 (SUTURE) ×2 IMPLANT
SUT VIC AB 0 CT1 27 (SUTURE) ×2
SUT VIC AB 0 CT1 27XBRD ANBCTR (SUTURE) ×1 IMPLANT
SYR CONTROL 10ML LL (SYRINGE) IMPLANT
TIBIA MBT CEMENT SIZE 4 (Knees) ×2 IMPLANT
TOWEL OR 17X24 6PK STRL BLUE (TOWEL DISPOSABLE) ×2 IMPLANT
TOWEL OR 17X26 10 PK STRL BLUE (TOWEL DISPOSABLE) ×2 IMPLANT
TRAY FOLEY BAG SILVER LF 16FR (SET/KITS/TRAYS/PACK) ×2 IMPLANT
WRAP KNEE MAXI GEL POST OP (GAUZE/BANDAGES/DRESSINGS) ×2 IMPLANT

## 2018-07-25 NOTE — Plan of Care (Signed)

## 2018-07-25 NOTE — Transfer of Care (Signed)
Immediate Anesthesia Transfer of Care Note  Patient: James Gallegos  Procedure(s) Performed: LEFT TOTAL KNEE ARTHROPLASTY (Left Knee)  Patient Location: PACU  Anesthesia Type:Spinal and MAC combined with regional for post-op pain  Level of Consciousness: awake, alert  and oriented  Airway & Oxygen Therapy: Patient Spontanous Breathing  Post-op Assessment: Report given to RN and Post -op Vital signs reviewed and stable  Post vital signs: Reviewed and stable  Last Vitals:  Vitals Value Taken Time  BP 107/52 07/25/2018  9:40 AM  Temp    Pulse 68 07/25/2018  9:41 AM  Resp 17 07/25/2018  9:41 AM  SpO2 98 % 07/25/2018  9:41 AM  Vitals shown include unvalidated device data.  Last Pain:  Vitals:   07/25/18 0626  TempSrc: Oral  PainSc:          Complications: No apparent anesthesia complications

## 2018-07-25 NOTE — Anesthesia Procedure Notes (Addendum)
Anesthesia Regional Block: Adductor canal block   Pre-Anesthetic Checklist: ,, timeout performed, Correct Patient, Correct Site, Correct Laterality, Correct Procedure, Correct Position, site marked, Risks and benefits discussed,  Surgical consent,  Pre-op evaluation,  At surgeon's request and post-op pain management  Laterality: Left  Prep: chloraprep       Needles:  Injection technique: Single-shot  Needle Type: Echogenic Stimulator Needle     Needle Length: 5cm  Needle Gauge: 22     Additional Needles:   Procedures:, nerve stimulator,,, ultrasound used (permanent image in chart),,,,  Narrative:  Start time: 07/25/2018 7:00 AM End time: 07/25/2018 7:05 AM Injection made incrementally with aspirations every 5 mL.  Performed by: Personally  Anesthesiologist: Janeece Riggers, MD  Additional Notes: Functioning IV was confirmed and monitors were applied.  A 47mm 22ga Arrow echogenic stimulator needle was used. Sterile prep and drape,hand hygiene and sterile gloves were used. Ultrasound guidance: relevant anatomy identified, needle position confirmed, local anesthetic spread visualized around nerve(s)., vascular puncture avoided.  Image printed for medical record. Negative aspiration and negative test dose prior to incremental administration of local anesthetic. The patient tolerated the procedure well.

## 2018-07-25 NOTE — Progress Notes (Signed)
Orthopedic Tech Progress Note Patient Details:  James Gallegos [birth year] [id number]  CPM Left Knee CPM Left Knee: On Left Knee Flexion (Degrees): 90 Left Knee Extension (Degrees): 0 Additional Comments: foot roll  Post Interventions Patient Tolerated: Well Instructions Provided: Care of device, Adjustment of device  [place] Pink 07/25/2018, 3:41 PM

## 2018-07-25 NOTE — Op Note (Signed)
PATIENT ID:      James Gallegos  MRN:     [id number] DOB/AGE:    [birth year] / 68 y.o.       OPERATIVE REPORT    DATE OF PROCEDURE:  07/25/2018       PREOPERATIVE DIAGNOSIS: end stage  left knee osteoarthritis                                                       Estimated body mass index is 32.25 kg/m as calculated from the following:   Height as of this encounter: 5' 9.25" (1.759 m).   Weight as of this encounter: 99.8 kg.     POSTOPERATIVE DIAGNOSIS: end stage  left knee osteoarthritis                                                                     Estimated body mass index is 32.25 kg/m as calculated from the following:   Height as of this encounter: 5' 9.25" (1.759 m).   Weight as of this encounter: 99.8 kg.     PROCEDURE:  Procedure(s): LEFT TOTAL KNEE ARTHROPLASTY      SURGEON:  [name], MD    ASSISTANT:   Biagio Borg, PA-C   (Present and scrubbed throughout the case, critical for assistance with exposure, retraction, instrumentation, and closure.)          ANESTHESIA: regional, spinal and IV sedation     DRAINS: none :      TOURNIQUET TIME:  Total Tourniquet Time Documented: Thigh (Left) - 72 minutes Total: Thigh (Left) - 72 minutes     COMPLICATIONS:  None   CONDITION:  stable  PROCEDURE IN DETAIL: [id number]   James Gallegos 07/25/2018, 9:11 AM

## 2018-07-25 NOTE — Op Note (Signed)
NAME: James Gallegos, James Gallegos MEDICAL RECORD [id number] ACCOUNT [id number] DATE OF BIRTH:09-02-1950 FACILITY: [facility] LOCATION: [place], [place]  OPERATIVE REPORT  DATE OF PROCEDURE:  07/25/2018  PREOPERATIVE DIAGNOSIS:  End-stage osteoarthritis, left knee.  POSTOPERATIVE DIAGNOSIS:  End-stage osteoarthritis, left knee.  PROCEDURE:  Left total knee replacement.  SURGEON:  [name], MD  ASSISTANT:  Biagio Borg, PA-C, was present throughout the operative procedure to ensure its timely completion.  ANESTHESIA:  IV sedation with adductor canal block and spinal.  COMPLICATIONS:  None.  COMPONENTS:  DePuy LCS standard plus femoral component, a #4 rotating keeled tibial tray with a 10 mm polyethylene bridging bearing a metal backed 3 peg rotating patella.  Components were secured with polymethyl methacrylate.  DESCRIPTION OF PROCEDURE:  The patient was met with his wife in the holding area and identified the left knee as the appropriate operative site and marked it accordingly.  Anesthesia performed an adductor canal block.  The patient was then transported to room 6.  He was placed comfortably on the operating table in the sitting position.  Anesthesia performed spinal anesthetic without problem.  The patient was then placed supine on the operating table under IV sedation,  the left lower extremity was placed in a thigh tourniquet.  The left lower extremity was then prepped with chlorhexidine scrub and DuraPrep x2 from the tourniquet to the tips of the toes.  Sterile draping was performed.  The extremity was then elevated and Esmarch exsanguinated with a proximal tourniquet at 350 mmHg.  Timeout was called again.  A midline longitudinal incision was made centered about the patella extending from the superior pouch to the tibial tubercle.  Via sharp dissection, incision carried down to subcutaneous tissue.  The first layer of capsule was incised in the  midline.  A  medial parapatellar incision was then made with the Bovie.  The joint was entered.  There was a clear yellow joint effusion of about 25 mL.  Patella was everted 180 degrees laterally and the knee flexed at 90 degrees.  There was a moderate amount of beefy red synovitis.  Synovectomy was performed.  There was complete absence of articular cartilage on the medial compartment, both on the tibia  side and the femur.  There was a fixed flexion contracture of about 5 degrees preoperatively and varus.  I performed a partial medial release subperiosteally along the tibia, at which point, I could correct the need to neutral.  I measured a standard plus femoral component.  First bony cut was then made transversely in the proximal tibia with a 7 degree angle of declination using the external tibial guide.  After each bony cut on the tibia and the femur, I checked my alignment with the external alignment guide.  Cuts were then made on the femur using the standard plus femoral jig.  I used a 4 degree distal femoral valgus cut.  Laminar spreaders were then inserted along the medial and lateral compartment.  I removed medial and lateral menisci as well as ACL and PCL.  MCL and LCL remained intact throughout the procedure.  My flexion and extension gaps were symmetrical at 10 mm.  A 3/4 inch osteotome was used to remove any osteophytes from the posterior femoral condyles, both medially and laterally.  There were no loose bodies.  I did not encounter a popliteal cyst.  Final cuts were then made on the femur using the tapered jig.  Tibia was then prepared using a 4 tibial jig.  This was pinned in place.  Center hole was then made followed by the keeled cut.  With the tibial jig in place, the 10 mm polyethylene bridging bearing was applied followed by the trial standard plus femoral  component.  The entire construct was reduced and through a full range of motion remained perfectly stable.  The  patella was prepared by removing about 11 mm of bone leaving 14 mm patella thickness.  The trial patella was inserted and reduced.  On full range of motion, there was no instability.  The trial components were removed.  The joint was copiously irrigated with saline solution.  Final components were then impacted.  We initially applied the 4 tibial tray followed by the 10 mm polyethylene bridging bearing in the standard plus femoral component.  These were impacted in place and any extraneous methacrylate was removed from the  periphery of the components.  Patella was applied with methacrylate and a patellar clamp.  At approximately 16 minutes, the methacrylate had matured during which time we infiltrated the joint with 0.25% Marcaine with epinephrine.  Tourniquet was then deflated.  Gross bleeders were Bovie coagulated.  We thought we had excellent alignment of the components.  Further gross bleeders were Bovie coagulated.  We then applied tranexamic acid topically under compression.  I thought we had  a nice dry field.  The deep capsule was closed with a #1 Ethibond superficial capsule closed with several layers of Vicryl, the subcutaneous with 3-0 Monocryl.  Skin was closed with skin clips.  Sterile bulky dressing was applied followed by the patient's support stocking.  The patient tolerated the procedure well with no complications.  TN/NUANCE  D:07/25/2018 T:07/25/2018 JOB:[id number]

## 2018-07-25 NOTE — Anesthesia Procedure Notes (Signed)
Spinal  Patient location during procedure: OR Start time: 07/25/2018 7:10 AM End time: 07/25/2018 7:20 AM Staffing Anesthesiologist: Janeece Riggers, MD Preanesthetic Checklist Completed: patient identified, site marked, surgical consent, pre-op evaluation, timeout performed, IV checked, risks and benefits discussed and monitors and equipment checked Spinal Block Patient position: sitting Prep: DuraPrep Patient monitoring: heart rate, cardiac monitor, continuous pulse ox and blood pressure Approach: midline Location: L3-4 Injection technique: single-shot Needle Needle type: Sprotte  Needle gauge: 24 G Needle length: 9 cm Assessment Sensory level: T4

## 2018-07-25 NOTE — H&P (Signed)
The recent History & Physical has been reviewed. I have personally examined the patient today. There is no interval change to the documented History & Physical. The patient would like to proceed with the procedure.  [name] 07/25/2018,  7:06 AM

## 2018-07-25 NOTE — Anesthesia Postprocedure Evaluation (Signed)
Anesthesia Post Note  Patient: COLBIE DANNER  Procedure(s) Performed: LEFT TOTAL KNEE ARTHROPLASTY (Left Knee)     Patient location during evaluation: PACU Anesthesia Type: MAC Level of consciousness: oriented and awake and alert Pain management: pain level controlled Vital Signs Assessment: post-procedure vital signs reviewed and stable Respiratory status: spontaneous breathing, respiratory function stable and patient connected to nasal cannula oxygen Cardiovascular status: blood pressure returned to baseline and stable Postop Assessment: no headache, no backache and no apparent nausea or vomiting Anesthetic complications: no    Last Vitals:  Vitals:   07/25/18 0940 07/25/18 0954  BP: (!) 107/52 116/64  Pulse: 61 (!) 50  Resp: 10 14  Temp: (!) 36.4 C   SpO2: 99% 97%    Last Pain:  Vitals:   07/25/18 0940  TempSrc:   PainSc: 0-No pain                 Kela Baccari

## 2018-07-25 NOTE — Progress Notes (Signed)
Physical Therapy Evaluation Patient Details Name: James Gallegos MRN: [id number] DOB: 01-Sep-1950 Today's Date: 07/25/2018   History of Present Illness  Patient is 68 y/o s/p L TKA. PMH includes L sided low back pain with L sided sciatica, HTN, HLD, GERD, and hx of R TKA.   Clinical Impression  Patient admitted to hospital secondary to problems above and with deficits below. Patient ambulated with supervision to min guard for safety with use of RW. Educated and reviewed supine HEP and knee precautions/weight bearing restrictions with patient and family member. Patient will benefit from acute physical therapy services to maximize independence and safety with functional mobility.     Follow Up Recommendations Follow surgeon's recommendation for DC plan and follow-up therapies    Equipment Recommendations  Rolling walker with 5" wheels;3in1 (PT)    Recommendations for Other Services       Precautions / Restrictions Precautions Precautions: Fall;Knee Precaution Booklet Issued: Yes (comment) Precaution Comments: reviewed knee precautions with patient and wife Restrictions Weight Bearing Restrictions: Yes LLE Weight Bearing: Partial weight bearing LLE Partial Weight Bearing Percentage or Pounds: 50%      Mobility  Bed Mobility Overal bed mobility: Needs Assistance Bed Mobility: Supine to Sit     Supine to sit: Supervision     General bed mobility comments: Patient required supervision for all bed mobility for safety  Transfers Overall transfer level: Needs assistance Equipment used: Rolling walker (2 wheeled) Transfers: Sit to/from Stand Sit to Stand: Min guard         General transfer comment: Patient required min guard to stand with RW. Cues for hand placement when using RW prior to standing.   Ambulation/Gait Ambulation/Gait assistance: Supervision;Min guard Gait Distance (Feet): 75 Feet Assistive device: Rolling walker (2 wheeled) Gait Pattern/deviations:  Step-to pattern;Decreased step length - right;Decreased stance time - left;Decreased stride length;Decreased weight shift to left;Antalgic Gait velocity: decreased Gait velocity interpretation: <1.8 ft/sec, indicate of risk for recurrent falls General Gait Details: Patient ambulated with supervision to min guard and use of RW for safety. Cues for sequencing with RW. Educated patient about maintaining weight bearing precautions during ambulation and using UEs to offload LLE. No episodes of knee buckling noted  Stairs            Wheelchair Mobility    Modified Rankin (Stroke Patients Only)       Balance Overall balance assessment: Needs assistance Sitting-balance support: Feet supported;No upper extremity supported Sitting balance-Leahy Scale: Good     Standing balance support: Bilateral upper extremity supported Standing balance-Leahy Scale: Poor Standing balance comment: reliant on BUE support and RW to maintain standing balance                             Pertinent Vitals/Pain Pain Assessment: 0-10 Pain Score: 7  Pain Location: L knee Pain Descriptors / Indicators: Operative site guarding;Guarding;Grimacing;Discomfort Pain Intervention(s): Limited activity within patient's tolerance;Monitored during session;Premedicated before session;Repositioned    Home Living Family/patient expects to be discharged to:: Private residence Living Arrangements: Spouse/significant other Available Help at Discharge: Family;Available 24 hours/day Type of Home: House Home Access: Stairs to enter Entrance Stairs-Rails: None Entrance Stairs-Number of Steps: 3 Home Layout: One level Home Equipment: Cane - single point      Prior Function Level of Independence: Independent               Hand Dominance        Extremity/Trunk Assessment  Upper Extremity Assessment Upper Extremity Assessment: Overall WFL for tasks assessed    Lower Extremity Assessment Lower  Extremity Assessment: LLE deficits/detail LLE Deficits / Details: LLE deficits consistent with post op pain and weakness(Simultaneous filing. User may not have seen previous data.) LLE Sensation: WNL    Cervical / Trunk Assessment Cervical / Trunk Assessment: Normal  Communication   Communication: No difficulties  Cognition Arousal/Alertness: Awake/alert Behavior During Therapy: WFL for tasks assessed/performed Overall Cognitive Status: Within Functional Limits for tasks assessed                                        General Comments General comments (skin integrity, edema, etc.): Patient wife in room during session    Exercises Total Joint Exercises Ankle Circles/Pumps: AROM;Both;20 reps;Supine Quad Sets: AROM;Left;10 reps;Supine Heel Slides: AROM;Left;10 reps;Supine   Assessment/Plan    PT Assessment Patient needs continued PT services  PT Problem List Decreased strength;Decreased range of motion;Decreased activity tolerance;Decreased balance;Decreased mobility;Decreased knowledge of use of DME;Decreased knowledge of precautions;Pain       PT Treatment Interventions DME instruction;Gait training;Stair training;Functional mobility training;Therapeutic activities;Therapeutic exercise;Balance training;Patient/family education    PT Goals (Current goals can be found in the Care Plan section)  Acute Rehab PT Goals Patient Stated Goal: go home PT Goal Formulation: With patient Time For Goal Achievement: 08/08/18 Potential to Achieve Goals: Good    Frequency 7X/week   Barriers to discharge        Co-evaluation               AM-PAC PT "6 Clicks" Mobility  Outcome Measure Help needed turning from your back to your side while in a flat bed without using bedrails?: None Help needed moving from lying on your back to sitting on the side of a flat bed without using bedrails?: None Help needed moving to and from a bed to a chair (including a wheelchair)?:  A Little Help needed standing up from a chair using your arms (e.g., wheelchair or bedside chair)?: A Little Help needed to walk in hospital room?: A Little Help needed climbing 3-5 steps with a railing? : A Lot 6 Click Score: 19    End of Session Equipment Utilized During Treatment: Gait belt Activity Tolerance: Patient tolerated treatment well Patient left: in chair;with call bell/phone within reach;with family/visitor present Nurse Communication: Mobility status PT Visit Diagnosis: Difficulty in walking, not elsewhere classified (R26.2);Muscle weakness (generalized) (M62.81);Pain Pain - Right/Left: Left Pain - part of body: Knee    Time: 0017-4944 PT Time Calculation (min) (ACUTE ONLY): 21 min   Charges:   PT Evaluation $PT Eval Low Complexity: 1 Low          Erick Blinks, SPT  Erick Blinks 07/25/2018, 6:37 PM

## 2018-07-26 ENCOUNTER — Other Ambulatory Visit (INDEPENDENT_AMBULATORY_CARE_PROVIDER_SITE_OTHER): Payer: Self-pay | Admitting: Orthopaedic Surgery

## 2018-07-26 ENCOUNTER — Telehealth: Payer: Self-pay | Admitting: Orthopaedic Surgery

## 2018-07-26 ENCOUNTER — Encounter (HOSPITAL_COMMUNITY): Payer: Self-pay | Admitting: Orthopaedic Surgery

## 2018-07-26 LAB — CBC
HCT: 33 % — ABNORMAL LOW (ref 39.0–52.0)
HCT: 31.5 % — ABNORMAL LOW (ref 39.0–52.0)
Hemoglobin: 11 g/dL — ABNORMAL LOW (ref 13.0–17.0)
Hemoglobin: 10.8 g/dL — ABNORMAL LOW (ref 13.0–17.0)
MCH: 31.5 pg (ref 26.0–34.0)
MCH: 31.7 pg (ref 26.0–34.0)
MCHC: 33.3 g/dL (ref 30.0–36.0)
MCHC: 34.3 g/dL (ref 30.0–36.0)
MCV: 94.6 fL (ref 80.0–100.0)
MCV: 92.4 fL (ref 80.0–100.0)
NRBC: 0 % (ref 0.0–0.2)
Platelets: 143 10*3/uL — ABNORMAL LOW (ref 150–400)
Platelets: 137 10*3/uL — ABNORMAL LOW (ref 150–400)
RBC: 3.49 MIL/uL — ABNORMAL LOW (ref 4.22–5.81)
RBC: 3.41 MIL/uL — ABNORMAL LOW (ref 4.22–5.81)
RDW: 12 % (ref 11.5–15.5)
RDW: 11.7 % (ref 11.5–15.5)
WBC: 7.7 10*3/uL (ref 4.0–10.5)
WBC: 7.7 10*3/uL (ref 4.0–10.5)
nRBC: 0 % (ref 0.0–0.2)

## 2018-07-26 LAB — BASIC METABOLIC PANEL
Anion gap: 10 (ref 5–15)
BUN: 19 mg/dL (ref 8–23)
CO2: 25 mmol/L (ref 22–32)
Calcium: 8.1 mg/dL — ABNORMAL LOW (ref 8.9–10.3)
Chloride: 101 mmol/L (ref 98–111)
Creatinine, Ser: 1.35 mg/dL — ABNORMAL HIGH (ref 0.61–1.24)
GFR calc Af Amer: >60 mL/min (ref >60)
GFR calc non Af Amer: 53 mL/min — ABNORMAL LOW (ref >60)
Glucose, Bld: 163 mg/dL — ABNORMAL HIGH (ref 70–99)
Potassium: 4.2 mmol/L (ref 3.5–5.1)
Sodium: 136 mmol/L (ref 135–145)

## 2018-07-26 LAB — URINALYSIS, ROUTINE W REFLEX MICROSCOPIC
Bilirubin Urine: NEGATIVE
Glucose, UA: NEGATIVE mg/dL
Hgb urine dipstick: NEGATIVE
Ketones, ur: NEGATIVE mg/dL
Leukocytes,Ua: NEGATIVE
Nitrite: NEGATIVE
PROTEIN: NEGATIVE mg/dL
Specific Gravity, Urine: 1.012 (ref 1.005–1.030)
pH: 5 (ref 5.0–8.0)

## 2018-07-26 MED ORDER — METHOCARBAMOL 500 MG PO TABS: 500.0000 mg | ORAL_TABLET | Freq: Three times a day (TID) | ORAL | 0 refills | Status: DC | PRN

## 2018-07-26 MED ORDER — ASPIRIN 325 MG PO TBEC: 325.0000 mg | DELAYED_RELEASE_TABLET | Freq: Every day | ORAL | 0 refills | Status: DC

## 2018-07-26 MED ORDER — ACETAMINOPHEN 325 MG PO TABS: 650.0000 mg | ORAL_TABLET | Freq: Four times a day (QID) | ORAL | Status: DC

## 2018-07-26 MED ORDER — OXYCODONE HCL 5 MG PO TABS: 5.0000 mg | ORAL_TABLET | ORAL | 0 refills | Status: DC | PRN

## 2018-07-26 MED ORDER — ACETAMINOPHEN 325 MG PO TABS
650.0000 mg | ORAL_TABLET | Freq: Once | ORAL | Status: AC
  Administered 2018-07-26: 650 mg via ORAL
  Filled 2018-07-26: qty 2

## 2018-07-26 MED FILL — METHOCARBAMOL 500 MG TABS: 500 | 10 days supply | Qty: 30 | Fill #0

## 2018-07-26 MED FILL — oxyCODONE HCL 5 MG TABS: 5 | 7 days supply | Qty: 80 | Fill #0

## 2018-07-26 NOTE — Op Note (Signed)
PATIENT ID: James Gallegos        MRN:  [id number]          DOB/AGE: [birth year] / 68 y.o.    [name], MD   Biagio Borg, PA-C [address] [place], [place]  [address]                             [phone]   PROGRESS NOTE  Subjective:  negative for Chest Pain  negative for Shortness of Breath  negative for Nausea/Vomiting   negative for Calf Pain    Tolerating Diet: yes         Patient reports pain as moderate.     Comfortable with ice to left knee and dilaudid  Objective: Vital signs in last 24 hours:    Patient Vitals for the past 24 hrs:  BP Temp Temp src Pulse Resp SpO2  07/26/18 0818 (!) 131/58 98.8 F (37.1 C) Oral 68 16 96 %  07/26/18 0419 (!) 117/56 99.1 F (37.3 C) Oral 67 18 97 %  07/25/18 2346 (!) 140/58 100.3 F (37.9 C) Oral 69 18 99 %  07/25/18 2030 (!) 128/59 98.5 F (36.9 C) Oral 63 18 99 %  07/25/18 1520 (!) 124/51 (!) 97.5 F (36.4 C) Oral 62 17 96 %  07/25/18 1347 131/66 97.7 F (36.5 C) Oral 60 18 98 %  07/25/18 1315 137/62 97.7 F (36.5 C) - - 18 -  07/25/18 1215 (!) 130/59 - - - 18 -  07/25/18 1111 120/61 - - (!) 55 16 100 %  07/25/18 1010 (!) 112/55 - - (!) 50 16 99 %  07/25/18 0954 116/64 - - (!) 50 14 97 %  07/25/18 0940 (!) 107/52 (!) 97.5 F (36.4 C) - 61 10 99 %      Intake/Output from previous day:   02/11 0701 - 02/12 0700 In: 400 [P.O.:400] Out: 1200 [Urine:1000]   Intake/Output this shift:   No intake/output data recorded.   Intake/Output      02/11 0701 - 02/12 0700 02/12 0701 - 02/13 0700   P.O. 400    Total Intake(mL/kg) 400 (4)    Urine (mL/kg/hr) 1000 (0.4)    Blood 200    Total Output 1200    Net -800            LABORATORY DATA: Recent Labs    07/26/18 0143  WBC 7.7  HGB 11.0*  HCT 33.0*  PLT 143*   Recent Labs    07/26/18 0143  NA 136  K 4.2  CL 101  CO2 25  BUN 19  CREATININE 1.35*  GLUCOSE 163*  CALCIUM 8.1*   Lab Results  Component Value Date   INR 0.98 07/14/2018   INR 0.94 05/21/2016    Recent Radiographic Studies :  Dg Chest 2 View  Result Date: 07/14/2018 CLINICAL DATA:  Preop knee replacement EXAM: CHEST - 2 VIEW COMPARISON:  05/21/2016 FINDINGS: Heart is mildly enlarged. Lungs clear. No effusions. No acute bony abnormality. IMPRESSION: Mild cardiomegaly.  No active disease. Electronically Signed   By: Rolm Baptise M.D.   On: 07/14/2018 15:24     Examination:  General appearance: alert, cooperative and no distress  Wound Exam: clean, dry, intact   Drainage:  None: wound tissue dry  Motor Exam: EHL, FHL, Anterior Tibial and Posterior Tibial Intact  Sensory Exam: Superficial Peroneal, Deep Peroneal and Tibial normal  Vascular Exam: Normal  Assessment:    1 Day Post-Op  Procedure(s) (LRB): LEFT TOTAL KNEE ARTHROPLASTY (Left)  ADDITIONAL DIAGNOSIS:  Principal Problem:   Primary osteoarthritis of left knee Active Problems:   Total knee replacement status, left     Plan: Physical Therapy as ordered Partial Weight Bearing @ 50% (PWB)  DVT Prophylaxis:  Aspirin, Foot Pumps and TED hose  DISCHARGE PLAN: Home  DISCHARGE NEEDS: HHPT, CPM, Walker and 3-in-1 comode seat   Patient's anticipated LOS is less than 2 midnights, meeting these requirements: - Younger than 27 - Lives within 1 hour of care - Has a competent adult at home to recover with post-op recover - NO history of  - Chronic pain requiring opiods  - Diabetes  - Coronary Artery Disease  - Heart failure  - Heart attack  - Stroke  - DVT/VTE  - Cardiac arrhythmia  - Respiratory Failure/COPD  - Renal failure  - Anemia  - Advanced Liver disease  Dressing changed. Left knee wound clean and dry. No calf pain. Will discharge after PT to home         Biagio Borg, PA-C [place]  07/26/2018 8:25 AM

## 2018-07-26 NOTE — Progress Notes (Signed)
Provided discharge education/instructions, all questions and concerns addressed, Pt not in distress,no fever, no chills, no SOB, discharged home with belongings accompanied by wife.

## 2018-07-26 NOTE — Progress Notes (Signed)
Pt's temp at 13:58=101.41F, encouraged deep breathing exercises and use of IS, adjusted room temp, obtained order for tylenol 650mg  once, UA sent to lab, called phlebotomist, awaiting for blood draw for CBC. Will continue to monitor.

## 2018-07-26 NOTE — Progress Notes (Signed)
Pt's temp at 15:46=98.22F, can reach 2500 on IS, denies SOB, no chills, UA and CBC result obtained. Pt stated he feels better.

## 2018-07-26 NOTE — Progress Notes (Signed)
Physical Therapy Treatment Patient Details Name: James Gallegos MRN: [id number] DOB: [birth year] Today's Date: 07/26/2018    History of Present Illness Patient is 68 y/o s/p L TKA. PMH includes L sided low back pain with L sided sciatica, HTN, HLD, GERD, and hx of R TKA.     PT Comments    Pt with slight fever and c/o of pain on entry, however eager for stair training so that he can go home. Pt requires supervision for bed mobility, minA for transfers, min guard for ambulation and minA for ascent/descent of 4 steps with RW. Pt able to complete limited exercise before RN came requiring urine and blood samples due to fever. Pt is able to perform mobility with minA, family educated to that fact. PT will continue to follow acutely if pt does not discharge home this afternoon.     Follow Up Recommendations  Follow surgeon's recommendation for DC plan and follow-up therapies     Equipment Recommendations  Rolling walker with 5" wheels;3in1 (PT)    Recommendations for Other Services       Precautions / Restrictions Precautions Precautions: Fall;Knee Precaution Booklet Issued: Yes (comment) Precaution Comments: reviewed knee precautions with patient and wife Restrictions Weight Bearing Restrictions: Yes LLE Weight Bearing: Partial weight bearing LLE Partial Weight Bearing Percentage or Pounds: 50%    Mobility  Bed Mobility Overal bed mobility: Needs Assistance Bed Mobility: Supine to Sit     Supine to sit: Supervision     General bed mobility comments: supervision for coming to sit EoB, vc for utilizing R LE to push into bed surface to more easily slide L LE across bed to floor  Transfers Overall transfer level: Needs assistance Equipment used: Rolling walker (2 wheeled) Transfers: Sit to/from Stand Sit to Stand: Min assist         General transfer comment: minA for power up and steadying with RW, vc for hand placement for power up   Ambulation/Gait Ambulation/Gait  assistance: Min guard Gait Distance (Feet): 20 Feet Assistive device: Rolling walker (2 wheeled) Gait Pattern/deviations: Step-to pattern;Decreased step length - right;Decreased stance time - left;Decreased stride length;Decreased weight shift to left;Antalgic Gait velocity: decreased Gait velocity interpretation: <1.8 ft/sec, indicate of risk for recurrent falls General Gait Details: min guard for safety, vc for upright posture, and proximity to RW   Stairs Stairs: Yes Stairs assistance: Min assist Stair Management: No rails;Backwards;Forwards;With walker;Step to pattern Number of Stairs: 4 General stair comments: minA for steadying with ascent/descent of 4 steps, vc for sequencing, hand placement and foot placement       Balance Overall balance assessment: Needs assistance Sitting-balance support: Feet supported;No upper extremity supported Sitting balance-Leahy Scale: Good     Standing balance support: Bilateral upper extremity supported Standing balance-Leahy Scale: Poor Standing balance comment: reliant on BUE support and RW to maintain standing balance                            Cognition Arousal/Alertness: Awake/alert Behavior During Therapy: WFL for tasks assessed/performed Overall Cognitive Status: Within Functional Limits for tasks assessed                                        Exercises Total Joint Exercises Quad Sets: AROM;5 reps;Left;Seated Knee Flexion: AROM;Seated;5 reps Goniometric ROM: ROM grossly assessed at 6 to 60 degrees  General Comments General comments (skin integrity, edema, etc.): Patient wife present throughout session      Pertinent Vitals/Pain Pain Assessment: 0-10 Pain Score: 7  Pain Location: L knee Pain Descriptors / Indicators: Operative site guarding;Guarding;Grimacing;Discomfort Pain Intervention(s): Limited activity within patient's tolerance;Monitored during session;Premedicated before  session;Repositioned           PT Goals (current goals can now be found in the care plan section) Acute Rehab PT Goals Patient Stated Goal: go home PT Goal Formulation: With patient Time For Goal Achievement: 08/08/18 Potential to Achieve Goals: Good Progress towards PT goals: Progressing toward goals    Frequency    7X/week      PT Plan Current plan remains appropriate       AM-PAC PT "6 Clicks" Mobility   Outcome Measure  Help needed turning from your back to your side while in a flat bed without using bedrails?: None Help needed moving from lying on your back to sitting on the side of a flat bed without using bedrails?: None Help needed moving to and from a bed to a chair (including a wheelchair)?: A Little Help needed standing up from a chair using your arms (e.g., wheelchair or bedside chair)?: A Little Help needed to walk in hospital room?: A Little Help needed climbing 3-5 steps with a railing? : A Lot 6 Click Score: 19    End of Session Equipment Utilized During Treatment: Gait belt Activity Tolerance: Patient tolerated treatment well Patient left: in chair;with call bell/phone within reach;with family/visitor present Nurse Communication: Mobility status PT Visit Diagnosis: Difficulty in walking, not elsewhere classified (R26.2);Muscle weakness (generalized) (M62.81);Pain Pain - Right/Left: Left Pain - part of body: Knee     Time: 8366-2947 PT Time Calculation (min) (ACUTE ONLY): 23 min  Charges:  $Gait Training: 8-22 mins $Therapeutic Exercise: 8-22 mins                     Darlisa Spruiell B. Migdalia Dk PT, DPT Acute Rehabilitation Services Pager [phone] Office [phone]    [place] 07/26/2018, 3:31 PM

## 2018-07-26 NOTE — Progress Notes (Signed)
Physical Therapy Treatment Patient Details Name: James Gallegos MRN: [id number] DOB: September 29, 1950 Today's Date: 07/26/2018    History of Present Illness Patient is 68 y/o s/p L TKA. PMH includes L sided low back pain with L sided sciatica, HTN, HLD, GERD, and hx of R TKA.     PT Comments    Pt found in bed with CPM on with c/o of 7/10 pain. Pt encouraged to perform supine therex and then to go for walk with therapy. Pt is limited in safe mobility by increased L LE pain, PWB 50% in L LE as well as decreased strength and ROM. Pt currently supervision for bed mobility, and min guard for transfers and ambulation of 25 feet with RW. Pt with 10/10 pain with ambulation. RN notified. PT will schedule afternoon session post pain medication for maximal success with stair training prior to d/c home.     Follow Up Recommendations  Follow surgeon's recommendation for DC plan and follow-up therapies     Equipment Recommendations  Rolling walker with 5" wheels;3in1 (PT)       Precautions / Restrictions Precautions Precautions: Fall;Knee Precaution Booklet Issued: Yes (comment) Precaution Comments: reviewed knee precautions with patient and wife Restrictions Weight Bearing Restrictions: Yes LLE Weight Bearing: Partial weight bearing LLE Partial Weight Bearing Percentage or Pounds: 50%    Mobility  Bed Mobility Overal bed mobility: Needs Assistance Bed Mobility: Supine to Sit     Supine to sit: Supervision     General bed mobility comments: supervision for coming to sit EoB, vc for utilizing R LE to push into bed surface to more easily slide L LE across bed to floor  Transfers Overall transfer level: Needs assistance Equipment used: Rolling walker (2 wheeled) Transfers: Sit to/from Stand Sit to Stand: Min guard         General transfer comment: min guard for safety, pt able to self correct hand placement before power up  Ambulation/Gait Ambulation/Gait assistance: Min guard Gait  Distance (Feet): 25 Feet Assistive device: Rolling walker (2 wheeled) Gait Pattern/deviations: Step-to pattern;Decreased step length - right;Decreased stance time - left;Decreased stride length;Decreased weight shift to left;Antalgic Gait velocity: decreased Gait velocity interpretation: <1.8 ft/sec, indicate of risk for recurrent falls General Gait Details: min guard for safety, decreased distance of ambulation due to 10/10 pain and decreased ability to maintain 50% PWB through L LE       Balance Overall balance assessment: Needs assistance Sitting-balance support: Feet supported;No upper extremity supported Sitting balance-Leahy Scale: Good     Standing balance support: Bilateral upper extremity supported Standing balance-Leahy Scale: Poor Standing balance comment: reliant on BUE support and RW to maintain standing balance                            Cognition Arousal/Alertness: Awake/alert Behavior During Therapy: WFL for tasks assessed/performed Overall Cognitive Status: Within Functional Limits for tasks assessed                                        Exercises Total Joint Exercises Ankle Circles/Pumps: AROM;Both;20 reps;Supine Quad Sets: AROM;Left;10 reps;Supine Heel Slides: AROM;Left;10 reps;Supine Long Arc Quad: AROM;10 reps;Seated Knee Flexion: AROM;10 reps;Seated    General Comments General comments (skin integrity, edema, etc.): Patient wife in room throughout session      Pertinent Vitals/Pain Pain Assessment: 0-10 Pain Score: 10-Worst pain ever  Pain Location: L knee Pain Descriptors / Indicators: Operative site guarding;Guarding;Grimacing;Discomfort Pain Intervention(s): Limited activity within patient's tolerance;Monitored during session;Repositioned;Patient requesting pain meds-RN notified           PT Goals (current goals can now be found in the care plan section) Acute Rehab PT Goals Patient Stated Goal: go home PT Goal  Formulation: With patient Time For Goal Achievement: 08/08/18 Potential to Achieve Goals: Good Progress towards PT goals: Not progressing toward goals - comment(limited in participation due to increased pain)    Frequency    7X/week      PT Plan Current plan remains appropriate       AM-PAC PT "6 Clicks" Mobility   Outcome Measure  Help needed turning from your back to your side while in a flat bed without using bedrails?: None Help needed moving from lying on your back to sitting on the side of a flat bed without using bedrails?: None Help needed moving to and from a bed to a chair (including a wheelchair)?: A Little Help needed standing up from a chair using your arms (e.g., wheelchair or bedside chair)?: A Little Help needed to walk in hospital room?: A Little Help needed climbing 3-5 steps with a railing? : A Lot 6 Click Score: 19    End of Session Equipment Utilized During Treatment: Gait belt Activity Tolerance: Patient tolerated treatment well Patient left: in chair;with call bell/phone within reach;with family/visitor present Nurse Communication: Mobility status PT Visit Diagnosis: Difficulty in walking, not elsewhere classified (R26.2);Muscle weakness (generalized) (M62.81);Pain Pain - Right/Left: Left Pain - part of body: Knee     Time: 8325-4982 PT Time Calculation (min) (ACUTE ONLY): 33 min  Charges:  $Gait Training: 8-22 mins $Therapeutic Exercise: 8-22 mins                     Beverlyann Broxterman B. Migdalia Dk PT, DPT Acute Rehabilitation Services Pager [phone] Office [phone]    [place] 07/26/2018, 9:42 AM

## 2018-07-26 NOTE — Discharge Summary (Signed)
[name], MD   Biagio Borg, PA-C [address], [place], [place]  [address]                             [phone]  PATIENT ID: James Gallegos        MRN:  [id number]          DOB/AGE: 08/08/1950 / 68 y.o.    DISCHARGE SUMMARY  ADMISSION DATE:    07/25/2018 DISCHARGE DATE:   07/26/2018   ADMISSION DIAGNOSIS: left knee osteoarthritis    DISCHARGE DIAGNOSIS:  left knee osteoarthritis    ADDITIONAL DIAGNOSIS: Principal Problem:   Primary osteoarthritis of left knee Active Problems:   Total knee replacement status, left  Past Medical History:  Diagnosis Date  . Arthritis    back & L knee, R foot - lis franc   . Deep vein thrombosis ([place])    after knee surgery in 2004  . GERD (gastroesophageal reflux disease)    only with pizza   . Hypertension   . Murmur, cardiac    pt. reports its difficult to auscultate   . Sleep apnea    CPAP- last study- 2014    PROCEDURE: Procedure(s): LEFT TOTAL KNEE ARTHROPLASTY  on 07/25/2018  CONSULTS: none    HISTORY: Danielle Rankin, 68 y.o. male, has a history of pain and functional disability in the left knee due to arthritis and has failed non-surgical conservative treatments for greater than 12 weeks to includeNSAID's and/or analgesics, corticosteriod injections, flexibility and strengthening excercises, weight reduction as appropriate and activity modification.  Onset of symptoms was gradual, starting 2 years ago with gradually worsening course since that time. The patient noted prior procedures on the knee to include  arthroscopy and menisectomy on the left knee(s).  Patient currently rates pain in the left knee(s) at 7 out of 10 with activity. Patient has night pain, worsening of pain with activity and weight bearing, pain that interferes with activities of daily living, pain with passive range of motion and joint swelling.  Patient has evidence of subchondral sclerosis, periarticular osteophytes, joint subluxation and joint  space narrowing by imaging studies. This patient has had . There is no active infection.  HOSPITAL COURSE:  FRANKI STEMEN is a 68 y.o. admitted on 07/25/2018 and found to have a diagnosis of left knee osteoarthritis.  After appropriate laboratory studies were obtained  they were taken to the operating room on 07/25/2018 and underwent  Procedure(s): LEFT TOTAL KNEE ARTHROPLASTY  .   They were given perioperative antibiotics:  Anti-infectives (From admission, onward)   Start     Dose/Rate Route Frequency Ordered Stop   07/25/18 1500  ceFAZolin (ANCEF) IVPB 2g/100 mL premix     2 g 200 mL/hr over 30 Minutes Intravenous Every 6 hours 07/25/18 1344 07/25/18 2206   07/25/18 0600  ceFAZolin (ANCEF) IVPB 2g/100 mL premix     2 g 200 mL/hr over 30 Minutes Intravenous To Short Stay 07/24/18 1224 07/25/18 0725    .  Tolerated the procedure well. Toradol was given post op.  POD #1, allowed out of bed to a chair.  PT for ambulation and exercise program.    IV saline locked.  O2 discontionued.  The remainder of the hospital course was dedicated to ambulation and strengthening.   The patient was discharged on 1 Day Post-Op in  Stable condition.  Blood products given:none  DIAGNOSTIC STUDIES: Recent vital signs:  Patient Vitals for the past 24 hrs:  BP Temp Temp src Pulse Resp SpO2  07/26/18 0818 (!) 131/58 98.8 F (37.1 C) Oral 68 16 96 %  07/26/18 0419 (!) 117/56 99.1 F (37.3 C) Oral 67 18 97 %  07/25/18 2346 (!) 140/58 100.3 F (37.9 C) Oral 69 18 99 %  07/25/18 2030 (!) 128/59 98.5 F (36.9 C) Oral 63 18 99 %  07/25/18 1520 (!) 124/51 (!) 97.5 F (36.4 C) Oral 62 17 96 %  07/25/18 1347 131/66 97.7 F (36.5 C) Oral 60 18 98 %  07/25/18 1315 137/62 97.7 F (36.5 C) - - 18 -       Recent laboratory studies: Recent Labs    07/26/18 0143  WBC 7.7  HGB 11.0*  HCT 33.0*  PLT 143*   Recent Labs    07/26/18 0143  NA 136  K 4.2  CL 101  CO2 25  BUN 19  CREATININE 1.35*    GLUCOSE 163*  CALCIUM 8.1*   Lab Results  Component Value Date   INR 0.98 07/14/2018   INR 0.94 05/21/2016     Recent Radiographic Studies :  Dg Chest 2 View  Result Date: 07/14/2018 CLINICAL DATA:  Preop knee replacement EXAM: CHEST - 2 VIEW COMPARISON:  05/21/2016 FINDINGS: Heart is mildly enlarged. Lungs clear. No effusions. No acute bony abnormality. IMPRESSION: Mild cardiomegaly.  No active disease. Electronically Signed   By: Rolm Baptise M.D.   On: 07/14/2018 15:24    DISCHARGE INSTRUCTIONS: Discharge Instructions    CPM   Complete by:  As directed    Continuous passive motion machine (CPM):      Use the CPM from 0 to 60 degrees for 6-8 hours per day.      You may increase by 5-10 per day.  You may break it up into 2 or 3 sessions per day.      Use CPM for 3-4 weeks or until you are told to stop.   Call MD / Call 911   Complete by:  As directed    If you experience chest pain or shortness of breath, CALL 911 and be transported to the hospital emergency room.  If you develope a fever above 101 F, pus (white drainage) or increased drainage or redness at the wound, or calf pain, call your surgeon's office.   Change dressing   Complete by:  As directed    DO NOT CHANGE YOUR DRESSING.   Constipation Prevention   Complete by:  As directed    Drink plenty of fluids.  Prune juice may be helpful.  You may use a stool softener, such as Colace (over the counter) 100 mg twice a day.  Use MiraLax (over the counter) for constipation as needed.   Diet general   Complete by:  As directed    Discharge instructions   Complete by:  As directed    [place] items at home which could result in a fall. This includes throw rugs or furniture in walking pathways ICE to the affected joint every three hours while awake for 30 minutes at a time, for at least the first 3-5 days, and then as needed for pain and swelling.  Continue to use ice for pain and swelling.  You may notice swelling that will progress down to the foot and ankle.  This is normal after surgery.  Elevate your leg when you are not up walking on it.  Continue to use the breathing machine you got in the hospital (incentive spirometer) which will help keep your temperature down.  It is common for your temperature to cycle up and down following surgery, especially at night when you are not up moving around and exerting yourself.  The breathing machine keeps your lungs expanded and your temperature down.   DIET:  As you were doing prior to hospitalization, we recommend a well-balanced diet.  DRESSING / WOUND CARE / SHOWERING  Keep the surgical dressing until follow up.  The dressing is water proof, so you can shower without any extra covering.  IF THE DRESSING FALLS OFF or the wound gets wet inside, change the dressing with sterile gauze.  Please use good hand washing techniques before changing the dressing.  Do not use any lotions or creams on the incision until instructed by your surgeon.    ACTIVITY  Increase activity slowly as tolerated, but follow the weight bearing instructions below.   No driving for 6 weeks or until further direction given by your physician.  You cannot drive while taking narcotics.  No lifting or carrying greater than 10 lbs. until further directed by your surgeon. Avoid periods of inactivity such as sitting longer than an hour when not asleep. This helps prevent blood clots.  You may return to work once you are authorized by your doctor.     WEIGHT BEARING   Partial weight bearing with assist device as directed.  50%   EXERCISES  Results after joint replacement surgery are often greatly improved when you follow the exercise, range of motion and muscle strengthening exercises prescribed by your doctor. Safety measures are also important to protect the joint from further injury. Any time any of these exercises cause you to have increased pain or swelling,  decrease what you are doing until you are comfortable again and then slowly increase them. If you have problems or questions, call your caregiver or physical therapist for advice.   Rehabilitation is important following a joint replacement. After just a few days of immobilization, the muscles of the leg can become weakened and shrink (atrophy).  These exercises are designed to build up the tone and strength of the thigh and leg muscles and to improve motion. Often times heat used for twenty to thirty minutes before working out will loosen up your tissues and help with improving the range of motion but do not use heat for the first two weeks following surgery (sometimes heat can increase post-operative swelling).   These exercises can be done on a training (exercise) mat,  on a table or on a bed. Use whatever works the best and is most comfortable for you.    Use music or television while you are exercising so that the exercises are a pleasant break in your day. This will make your life better with the exercises acting as a break in your routine that you can look forward to.   Perform all exercises about fifteen times, three times per day or as directed.  You should exercise both the operative leg and the other leg as well.   Exercises include:  Quad Sets - Tighten up the muscle on the front of the thigh (Quad) and hold for 5-10 seconds.   Straight Leg Raises - With your knee straight (if you were given a brace, keep it on), lift the leg to 60 degrees, hold for 3 seconds, and slowly lower the leg.  Perform this exercise against resistance later as  your leg gets stronger.  Leg Slides: Lying on your back, slowly slide your foot toward your buttocks, bending your knee up off the floor (only go as far as is comfortable). Then slowly slide your foot back down until your leg is flat on the floor again.  Angel Wings: Lying on your back spread your legs to the side as far apart as you can without causing  discomfort.  Hamstring Strength:  Lying on your back, push your heel against the floor with your leg straight by tightening up the muscles of your buttocks.  Repeat, but this time bend your knee to a comfortable angle, and push your heel against the floor.  You may put a pillow under the heel to make it more comfortable if necessary.   A rehabilitation program following joint replacement surgery can speed recovery and prevent re-injury in the future due to weakened muscles. Contact your doctor or a physical therapist for more information on knee rehabilitation.    CONSTIPATION  Constipation is defined medically as fewer than three stools per week and severe constipation as less than one stool per week.  Even if you have a regular bowel pattern at home, your normal regimen is likely to be disrupted due to multiple reasons following surgery.  Combination of anesthesia, postoperative narcotics, change in appetite and fluid intake all can affect your bowels.   YOU MUST use at least one of the following options; they are listed in order of increasing strength to get the job done.  They are all available over the counter, and you may need to use some, POSSIBLY even all of these options:    Drink plenty of fluids (prune juice may be helpful) and high fiber foods Colace 100 mg by mouth twice a day  Senokot for constipation as directed and as needed Dulcolax (bisacodyl), take with full glass of water  Miralax (polyethylene glycol) once or twice a day as needed.  If you have tried all these things and are unable to have a bowel movement in the first 3-4 days after surgery call either your surgeon or your primary doctor.    If you experience loose stools or diarrhea, hold the medications until you stool forms back up.  If your symptoms do not get better within 1 week or if they get worse, check with your doctor.  If you experience "the worst abdominal pain ever" or develop nausea or vomiting, please contact  the office immediately for further recommendations for treatment.   ITCHING:  If you experience itching with your medications, try taking only a single pain pill, or even half a pain pill at a time.  You can also use Benadryl over the counter for itching or also to help with sleep.   TED HOSE STOCKINGS:  Use stockings on both legs until for at least 2 weeks or as directed by physician office. They may be removed at night for sleeping.  MEDICATIONS:  See your medication summary on the "After Visit Summary" that nursing will review with you.  You may have some home medications which will be placed on hold until you complete the course of blood thinner medication.  It is important for you to complete the blood thinner medication as prescribed.  PRECAUTIONS:  If you experience chest pain or shortness of breath - call 911 immediately for transfer to the hospital emergency department.   If you develop a fever greater that 101 F, purulent drainage from wound, increased redness or drainage from  wound, foul odor from the wound/dressing, or calf pain - CONTACT YOUR SURGEON.                                                   FOLLOW-UP APPOINTMENTS:  If you do not already have a post-op appointment, please call the office for an appointment to be seen by your surgeon.  Guidelines for how soon to be seen are listed in your "After Visit Summary", but are typically between 1-4 weeks after surgery.  OTHER INSTRUCTIONS:   Knee Replacement:  Do not place pillow under knee, focus on keeping the knee straight while resting. CPM instructions: 0-90 degrees, 2 hours in the morning, 2 hours in the afternoon, and 2 hours in the evening. Place foam block, curve side up under heel at all times except when in CPM or when walking.  DO NOT modify, tear, cut, or change the foam block in any way.  MAKE SURE YOU:  Understand these instructions.  Get help right away if you are not doing well or get worse.    Thank you for  letting us be a part of your medical care team.  It is a privilege we respect greatly.  We hope these instructions will help you stay on track for a fast and full recovery!   Do not put a pillow under the knee. Place it under the heel.   Complete by:  As directed    Driving restrictions   Complete by:  As directed    No driving for 6 weeks   Increase activity slowly as tolerated   Complete by:  As directed    Lifting restrictions   Complete by:  As directed    No lifting for 6 weeks   Partial weight bearing   Complete by:  As directed    % Body Weight:  50%   Laterality:  left   Extremity:  Lower   Patient may shower   Complete by:  As directed    You may shower over your dressing   TED hose   Complete by:  As directed    Use stockings (TED hose) for 3 weeks on left  leg.  You may remove them at night for sleeping.      DISCHARGE MEDICATIONS:   Allergies as of 07/26/2018   No Known Allergies     Medication List    STOP taking these medications   amoxicillin 500 MG capsule Commonly known as:  AMOXIL   ibuprofen 600 MG tablet Commonly known as:  ADVIL,MOTRIN   traMADol 50 MG tablet Commonly known as:  ULTRAM     TAKE these medications   acetaminophen 325 MG tablet Commonly known as:  TYLENOL Take 2 tablets (650 mg total) by mouth every 6 (six) hours.   aspirin 325 MG EC tablet Take 1 tablet (325 mg total) by mouth daily with breakfast. Start taking on:  July 27, 2018   B-complex with vitamin C tablet Take 1 tablet by mouth daily.   DHEA 50 MG Caps Take 100 mg by mouth daily.   Diindolylmethane Powd Take 250 mg by mouth daily.   fluticasone 50 MCG/ACT nasal spray Commonly known as:  FLONASE Place 1 spray into both nostrils daily as needed for allergies or rhinitis.   Krill Oil 500 MG Caps Take 500 mg by  mouth daily.   losartan-hydrochlorothiazide 100-25 MG tablet Commonly known as:  HYZAAR TAKE 1 TABLET BY MOUTH DAILY.   Lysine HCl 1000 MG  Tabs Take 2,000 mg by mouth daily.   Magnesium 400 MG Tabs Take by mouth daily at 8 pm.   methocarbamol 500 MG tablet Commonly known as:  ROBAXIN Take 1 tablet (500 mg total) by mouth every 8 (eight) hours as needed for muscle spasms.   oxyCODONE 5 MG immediate release tablet Commonly known as:  Oxy IR/ROXICODONE Take 1-2 tablets (5-10 mg total) by mouth every 4 (four) hours as needed for moderate pain (pain score 4-6).   QUERCETIN PO Take 1,000 mg by mouth daily.   saw palmetto 160 MG capsule Take 320 mg by mouth daily.   Vitamin B-12 5000 MCG Tbdp Take 5,000 mcg by mouth daily.   vitamin C 1000 MG tablet Take 5,000 mg by mouth daily.   Vitamin D-3 125 MCG (5000 UT) Tabs Take 5,000 Units by mouth daily.            Durable Medical Equipment  (From admission, onward)         Start     Ordered   07/25/18 1345  DME Walker rolling  Once    Question:  Patient needs a walker to treat with the following condition  Answer:  S/P total knee replacement using cement, left   07/25/18 1344   07/25/18 1345  DME 3 n 1  Once     07/25/18 1344   07/25/18 1345  DME Bedside commode  Once    Question:  Patient needs a bedside commode to treat with the following condition  Answer:  Total knee replacement status, left   07/25/18 1344           Discharge Care Instructions  (From admission, onward)         Start     Ordered   07/26/18 0000  Partial weight bearing    Question Answer Comment  % Body Weight 50%   Laterality left   Extremity Lower      07/26/18 1234   07/26/18 0000  Change dressing    Comments:  DO NOT CHANGE YOUR DRESSING.   07/26/18 1234          FOLLOW UP VISIT:   Follow-up Information    [facility] Follow up.   Specialty:  Edna Why:  A representative from [facility] will contact you to arrange start date and time for your therapy. Contact information: [place] [address] [phone]            DISPOSITION:   Home  CONDITION:  Stable   Mike Craze. [place], [place] [phone]  07/26/2018 12:35 PM

## 2018-07-26 NOTE — Plan of Care (Signed)

## 2018-07-26 NOTE — Care Management Note (Signed)
Case Management Note  Patient Details  Name: LINNIE MCGLOCKLIN MRN: [id number] Date of Birth: 07-25-1950  Subjective/Objective:    68 yr old gentleman s/p left total knee arthroplasty.  Action/Plan: Patient was preoperatively setup with [facility], no changes. He will have support at discharge.    Expected Discharge Date:  07/26/18               Expected Discharge Plan:  [place]  In-House Referral:  NA  Discharge planning Services  CM Consult  Post Acute Care Choice:  Home Health Choice offered to:  Patient  DME Arranged:  N/A(has DME ) DME Agency:  [employer]  HH Arranged:  PT [place] Agency:  [facility] (formerly [employer])  Status of Service:  Completed, signed off  If discussed at [employer], dates discussed:    Additional Comments:  Ninfa Meeker, RN 07/26/2018, 11:04 AM

## 2018-07-27 ENCOUNTER — Telehealth (INDEPENDENT_AMBULATORY_CARE_PROVIDER_SITE_OTHER): Payer: Self-pay | Admitting: Orthopaedic Surgery

## 2018-07-27 DIAGNOSIS — M1712 Unilateral primary osteoarthritis, left knee: Secondary | ICD-10-CM | POA: Diagnosis not present

## 2018-07-27 DIAGNOSIS — Z96652 Presence of left artificial knee joint: Secondary | ICD-10-CM | POA: Diagnosis not present

## 2018-07-27 NOTE — Telephone Encounter (Signed)
thanks

## 2018-07-27 NOTE — Telephone Encounter (Signed)
James Gallegos w/[employer] responded with the following:  We could not get hold of patient before the surgery, but spoke with the patient yesterday in the hospital and then patient was set up this morning.  It seems that patient had a question about home  health ([facility]), and not suppliers of the CPM ([employer]). A demographic sheet was printed for [facility] ([facility]) and put in the folder for pick up on same day in which surgery was scheduled. I will contact [facility] and see that services are in order.

## 2018-07-27 NOTE — Telephone Encounter (Signed)
Order for CPM was faxed to [facility] 07-10-18 on the same day the surgery was scheduled. (confirmation received).  I've sent a text to [facility] w/[employer].  Will keep you and the patient posted.

## 2018-07-27 NOTE — Telephone Encounter (Signed)
Please advise 

## 2018-07-27 NOTE — Telephone Encounter (Signed)
Patient states he spoke with someone at [facility] this am to find out where they were with the CPM machine, and was told they did not have an order for one. Please call [facility], and call patient to address.

## 2018-07-27 NOTE — Telephone Encounter (Signed)
Patient left voicemail requesting a return call.

## 2018-07-27 NOTE — Telephone Encounter (Signed)
Thanks

## 2018-07-27 NOTE — Telephone Encounter (Signed)
Please take care of this.  Thank you

## 2018-07-28 ENCOUNTER — Other Ambulatory Visit: Payer: Self-pay | Admitting: *Deleted

## 2018-07-28 ENCOUNTER — Telehealth (INDEPENDENT_AMBULATORY_CARE_PROVIDER_SITE_OTHER): Payer: Self-pay | Admitting: Orthopaedic Surgery

## 2018-07-28 DIAGNOSIS — Z471 Aftercare following joint replacement surgery: Secondary | ICD-10-CM | POA: Diagnosis not present

## 2018-07-28 DIAGNOSIS — Z86718 Personal history of other venous thrombosis and embolism: Secondary | ICD-10-CM | POA: Diagnosis not present

## 2018-07-28 DIAGNOSIS — Z9181 History of falling: Secondary | ICD-10-CM | POA: Diagnosis not present

## 2018-07-28 DIAGNOSIS — I10 Essential (primary) hypertension: Secondary | ICD-10-CM | POA: Diagnosis not present

## 2018-07-28 DIAGNOSIS — Z96653 Presence of artificial knee joint, bilateral: Secondary | ICD-10-CM | POA: Diagnosis not present

## 2018-07-28 DIAGNOSIS — G4730 Sleep apnea, unspecified: Secondary | ICD-10-CM | POA: Diagnosis not present

## 2018-07-28 NOTE — Patient Outreach (Signed)
[facility]) Care Management  07/28/2018  James Gallegos April 09, 1951 [id number]   Transition of care telephone call  Referral received: 07/20/18 Initial outreach: 07/24/18 for pre-op call Insurance: [facility] Health Choice Plan   Subjective: Initial unsuccessful telephone call to patient's preferred number in order to complete transition of care assessment; no answer, left HIPAA compliant voicemail message requesting return call.   Objective: Per the electronic medical record, James Gallegos had left total knee arthroplasty  on 07/25/18 at [facility]. He was discharged to home on 07/26/18 with orders for home health physical therapy services to be provided by [facility]. Comorbidities include: HTN, sleep apnea, hyperlipidemia, s/p right total knee replacement   Plan: If no return call from patient, this RNCM will attempt another outreach within 4 business days. This RNCM will route unsuccessful outreach letter with [place] Management pamphlet and 24 hour Nurse Advice Line Magnet to [place] Management clinical pool to be mailed to patient's home address.   Barrington Ellison RN,CCM,CDE [place] Management Coordinator Office Phone [phone] Office Fax [phone]

## 2018-07-28 NOTE — Telephone Encounter (Signed)
Waldon Reining physical therapist at [facility] left a voicemail requesting verbal order for frequency 1 week 1, 3 week 1, 1 week 1.   Please call [phone]

## 2018-07-28 NOTE — Patient Outreach (Addendum)
[facility]) Care Management  07/28/2018  James Gallegos 1951/02/03 [id number]   Transition of care telephone call  Referral received: 07/20/18 Initial outreach: 07/24/18 for pre-op call Surgery date: 07/25/18 Insurance: [facility] Health Choice Plan   Subjective: [name] returned call to this [facility]; 2 HIPAA identifiers verified. Explained purpose of call and completed transition of care assessment.  [name] states he is doing well, feels much better with this knee surgery than he did with his right knee surgery in December of 2017.  He had no questions or concerns. He says he had his first home health physical therapy session today and it went well.  Objective: [name] hadleft total knee arthroplasty on 07/25/18 at [facility]. He was discharged to home on 07/26/18 with orders for home health physical therapy services provided by [facility]. Comorbidities include: HTN, sleep apnea- uses CPAP, hyperlipidemia, s/p right total knee replacement December 2017.  Assessment:  Patient voices good understanding of all discharge instructions.  See transition of care flowsheet for assessment details.  Plan: No ongoing care management needs identified so will close case to [place] Management care management services and route successful outreach letter with [place] Management pamphlet and 24 Hour Nurse Line Magnet to [place] Management clinical pool to be mailed to patient's home address.   Barrington Ellison RN,CCM,CDE [place] Management Coordinator Office Phone [phone] Office Fax [phone]

## 2018-07-31 DIAGNOSIS — Z471 Aftercare following joint replacement surgery: Secondary | ICD-10-CM | POA: Diagnosis not present

## 2018-07-31 DIAGNOSIS — Z9181 History of falling: Secondary | ICD-10-CM | POA: Diagnosis not present

## 2018-07-31 DIAGNOSIS — Z86718 Personal history of other venous thrombosis and embolism: Secondary | ICD-10-CM | POA: Diagnosis not present

## 2018-07-31 DIAGNOSIS — Z96653 Presence of artificial knee joint, bilateral: Secondary | ICD-10-CM | POA: Diagnosis not present

## 2018-07-31 DIAGNOSIS — I10 Essential (primary) hypertension: Secondary | ICD-10-CM | POA: Diagnosis not present

## 2018-07-31 DIAGNOSIS — G4730 Sleep apnea, unspecified: Secondary | ICD-10-CM | POA: Diagnosis not present

## 2018-07-31 NOTE — Telephone Encounter (Signed)
Please advise 

## 2018-07-31 NOTE — Telephone Encounter (Signed)
ok 

## 2018-07-31 NOTE — Telephone Encounter (Signed)
LMOM for [facility] [employer]

## 2018-08-01 ENCOUNTER — Ambulatory Visit (INDEPENDENT_AMBULATORY_CARE_PROVIDER_SITE_OTHER): Payer: 59 | Admitting: Orthopedic Surgery

## 2018-08-02 DIAGNOSIS — Z86718 Personal history of other venous thrombosis and embolism: Secondary | ICD-10-CM | POA: Diagnosis not present

## 2018-08-02 DIAGNOSIS — Z471 Aftercare following joint replacement surgery: Secondary | ICD-10-CM | POA: Diagnosis not present

## 2018-08-02 DIAGNOSIS — G4730 Sleep apnea, unspecified: Secondary | ICD-10-CM | POA: Diagnosis not present

## 2018-08-02 DIAGNOSIS — Z96653 Presence of artificial knee joint, bilateral: Secondary | ICD-10-CM | POA: Diagnosis not present

## 2018-08-02 DIAGNOSIS — I10 Essential (primary) hypertension: Secondary | ICD-10-CM | POA: Diagnosis not present

## 2018-08-02 DIAGNOSIS — Z9181 History of falling: Secondary | ICD-10-CM | POA: Diagnosis not present

## 2018-08-03 ENCOUNTER — Other Ambulatory Visit (INDEPENDENT_AMBULATORY_CARE_PROVIDER_SITE_OTHER): Payer: Self-pay | Admitting: Orthopedic Surgery

## 2018-08-03 ENCOUNTER — Telehealth (INDEPENDENT_AMBULATORY_CARE_PROVIDER_SITE_OTHER): Payer: Self-pay | Admitting: Orthopaedic Surgery

## 2018-08-03 MED ORDER — OXYCODONE HCL 5 MG PO TABS: 5.0000 mg | ORAL_TABLET | ORAL | 0 refills | Status: DC | PRN

## 2018-08-03 MED FILL — oxyCODONE HCL 5 MG TABS: 5 | 3 days supply | Qty: 30 | Fill #0

## 2018-08-03 NOTE — Telephone Encounter (Signed)
Please advise 

## 2018-08-03 NOTE — Telephone Encounter (Signed)
Sent in to [facility] pharmacy

## 2018-08-03 NOTE — Telephone Encounter (Signed)
Called patient. Told him that his script has been sent in to pharmacy.

## 2018-08-03 NOTE — Telephone Encounter (Signed)
Patient requesting a refill on Oxyocodone, but patient ready to decrease strength if needed. Patient will be out before rov on Tuesday. He mainly uses it after PT. Patient uses [facility].

## 2018-08-03 NOTE — Telephone Encounter (Signed)
Please call patient and advise. Thank you.

## 2018-08-04 DIAGNOSIS — Z86718 Personal history of other venous thrombosis and embolism: Secondary | ICD-10-CM | POA: Diagnosis not present

## 2018-08-04 DIAGNOSIS — Z9181 History of falling: Secondary | ICD-10-CM | POA: Diagnosis not present

## 2018-08-04 DIAGNOSIS — I10 Essential (primary) hypertension: Secondary | ICD-10-CM | POA: Diagnosis not present

## 2018-08-04 DIAGNOSIS — G4730 Sleep apnea, unspecified: Secondary | ICD-10-CM | POA: Diagnosis not present

## 2018-08-04 DIAGNOSIS — Z471 Aftercare following joint replacement surgery: Secondary | ICD-10-CM | POA: Diagnosis not present

## 2018-08-04 DIAGNOSIS — Z96653 Presence of artificial knee joint, bilateral: Secondary | ICD-10-CM | POA: Diagnosis not present

## 2018-08-07 DIAGNOSIS — G4730 Sleep apnea, unspecified: Secondary | ICD-10-CM | POA: Diagnosis not present

## 2018-08-07 DIAGNOSIS — Z471 Aftercare following joint replacement surgery: Secondary | ICD-10-CM | POA: Diagnosis not present

## 2018-08-07 DIAGNOSIS — Z96653 Presence of artificial knee joint, bilateral: Secondary | ICD-10-CM | POA: Diagnosis not present

## 2018-08-07 DIAGNOSIS — Z9181 History of falling: Secondary | ICD-10-CM | POA: Diagnosis not present

## 2018-08-07 DIAGNOSIS — I10 Essential (primary) hypertension: Secondary | ICD-10-CM | POA: Diagnosis not present

## 2018-08-07 DIAGNOSIS — Z86718 Personal history of other venous thrombosis and embolism: Secondary | ICD-10-CM | POA: Diagnosis not present

## 2018-08-08 ENCOUNTER — Encounter (INDEPENDENT_AMBULATORY_CARE_PROVIDER_SITE_OTHER): Payer: Self-pay | Admitting: Orthopaedic Surgery

## 2018-08-08 ENCOUNTER — Other Ambulatory Visit (INDEPENDENT_AMBULATORY_CARE_PROVIDER_SITE_OTHER): Payer: Self-pay | Admitting: *Deleted

## 2018-08-08 ENCOUNTER — Ambulatory Visit (INDEPENDENT_AMBULATORY_CARE_PROVIDER_SITE_OTHER): Payer: Self-pay

## 2018-08-08 ENCOUNTER — Ambulatory Visit (INDEPENDENT_AMBULATORY_CARE_PROVIDER_SITE_OTHER): Payer: 59

## 2018-08-08 ENCOUNTER — Ambulatory Visit (INDEPENDENT_AMBULATORY_CARE_PROVIDER_SITE_OTHER): Payer: 59 | Admitting: Orthopaedic Surgery

## 2018-08-08 DIAGNOSIS — G8929 Other chronic pain: Secondary | ICD-10-CM

## 2018-08-08 DIAGNOSIS — Z96652 Presence of left artificial knee joint: Secondary | ICD-10-CM

## 2018-08-08 DIAGNOSIS — M25561 Pain in right knee: Secondary | ICD-10-CM

## 2018-08-08 DIAGNOSIS — M25562 Pain in left knee: Secondary | ICD-10-CM

## 2018-08-08 MED ORDER — METHOCARBAMOL 500 MG PO TABS: 500.0000 mg | ORAL_TABLET | Freq: Four times a day (QID) | ORAL | 1 refills | Status: DC

## 2018-08-08 MED ORDER — LIDOCAINE HCL 1 % IJ SOLN
2.0000 mL | INTRAMUSCULAR | Status: AC | PRN
  Administered 2018-08-08: 2 mL

## 2018-08-08 MED FILL — METHOCARBAMOL 500 MG TABS: 500 | 8 days supply | Qty: 30 | Fill #0

## 2018-08-08 NOTE — Progress Notes (Signed)
Office Visit Note   Patient: James Gallegos           Date of Birth: 06/20/1950           MRN: [id number] Visit Date: 08/08/2018              Requested by: Aldine Contes, MD [address] [place], [place] [id number] PCP: Aldine Contes, MD   Assessment & Plan: Visit Diagnoses:  1. Chronic pain of left knee   2. History of left knee replacement     Plan: 2 weeks status post primary left total knee replacement.  Staples removed and Steri-Strips applied.  I aspirated 40 cc of bloody fluid.  We will send for culture and sensitivity as the patient has had occasional temperature.  Good range of motion.  Minimal pain.  Will progress to weightbearing as tolerated with or without a cane.  Office 2 weeks.  Renew Robaxin .start outpatient PT  Follow-Up Instructions: Return in about 2 weeks (around 08/22/2018).   Orders:  Orders Placed This Encounter  Procedures  . Anaerobic and Aerobic Culture  . XR KNEE 3 VIEW LEFT  . XR KNEE 3 VIEW LEFT  . Synovial cell count + diff, w/ crystals   No orders of the defined types were placed in this encounter.     Procedures: Large Joint Inj: L knee on 08/08/2018 1:37 PM Indications: pain and diagnostic evaluation Details: 25 G 1.5 in needle, anteromedial approach  Arthrogram: No  Medications: 2 mL lidocaine 1 % Aspirate: 40 mL bloody; sent for lab analysis Procedure, treatment alternatives, risks and benefits explained, specific risks discussed. Consent was given by the patient. Patient was prepped and draped in the usual sterile fashion.       Clinical Data: No additional findings.   Subjective: No chief complaint on file. 2 weeks status post primary left total knee replacement.  Had a slight elevated temperature the day of discharge which resolved quickly current temperatures particular the end of the day.  He is not short of breath or experiencing chest pain.  Not having any calf discomfort.  Is not have any  trouble with urination.  He is doing well in physical therapy with minimal knee pain.  HPI  Review of Systems   Objective: Vital Signs: There were no vitals taken for this visit.  Physical Exam  Ortho Exam awake alert and oriented x3.  Comfortable sitting.  Does have a positive hemarthrosis left knee.  I did have evacuated 40 cc of bloody fluid and will send it to the lab but had much better motion at that point you had full extension and flexes over 90 degrees.  No instability.  No calf pain.  No popliteal pain.  No distal edema.  Neurologically intact.  Straight leg raise negative.  Chest clear to auscultation Specialty Comments:  No specialty comments available.  Imaging: Xr Knee 3 View Left  Result Date: 02/08/33 Duplicate order    PMFS History: Patient Active Problem List   Diagnosis Date Noted  . Primary osteoarthritis of left knee 07/25/2018  . History of left knee replacement 07/25/2018  . Right foot pain 06/22/2018  . Acute left-sided low back pain with left-sided sciatica 06/22/2018  . Osteoarthritis 11/21/2017  . Hyperlipidemia 11/21/2017  . Low testosterone 09/26/2016  . Healthcare maintenance 09/26/2016  . S/P total knee replacement using cement, right 06/01/2016  . Sleep apnea 05/19/2016  . Hypertension 05/19/2016   Past Medical History:  Diagnosis Date  .  Arthritis    back & L knee, R foot - lis franc   . Deep vein thrombosis ([place])    after knee surgery in 2004  . GERD (gastroesophageal reflux disease)    only with pizza   . Hypertension   . Murmur, cardiac    pt. reports its difficult to auscultate   . Sleep apnea    CPAP- last study- 2014    Family History  Problem Relation Age of Onset  . Alzheimer's disease Father   . Colon cancer Neg Hx   . Esophageal cancer Neg Hx   . Rectal cancer Neg Hx   . Stomach cancer Neg Hx     Past Surgical History:  Procedure Laterality Date  . CERVICAL SPINE SURGERY N/A 1990   C5-6 discectomy  . FINGER  SURGERY Right 2008   x 3 index finger  . KNEE ARTHROPLASTY    . KNEE ARTHROSCOPY Bilateral    L 2004, R 2005  . MASS EXCISION Right 01/18/2017   Procedure: EXCISION BENIGN RIGHT NECK MASS;  Surgeon: Rozetta Nunnery, MD;  Location: [facility];  Service: ENT;  Laterality: Right;  . NASAL FRACTURE SURGERY    . 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 TOTAL KNEE ARTHROPLASTY Right 06/01/2016   Procedure: TOTAL KNEE ARTHROPLASTY;  Surgeon: [name], MD;  Location: [facility];  Service: Orthopedics;  Laterality: Right;  . TOTAL KNEE ARTHROPLASTY Left 07/25/2018   Procedure: LEFT TOTAL KNEE ARTHROPLASTY;  Surgeon: [name], MD;  Location: [facility];  Service: Orthopedics;  Laterality: Left;   Social History   Occupational History  . Not on file  Tobacco Use  . Smoking status: Never Smoker  . Smokeless tobacco: Never Used  Substance and Sexual Activity  . Alcohol use: Yes    Comment: rarely  . Drug use: No  . Sexual activity: Not on file     [name], MD   Note - This record has been created using [employer].  Chart creation errors have been sought, but may not always  have been located. Such creation errors do not reflect on  the standard of medical care.

## 2018-08-11 ENCOUNTER — Telehealth (INDEPENDENT_AMBULATORY_CARE_PROVIDER_SITE_OTHER): Payer: Self-pay | Admitting: Orthopaedic Surgery

## 2018-08-11 ENCOUNTER — Encounter (INDEPENDENT_AMBULATORY_CARE_PROVIDER_SITE_OTHER): Payer: Self-pay | Admitting: Orthopaedic Surgery

## 2018-08-11 NOTE — Telephone Encounter (Signed)
Patient called stating he just received a subpoena to appear in court as a witness in a criminal case on Monday 08/14/18.  Patient is requesting a note from Dr. Durward Fortes to excuse him from appearing since he just had surgery.  Please advise.

## 2018-08-11 NOTE — Telephone Encounter (Signed)
Spoke with patient. I have left a note up front for him to pick up at his convenience.

## 2018-08-14 ENCOUNTER — Telehealth (INDEPENDENT_AMBULATORY_CARE_PROVIDER_SITE_OTHER): Payer: Self-pay | Admitting: Orthopaedic Surgery

## 2018-08-14 ENCOUNTER — Other Ambulatory Visit (INDEPENDENT_AMBULATORY_CARE_PROVIDER_SITE_OTHER): Payer: Self-pay | Admitting: Orthopaedic Surgery

## 2018-08-14 ENCOUNTER — Ambulatory Visit: Payer: 59 | Admitting: Physical Therapy

## 2018-08-14 LAB — ANAEROBIC AND AEROBIC CULTURE
AER RESULT:: NO GROWTH
MICRO NUMBER:: 241500
MICRO NUMBER:: 241693
SPECIMEN QUALITY:: ADEQUATE
SPECIMEN QUALITY:: ADEQUATE

## 2018-08-14 LAB — SYNOVIAL CELL COUNT + DIFF, W/ CRYSTALS
BASOPHILS, %: 0 %
Eosinophils-Synovial: 0 % (ref 0–2)
Lymphocytes-Synovial Fld: 16 % (ref 0–74)
Monocyte/Macrophage: 1 % (ref 0–69)
Neutrophil, Synovial: 83 % — ABNORMAL HIGH (ref 0–24)
Synoviocytes, %: 0 % (ref 0–15)
WBC, Synovial: 1957 cells/uL — ABNORMAL HIGH (ref <150)

## 2018-08-14 MED ORDER — TRAMADOL HCL 50 MG PO TABS: 50.0000 mg | ORAL_TABLET | Freq: Four times a day (QID) | ORAL | 0 refills | Status: DC | PRN

## 2018-08-14 MED FILL — traMADol HCL 50 MG TABS: 50 | 8 days supply | Qty: 30 | Fill #0

## 2018-08-14 NOTE — Telephone Encounter (Signed)
Left message-called intramadol

## 2018-08-14 NOTE — Telephone Encounter (Signed)
Please advise 

## 2018-08-14 NOTE — Telephone Encounter (Signed)
Patient called stating he had fluid removed from his knee last week and is checking on the results.  Patient is asking if it could be the cause of his low grade fever which ranges from 99.5 to 100.1.    Patient is also requesting a prescription for another pain medicine that isn't as strong as Oxycodone.  Patient states he only needs it after physical therapy and at night.  Patient's pharmacy is [facility].

## 2018-08-17 ENCOUNTER — Other Ambulatory Visit: Payer: Self-pay

## 2018-08-17 ENCOUNTER — Ambulatory Visit: Payer: 59 | Attending: Orthopaedic Surgery | Admitting: Physical Therapy

## 2018-08-17 DIAGNOSIS — R262 Difficulty in walking, not elsewhere classified: Secondary | ICD-10-CM

## 2018-08-17 DIAGNOSIS — M25562 Pain in left knee: Secondary | ICD-10-CM | POA: Diagnosis not present

## 2018-08-17 DIAGNOSIS — M6281 Muscle weakness (generalized): Secondary | ICD-10-CM

## 2018-08-17 DIAGNOSIS — R600 Localized edema: Secondary | ICD-10-CM | POA: Diagnosis not present

## 2018-08-17 NOTE — Therapy (Signed)
[place], [place], [address] Phone: [phone]   Fax:  [phone]  Physical Therapy Evaluation  Patient Details  Name: James Gallegos MRN: [id number] Date of Birth: [birth year] Referring Provider (PT): Dr [name]   Encounter Date: 08/17/2018  PT End of Session - 08/17/18 1055    Visit Number  1    Number of Visits  12    Date for PT Re-Evaluation  09/28/18    Authorization Type  [employer]    PT Start Time  1101    PT Stop Time  1157    PT Time Calculation (min)  56 min    Activity Tolerance  Patient tolerated treatment well       Past Medical History:  Diagnosis Date  . Arthritis    back & L knee, R foot - lis franc   . Deep vein thrombosis ([place])    after knee surgery in 2004  . GERD (gastroesophageal reflux disease)    only with pizza   . Hypertension   . Murmur, cardiac    pt. reports its difficult to auscultate   . Sleep apnea    CPAP- last study- 2014    Past Surgical History:  Procedure Laterality Date  . CERVICAL SPINE SURGERY N/A 1990   C5-6 discectomy  . FINGER SURGERY Right 2008   x 3 index finger  . KNEE ARTHROPLASTY    . KNEE ARTHROSCOPY Bilateral    L 2004, R 2005  . MASS EXCISION Right 01/18/2017   Procedure: EXCISION BENIGN RIGHT NECK MASS;  Surgeon: Rozetta Nunnery, MD;  Location: [facility];  Service: ENT;  Laterality: Right;  . NASAL FRACTURE SURGERY    . 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 TOTAL KNEE ARTHROPLASTY Right 06/01/2016   Procedure: TOTAL KNEE ARTHROPLASTY;  Surgeon: [name], MD;  Location: [facility];  Service: Orthopedics;  Laterality: Right;  . TOTAL KNEE ARTHROPLASTY Left 07/25/2018   Procedure: LEFT TOTAL KNEE ARTHROPLASTY;  Surgeon: [name], MD;  Location: [facility];  Service: Orthopedics;  Laterality: Left;    There were no vitals filed for this visit.   Subjective Assessment - 08/17/18 1101     Subjective  Pt had elective Lt TKA 07/25/2018, he thinks this knee is going better than the Rt. He thinks his muscles are sore from the tourniquet.  He has also been using the CPM machine a lot too.  HHPT stopped coming last Monday.     Patient Stated Goals  less pain in the knee and flex 120 degrees.     Currently in Pain?  Yes    Pain Score  2     Pain Location  Knee    Pain Orientation  Left    Pain Descriptors / Indicators  Aching;Sharp;Sore    Pain Type  Surgical pain    Pain Onset  1 to 4 weeks ago    Pain Frequency  Intermittent    Aggravating Factors   bending and moving the knee 5/10 pain, walking,     Pain Relieving Factors  ice, rest         [facility] PT Assessment - 08/17/18 0001      Assessment   Medical Diagnosis  Lt TKA    Referring Provider (PT)  Dr [name]    Onset Date/Surgical Date  07/25/18    Hand Dominance  Right    Next MD Visit  08/23/2018  Prior Therapy  HHPT      Precautions   Precautions  None    Precaution Comments  still using CPM at home however will be stopping soon      Restrictions   Weight Bearing Restrictions  No      Balance Screen   Has the patient fallen in the past 6 months  Yes    How many times?  1    Has the patient had a decrease in activity level because of a fear of falling?   No    Is the patient reluctant to leave their home because of a fear of falling?   No      Home Environment   Living Environment  Private residence    Home Access  Stairs to enter   3 takjing one at a time now     Prior Function   Level of Independence  Independent    Vocation  Full time employment    Engineer, structural, walking and bending    Leisure  sedentary life style - shooting guns       Observation/Other Assessments   Focus on Therapeutic Outcomes (FOTO)   57% limited      Observation/Other Assessments-Edema    Edema  --   (+) in Lt knee joint     Functional Tests   Functional tests  Single leg stance       Single Leg Stance   Comments  Lt 10 sec , Rt 12 sec      ROM / Strength   AROM / PROM / Strength  AROM;PROM;Strength      AROM   AROM Assessment Site  Knee    Right/Left Knee  Left;Right    Right Knee Extension  -1    Right Knee Flexion  107    Left Knee Extension  -6    Left Knee Flexion  102      PROM   PROM Assessment Site  Knee    Right/Left Knee  Left    Left Knee Extension  -3    Left Knee Flexion  105      Strength   Strength Assessment Site  Hip;Knee;Ankle    Right/Left Hip  Left    Left Hip Flexion  4+/5   (+) quad lag   Left Hip Extension  4+/5    Left Hip ABduction  4+/5    Right/Left Knee  Left   Rt WNl   Left Knee Flexion  4-/5    Left Knee Extension  4/5    Right/Left Ankle  --   WNL     Flexibility   Soft Tissue Assessment /Muscle Length  yes    Hamstrings  bilat to ~ 80 degrees    Quadriceps  tight bilat       Palpation   Patella mobility  --   decreased scar mobility   Palpation comment  tight and tender in lateral Lt quad/distal ITB       Ambulation/Gait   Gait Pattern  Decreased step length - left;Decreased stance time - left;Decreased hip/knee flexion - left;Antalgic                Objective measurements completed on examination: See above findings.      [facility] Adult PT Treatment/Exercise - 08/17/18 0001      Transfers   Transfers  Sit to Stand    Sit to Stand  7: Independent      Ambulation/Gait  Ambulation/Gait  Yes    Ambulation Distance (Feet)  --   observed in clinic   Assistive device  Straight cane      Exercises   Exercises  Knee/Hip      Knee/Hip Exercises: Stretches   Quad Stretch  Left;60 seconds   prone with strap     Knee/Hip Exercises: Standing   SLS  Lt to tolerance      Knee/Hip Exercises: Seated   Heel Slides  AAROM;Left;10 reps      Knee/Hip Exercises: Prone   Other Prone Exercises  10x10sec quad sets      Modalities   Modalities  Vasopneumatic      Vasopneumatic   Number Minutes  Vasopneumatic   15 minutes    Vasopnuematic Location   Knee   lt   Vasopneumatic Pressure  Medium    Vasopneumatic Temperature   3*             PT Education - 08/17/18 1244    Education Details  HEP progression    Person(s) Educated  Patient    Methods  Explanation;Demonstration;Handout    Comprehension  Returned demonstration;Verbalized understanding          PT Long Term Goals - 08/17/18 1255      PT LONG TERM GOAL #1   Title  Patient will increase gross Lt knee flexion movement to 0-120 degrees without pain in order to get up and down off a low chair. ( 09/28/2018)     Time  6    Period  Weeks    Status  New    Target Date  09/28/18      PT LONG TERM GOAL #2   Title  Patient will ambulate 3000' without increased pain and without an antalgic gait in order to return to work  on even and uneven surfaces ( 09/28/2018)     Time  6    Period  Weeks    Status  New    Target Date  09/28/18      PT LONG TERM GOAL #3   Title  Patient will be independent with final HEP for LE strength and stability ( 09/28/2018)     Time  6    Period  Weeks    Status  New    Target Date  09/28/18      PT LONG TERM GOAL #4   Title  Patient will demsotrate a 44% limitation on FOTO  ( 09/28/2018)     Time  6    Period  Weeks    Status  New    Target Date  09/28/18      PT LONG TERM GOAL #5   Title  Patient will go up/down 8 steps without increased pain in order to perfrom ADLs and get in/out of houses safely for work ( 09/28/2018)     Time  6    Period  Weeks    Status  New    Target Date  09/28/18             Plan - 08/17/18 1250    Clinical Impression Statement  68 yo male 3 wks s/p Lt TKA.  He had HHPT until last week and is referred to OPPT to continue with rehab.  He has limited knee ROM, gen weakness, gait abnormality, edema and pain. He would benfefit from PT to address the defecits and return him to his PLOF.      Personal Factors and Comorbidities  Comorbidity 3+     Comorbidities  lumbar arthritis, HTN, 2 cervical surgeries, h/o Lt DVT, Rt TKA    Examination-Activity Limitations  Bathing;Stairs;Sleep    Examination-Participation Restrictions  Community Activity    Stability/Clinical Decision Making  Stable/Uncomplicated    Clinical Decision Making  Low    Rehab Potential  Excellent    PT Frequency  2x / week    PT Duration  6 weeks    PT Treatment/Interventions  Iontophoresis 4mg /ml Dexamethasone;Balance training;Neuromuscular re-education;Scar mobilization;Passive range of motion;Dry needling;Manual techniques;Functional mobility training;Ultrasound;Moist Heat;Joint Manipulations;Patient/family education;Cryotherapy;Electrical Stimulation;Therapeutic exercise;Vasopneumatic Device;Taping    PT Next Visit Plan  increase Lt knee ROM, LE strength, gait and modalities PRN    Consulted and Agree with Plan of Care  Patient       Patient will benefit from skilled therapeutic intervention in order to improve the following deficits and impairments:  Difficulty walking, Pain, Decreased scar mobility, Decreased mobility, Decreased range of motion, Decreased strength, Increased edema  Visit Diagnosis: Muscle weakness (generalized) - Plan: PT plan of care cert/re-cert  Acute pain of left knee - Plan: PT plan of care cert/re-cert  Difficulty in walking, not elsewhere classified - Plan: PT plan of care cert/re-cert  Localized edema - Plan: PT plan of care cert/re-cert     Problem List Patient Active Problem List   Diagnosis Date Noted  . Primary osteoarthritis of left knee 07/25/2018  . History of left knee replacement 07/25/2018  . Right foot pain 06/22/2018  . Acute left-sided low back pain with left-sided sciatica 06/22/2018  . Osteoarthritis 11/21/2017  . Hyperlipidemia 11/21/2017  . Low testosterone 09/26/2016  . Healthcare maintenance 09/26/2016  . S/P total knee replacement using cement, right 06/01/2016  . Sleep apnea 05/19/2016  . Hypertension  05/19/2016    Boneta Lucks rPT  08/17/2018, 1:00 PM  [facility] [address] [place], [place], [address] Phone: [phone]   Fax:  [phone]  Name: James Gallegos MRN: [id number] Date of Birth: 04-07-1951

## 2018-08-21 ENCOUNTER — Ambulatory Visit: Payer: 59 | Admitting: Physical Therapy

## 2018-08-21 ENCOUNTER — Encounter: Payer: Self-pay | Admitting: Physical Therapy

## 2018-08-21 DIAGNOSIS — M25562 Pain in left knee: Secondary | ICD-10-CM

## 2018-08-21 DIAGNOSIS — M6281 Muscle weakness (generalized): Secondary | ICD-10-CM | POA: Diagnosis not present

## 2018-08-21 DIAGNOSIS — R600 Localized edema: Secondary | ICD-10-CM | POA: Diagnosis not present

## 2018-08-21 DIAGNOSIS — R262 Difficulty in walking, not elsewhere classified: Secondary | ICD-10-CM | POA: Diagnosis not present

## 2018-08-21 NOTE — Therapy (Signed)
[place], [place], [address] Phone: [phone]   Fax:  [phone]  Physical Therapy Treatment  Patient Details  Name: James Gallegos MRN: [id number] Date of Birth: Jan 08, 1951 Referring Provider (PT): Dr [name]   Encounter Date: 08/21/2018  PT End of Session - 08/21/18 0935    Visit Number  2    Number of Visits  12    Date for PT Re-Evaluation  09/28/18    Authorization Type  [employer]    PT Start Time  0935    PT Stop Time  1030    PT Time Calculation (min)  55 min    Activity Tolerance  Patient tolerated treatment well    Behavior During Therapy  [facility] for tasks assessed/performed       Past Medical History:  Diagnosis Date  . Arthritis    back & L knee, R foot - lis franc   . Deep vein thrombosis ([place])    after knee surgery in 2004  . GERD (gastroesophageal reflux disease)    only with pizza   . Hypertension   . Murmur, cardiac    pt. reports its difficult to auscultate   . Sleep apnea    CPAP- last study- 2014    Past Surgical History:  Procedure Laterality Date  . CERVICAL SPINE SURGERY N/A 1990   C5-6 discectomy  . FINGER SURGERY Right 2008   x 3 index finger  . KNEE ARTHROPLASTY    . KNEE ARTHROSCOPY Bilateral    L 2004, R 2005  . MASS EXCISION Right 01/18/2017   Procedure: EXCISION BENIGN RIGHT NECK MASS;  Surgeon: Rozetta Nunnery, MD;  Location: [facility];  Service: ENT;  Laterality: Right;  . NASAL FRACTURE SURGERY    . 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 TOTAL KNEE ARTHROPLASTY Right 06/01/2016   Procedure: TOTAL KNEE ARTHROPLASTY;  Surgeon: [name], MD;  Location: [facility];  Service: Orthopedics;  Laterality: Right;  . TOTAL KNEE ARTHROPLASTY Left 07/25/2018   Procedure: LEFT TOTAL KNEE ARTHROPLASTY;  Surgeon: [name], MD;  Location: [facility];  Service: Orthopedics;  Laterality: Left;    There were no vitals filed for  this visit.  Subjective Assessment - 08/21/18 0935    Subjective  Been working the bend hard and the Lt quad is very tight now, having a lot of tenderness to touch even sheets - this is improving    Patient Stated Goals  less pain in the knee and flex 120 degrees.     Currently in Pain?  Yes    Pain Score  4     Pain Location  Leg    Pain Orientation  Left   quad   Pain Descriptors / Indicators  Tightness;Burning    Pain Type  Surgical pain    Pain Onset  More than a month ago    Pain Frequency  Intermittent    Aggravating Factors   walking and bending the knee    Pain Relieving Factors  rest and ice and massaging leg         [facility] PT Assessment - 08/21/18 0001      Assessment   Medical Diagnosis  Lt TKA      AROM   Left Knee Flexion  95   after mobs 98                  [facility] Adult PT Treatment/Exercise -  08/21/18 0001      Ambulation/Gait   Gait Comments  reviewed heel toe gait with knee flexion at toe off and keeping foot straight       Knee/Hip Exercises: Aerobic   Nustep  for ROM and stretching x l5'      Knee/Hip Exercises: Seated   [employer] reps   with bend into flexion     Knee/Hip Exercises: Supine   Bridges  Strengthening;Both;3 sets;10 reps      Modalities   Modalities  Moist Heat;Vasopneumatic      Moist Heat Therapy   Number Minutes Moist Heat  15 Minutes    Moist Heat Location  --   Lt quad     Vasopneumatic   Number Minutes Vasopneumatic   15 minutes    Vasopnuematic Location   Knee   Lt   Vasopneumatic Pressure  High    Vasopneumatic Temperature   3*      Manual Therapy   Manual Therapy  Myofascial release;Soft tissue mobilization;Joint mobilization    Joint Mobilization  Lt knee flexion mobs 5x30 sec big with PROM inbetween    Soft tissue mobilization  IASTM to Lt quad and ITB to loosen up tightness and reduce pain    Myofascial Release  to Lt ITB                  PT Long Term Goals -  08/17/18 1255      PT LONG TERM GOAL #1   Title  Patient will increase gross Lt knee flexion movement to 0-120 degrees without pain in order to get up and down off a low chair. ( 09/28/2018)     Time  6    Period  Weeks    Status  New    Target Date  09/28/18      PT LONG TERM GOAL #2   Title  Patient will ambulate 3000' without increased pain and without an antalgic gait in order to return to work  on even and uneven surfaces ( 09/28/2018)     Time  6    Period  Weeks    Status  New    Target Date  09/28/18      PT LONG TERM GOAL #3   Title  Patient will be independent with final HEP for LE strength and stability ( 09/28/2018)     Time  6    Period  Weeks    Status  New    Target Date  09/28/18      PT LONG TERM GOAL #4   Title  Patient will demsotrate a 44% limitation on FOTO  ( 09/28/2018)     Time  6    Period  Weeks    Status  New    Target Date  09/28/18      PT LONG TERM GOAL #5   Title  Patient will go up/down 8 steps without increased pain in order to perfrom ADLs and get in/out of houses safely for work ( 09/28/2018)     Time  6    Period  Weeks    Status  New    Target Date  09/28/18            Plan - 08/21/18 1351    Clinical Impression Statement  James Gallegos presented with alot of Lt quad and ITB tightness today causing increase in pain.  He responded well to manual work to improve tissue pliability.  His  flexion measured less today than last visit however he did a lot of walking/activity over the weekend and likely has some increase in edema. Would benefit from [place] to imcrease motion as well as progress strength and mobility     Rehab Potential  Excellent    PT Frequency  2x / week    PT Duration  6 weeks    PT Treatment/Interventions  Iontophoresis 4mg /ml Dexamethasone;Balance training;Neuromuscular re-education;Scar mobilization;Passive range of motion;Dry needling;Manual techniques;Functional mobility training;Ultrasound;Moist Heat;Joint  Manipulations;Patient/family education;Cryotherapy;Electrical Stimulation;Therapeutic exercise;Vasopneumatic Device;Taping    PT Next Visit Plan  increase Lt knee ROM, LE strength, gait and modalities PRN    Consulted and Agree with Plan of Care  Patient       Patient will benefit from skilled therapeutic intervention in order to improve the following deficits and impairments:  Difficulty walking, Pain, Decreased scar mobility, Decreased mobility, Decreased range of motion, Decreased strength, Increased edema  Visit Diagnosis: Muscle weakness (generalized)  Acute pain of left knee  Difficulty in walking, not elsewhere classified     Problem List Patient Active Problem List   Diagnosis Date Noted  . Primary osteoarthritis of left knee 07/25/2018  . History of left knee replacement 07/25/2018  . Right foot pain 06/22/2018  . Acute left-sided low back pain with left-sided sciatica 06/22/2018  . Osteoarthritis 11/21/2017  . Hyperlipidemia 11/21/2017  . Low testosterone 09/26/2016  . Healthcare maintenance 09/26/2016  . S/P total knee replacement using cement, right 06/01/2016  . Sleep apnea 05/19/2016  . Hypertension 05/19/2016    James Gallegos PT  08/21/2018, 1:54 PM  [facility] [address] [place], [place], [address] Phone: [phone]   Fax:  [phone]  Name: James Gallegos MRN: [id number] Date of Birth: 14-May-1951

## 2018-08-22 ENCOUNTER — Ambulatory Visit (INDEPENDENT_AMBULATORY_CARE_PROVIDER_SITE_OTHER): Payer: 59 | Admitting: Orthopedic Surgery

## 2018-08-22 ENCOUNTER — Encounter (INDEPENDENT_AMBULATORY_CARE_PROVIDER_SITE_OTHER): Payer: Self-pay | Admitting: Orthopedic Surgery

## 2018-08-22 VITALS — BP 151/66 | HR 76 | Ht 69.0 in | Wt 213.0 lb

## 2018-08-22 DIAGNOSIS — Z96652 Presence of left artificial knee joint: Secondary | ICD-10-CM

## 2018-08-22 MED ORDER — TRAMADOL HCL 50 MG PO TABS: 50.0000 mg | ORAL_TABLET | Freq: Four times a day (QID) | ORAL | 0 refills | Status: DC | PRN

## 2018-08-22 MED ORDER — METHOCARBAMOL 500 MG PO TABS: 500.0000 mg | ORAL_TABLET | Freq: Four times a day (QID) | ORAL | 0 refills | Status: DC

## 2018-08-22 MED FILL — METHOCARBAMOL 500 MG TABS: 500 | 8 days supply | Qty: 30 | Fill #0

## 2018-08-22 MED FILL — traMADol HCL 50 MG TABS: 50 | 8 days supply | Qty: 30 | Fill #0

## 2018-08-22 NOTE — Progress Notes (Signed)
Office Visit Note   Patient: James Gallegos           Date of Birth: August 17, 1950           MRN: [id number] Visit Date: 08/22/2018              Requested by: Aldine Contes, MD [address], [place], [place] [address] PCP: Aldine Contes, MD   Assessment & Plan: Visit Diagnoses:  1. S/P TKR (total knee replacement) using cement, left     Plan:  #1: Continue physical therapy as per protocol #2: Moisturizing cream to the incision  Follow-Up Instructions: Return in about 3 weeks (around 09/12/2018).   Orders:  No orders of the defined types were placed in this encounter.  Meds ordered this encounter  Medications  . traMADol (ULTRAM) 50 MG tablet    Sig: Take 1 tablet (50 mg total) by mouth every 6 (six) hours as needed.    Dispense:  30 tablet    Refill:  0    Order Specific Question:   Supervising Provider    Answer:   [name] [[id number]]  . methocarbamol (ROBAXIN) 500 MG tablet    Sig: Take 1 tablet (500 mg total) by mouth 4 (four) times daily.    Dispense:  30 tablet    Refill:  0    Order Specific Question:   Supervising Provider    Answer:   [name] [[id number]]      Procedures: No procedures performed   Clinical Data: No additional findings.   Subjective: Chief Complaint  Patient presents with  . Left Knee - Routine Post Op    Left TKA DOS 07/25/18  Patient presents today for a two week follow up on his left knee. He had a left knee total arthroplasty on 07/25/18. He is taking tramadol for therapy and at night. He is going to physical therapy twice weekly.   HPI  James Gallegos seen today for follow-up of his left total knee arthroplasty now 4 weeks postop.  Doing very well.  He is just using tramadol at nighttime after therapy.  Doing physical therapy twice a week.  He states he had motion about 105 degrees recently.  He did have some increase in pain and has lost maybe 5 to 10 degrees of motion.  States he is having no temperatures.   Interestingly he states that once he got off of the oxycodone his low-grade temp stopped.  He is having no problems with the tramadol.  Seen today for follow-up.  Review of Systems   Objective: Vital Signs: BP (!) 151/66   Pulse 76   Ht 5\' 9"  (1.753 m)   Wt 213 lb (96.6 kg)   BMI 31.45 kg/m   Physical Exam Constitutional:      Appearance: He is well-developed.  Eyes:     Pupils: Pupils are equal, round, and reactive to light.  Pulmonary:     Effort: Pulmonary effort is normal.  Skin:    General: Skin is warm and dry.  Neurological:     Mental Status: He is alert and oriented to person, place, and time.  Psychiatric:        Behavior: Behavior normal.     Ortho Exam  Wound is healing per primam any signs of infection.  He lacks a few degrees of full extension and flexes to about 90 to 95 degrees.  Mild effusion.  No real warmth.  No erythema.  Good patellofemoral  motion.  Ligamentously stable.  Specialty Comments:  No specialty comments available.  Imaging: No results found.   PMFS History: Patient Active Problem List   Diagnosis Date Noted  . Primary osteoarthritis of left knee 07/25/2018  . History of left knee replacement 07/25/2018  . Right foot pain 06/22/2018  . Acute left-sided low back pain with left-sided sciatica 06/22/2018  . Osteoarthritis 11/21/2017  . Hyperlipidemia 11/21/2017  . Low testosterone 09/26/2016  . Healthcare maintenance 09/26/2016  . S/P total knee replacement using cement, right 06/01/2016  . Sleep apnea 05/19/2016  . Hypertension 05/19/2016   Past Medical History:  Diagnosis Date  . Arthritis    back & L knee, R foot - lis franc   . Deep vein thrombosis ([place])    after knee surgery in 2004  . GERD (gastroesophageal reflux disease)    only with pizza   . Hypertension   . Murmur, cardiac    pt. reports its difficult to auscultate   . Sleep apnea    CPAP- last study- 2014    Family History  Problem Relation Age of Onset  .  Alzheimer's disease Father   . Colon cancer Neg Hx   . Esophageal cancer Neg Hx   . Rectal cancer Neg Hx   . Stomach cancer Neg Hx     Past Surgical History:  Procedure Laterality Date  . CERVICAL SPINE SURGERY N/A 1990   C5-6 discectomy  . FINGER SURGERY Right 2008   x 3 index finger  . KNEE ARTHROPLASTY    . KNEE ARTHROSCOPY Bilateral    L 2004, R 2005  . MASS EXCISION Right 01/18/2017   Procedure: EXCISION BENIGN RIGHT NECK MASS;  Surgeon: Rozetta Nunnery, MD;  Location: [facility];  Service: ENT;  Laterality: Right;  . NASAL FRACTURE SURGERY    . 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 TOTAL KNEE ARTHROPLASTY Right 06/01/2016   Procedure: TOTAL KNEE ARTHROPLASTY;  Surgeon: [name], MD;  Location: [facility];  Service: Orthopedics;  Laterality: Right;  . TOTAL KNEE ARTHROPLASTY Left 07/25/2018   Procedure: LEFT TOTAL KNEE ARTHROPLASTY;  Surgeon: [name], MD;  Location: [facility];  Service: Orthopedics;  Laterality: Left;   Social History   Occupational History  . Not on file  Tobacco Use  . Smoking status: Never Smoker  . Smokeless tobacco: Never Used  Substance and Sexual Activity  . Alcohol use: Yes    Comment: rarely  . Drug use: No  . Sexual activity: Not on file

## 2018-08-23 ENCOUNTER — Encounter: Payer: Self-pay | Admitting: Physical Therapy

## 2018-08-23 ENCOUNTER — Other Ambulatory Visit: Payer: Self-pay

## 2018-08-23 ENCOUNTER — Ambulatory Visit: Payer: 59 | Admitting: Physical Therapy

## 2018-08-23 DIAGNOSIS — R262 Difficulty in walking, not elsewhere classified: Secondary | ICD-10-CM | POA: Diagnosis not present

## 2018-08-23 DIAGNOSIS — M6281 Muscle weakness (generalized): Secondary | ICD-10-CM

## 2018-08-23 DIAGNOSIS — M25562 Pain in left knee: Secondary | ICD-10-CM | POA: Diagnosis not present

## 2018-08-23 DIAGNOSIS — R600 Localized edema: Secondary | ICD-10-CM

## 2018-08-23 NOTE — Therapy (Signed)
[place], [place], [address] Phone: [phone]   Fax:  [phone]  Physical Therapy Treatment  Patient Details  Name: [name] MRN: [id number] Date of Birth: [birth year] Referring Provider (PT): Dr [name]   Encounter Date: 08/23/2018  PT End of Session - 08/23/18 1503    Visit Number  3    Number of Visits  12    Date for PT Re-Evaluation  09/28/18    Authorization Type  [employer]    PT Start Time  1503    PT Stop Time  1555    PT Time Calculation (min)  52 min    Activity Tolerance  Patient tolerated treatment well    Behavior During Therapy  [facility] for tasks assessed/performed       Past Medical History:  Diagnosis Date  . Arthritis    back & L knee, R foot - lis franc   . Deep vein thrombosis ([place])    after knee surgery in 2004  . GERD (gastroesophageal reflux disease)    only with pizza   . Hypertension   . Murmur, cardiac    pt. reports its difficult to auscultate   . Sleep apnea    CPAP- last study- 2014    Past Surgical History:  Procedure Laterality Date  . CERVICAL SPINE SURGERY N/A 1990   C5-6 discectomy  . FINGER SURGERY Right 2008   x 3 index finger  . KNEE ARTHROPLASTY    . KNEE ARTHROSCOPY Bilateral    L 2004, R 2005  . MASS EXCISION Right 01/18/2017   Procedure: EXCISION BENIGN RIGHT NECK MASS;  Surgeon: Rozetta Nunnery, MD;  Location: [facility];  Service: ENT;  Laterality: Right;  . NASAL FRACTURE SURGERY    . 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 TOTAL KNEE ARTHROPLASTY Right 06/01/2016   Procedure: TOTAL KNEE ARTHROPLASTY;  Surgeon: [name], MD;  Location: [facility];  Service: Orthopedics;  Laterality: Right;  . TOTAL KNEE ARTHROPLASTY Left 07/25/2018   Procedure: LEFT TOTAL KNEE ARTHROPLASTY;  Surgeon: [name], MD;  Location: [facility];  Service: Orthopedics;  Laterality: Left;    There were no vitals filed for  this visit.  Subjective Assessment - 08/23/18 1505    Subjective  " I am still feelng tight and stiff, I did see the Md and he was pleased with my function"     Currently in Pain?  Yes    Pain Score  2     Pain Orientation  Left    Pain Type  Surgical pain    Pain Onset  More than a month ago    Pain Frequency  Intermittent         [facility] PT Assessment - 08/23/18 0001      Assessment   Medical Diagnosis  Lt TKA    Referring Provider (PT)  Dr [name]      AROM   Left Knee Flexion  102                   [facility] Adult PT Treatment/Exercise - 08/23/18 1509      Knee/Hip Exercises: Stretches   Active Hamstring Stretch  2 reps;30 seconds    Quad Stretch  3 reps;30 seconds;Left   pnf contract/ relax with 10 sec hold     Knee/Hip Exercises: Aerobic   Recumbent Bike  full revolutions x 5 min  Knee/Hip Exercises: Seated   Long Arc Quad  Left;AROM;15 reps   working to flexion/ extension   Hamstring Curl  2 sets;10 reps;Strengthening;Left   with green theraband     Vasopneumatic   Number Minutes Vasopneumatic   10 minutes    Vasopnuematic Location   Knee    Vasopneumatic Pressure  High    Vasopneumatic Temperature   34 degrees      Manual Therapy   Manual Therapy  Passive ROM    Manual therapy comments  MTPR along the recturs femoris    Soft tissue mobilization  DTM along the quad    Passive ROM  L knee flexion combined with R knee active extension for reciprocal inhibtion              PT Education - 08/23/18 1616    Education Details  proper gait mechancis and if he catches that he is limping to stop and work on Higher education careers adviser then resume gait.           PT Long Term Goals - 08/17/18 1255      PT LONG TERM GOAL #1   Title  Patient will increase gross Lt knee flexion movement to 0-120 degrees without pain in order to get up and down off a low chair. ( 09/28/2018)     Time  6    Period  Weeks    Status  New    Target Date   09/28/18      PT LONG TERM GOAL #2   Title  Patient will ambulate 3000' without increased pain and without an antalgic gait in order to return to work  on even and uneven surfaces ( 09/28/2018)     Time  6    Period  Weeks    Status  New    Target Date  09/28/18      PT LONG TERM GOAL #3   Title  Patient will be independent with final HEP for LE strength and stability ( 09/28/2018)     Time  6    Period  Weeks    Status  New    Target Date  09/28/18      PT LONG TERM GOAL #4   Title  Patient will demsotrate a 44% limitation on FOTO  ( 09/28/2018)     Time  6    Period  Weeks    Status  New    Target Date  09/28/18      PT LONG TERM GOAL #5   Title  Patient will go up/down 8 steps without increased pain in order to perfrom ADLs and get in/out of houses safely for work ( 09/28/2018)     Time  6    Period  Weeks    Status  New    Target Date  09/28/18            Plan - 08/23/18 1613    Clinical Impression Statement  pt reports continued soreness in the L quad with increased soreness noted distally. Manual trigger point release and DTM using roller pt noted improvement of soreness. continued mobs which he measures to 102 following. reviewed gait mechanics and importance of heel strike / toe off. continued vaso end of session to calm down soreness.     PT Next Visit Plan  increase Lt knee ROM, LE strength, gait and modalities PRN    Consulted and Agree with Plan of Care  Patient  Patient will benefit from skilled therapeutic intervention in order to improve the following deficits and impairments:  Difficulty walking, Pain, Decreased scar mobility, Decreased mobility, Decreased range of motion, Decreased strength, Increased edema  Visit Diagnosis: Muscle weakness (generalized)  Acute pain of left knee  Difficulty in walking, not elsewhere classified  Localized edema     Problem List Patient Active Problem List   Diagnosis Date Noted  . Primary osteoarthritis of  left knee 07/25/2018  . History of left knee replacement 07/25/2018  . Right foot pain 06/22/2018  . Acute left-sided low back pain with left-sided sciatica 06/22/2018  . Osteoarthritis 11/21/2017  . Hyperlipidemia 11/21/2017  . Low testosterone 09/26/2016  . Healthcare maintenance 09/26/2016  . S/P total knee replacement using cement, right 06/01/2016  . Sleep apnea 05/19/2016  . Hypertension 05/19/2016   Starr Lake PT, DPT, LAT, ATC  08/23/18  4:19 PM      [facility] [address] [place], [place], [address] Phone: [phone]   Fax:  [phone]  Name: DEYVI BONANNO MRN: [id number] Date of Birth: 1951/05/14

## 2018-08-29 ENCOUNTER — Ambulatory Visit: Payer: 59 | Admitting: Physical Therapy

## 2018-08-29 ENCOUNTER — Encounter: Payer: Self-pay | Admitting: Physical Therapy

## 2018-08-29 ENCOUNTER — Other Ambulatory Visit: Payer: Self-pay

## 2018-08-29 DIAGNOSIS — R262 Difficulty in walking, not elsewhere classified: Secondary | ICD-10-CM

## 2018-08-29 DIAGNOSIS — M6281 Muscle weakness (generalized): Secondary | ICD-10-CM

## 2018-08-29 DIAGNOSIS — M25562 Pain in left knee: Secondary | ICD-10-CM | POA: Diagnosis not present

## 2018-08-29 DIAGNOSIS — R600 Localized edema: Secondary | ICD-10-CM

## 2018-08-29 NOTE — Therapy (Addendum)
[place], [place], [address] Phone: [phone]   Fax:  [phone]  Physical Therapy Treatment  Patient Details  Name: James Gallegos MRN: [id number] Date of Birth: 30-Sep-1950 Referring Provider (PT): Dr [name]   Encounter Date: 08/29/2018  PT End of Session - 08/29/18 0929    Visit Number  4    Number of Visits  12    Date for PT Re-Evaluation  09/28/18    Authorization Type  [employer]    PT Start Time  0930    PT Stop Time  1018    PT Time Calculation (min)  48 min    Activity Tolerance  Patient tolerated treatment well    Behavior During Therapy  [facility] for tasks assessed/performed       Past Medical History:  Diagnosis Date  . Arthritis    back & L knee, R foot - lis franc   . Deep vein thrombosis ([place])    after knee surgery in 2004  . GERD (gastroesophageal reflux disease)    only with pizza   . Hypertension   . Murmur, cardiac    pt. reports its difficult to auscultate   . Sleep apnea    CPAP- last study- 2014    Past Surgical History:  Procedure Laterality Date  . CERVICAL SPINE SURGERY N/A 1990   C5-6 discectomy  . FINGER SURGERY Right 2008   x 3 index finger  . KNEE ARTHROPLASTY    . KNEE ARTHROSCOPY Bilateral    L 2004, R 2005  . MASS EXCISION Right 01/18/2017   Procedure: EXCISION BENIGN RIGHT NECK MASS;  Surgeon: Rozetta Nunnery, MD;  Location: [facility];  Service: ENT;  Laterality: Right;  . NASAL FRACTURE SURGERY    . 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 TOTAL KNEE ARTHROPLASTY Right 06/01/2016   Procedure: TOTAL KNEE ARTHROPLASTY;  Surgeon: [name], MD;  Location: [facility];  Service: Orthopedics;  Laterality: Right;  . TOTAL KNEE ARTHROPLASTY Left 07/25/2018   Procedure: LEFT TOTAL KNEE ARTHROPLASTY;  Surgeon: [name], MD;  Location: [facility];  Service: Orthopedics;  Laterality: Left;    There were no vitals filed for  this visit.  Subjective Assessment - 08/29/18 0929    Subjective  "I am feeling pretty good today, I woke up feeling pretty loose"    Currently in Pain?  Yes    Pain Score  1     Pain Location  Leg    Pain Orientation  Left    Pain Descriptors / Indicators  Aching    Pain Type  Surgical pain    Pain Onset  More than a month ago    Pain Frequency  Intermittent    Aggravating Factors   prolonged standing    Pain Relieving Factors  resting, ice and massage         [facility] PT Assessment - 08/29/18 0001      Assessment   Medical Diagnosis  Lt TKA    Referring Provider (PT)  Dr [name]                   [facility] Adult PT Treatment/Exercise - 08/29/18 0934      Knee/Hip Exercises: Stretches   Active Hamstring Stretch  3 reps;30 seconds;Left   PNF contract/ relax with 10 sec hold   Quad Stretch  3 reps;30 seconds;Left   PNF contract/ relax with 10 sec  hold     Knee/Hip Exercises: Aerobic   Recumbent Bike  L1 x 5 min    full revolutions     Knee/Hip Exercises: Standing   Step Down  2 sets;Left;Step Height: 4";10 reps    Wall Squat  2 sets;10 reps    Wall Squat Limitations  intermittent cues to avoid compensation weight shift    Gait Training  4 x 30 ft walking focusing on heel strike/ toe off with intermittent cues for reciprocal arm swing.       Vasopneumatic   Number Minutes Vasopneumatic   10 minutes    Vasopnuematic Location   Knee    Vasopneumatic Pressure  High    Vasopneumatic Temperature   34 degrees      Manual Therapy   Manual therapy comments  desensitization along the incision site    Joint Mobilization  seated PA and AP glides grade III. seated PA with foot ER to promote screw home mechanism          Balance Exercises - 08/29/18 1012      Balance Exercises: Standing   Standing Eyes Opened  Narrow base of support (BOS);2 reps;30 secs    Standing Eyes Closed  Narrow base of support (BOS);2 reps;30 secs        PT Education - 08/29/18  1008    Education Details  updated HEP for balance training.     Person(s) Educated  Patient    Methods  Explanation;Demonstration;Handout    Comprehension  Verbalized understanding;Returned demonstration          PT Long Term Goals - 08/17/18 1255      PT LONG TERM GOAL #1   Title  Patient will increase gross Lt knee flexion movement to 0-120 degrees without pain in order to get up and down off a low chair. ( 09/28/2018)     Time  6    Period  Weeks    Status  New    Target Date  09/28/18      PT LONG TERM GOAL #2   Title  Patient will ambulate 3000' without increased pain and without an antalgic gait in order to return to work  on even and uneven surfaces ( 09/28/2018)     Time  6    Period  Weeks    Status  New    Target Date  09/28/18      PT LONG TERM GOAL #3   Title  Patient will be independent with final HEP for LE strength and stability ( 09/28/2018)     Time  6    Period  Weeks    Status  New    Target Date  09/28/18      PT LONG TERM GOAL #4   Title  Patient will demsotrate a 44% limitation on FOTO  ( 09/28/2018)     Time  6    Period  Weeks    Status  New    Target Date  09/28/18      PT LONG TERM GOAL #5   Title  Patient will go up/down 8 steps without increased pain in order to perfrom ADLs and get in/out of houses safely for work ( 09/28/2018)     Time  6    Period  Weeks    Status  New    Target Date  09/28/18            Plan - 08/29/18 1014    Clinical Impression Statement  James Gallegos continues to progress appropriately with PT. continued working on knee ROM and stretching progressing to functional gait without AD and strengthening which he did exhibit increased weakness and fatigued quickly with step down. Vaso end of session to calm down soreness.     PT Treatment/Interventions  Iontophoresis 4mg /ml Dexamethasone;Balance training;Neuromuscular re-education;Scar mobilization;Passive range of motion;Dry needling;Manual techniques;Functional  mobility training;Ultrasound;Moist Heat;Joint Manipulations;Patient/family education;Cryotherapy;Electrical Stimulation;Therapeutic exercise;Vasopneumatic Device;Taping    PT Next Visit Plan  increase Lt knee ROM, LE strength, gait and modalities PRN, progress strengthening PRN    PT Home Exercise Plan  corner balance    Consulted and Agree with Plan of Care  Patient       Patient will benefit from skilled therapeutic intervention in order to improve the following deficits and impairments:  Difficulty walking, Pain, Decreased scar mobility, Decreased mobility, Decreased range of motion, Decreased strength, Increased edema  Visit Diagnosis: Muscle weakness (generalized)  Acute pain of left knee  Difficulty in walking, not elsewhere classified  Localized edema     Problem List Patient Active Problem List   Diagnosis Date Noted  . Primary osteoarthritis of left knee 07/25/2018  . History of left knee replacement 07/25/2018  . Right foot pain 06/22/2018  . Acute left-sided low back pain with left-sided sciatica 06/22/2018  . Osteoarthritis 11/21/2017  . Hyperlipidemia 11/21/2017  . Low testosterone 09/26/2016  . Healthcare maintenance 09/26/2016  . S/P total knee replacement using cement, right 06/01/2016  . Sleep apnea 05/19/2016  . Hypertension 05/19/2016   Starr Lake PT, DPT, LAT, ATC  08/29/18  11:05 AM      [facility] [address] [place], [place], [address] Phone: [phone]   Fax:  [phone]  Name: James Gallegos MRN: [id number] Date of Birth: [birth year]

## 2018-08-31 ENCOUNTER — Encounter: Payer: Self-pay | Admitting: Physical Therapy

## 2018-08-31 ENCOUNTER — Ambulatory Visit: Payer: 59 | Admitting: Physical Therapy

## 2018-08-31 ENCOUNTER — Other Ambulatory Visit: Payer: Self-pay

## 2018-08-31 DIAGNOSIS — R600 Localized edema: Secondary | ICD-10-CM | POA: Diagnosis not present

## 2018-08-31 DIAGNOSIS — M25562 Pain in left knee: Secondary | ICD-10-CM | POA: Diagnosis not present

## 2018-08-31 DIAGNOSIS — M6281 Muscle weakness (generalized): Secondary | ICD-10-CM | POA: Diagnosis not present

## 2018-08-31 DIAGNOSIS — R262 Difficulty in walking, not elsewhere classified: Secondary | ICD-10-CM | POA: Diagnosis not present

## 2018-08-31 NOTE — Therapy (Signed)
[place], [place], [address] Phone: [phone]   Fax:  [phone]  Physical Therapy Treatment  Patient Details  Name: James Gallegos MRN: [id number] Date of Birth: 05/26/1951 Referring Provider (PT): Dr [name]   Encounter Date: 08/31/2018  PT End of Session - 08/31/18 1144    Visit Number  5    Number of Visits  12    Date for PT Re-Evaluation  09/28/18    Authorization Type  [employer]    PT Start Time  1144    PT Stop Time  1234    PT Time Calculation (min)  50 min    Activity Tolerance  Patient tolerated treatment well    Behavior During Therapy  [facility] for tasks assessed/performed       Past Medical History:  Diagnosis Date  . Arthritis    back & L knee, R foot - lis franc   . Deep vein thrombosis ([place])    after knee surgery in 2004  . GERD (gastroesophageal reflux disease)    only with pizza   . Hypertension   . Murmur, cardiac    pt. reports its difficult to auscultate   . Sleep apnea    CPAP- last study- 2014    Past Surgical History:  Procedure Laterality Date  . CERVICAL SPINE SURGERY N/A 1990   C5-6 discectomy  . FINGER SURGERY Right 2008   x 3 index finger  . KNEE ARTHROPLASTY    . KNEE ARTHROSCOPY Bilateral    L 2004, R 2005  . MASS EXCISION Right 01/18/2017   Procedure: EXCISION BENIGN RIGHT NECK MASS;  Surgeon: Rozetta Nunnery, MD;  Location: [place];  Service: ENT;  Laterality: Right;  . NASAL FRACTURE SURGERY    . 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 TOTAL KNEE ARTHROPLASTY Right 06/01/2016   Procedure: TOTAL KNEE ARTHROPLASTY;  Surgeon: [name], MD;  Location: [place];  Service: Orthopedics;  Laterality: Right;  . TOTAL KNEE ARTHROPLASTY Left 07/25/2018   Procedure: LEFT TOTAL KNEE ARTHROPLASTY;  Surgeon: [name], MD;  Location: [place];  Service: Orthopedics;  Laterality: Left;    There were no vitals filed for  this visit.  Subjective Assessment - 08/31/18 1145    Subjective  Pt reports he really only has pain with the stretching.     Patient Stated Goals  less pain in the knee and flex 120 degrees.     Currently in Pain?  Yes    Pain Score  1     Pain Location  Knee    Pain Orientation  Left    Pain Descriptors / Indicators  Dull    Pain Type  Surgical pain    Pain Onset  More than a month ago    Pain Frequency  Intermittent    Aggravating Factors   worse at the end of the day 5/10 - sensative to touch    Pain Relieving Factors  ice         [facility] PT Assessment - 08/31/18 0001      Assessment   Medical Diagnosis  Lt TKA    Referring Provider (PT)  Dr [name]    Onset Date/Surgical Date  07/25/18    Next MD Visit  08/23/2018      AROM   Left Knee Flexion  105  [facility] Adult PT Treatment/Exercise - 08/31/18 0001      Ambulation/Gait   Gait Comments  pt walking with stiff Lt knee at heel strike today.        Knee/Hip Exercises: Stretches   Active Hamstring Stretch  Left   with 30 knee presses using strap     Knee/Hip Exercises: Standing   SLS  Lt modified lunge with Rt foot tap behnd and hands to mat      Knee/Hip Exercises: Seated   Long Arc Quad  Strengthening;Left;2 sets;10 reps    Long Arc Quad Weight  --   7.5#   Hamstring Curl  Strengthening;Left;3 sets;10 reps    Hamstring Limitations  black t-band    Sit to [employer]  20 reps;without UE support   with light tap on mat     Knee/Hip Exercises: Supine   Bridges  Strengthening;Both;3 sets;15 reps   with green band around knees     Vasopneumatic   Number Minutes Vasopneumatic   10 minutes    Vasopnuematic Location   Knee    Vasopneumatic Pressure  High    Vasopneumatic Temperature   34 degrees                  PT Long Term Goals - 08/17/18 1255      PT LONG TERM GOAL #1   Title  Patient will increase gross Lt knee flexion movement to 0-120 degrees without pain in  order to get up and down off a low chair. ( 09/28/2018)     Time  6    Period  Weeks    Status  New    Target Date  09/28/18      PT LONG TERM GOAL #2   Title  Patient will ambulate 3000' without increased pain and without an antalgic gait in order to return to work  on even and uneven surfaces ( 09/28/2018)     Time  6    Period  Weeks    Status  New    Target Date  09/28/18      PT LONG TERM GOAL #3   Title  Patient will be independent with final HEP for LE strength and stability ( 09/28/2018)     Time  6    Period  Weeks    Status  New    Target Date  09/28/18      PT LONG TERM GOAL #4   Title  Patient will demsotrate a 44% limitation on FOTO  ( 09/28/2018)     Time  6    Period  Weeks    Status  New    Target Date  09/28/18      PT LONG TERM GOAL #5   Title  Patient will go up/down 8 steps without increased pain in order to perfrom ADLs and get in/out of houses safely for work ( 09/28/2018)     Time  6    Period  Weeks    Status  New    Target Date  09/28/18            Plan - 08/31/18 1228    Clinical Impression Statement  Jenna had some increase in Lt knee flexion today, back to where he was a couple visits ago.  He has fair eccentric control in the left quad and would benefit from working on this to provide stability with SLS activiites.     Rehab Potential  Excellent    PT Frequency  2x / week    PT Duration  6 weeks    PT Treatment/Interventions  Iontophoresis 4mg /ml Dexamethasone;Balance training;Neuromuscular re-education;Scar mobilization;Passive range of motion;Dry needling;Manual techniques;Functional mobility training;Ultrasound;Moist Heat;Joint Manipulations;Patient/family education;Cryotherapy;Electrical Stimulation;Therapeutic exercise;Vasopneumatic Device;Taping    PT Next Visit Plan  eccentric quad work, Lt knee ROM    Consulted and Agree with Plan of Care  Patient       Patient will benefit from skilled therapeutic intervention in order to improve  the following deficits and impairments:  Difficulty walking, Pain, Decreased scar mobility, Decreased mobility, Decreased range of motion, Decreased strength, Increased edema  Visit Diagnosis: Muscle weakness (generalized)  Acute pain of left knee  Difficulty in walking, not elsewhere classified  Localized edema     Problem List Patient Active Problem List   Diagnosis Date Noted  . Primary osteoarthritis of left knee 07/25/2018  . History of left knee replacement 07/25/2018  . Right foot pain 06/22/2018  . Acute left-sided low back pain with left-sided sciatica 06/22/2018  . Osteoarthritis 11/21/2017  . Hyperlipidemia 11/21/2017  . Low testosterone 09/26/2016  . Healthcare maintenance 09/26/2016  . S/P total knee replacement using cement, right 06/01/2016  . Sleep apnea 05/19/2016  . Hypertension 05/19/2016    Jeral Pinch PT  08/31/2018, 12:30 PM  [facility] [address] [place], [place], [address] Phone: [phone]   Fax:  [phone]  Name: James Gallegos MRN: [id number] Date of Birth: [birth year]

## 2018-09-01 DIAGNOSIS — Z760 Encounter for issue of repeat prescription: Secondary | ICD-10-CM | POA: Diagnosis not present

## 2018-09-04 ENCOUNTER — Ambulatory Visit: Payer: 59 | Admitting: Physical Therapy

## 2018-09-05 ENCOUNTER — Other Ambulatory Visit (INDEPENDENT_AMBULATORY_CARE_PROVIDER_SITE_OTHER): Payer: Self-pay | Admitting: Orthopedic Surgery

## 2018-09-06 ENCOUNTER — Other Ambulatory Visit (INDEPENDENT_AMBULATORY_CARE_PROVIDER_SITE_OTHER): Payer: Self-pay | Admitting: Orthopaedic Surgery

## 2018-09-06 ENCOUNTER — Ambulatory Visit: Payer: 59

## 2018-09-06 ENCOUNTER — Other Ambulatory Visit (INDEPENDENT_AMBULATORY_CARE_PROVIDER_SITE_OTHER): Payer: Self-pay | Admitting: Orthopedic Surgery

## 2018-09-06 MED ORDER — TRAMADOL HCL 50 MG PO TABS: 50.0000 mg | ORAL_TABLET | Freq: Three times a day (TID) | ORAL | 0 refills | Status: DC | PRN

## 2018-09-06 MED FILL — traMADol HCL 50 MG TABS: 50 | 10 days supply | Qty: 30 | Fill #0

## 2018-09-06 NOTE — Telephone Encounter (Signed)
Please advise 

## 2018-09-06 NOTE — Telephone Encounter (Signed)
I called patient 

## 2018-09-06 NOTE — Telephone Encounter (Signed)
Sent via imprivata

## 2018-09-11 ENCOUNTER — Ambulatory Visit (INDEPENDENT_AMBULATORY_CARE_PROVIDER_SITE_OTHER): Payer: 59 | Admitting: Orthopaedic Surgery

## 2018-09-11 ENCOUNTER — Other Ambulatory Visit: Payer: Self-pay

## 2018-09-11 ENCOUNTER — Encounter (INDEPENDENT_AMBULATORY_CARE_PROVIDER_SITE_OTHER): Payer: Self-pay | Admitting: Orthopaedic Surgery

## 2018-09-11 VITALS — BP 147/74 | HR 65 | Ht 69.0 in | Wt 213.0 lb

## 2018-09-11 DIAGNOSIS — Z96651 Presence of right artificial knee joint: Secondary | ICD-10-CM

## 2018-09-11 NOTE — Progress Notes (Signed)
Office Visit Note   Patient: James Gallegos           Date of Birth: 08/07/1950           MRN: [id number] Visit Date: 09/11/2018              Requested by: James Contes, MD [address] [place], [place] [address] PCP: James Contes, MD   Assessment & Plan: Visit Diagnoses:  1. S/P total knee replacement using cement, right     Plan:  #1: Continue home exercise program. #2: Follow back up 6 weeks for recheck evaluation  Follow-Up Instructions: Return in about 6 weeks (around 10/23/2018).   Orders:  No orders of the defined types were placed in this encounter.  No orders of the defined types were placed in this encounter.     Procedures: No procedures performed   Clinical Data: No additional findings.   Subjective: Chief Complaint  Patient presents with  . Left Knee - Routine Post Op    Left TKA DOS 07/25/18    HPI  6 weeks status post left total knee replacement.  He is doing very well overall.  Still has a little bit of pain but is doing his exercises religiously.  Happy with his result so far.  Apparently his temperature is stopped with the cessation of oxycodone.  His only complaint is basically that of hypersensitivity at the scar.  Review of Systems  Constitutional: Negative for fatigue.  HENT: Negative for trouble swallowing.   Eyes: Negative for pain.  Respiratory: Negative for wheezing.   Cardiovascular: Negative for chest pain.  Gastrointestinal: Negative for constipation.  Endocrine: Negative for cold intolerance.  Genitourinary: Negative for difficulty urinating.  Musculoskeletal: Positive for joint swelling.  Skin: Negative for rash.  Allergic/Immunologic: Negative for food allergies.  Neurological: Negative for weakness.  Hematological: Does not bruise/bleed easily.  Psychiatric/Behavioral: Positive for sleep disturbance   Objective: Vital Signs: BP (!) 147/74   Pulse 65   Ht 5\' 9"  (1.753 m)   Wt 213 lb (96.6  kg)   BMI 31.45 kg/m   Physical Exam Constitutional:      Appearance: He is well-developed.  Eyes:     Pupils: Pupils are equal, round, and reactive to light.  Pulmonary:     Effort: Pulmonary effort is normal.  Skin:    General: Skin is warm and dry.  Neurological:     Mental Status: He is alert and oriented to person, place, and time.  Psychiatric:        Behavior: Behavior normal.     Ortho Exam  Exam today reveals the wound to be healing per primam with no signs of infection.  He has near full extension to about 120 degrees of flexion.  Good ligament stability.  Calf supple nontender.  Neuro vas intact distally.  Specialty Comments:  No specialty comments available.  Imaging: No results found.   PMFS History: Current Outpatient Medications  Medication Sig Dispense Refill  . acetaminophen (TYLENOL) 325 MG tablet Take 2 tablets (650 mg total) by mouth every 6 (six) hours.    . Ascorbic Acid (VITAMIN C) 1000 MG tablet Take 5,000 mg by mouth daily.    Marland Kitchen aspirin EC 325 MG EC tablet Take 1 tablet (325 mg total) by mouth daily with breakfast. 30 tablet 0  . B Complex-C (B-COMPLEX WITH VITAMIN C) tablet Take 1 tablet by mouth daily.    . Cholecalciferol (VITAMIN D-3) 5000 units TABS  Take 5,000 Units by mouth daily.    . Cyanocobalamin (VITAMIN B-12) 5000 MCG TBDP Take 5,000 mcg by mouth daily.    Marland Kitchen DHEA 50 MG CAPS Take 100 mg by mouth daily.    . Diindolylmethane POWD Take 250 mg by mouth daily.    . fluticasone (FLONASE) 50 MCG/ACT nasal spray Place 1 spray into both nostrils daily as needed for allergies or rhinitis.    James Gallegos Oil 500 MG CAPS Take 500 mg by mouth daily.    Marland Kitchen losartan-hydrochlorothiazide (HYZAAR) 100-25 MG tablet TAKE 1 TABLET BY MOUTH DAILY. 90 tablet 1  . Lysine HCl 1000 MG TABS Take 2,000 mg by mouth daily.    . Magnesium 400 MG TABS Take by mouth daily at 8 pm.     . methocarbamol (ROBAXIN) 500 MG tablet Take 1 tablet (500 mg total) by mouth every 8  (eight) hours as needed for muscle spasms. 30 tablet 0  . methocarbamol (ROBAXIN) 500 MG tablet Take 1 tablet (500 mg total) by mouth 4 (four) times daily. 30 tablet 1  . methocarbamol (ROBAXIN) 500 MG tablet Take 1 tablet (500 mg total) by mouth 4 (four) times daily. 30 tablet 0  . oxyCODONE (OXY IR/ROXICODONE) 5 MG immediate release tablet Take 1-2 tablets (5-10 mg total) by mouth every 4 (four) hours as needed for moderate pain (pain score 4-6). 30 tablet 0  . QUERCETIN PO Take 1,000 mg by mouth daily.    . saw palmetto 160 MG capsule Take 320 mg by mouth daily.    . traMADol (ULTRAM) 50 MG tablet Take 1 tablet (50 mg total) by mouth every 6 (six) hours as needed (1-2 tabs po bid prn). 30 tablet 0  . traMADol (ULTRAM) 50 MG tablet Take 1 tablet (50 mg total) by mouth every 6 (six) hours as needed. 30 tablet 0  . traMADol (ULTRAM) 50 MG tablet Take 1 tablet (50 mg total) by mouth 3 (three) times daily as needed. 30 tablet 0   No current facility-administered medications for this visit.     Patient Active Problem List   Diagnosis Date Noted  . Primary osteoarthritis of left knee 07/25/2018  . History of left knee replacement 07/25/2018  . Right foot pain 06/22/2018  . Acute left-sided low back pain with left-sided sciatica 06/22/2018  . Osteoarthritis 11/21/2017  . Hyperlipidemia 11/21/2017  . Low testosterone 09/26/2016  . Healthcare maintenance 09/26/2016  . S/P total knee replacement using cement, right 06/01/2016  . Sleep apnea 05/19/2016  . Hypertension 05/19/2016   Past Medical History:  Diagnosis Date  . Arthritis    back & L knee, R foot - lis franc   . Deep vein thrombosis ([place])    after knee surgery in 2004  . GERD (gastroesophageal reflux disease)    only with pizza   . Hypertension   . Murmur, cardiac    pt. reports its difficult to auscultate   . Sleep apnea    CPAP- last study- 2014    Family History  Problem Relation Age of Onset  . Alzheimer's disease  Father   . Colon cancer Neg Hx   . Esophageal cancer Neg Hx   . Rectal cancer Neg Hx   . Stomach cancer Neg Hx     Past Surgical History:  Procedure Laterality Date  . CERVICAL SPINE SURGERY N/A 1990   C5-6 discectomy  . FINGER SURGERY Right 2008   x 3 index finger  . KNEE ARTHROPLASTY    .  KNEE ARTHROSCOPY Bilateral    L 2004, R 2005  . MASS EXCISION Right 01/18/2017   Procedure: EXCISION BENIGN RIGHT NECK MASS;  Surgeon: Rozetta Nunnery, MD;  Location: [place];  Service: ENT;  Laterality: Right;  . NASAL FRACTURE SURGERY    . 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 TOTAL KNEE ARTHROPLASTY Right 06/01/2016   Procedure: TOTAL KNEE ARTHROPLASTY;  Surgeon: [name], MD;  Location: [place];  Service: Orthopedics;  Laterality: Right;  . TOTAL KNEE ARTHROPLASTY Left 07/25/2018   Procedure: LEFT TOTAL KNEE ARTHROPLASTY;  Surgeon: [name], MD;  Location: [place];  Service: Orthopedics;  Laterality: Left;   Social History   Occupational History  . Not on file  Tobacco Use  . Smoking status: Never Smoker  . Smokeless tobacco: Never Used  Substance and Sexual Activity  . Alcohol use: Yes    Comment: rarely  . Drug use: No  . Sexual activity: Not on file

## 2018-09-12 ENCOUNTER — Ambulatory Visit: Payer: 59 | Admitting: Physical Therapy

## 2018-09-12 ENCOUNTER — Ambulatory Visit (INDEPENDENT_AMBULATORY_CARE_PROVIDER_SITE_OTHER): Payer: 59 | Admitting: Orthopaedic Surgery

## 2018-09-14 ENCOUNTER — Ambulatory Visit: Payer: 59 | Admitting: Physical Therapy

## 2018-09-14 ENCOUNTER — Telehealth: Payer: Self-pay | Admitting: Physical Therapy

## 2018-09-14 NOTE — Telephone Encounter (Signed)
James Gallegos was contacted today regarding the temporary reduction of OP Rehab Services due to concerns for community transmission of Covid-19. A voicemail was left   Therapist advised the patient to continue to perform their HEP.   The patient was offered the continuation in their POC by using methods such as an e-visit, virtual check in, or telehealth visit. He was advised to call back if this is something that interests him.    Outpatient Rehabilitation Services will follow up with this client when we are able to safely resume care at the [facility] in person.   Patient is aware we can be reached by telephone during limited business hours in the meantime.

## 2018-09-19 ENCOUNTER — Ambulatory Visit: Payer: 59

## 2018-09-19 ENCOUNTER — Telehealth: Payer: Self-pay | Admitting: Physical Therapy

## 2018-09-19 NOTE — Telephone Encounter (Signed)
Spoke with patient about his knee. He feels like manual therapy for his range of motion would be most beneficial. He would benefit from a visit in the clinic to assess motion and come up with a plan going forward. Therapy will call patient to schedule.

## 2018-09-21 ENCOUNTER — Ambulatory Visit: Payer: 59 | Admitting: Physical Therapy

## 2018-09-21 ENCOUNTER — Other Ambulatory Visit: Payer: Self-pay | Admitting: Internal Medicine

## 2018-09-21 MED FILL — LOSARTAN-HCTZ 100-25 MG TAB: 100-25 | 30 days supply | Qty: 30 | Fill #0

## 2018-09-26 ENCOUNTER — Encounter: Payer: 59 | Admitting: Physical Therapy

## 2018-09-28 ENCOUNTER — Encounter: Payer: Self-pay | Admitting: Physical Therapy

## 2018-09-28 ENCOUNTER — Other Ambulatory Visit: Payer: Self-pay

## 2018-09-28 ENCOUNTER — Encounter: Payer: 59 | Admitting: Physical Therapy

## 2018-09-28 ENCOUNTER — Ambulatory Visit: Payer: 59 | Attending: Orthopaedic Surgery | Admitting: Physical Therapy

## 2018-09-28 DIAGNOSIS — M25562 Pain in left knee: Secondary | ICD-10-CM

## 2018-09-28 DIAGNOSIS — M6281 Muscle weakness (generalized): Secondary | ICD-10-CM | POA: Diagnosis not present

## 2018-09-28 DIAGNOSIS — R600 Localized edema: Secondary | ICD-10-CM

## 2018-09-28 DIAGNOSIS — R262 Difficulty in walking, not elsewhere classified: Secondary | ICD-10-CM | POA: Diagnosis not present

## 2018-09-28 NOTE — Therapy (Signed)
[place], [place], [address] Phone: [phone]   Fax:  [phone]  Physical Therapy Treatment/Progress Note   Patient Details  Name: James Gallegos MRN: [id number] Date of Birth: December 19, 1950 Referring Provider (PT): Dr [name] Progress Note Reporting Period 08/18/2018  09/28/2018  See note below for Objective Data and Assessment of Progress/Goals.        Encounter Date: 09/28/2018  PT End of Session - 09/28/18 1028    Visit Number  6    Number of Visits  18    Date for PT Re-Evaluation  10/26/18    Authorization Type  [employer]    PT Start Time  1018    PT Stop Time  1100    PT Time Calculation (min)  42 min    Activity Tolerance  Patient tolerated treatment well    Behavior During Therapy  WFL for tasks assessed/performed       Past Medical History:  Diagnosis Date  . Arthritis    back & L knee, R foot - lis franc   . Deep vein thrombosis ([place])    after knee surgery in 2004  . GERD (gastroesophageal reflux disease)    only with pizza   . Hypertension   . Murmur, cardiac    pt. reports its difficult to auscultate   . Sleep apnea    CPAP- last study- 2014    Past Surgical History:  Procedure Laterality Date  . CERVICAL SPINE SURGERY N/A 1990   C5-6 discectomy  . FINGER SURGERY Right 2008   x 3 index finger  . KNEE ARTHROPLASTY    . KNEE ARTHROSCOPY Bilateral    L 2004, R 2005  . MASS EXCISION Right 01/18/2017   Procedure: EXCISION BENIGN RIGHT NECK MASS;  Surgeon: Rozetta Nunnery, MD;  Location: [place];  Service: ENT;  Laterality: Right;  . NASAL FRACTURE SURGERY    . 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 TOTAL KNEE ARTHROPLASTY Right 06/01/2016   Procedure: TOTAL KNEE ARTHROPLASTY;  Surgeon: [name], MD;  Location: [facility] [place];  Service: Orthopedics;  Laterality: Right;  . TOTAL KNEE ARTHROPLASTY Left 07/25/2018   Procedure: LEFT TOTAL  KNEE ARTHROPLASTY;  Surgeon: [name], MD;  Location: [facility];  Service: Orthopedics;  Laterality: Left;    There were no vitals filed for this visit.  Subjective Assessment - 09/28/18 1055    Subjective  Patient continues to report nerve pain. He can not sleep at night because his knee is so sensative. He has been wrapping it. He continues to work on his exercises at home.     Patient Stated Goals  less pain in the knee and flex 120 degrees.     Currently in Pain?  No/denies         [facility] PT Assessment - 09/28/18 0001      AROM   Left Knee Flexion  115      Strength   Left Hip Flexion  5/5    Left Hip Extension  5/5    Left Knee Flexion  5/5    Left Knee Extension  5/5                   [facility] Adult PT Treatment/Exercise - 09/28/18 0001      Knee/Hip Exercises: Stretches   Active Hamstring Stretch  3 reps;30 seconds;Left   PNF contract/ relax with 10 sec hold   Quad  Stretch  3 reps;30 seconds;Left   PNF contract/ relax with 10 sec hold     Knee/Hip Exercises: Aerobic   Recumbent Bike  L1 x 5 min    full revolutions     Knee/Hip Exercises: Standing   Step Down  2 sets;Left;Step Height: 4";10 reps    Wall Squat  2 sets;10 reps    Wall Squat Limitations  --      Knee/Hip Exercises: Seated   Long Arc Quad  Strengthening;Left;2 sets;10 reps    Long Arc Quad Weight  8 lbs.    Hamstring Curl  Strengthening;Left;3 sets;10 reps    Hamstring Limitations  black t-band    Sit to [employer]  20 reps;without UE support   with light tap on mat     Knee/Hip Exercises: Supine   Straight Leg Raises  20 reps    Straight Leg Raises Limitations  3lb       Manual Therapy   Joint Mobilization  supine PA and AP glides grade III. seated     Passive ROM  L knee flexion combined with R knee active extension for reciprocal inhibtion              PT Education - 09/28/18 1057    Education Details  reviewed stretches and exercises for home.     Person(s) Educated   Patient    Methods  Demonstration;Explanation;Tactile cues;Verbal cues    Comprehension  Returned demonstration;Verbalized understanding;Tactile cues required;Verbal cues required          PT Long Term Goals - 08/17/18 1255      PT LONG TERM GOAL #1   Title  Patient will increase gross Lt knee flexion movement to 0-120 degrees without pain in order to get up and down off a low chair. ( 09/28/2018)     Time  6    Period  Weeks    Status  New    Target Date  09/28/18      PT LONG TERM GOAL #2   Title  Patient will ambulate 3000' without increased pain and without an antalgic gait in order to return to work  on even and uneven surfaces ( 09/28/2018)     Time  6    Period  Weeks    Status  New    Target Date  09/28/18      PT LONG TERM GOAL #3   Title  Patient will be independent with final HEP for LE strength and stability ( 09/28/2018)     Time  6    Period  Weeks    Status  New    Target Date  09/28/18      PT LONG TERM GOAL #4   Title  Patient will demsotrate a 44% limitation on FOTO  ( 09/28/2018)     Time  6    Period  Weeks    Status  New    Target Date  09/28/18      PT LONG TERM GOAL #5   Title  Patient will go up/down 8 steps without increased pain in order to perfrom ADLs and get in/out of houses safely for work ( 09/28/2018)     Time  6    Period  Weeks    Status  New    Target Date  09/28/18            Plan - 09/28/18 1057    Clinical Impression Statement  The patients strength and range have progressed.  He still feels like he is having some tightness in his knee. he would like to contionue working on his knee for another 3-4 weeks to make sure it is sads far along as it can be. His other knee stiffened up significantly following therapy. He tolerated ther-ex well today. he was given bands to continue with resitance training at home.     Comorbidities  lumbar arthritis, HTN, 2 cervical surgeries, h/o Lt DVT, Rt TKA    Examination-Activity Limitations   Bathing;Stairs;Sleep    Stability/Clinical Decision Making  Stable/Uncomplicated    Clinical Decision Making  Low    Rehab Potential  Excellent    PT Frequency  2x / week    PT Duration  4 weeks    PT Treatment/Interventions  Iontophoresis 4mg /ml Dexamethasone;Balance training;Neuromuscular re-education;Scar mobilization;Passive range of motion;Dry needling;Manual techniques;Functional mobility training;Ultrasound;Moist Heat;Joint Manipulations;Patient/family education;Cryotherapy;Electrical Stimulation;Therapeutic exercise;Vasopneumatic Device;Taping    PT Next Visit Plan  eccentric quad work, Lt knee ROM    PT Home Exercise Plan  knee stretching and strengthening exercises     Consulted and Agree with Plan of Care  Patient       Patient will benefit from skilled therapeutic intervention in order to improve the following deficits and impairments:     Visit Diagnosis: Muscle weakness (generalized)  Acute pain of left knee  Difficulty in walking, not elsewhere classified  Localized edema     Problem List Patient Active Problem List   Diagnosis Date Noted  . Primary osteoarthritis of left knee 07/25/2018  . History of left knee replacement 07/25/2018  . Right foot pain 06/22/2018  . Acute left-sided low back pain with left-sided sciatica 06/22/2018  . Osteoarthritis 11/21/2017  . Hyperlipidemia 11/21/2017  . Low testosterone 09/26/2016  . Healthcare maintenance 09/26/2016  . S/P total knee replacement using cement, right 06/01/2016  . Sleep apnea 05/19/2016  . Hypertension 05/19/2016    Carney Living PT DPT  09/28/2018, 12:58 PM  [facility] [address] [place], [place], [address] Phone: [phone]   Fax:  [phone]  Name: James Gallegos MRN: [id number] Date of Birth: May 30, 1951

## 2018-10-03 ENCOUNTER — Other Ambulatory Visit: Payer: Self-pay

## 2018-10-03 ENCOUNTER — Ambulatory Visit: Payer: 59 | Admitting: Physical Therapy

## 2018-10-03 ENCOUNTER — Encounter: Payer: Self-pay | Admitting: Physical Therapy

## 2018-10-03 DIAGNOSIS — R262 Difficulty in walking, not elsewhere classified: Secondary | ICD-10-CM

## 2018-10-03 DIAGNOSIS — M6281 Muscle weakness (generalized): Secondary | ICD-10-CM | POA: Diagnosis not present

## 2018-10-03 DIAGNOSIS — R600 Localized edema: Secondary | ICD-10-CM | POA: Diagnosis not present

## 2018-10-03 DIAGNOSIS — M25562 Pain in left knee: Secondary | ICD-10-CM

## 2018-10-04 ENCOUNTER — Encounter: Payer: Self-pay | Admitting: Physical Therapy

## 2018-10-04 NOTE — Therapy (Signed)
[place], [place], [address] Phone: [phone]   Fax:  [phone]  Physical Therapy Treatment  Patient Details  Name: James Gallegos MRN: [id number] Date of Birth: [birth year] Referring Provider (PT): Dr [name]   Encounter Date: 10/03/2018  PT End of Session - 10/04/18 0951    Visit Number  7    Number of Visits  18    Date for PT Re-Evaluation  10/26/18    Authorization Type  [employer]    PT Start Time  1446    PT Stop Time  1530    PT Time Calculation (min)  44 min    Activity Tolerance  Patient tolerated treatment well    Behavior During Therapy  [facility] for tasks assessed/performed       Past Medical History:  Diagnosis Date  . Arthritis    back & L knee, R foot - lis franc   . Deep vein thrombosis ([place])    after knee surgery in 2004  . GERD (gastroesophageal reflux disease)    only with pizza   . Hypertension   . Murmur, cardiac    pt. reports its difficult to auscultate   . Sleep apnea    CPAP- last study- 2014    Past Surgical History:  Procedure Laterality Date  . CERVICAL SPINE SURGERY N/A 1990   C5-6 discectomy  . FINGER SURGERY Right 2008   x 3 index finger  . KNEE ARTHROPLASTY    . KNEE ARTHROSCOPY Bilateral    L 2004, R 2005  . MASS EXCISION Right 01/18/2017   Procedure: EXCISION BENIGN RIGHT NECK MASS;  Surgeon: Rozetta Nunnery, MD;  Location: [place];  Service: ENT;  Laterality: Right;  . NASAL FRACTURE SURGERY    . 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 TOTAL KNEE ARTHROPLASTY Right 06/01/2016   Procedure: TOTAL KNEE ARTHROPLASTY;  Surgeon: [name], MD;  Location: [place];  Service: Orthopedics;  Laterality: Right;  . TOTAL KNEE ARTHROPLASTY Left 07/25/2018   Procedure: LEFT TOTAL KNEE ARTHROPLASTY;  Surgeon: [name], MD;  Location: [place];  Service: Orthopedics;  Laterality: Left;    There were no vitals filed for  this visit.  Subjective Assessment - 10/03/18 1452    Subjective  Last night the patient had a time of significantly sharp pain into his knee. He reports the pain was signifanly high but did not last. It happened again that night. It has happened 1x today but it was not as intense     Currently in Pain?  No/denies                       [facility] Adult PT Treatment/Exercise - 10/04/18 0001      Knee/Hip Exercises: Stretches   Active Hamstring Stretch  3 reps;30 seconds;Left   PNF contract/ relax with 10 sec hold   Quad Stretch  3 reps;30 seconds;Left   PNF contract/ relax with 10 sec hold     Knee/Hip Exercises: Aerobic   Recumbent Bike  L1 x 5 min    full revolutions     Knee/Hip Exercises: Standing   Lateral Step Up  2 sets;10 reps;Step Height: 8"    Forward Step Up  2 sets;10 reps;Step Height: 8"    Step Down  2 sets;Left;Step Height: 4";10 reps    Wall Squat  2 sets;10 reps      Knee/Hip Exercises: Seated  Long Arc [employer]  Strengthening;Left;2 sets;10 reps    [employer]  8 lbs.    Hamstring Curl  Strengthening;Left;3 sets;10 reps    Hamstring Limitations  black t-band    Sit to Sand  20 reps;without UE support   with light tap on mat     Knee/Hip Exercises: Supine   Straight Leg Raises  20 reps    Straight Leg Raises Limitations  3lb       Manual Therapy   Joint Mobilization  supine PA and AP glides grade III. 4 way patellar glides     Passive ROM  L knee flexion combined with R knee active extension for reciprocal inhibtion              PT Education - 10/04/18 0939    Education Details  reviewed rest , ice, compression, elevation    Person(s) Educated  Patient    Methods  Explanation;Demonstration;Verbal cues;Tactile cues    Comprehension  Verbalized understanding;Returned demonstration;Verbal cues required;Tactile cues required          PT Long Term Goals - 10/04/18 1002      PT LONG TERM GOAL #1   Title  Patient will increase  gross Lt knee flexion movement to 0-120 degrees without pain in order to get up and down off a low chair. ( 09/28/2018)     Baseline  0-115    Time  6    Period  Weeks    Status  On-going      PT LONG TERM GOAL #2   Title  Patient will ambulate 3000' without increased pain and without an antalgic gait in order to return to work  on even and uneven surfaces ( 09/28/2018)     Baseline  no antalgic gait     Time  6    Period  Weeks    Status  Achieved      PT LONG TERM GOAL #3   Title  Patient will be independent with final HEP for LE strength and stability ( 09/28/2018)     Baseline  perfroming exercises at home     Time  6    Period  Weeks    Status  Achieved      PT LONG TERM GOAL #4   Title  Patient will demsotrate a 44% limitation on FOTO  ( 09/28/2018)     Baseline  not taken today     Time  6    Period  Weeks    Status  On-going      PT LONG TERM GOAL #5   Title  Patient will go up/down 8 steps without increased pain in order to perfrom ADLs and get in/out of houses safely for work ( 09/28/2018)     Baseline  able to go up/down 8 inch step today in clinic; some difficulty going down     Time  6    Period  Weeks    Status  On-going            Plan - 10/04/18 0955    Clinical Impression Statement  Therapy was able to mildyl reproduce pain with extension stretching. After knee cap mobilization that pain went away. It came back towards the end of the session when he was on the leg press. He otherwise tolerated treatment well.     Comorbidities  lumbar arthritis, HTN, 2 cervical surgeries, h/o Lt DVT, Rt TKA    Examination-Activity Limitations  Bathing;Stairs;Sleep  PT Treatment/Interventions  Iontophoresis 4mg /ml Dexamethasone;Balance training;Neuromuscular re-education;Scar mobilization;Passive range of motion;Dry needling;Manual techniques;Functional mobility training;Ultrasound;Moist Heat;Joint Manipulations;Patient/family education;Cryotherapy;Electrical  Stimulation;Therapeutic exercise;Vasopneumatic Device;Taping    PT Next Visit Plan  eccentric quad work, Lt knee ROM    PT Home Exercise Plan  knee stretching and strengthening exercises     Consulted and Agree with Plan of Care  Patient       Patient will benefit from skilled therapeutic intervention in order to improve the following deficits and impairments:  Difficulty walking, Pain, Decreased scar mobility, Decreased mobility, Decreased range of motion, Decreased strength, Increased edema  Visit Diagnosis: Muscle weakness (generalized)  Acute pain of left knee  Difficulty in walking, not elsewhere classified  Localized edema     Problem List Patient Active Problem List   Diagnosis Date Noted  . Primary osteoarthritis of left knee 07/25/2018  . History of left knee replacement 07/25/2018  . Right foot pain 06/22/2018  . Acute left-sided low back pain with left-sided sciatica 06/22/2018  . Osteoarthritis 11/21/2017  . Hyperlipidemia 11/21/2017  . Low testosterone 09/26/2016  . Healthcare maintenance 09/26/2016  . S/P total knee replacement using cement, right 06/01/2016  . Sleep apnea 05/19/2016  . Hypertension 05/19/2016    Carney Living PT DPT  10/04/2018, 10:08 AM  [facility] [address] [place], [place], [address] Phone: [phone]   Fax:  [phone]  Name: James Gallegos MRN: [id number] Date of Birth: [birth year]

## 2018-10-05 ENCOUNTER — Other Ambulatory Visit: Payer: Self-pay

## 2018-10-05 ENCOUNTER — Ambulatory Visit: Payer: 59 | Admitting: Physical Therapy

## 2018-10-05 DIAGNOSIS — R262 Difficulty in walking, not elsewhere classified: Secondary | ICD-10-CM

## 2018-10-05 DIAGNOSIS — R600 Localized edema: Secondary | ICD-10-CM | POA: Diagnosis not present

## 2018-10-05 DIAGNOSIS — M25562 Pain in left knee: Secondary | ICD-10-CM | POA: Diagnosis not present

## 2018-10-05 DIAGNOSIS — M6281 Muscle weakness (generalized): Secondary | ICD-10-CM | POA: Diagnosis not present

## 2018-10-05 NOTE — Therapy (Signed)
[place], [place], [address] Phone: [phone]   Fax:  [phone]  Physical Therapy Treatment  Patient Details  Name: James Gallegos MRN: [id number] Date of Birth: 04/26/1951 Referring Provider (PT): Dr [name]   Encounter Date: 10/05/2018  PT End of Session - 10/05/18 0955    Visit Number  8    Number of Visits  18    Date for PT Re-Evaluation  10/26/18    Authorization Type  [employer]    PT Start Time  0930    PT Stop Time  1012    PT Time Calculation (min)  42 min    Activity Tolerance  Patient tolerated treatment well    Behavior During Therapy  [facility] for tasks assessed/performed       Past Medical History:  Diagnosis Date  . Arthritis    back & L knee, R foot - lis franc   . Deep vein thrombosis ([place])    after knee surgery in 2004  . GERD (gastroesophageal reflux disease)    only with pizza   . Hypertension   . Murmur, cardiac    pt. reports its difficult to auscultate   . Sleep apnea    CPAP- last study- 2014    Past Surgical History:  Procedure Laterality Date  . CERVICAL SPINE SURGERY N/A 1990   C5-6 discectomy  . FINGER SURGERY Right 2008   x 3 index finger  . KNEE ARTHROPLASTY    . KNEE ARTHROSCOPY Bilateral    L 2004, R 2005  . MASS EXCISION Right 01/18/2017   Procedure: EXCISION BENIGN RIGHT NECK MASS;  Surgeon: Rozetta Nunnery, MD;  Location: [place];  Service: ENT;  Laterality: Right;  . NASAL FRACTURE SURGERY    . 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 TOTAL KNEE ARTHROPLASTY Right 06/01/2016   Procedure: TOTAL KNEE ARTHROPLASTY;  Surgeon: [name], MD;  Location: [place];  Service: Orthopedics;  Laterality: Right;  . TOTAL KNEE ARTHROPLASTY Left 07/25/2018   Procedure: LEFT TOTAL KNEE ARTHROPLASTY;  Surgeon: [name], MD;  Location: [place];  Service: Orthopedics;  Laterality: Left;    There were no vitals filed for  this visit.  Subjective Assessment - 10/05/18 0953    Subjective  Patient reports that since he stopped using the wrap e has not had the sahrp pain. He still has the nerve discomfort but no real pain today.     Patient Stated Goals  less pain in the knee and flex 120 degrees.     Currently in Pain?  No/denies                       [facility] Adult PT Treatment/Exercise - 10/05/18 0001      Knee/Hip Exercises: Machines for Strengthening   Cybex Leg Press  2x10 40 lbs       Knee/Hip Exercises: Standing   Hip Abduction  2 sets;10 reps;Limitations    Abduction Limitations  2lb     Lateral Step Up  Step Height: 8";1 set;15 reps    Forward Step Up  2 sets;Step Height: 8";15 reps    Step Down  2 sets;Left;Step Height: 4";10 reps    Functional Squat  2 sets;10 reps    Other Standing Knee Exercises  step onto air-ex 2x10       Knee/Hip Exercises: Seated   Long Arc Quad  Strengthening;Left;2 sets;10 reps  Long Arc Quad Weight  4 lbs.    Heel Slides Limitations  Hamstring curl x30 green       Knee/Hip Exercises: Supine   Straight Leg Raises  20 reps    Straight Leg Raises Limitations  3lb       Manual Therapy   Joint Mobilization  supine PA and AP glides grade III. 4 way patellar glides     Passive ROM  L knee flexion combined with R knee active extension for reciprocal inhibtion              PT Education - 10/05/18 0954    Person(s) Educated  Patient    Methods  Explanation;Demonstration;Tactile cues;Verbal cues    Comprehension  Verbalized understanding;Returned demonstration;Verbal cues required;Tactile cues required          PT Long Term Goals - 10/04/18 1002      PT LONG TERM GOAL #1   Title  Patient will increase gross Lt knee flexion movement to 0-120 degrees without pain in order to get up and down off a low chair. ( 09/28/2018)     Baseline  0-115    Time  6    Period  Weeks    Status  On-going      PT LONG TERM GOAL #2   Title  Patient will  ambulate 3000' without increased pain and without an antalgic gait in order to return to work  on even and uneven surfaces ( 09/28/2018)     Baseline  no antalgic gait     Time  6    Period  Weeks    Status  Achieved      PT LONG TERM GOAL #3   Title  Patient will be independent with final HEP for LE strength and stability ( 09/28/2018)     Baseline  perfroming exercises at home     Time  6    Period  Weeks    Status  Achieved      PT LONG TERM GOAL #4   Title  Patient will demsotrate a 44% limitation on FOTO  ( 09/28/2018)     Baseline  not taken today     Time  6    Period  Weeks    Status  On-going      PT LONG TERM GOAL #5   Title  Patient will go up/down 8 steps without increased pain in order to perfrom ADLs and get in/out of houses safely for work ( 09/28/2018)     Baseline  able to go up/down 8 inch step today in clinic; some difficulty going down     Time  6    Period  Weeks    Status  On-going            Plan - 10/05/18 0956    Clinical Impression Statement  Patient tolerated treatment well. Therapy could not reproduce shar pain. Patient was able to perfrom weight training without difficulty. He is making good progress overall. Therapy had him peorfrom 4 inch step down which was a little difficult.     Comorbidities  lumbar arthritis, HTN, 2 cervical surgeries, h/o Lt DVT, Rt TKA    Examination-Activity Limitations  Bathing;Stairs;Sleep    Examination-Participation Restrictions  Community Activity    Stability/Clinical Decision Making  Stable/Uncomplicated    Clinical Decision Making  Low    Rehab Potential  Excellent    PT Frequency  2x / week    PT Duration  4  weeks    PT Treatment/Interventions  Iontophoresis 4mg /ml Dexamethasone;Balance training;Neuromuscular re-education;Scar mobilization;Passive range of motion;Dry needling;Manual techniques;Functional mobility training;Ultrasound;Moist Heat;Joint Manipulations;Patient/family education;Cryotherapy;Electrical  Stimulation;Therapeutic exercise;Vasopneumatic Device;Taping    PT Next Visit Plan  continue to progress functional strengthening as tolerated    Consulted and Agree with Plan of Care  Patient       Patient will benefit from skilled therapeutic intervention in order to improve the following deficits and impairments:  Difficulty walking, Pain, Decreased scar mobility, Decreased mobility, Decreased range of motion, Decreased strength, Increased edema  Visit Diagnosis: Muscle weakness (generalized)  Acute pain of left knee  Difficulty in walking, not elsewhere classified  Localized edema     Problem List Patient Active Problem List   Diagnosis Date Noted  . Primary osteoarthritis of left knee 07/25/2018  . History of left knee replacement 07/25/2018  . Right foot pain 06/22/2018  . Acute left-sided low back pain with left-sided sciatica 06/22/2018  . Osteoarthritis 11/21/2017  . Hyperlipidemia 11/21/2017  . Low testosterone 09/26/2016  . Healthcare maintenance 09/26/2016  . S/P total knee replacement using cement, right 06/01/2016  . Sleep apnea 05/19/2016  . Hypertension 05/19/2016    Carney Living PT DPT  10/05/2018, 10:22 AM  [facility] [address] [place], [place], [address] Phone: [phone]   Fax:  [phone]  Name: TASHON CAPP MRN: [id number] Date of Birth: 1951-01-07

## 2018-10-10 DIAGNOSIS — G4733 Obstructive sleep apnea (adult) (pediatric): Secondary | ICD-10-CM | POA: Diagnosis not present

## 2018-10-12 ENCOUNTER — Other Ambulatory Visit: Payer: Self-pay

## 2018-10-12 ENCOUNTER — Encounter: Payer: Self-pay | Admitting: Physical Therapy

## 2018-10-12 ENCOUNTER — Ambulatory Visit: Payer: 59 | Admitting: Physical Therapy

## 2018-10-12 DIAGNOSIS — R600 Localized edema: Secondary | ICD-10-CM | POA: Diagnosis not present

## 2018-10-12 DIAGNOSIS — M6281 Muscle weakness (generalized): Secondary | ICD-10-CM | POA: Diagnosis not present

## 2018-10-12 DIAGNOSIS — M25562 Pain in left knee: Secondary | ICD-10-CM

## 2018-10-12 DIAGNOSIS — R262 Difficulty in walking, not elsewhere classified: Secondary | ICD-10-CM | POA: Diagnosis not present

## 2018-10-12 NOTE — Therapy (Signed)
[place], [place], [address] Phone: [phone]   Fax:  [phone]  Physical Therapy Treatment  Patient Details  Name: James Gallegos MRN: [id number] Date of Birth: [birth year] Referring Provider (PT): Dr [name]   Encounter Date: 10/12/2018  PT End of Session - 10/12/18 1454    Visit Number  8    Number of Visits  9    Date for PT Re-Evaluation  10/26/18    Authorization Type  [employer]    PT Start Time  1447    PT Stop Time  1527    PT Time Calculation (min)  40 min    Activity Tolerance  Patient tolerated treatment well    Behavior During Therapy  [facility] for tasks assessed/performed       Past Medical History:  Diagnosis Date  . Arthritis    back & L knee, R foot - lis franc   . Deep vein thrombosis ([place])    after knee surgery in 2004  . GERD (gastroesophageal reflux disease)    only with pizza   . Hypertension   . Murmur, cardiac    pt. reports its difficult to auscultate   . Sleep apnea    CPAP- last study- 2014    Past Surgical History:  Procedure Laterality Date  . CERVICAL SPINE SURGERY N/A 1990   C5-6 discectomy  . FINGER SURGERY Right 2008   x 3 index finger  . KNEE ARTHROPLASTY    . KNEE ARTHROSCOPY Bilateral    L 2004, R 2005  . MASS EXCISION Right 01/18/2017   Procedure: EXCISION BENIGN RIGHT NECK MASS;  Surgeon: Rozetta Nunnery, MD;  Location: [place];  Service: ENT;  Laterality: Right;  . NASAL FRACTURE SURGERY    . 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 TOTAL KNEE ARTHROPLASTY Right 06/01/2016   Procedure: TOTAL KNEE ARTHROPLASTY;  Surgeon: [name], MD;  Location: [place];  Service: Orthopedics;  Laterality: Right;  . TOTAL KNEE ARTHROPLASTY Left 07/25/2018   Procedure: LEFT TOTAL KNEE ARTHROPLASTY;  Surgeon: [name], MD;  Location: [place];  Service: Orthopedics;  Laterality: Left;    There were no vitals filed for  this visit.  Subjective Assessment - 10/12/18 1452    Subjective  Patient reports he was sore and stiff yesterday. It is much better today. He has not had the sharp pain since he stopped using the wrap.     Patient Stated Goals  less pain in the knee and flex 120 degrees.     Currently in Pain?  No/denies    Pain Onset  More than a month ago    Pain Frequency  Intermittent                       [facility] Adult PT Treatment/Exercise - 10/12/18 0001      Knee/Hip Exercises: Stretches   Active Hamstring Stretch  3 reps;30 seconds;Left   PNF contract/ relax with 10 sec hold     Knee/Hip Exercises: Aerobic   Recumbent Bike  L1 x 5 min    full revolutions     Knee/Hip Exercises: Machines for Strengthening   Cybex Leg Press  2x10 60 lbs       Knee/Hip Exercises: Standing   Hip Abduction  2 sets;10 reps;Limitations    Abduction Limitations  5lb    Extension Limitations  x20 5lb     Lateral  Step Up  Step Height: 8";1 set;15 reps    Forward Step Up  2 sets;Step Height: 8";15 reps    Step Down  2 sets;Left;Step Height: 4";10 reps    Functional Squat  2 sets;10 reps      Knee/Hip Exercises: Seated   Long Arc Quad  Strengthening;Left;2 sets;10 reps    Long Arc Quad Weight  5 lbs.    Heel Slides Limitations  Hamstring curl x30 green       Manual Therapy   Joint Mobilization  supine PA and AP glides grade III. 4 way patellar glides     Passive ROM  L knee flexion combined with R knee active extension for reciprocal inhibtion              PT Education - 10/12/18 1453    Education Details  reviewed HEP,     Person(s) Educated  Patient    Methods  Explanation;Demonstration;Tactile cues    Comprehension  Verbalized understanding;Returned demonstration;Verbal cues required;Tactile cues required          PT Long Term Goals - 10/04/18 1002      PT LONG TERM GOAL #1   Title  Patient will increase gross Lt knee flexion movement to 0-120 degrees without pain in order  to get up and down off a low chair. ( 09/28/2018)     Baseline  0-115    Time  6    Period  Weeks    Status  On-going      PT LONG TERM GOAL #2   Title  Patient will ambulate 3000' without increased pain and without an antalgic gait in order to return to work  on even and uneven surfaces ( 09/28/2018)     Baseline  no antalgic gait     Time  6    Period  Weeks    Status  Achieved      PT LONG TERM GOAL #3   Title  Patient will be independent with final HEP for LE strength and stability ( 09/28/2018)     Baseline  perfroming exercises at home     Time  6    Period  Weeks    Status  Achieved      PT LONG TERM GOAL #4   Title  Patient will demsotrate a 44% limitation on FOTO  ( 09/28/2018)     Baseline  not taken today     Time  6    Period  Weeks    Status  On-going      PT LONG TERM GOAL #5   Title  Patient will go up/down 8 steps without increased pain in order to perfrom ADLs and get in/out of houses safely for work ( 09/28/2018)     Baseline  able to go up/down 8 inch step today in clinic; some difficulty going down     Time  6    Period  Weeks    Status  On-going            Plan - 10/12/18 1455    Clinical Impression Statement  Patient has full range of motion. He tolerated ther-ex well. Therapy had him use a 5lb weight for hip strengthening. Therapy ewill continue to progress the patient as tolerated.     Comorbidities  lumbar arthritis, HTN, 2 cervical surgeries, h/o Lt DVT, Rt TKA    Examination-Activity Limitations  Bathing;Stairs;Sleep    Clinical Decision Making  Low    Rehab Potential  Excellent    PT Treatment/Interventions  Iontophoresis 4mg /ml Dexamethasone;Balance training;Neuromuscular re-education;Scar mobilization;Passive range of motion;Dry needling;Manual techniques;Functional mobility training;Ultrasound;Moist Heat;Joint Manipulations;Patient/family education;Cryotherapy;Electrical Stimulation;Therapeutic exercise;Vasopneumatic Device;Taping    PT Next  Visit Plan  continue to progress functional strengthening as tolerated    PT Home Exercise Plan  knee stretching and strengthening exercises     Consulted and Agree with Plan of Care  Patient       Patient will benefit from skilled therapeutic intervention in order to improve the following deficits and impairments:  Difficulty walking, Pain, Decreased scar mobility, Decreased mobility, Decreased range of motion, Decreased strength, Increased edema  Visit Diagnosis: Muscle weakness (generalized)  Acute pain of left knee  Localized edema  Difficulty in walking, not elsewhere classified     Problem List Patient Active Problem List   Diagnosis Date Noted  . Primary osteoarthritis of left knee 07/25/2018  . History of left knee replacement 07/25/2018  . Right foot pain 06/22/2018  . Acute left-sided low back pain with left-sided sciatica 06/22/2018  . Osteoarthritis 11/21/2017  . Hyperlipidemia 11/21/2017  . Low testosterone 09/26/2016  . Healthcare maintenance 09/26/2016  . S/P total knee replacement using cement, right 06/01/2016  . Sleep apnea 05/19/2016  . Hypertension 05/19/2016    Carney Living PT DPT  10/12/2018, 4:25 PM  [facility] [address] [place], [place], [address] Phone: [phone]   Fax:  [phone]  Name: RACHAEL ZAPANTA MRN: [id number] Date of Birth: 20-Jun-1950

## 2018-10-18 ENCOUNTER — Ambulatory Visit: Payer: 59 | Attending: Orthopaedic Surgery | Admitting: Physical Therapy

## 2018-10-18 ENCOUNTER — Other Ambulatory Visit: Payer: Self-pay

## 2018-10-18 ENCOUNTER — Encounter: Payer: Self-pay | Admitting: Physical Therapy

## 2018-10-18 DIAGNOSIS — M6281 Muscle weakness (generalized): Secondary | ICD-10-CM | POA: Diagnosis not present

## 2018-10-18 DIAGNOSIS — R262 Difficulty in walking, not elsewhere classified: Secondary | ICD-10-CM

## 2018-10-18 DIAGNOSIS — R600 Localized edema: Secondary | ICD-10-CM

## 2018-10-18 DIAGNOSIS — M25562 Pain in left knee: Secondary | ICD-10-CM

## 2018-10-18 NOTE — Therapy (Signed)
[place], [place], [address] Phone: [phone]   Fax:  [phone]  Physical Therapy Treatment / Re-certification  Patient Details  Name: James Gallegos MRN: [id number] Date of Birth: 10/22/1950 Referring Provider (PT): Dr [name]   Encounter Date: 10/18/2018  PT End of Session - 10/18/18 1455    Visit Number  9    Number of Visits  12    Date for PT Re-Evaluation  11/15/18    Authorization Type  [employer]    PT Start Time  1446    PT Stop Time  1529    PT Time Calculation (min)  43 min    Activity Tolerance  Patient tolerated treatment well    Behavior During Therapy  [facility] for tasks assessed/performed       Past Medical History:  Diagnosis Date  . Arthritis    back & L knee, R foot - lis franc   . Deep vein thrombosis ([place])    after knee surgery in 2004  . GERD (gastroesophageal reflux disease)    only with pizza   . Hypertension   . Murmur, cardiac    pt. reports its difficult to auscultate   . Sleep apnea    CPAP- last study- 2014    Past Surgical History:  Procedure Laterality Date  . CERVICAL SPINE SURGERY N/A 1990   C5-6 discectomy  . FINGER SURGERY Right 2008   x 3 index finger  . KNEE ARTHROPLASTY    . KNEE ARTHROSCOPY Bilateral    L 2004, R 2005  . MASS EXCISION Right 01/18/2017   Procedure: EXCISION BENIGN RIGHT NECK MASS;  Surgeon: Rozetta Nunnery, MD;  Location: [place];  Service: ENT;  Laterality: Right;  . NASAL FRACTURE SURGERY    . 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 TOTAL KNEE ARTHROPLASTY Right 06/01/2016   Procedure: TOTAL KNEE ARTHROPLASTY;  Surgeon: [name], MD;  Location: [place];  Service: Orthopedics;  Laterality: Right;  . TOTAL KNEE ARTHROPLASTY Left 07/25/2018   Procedure: LEFT TOTAL KNEE ARTHROPLASTY;  Surgeon: [name], MD;  Location: [place];  Service: Orthopedics;  Laterality: Left;    There were no  vitals filed for this visit.  Subjective Assessment - 10/18/18 1450    Subjective  " I still have alittle pain which seems to be more weather related."    Patient Stated Goals  less pain in the knee and flex 120 degrees.     Currently in Pain?  Yes    Pain Score  1     Pain Location  Knee    Pain Orientation  Left    Pain Descriptors / Indicators  Aching;Dull    Pain Type  Surgical pain    Pain Onset  More than a month ago    Pain Frequency  Intermittent    Aggravating Factors   pain with weather, bending the knee    Pain Relieving Factors  ice PRN,          [facility] PT Assessment - 10/18/18 0001      Assessment   Medical Diagnosis  Lt TKA    Referring Provider (PT)  Dr [name]    Onset Date/Surgical Date  07/25/18      Observation/Other Assessments   Focus on Therapeutic Outcomes (FOTO)   28% limited      AROM   Left Knee Extension  -5    Left Knee  Flexion  110   121 following manual     PROM   Left Knee Flexion  116      Strength   Left Knee Flexion  5/5    Left Knee Extension  5/5      Palpation   Palpation comment  medial joint line and proximal tibia                   [facility] Adult PT Treatment/Exercise - 10/18/18 0001      Knee/Hip Exercises: Stretches   Active Hamstring Stretch  2 reps;30 seconds;Left      Knee/Hip Exercises: Aerobic   Recumbent Bike  L3 x 6 min    lowering seat at 3 min     Knee/Hip Exercises: Machines for Strengthening   Cybex Knee Flexion  2 x 15 20# bil LE with emphasis on eccentrics      Manual Therapy   Joint Mobilization  supine PA grade III, with pt actively flexed between sets    Passive ROM  L knee flexion combined with R knee active extension for reciprocal inhibtion    as well as knee flexion with towel in popliteal space in sup            PT Education - 10/18/18 1624    Education Details  reviewed HEp and discussed importance of being consistent with HEP and being proactive versus reactive.  Dicusssed updated POC.    Person(s) Educated  Patient    Methods  Explanation;Handout;Verbal cues    Comprehension  Verbalized understanding;Verbal cues required       PT Short Term Goals - 10/18/18 1504      PT SHORT TERM GOAL #1   Title  Patient will increase right knee passive flexion by 25 degrees       PT SHORT TERM GOAL #2   Title  Patient will demsotrate full passive right knee extension    Time  4    Period  Weeks    Status  Achieved      PT SHORT TERM GOAL #3   Title  Patient will increase gross right lower extremity strength to 4+/5     Period  Weeks    Status  Achieved      PT SHORT TERM GOAL #4   Title  Patient will walk 400' without an assistive device with minimal antalgic gait     Time  4    Period  Weeks    Status  Achieved      PT SHORT TERM GOAL #5   Title  Patient wil be independent with inital HEP     Period  Weeks    Status  Achieved        PT Long Term Goals - 10/18/18 1505      PT LONG TERM GOAL #1   Title  Patient will increase gross Lt knee flexion movement to 0-120 degrees without pain in order to get up and down off a low chair. ( 09/28/2018)     Baseline  0-110    Time  6    Period  Weeks    Status  On-going      PT LONG TERM GOAL #2   Title  Patient will ambulate 3000' without increased pain and without an antalgic gait in order to return to work  on even and uneven surfaces ( 09/28/2018)     Time  6    Period  Weeks    Status  Achieved      PT LONG TERM GOAL #3   Title  Patient will be independent with final HEP for LE strength and stability ( 09/28/2018)     Period  Weeks    Status  Achieved      PT LONG TERM GOAL #4   Title  Patient will demsotrate a 44% limitation on FOTO  ( 09/28/2018)     Time  6    Period  Weeks    Status  Achieved      PT LONG TERM GOAL #5   Title  Patient will go up/down 8 steps without increased pain in order to perfrom ADLs and get in/out of houses safely for work ( 09/28/2018)     Period  Weeks     Status  On-going            Plan - 10/18/18 1625    Clinical Impression Statement  pt reports increased soreness today which he attributes to be weather related. He demonstrates mild regression of ROM compared to previous measurement at 110 degrees but with stretching and mobs improved to 121 degrees. End of session he reported feeling looser and no pain. Plan to see pt 1 x a week for 3 weeks to address remaining deficits/ goals and finalize HEP.     PT Frequency  1x / week    PT Duration  3 weeks    PT Treatment/Interventions  Iontophoresis 4mg /ml Dexamethasone;Balance training;Neuromuscular re-education;Scar mobilization;Passive range of motion;Dry needling;Manual techniques;Functional mobility training;Ultrasound;Moist Heat;Joint Manipulations;Patient/family education;Cryotherapy;Electrical Stimulation;Therapeutic exercise;Vasopneumatic Device;Taping    PT Next Visit Plan  continue to progress functional strengthening as tolerated    PT Home Exercise Plan  knee stretching and strengthening exercises     Consulted and Agree with Plan of Care  Patient       Patient will benefit from skilled therapeutic intervention in order to improve the following deficits and impairments:  Difficulty walking, Pain, Decreased scar mobility, Decreased mobility, Decreased range of motion, Decreased strength, Increased edema  Visit Diagnosis: Muscle weakness (generalized)  Acute pain of left knee  Localized edema  Difficulty in walking, not elsewhere classified     Problem List Patient Active Problem List   Diagnosis Date Noted  . Primary osteoarthritis of left knee 07/25/2018  . History of left knee replacement 07/25/2018  . Right foot pain 06/22/2018  . Acute left-sided low back pain with left-sided sciatica 06/22/2018  . Osteoarthritis 11/21/2017  . Hyperlipidemia 11/21/2017  . Low testosterone 09/26/2016  . Healthcare maintenance 09/26/2016  . S/P total knee replacement using cement,  right 06/01/2016  . Sleep apnea 05/19/2016  . Hypertension 05/19/2016   Starr Lake PT, DPT, LAT, ATC  10/18/18  4:30 PM      [facility] [address] [place], [place], [address] Phone: [phone]   Fax:  [phone]  Name: MOUSTAFA MOSSA MRN: [id number] Date of Birth: 11-12-1950

## 2018-10-22 MED FILL — LOSARTAN-HCTZ 100-25 MG TAB: 100-25 | 30 days supply | Qty: 30 | Fill #1

## 2018-10-26 ENCOUNTER — Other Ambulatory Visit: Payer: Self-pay

## 2018-10-26 ENCOUNTER — Ambulatory Visit: Payer: 59 | Admitting: Physical Therapy

## 2018-10-26 ENCOUNTER — Encounter: Payer: Self-pay | Admitting: Physical Therapy

## 2018-10-26 DIAGNOSIS — M6281 Muscle weakness (generalized): Secondary | ICD-10-CM

## 2018-10-26 DIAGNOSIS — R262 Difficulty in walking, not elsewhere classified: Secondary | ICD-10-CM

## 2018-10-26 DIAGNOSIS — M25562 Pain in left knee: Secondary | ICD-10-CM | POA: Diagnosis not present

## 2018-10-26 DIAGNOSIS — R600 Localized edema: Secondary | ICD-10-CM | POA: Diagnosis not present

## 2018-10-26 NOTE — Therapy (Signed)
[place], [place], [address] Phone: [phone]   Fax:  [phone]  Physical Therapy Treatment  Patient Details  Name: James Gallegos MRN: [id number] Date of Birth: 1951/04/21 Referring Provider (PT): Dr [name]   Encounter Date: 10/26/2018  PT End of Session - 10/26/18 0933    Visit Number  10    Number of Visits  12    Date for PT Re-Evaluation  11/15/18    Authorization Type  [employer]    PT Start Time  0931    PT Stop Time  1010    PT Time Calculation (min)  39 min    Activity Tolerance  Patient tolerated treatment well    Behavior During Therapy  [facility] for tasks assessed/performed       Past Medical History:  Diagnosis Date  . Arthritis    back & L knee, R foot - lis franc   . Deep vein thrombosis ([place])    after knee surgery in 2004  . GERD (gastroesophageal reflux disease)    only with pizza   . Hypertension   . Murmur, cardiac    pt. reports its difficult to auscultate   . Sleep apnea    CPAP- last study- 2014    Past Surgical History:  Procedure Laterality Date  . CERVICAL SPINE SURGERY N/A 1990   C5-6 discectomy  . FINGER SURGERY Right 2008   x 3 index finger  . KNEE ARTHROPLASTY    . KNEE ARTHROSCOPY Bilateral    L 2004, R 2005  . MASS EXCISION Right 01/18/2017   Procedure: EXCISION BENIGN RIGHT NECK MASS;  Surgeon: Rozetta Nunnery, MD;  Location: [place];  Service: ENT;  Laterality: Right;  . NASAL FRACTURE SURGERY    . 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 TOTAL KNEE ARTHROPLASTY Right 06/01/2016   Procedure: TOTAL KNEE ARTHROPLASTY;  Surgeon: [name], MD;  Location: [place];  Service: Orthopedics;  Laterality: Right;  . TOTAL KNEE ARTHROPLASTY Left 07/25/2018   Procedure: LEFT TOTAL KNEE ARTHROPLASTY;  Surgeon: [name], MD;  Location: [place];  Service: Orthopedics;  Laterality: Left;    There were no vitals filed for  this visit.      [facility] PT Assessment - 10/26/18 0001      Assessment   Medical Diagnosis  Lt TKA    Referring Provider (PT)  Dr [name]                   [facility] Adult PT Treatment/Exercise - 10/26/18 0001      Knee/Hip Exercises: Aerobic   Recumbent Bike  L4 x 6 min    lowering seat every 2 min to promote flexion     Knee/Hip Exercises: Machines for Strengthening   Cybex Knee Extension  2 x 20 20# bil LE    Cybex Knee Flexion  2 x 20 35# bil LE with emphasis on eccentrics    Cybex Leg Press  2x15 60# bil LE      Knee/Hip Exercises: Standing   Forward Step Up  2 sets;10 reps;Both   Onto BOSU     Knee/Hip Exercises: Seated   Sit to Sand  2 sets;15 reps;without UE support   with green band around the knees            PT Education - 10/26/18 0957    Education Details  updated HEP for continued quad and hamstring  as well as advanced stretching    Person(s) Educated  Patient    Methods  Explanation;Verbal cues;Handout    Comprehension  Verbalized understanding;Verbal cues required       PT Short Term Goals - 10/18/18 1504      PT SHORT TERM GOAL #1   Title  Patient will increase right knee passive flexion by 25 degrees       PT SHORT TERM GOAL #2   Title  Patient will demsotrate full passive right knee extension    Time  4    Period  Weeks    Status  Achieved      PT SHORT TERM GOAL #3   Title  Patient will increase gross right lower extremity strength to 4+/5     Period  Weeks    Status  Achieved      PT SHORT TERM GOAL #4   Title  Patient will walk 400' without an assistive device with minimal antalgic gait     Time  4    Period  Weeks    Status  Achieved      PT SHORT TERM GOAL #5   Title  Patient wil be independent with inital HEP     Period  Weeks    Status  Achieved        PT Long Term Goals - 10/18/18 1505      PT LONG TERM GOAL #1   Title  Patient will increase gross Lt knee flexion movement to 0-120 degrees without  pain in order to get up and down off a low chair. ( 09/28/2018)     Baseline  0-110    Time  6    Period  Weeks    Status  On-going      PT LONG TERM GOAL #2   Title  Patient will ambulate 3000' without increased pain and without an antalgic gait in order to return to work  on even and uneven surfaces ( 09/28/2018)     Time  6    Period  Weeks    Status  Achieved      PT LONG TERM GOAL #3   Title  Patient will be independent with final HEP for LE strength and stability ( 09/28/2018)     Period  Weeks    Status  Achieved      PT LONG TERM GOAL #4   Title  Patient will demsotrate a 44% limitation on FOTO  ( 09/28/2018)     Time  6    Period  Weeks    Status  Achieved      PT LONG TERM GOAL #5   Title  Patient will go up/down 8 steps without increased pain in order to perfrom ADLs and get in/out of houses safely for work ( 09/28/2018)     Period  Weeks    Status  On-going            Plan - 10/26/18 0957    Clinical Impression Statement  Focused today's session on bil LE strengthening and endurance training which he performed well noting fatigue and no report of increased pain or soreness. updated pt's HEP for progressing bil LE strength at home.    PT Next Visit Plan  continue to progress functional strengthening as tolerated    PT Home Exercise Plan  knee stretching and strengthening exercises, quad stretch supine with strap, knee flexion/ extension with band, sit to stand with band    Consulted  and Agree with Plan of Care  Patient       Patient will benefit from skilled therapeutic intervention in order to improve the following deficits and impairments:  Difficulty walking, Pain, Decreased scar mobility, Decreased mobility, Decreased range of motion, Decreased strength, Increased edema  Visit Diagnosis: Muscle weakness (generalized)  Acute pain of left knee  Localized edema  Difficulty in walking, not elsewhere classified     Problem List Patient Active Problem  List   Diagnosis Date Noted  . Primary osteoarthritis of left knee 07/25/2018  . History of left knee replacement 07/25/2018  . Right foot pain 06/22/2018  . Acute left-sided low back pain with left-sided sciatica 06/22/2018  . Osteoarthritis 11/21/2017  . Hyperlipidemia 11/21/2017  . Low testosterone 09/26/2016  . Healthcare maintenance 09/26/2016  . S/P total knee replacement using cement, right 06/01/2016  . Sleep apnea 05/19/2016  . Hypertension 05/19/2016   Starr Lake PT, DPT, LAT, ATC  10/26/18  10:11 AM      [facility] [address] [place], [place], [address] Phone: [phone]   Fax:  [phone]  Name: James Gallegos MRN: [id number] Date of Birth: 10-29-1950

## 2018-11-01 ENCOUNTER — Encounter: Payer: Self-pay | Admitting: Physical Therapy

## 2018-11-01 ENCOUNTER — Other Ambulatory Visit: Payer: Self-pay

## 2018-11-01 ENCOUNTER — Ambulatory Visit: Payer: 59 | Admitting: Physical Therapy

## 2018-11-01 DIAGNOSIS — R600 Localized edema: Secondary | ICD-10-CM

## 2018-11-01 DIAGNOSIS — M6281 Muscle weakness (generalized): Secondary | ICD-10-CM

## 2018-11-01 DIAGNOSIS — R262 Difficulty in walking, not elsewhere classified: Secondary | ICD-10-CM | POA: Diagnosis not present

## 2018-11-01 DIAGNOSIS — M25562 Pain in left knee: Secondary | ICD-10-CM

## 2018-11-01 NOTE — Therapy (Signed)
[place], [place], [address] Phone: [phone]   Fax:  [phone]  Physical Therapy Treatment  Patient Details  Name: James Gallegos MRN: [id number] Date of Birth: 1950/09/09 Referring Provider (PT): Dr [name]   Encounter Date: 11/01/2018  PT End of Session - 11/01/18 1012    Visit Number  11    Number of Visits  12    Date for PT Re-Evaluation  11/15/18    PT Start Time  0937    PT Stop Time  1015    PT Time Calculation (min)  38 min    Activity Tolerance  Patient tolerated treatment well    Behavior During Therapy  [facility] for tasks assessed/performed       Past Medical History:  Diagnosis Date  . Arthritis    back & L knee, R foot - lis franc   . Deep vein thrombosis ([place])    after knee surgery in 2004  . GERD (gastroesophageal reflux disease)    only with pizza   . Hypertension   . Murmur, cardiac    pt. reports its difficult to auscultate   . Sleep apnea    CPAP- last study- 2014    Past Surgical History:  Procedure Laterality Date  . CERVICAL SPINE SURGERY N/A 1990   C5-6 discectomy  . FINGER SURGERY Right 2008   x 3 index finger  . KNEE ARTHROPLASTY    . KNEE ARTHROSCOPY Bilateral    L 2004, R 2005  . MASS EXCISION Right 01/18/2017   Procedure: EXCISION BENIGN RIGHT NECK MASS;  Surgeon: Rozetta Nunnery, MD;  Location: [place];  Service: ENT;  Laterality: Right;  . NASAL FRACTURE SURGERY    . 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 TOTAL KNEE ARTHROPLASTY Right 06/01/2016   Procedure: TOTAL KNEE ARTHROPLASTY;  Surgeon: [name], MD;  Location: [place];  Service: Orthopedics;  Laterality: Right;  . TOTAL KNEE ARTHROPLASTY Left 07/25/2018   Procedure: LEFT TOTAL KNEE ARTHROPLASTY;  Surgeon: [name], MD;  Location: [place];  Service: Orthopedics;  Laterality: Left;    There were no vitals filed for this visit.  Subjective  Assessment - 11/01/18 0941    Subjective  "I had alot soreness yesterday which could be due to doing alot more at home over the weekend"    Patient Stated Goals  less pain in the knee and flex 120 degrees.     Currently in Pain?  No/denies    Pain Orientation  Left    Pain Onset  More than a month ago    Pain Frequency  Intermittent    Aggravating Factors   doing too much         [facility] PT Assessment - 11/01/18 0001      Assessment   Medical Diagnosis  Lt TKA    Referring Provider (PT)  Dr [name]                   [facility] Adult PT Treatment/Exercise - 11/01/18 0001      Knee/Hip Exercises: Stretches   Active Hamstring Stretch  2 reps;30 seconds    Quad Stretch  2 reps;Left;30 seconds   with strap     Knee/Hip Exercises: Aerobic   Recumbent Bike  L5 x 5 min      Knee/Hip Exercises: Machines for Strengthening   Cybex Knee Extension  1 x 20 25# bil LE with  eccentric loading    Cybex Knee Flexion  1 x 20 35@ with eccentric loading      Knee/Hip Exercises: Standing   Heel Raises  1 set;20 reps   off edge of a step   Hip Abduction  2 sets;15 reps;Knee straight   green theraband   Hip Extension  2 sets;15 reps;Knee straight;Both;Stengthening    Forward Step Up  1 set;20 reps;Step Height: 6"    Step Down  1 set;20 reps;Step Height: 6"               PT Short Term Goals - 10/18/18 1504      PT SHORT TERM GOAL #1   Title  Patient will increase right knee passive flexion by 25 degrees       PT SHORT TERM GOAL #2   Title  Patient will demsotrate full passive right knee extension    Time  4    Period  Weeks    Status  Achieved      PT SHORT TERM GOAL #3   Title  Patient will increase gross right lower extremity strength to 4+/5     Period  Weeks    Status  Achieved      PT SHORT TERM GOAL #4   Title  Patient will walk 400' without an assistive device with minimal antalgic gait     Time  4    Period  Weeks    Status  Achieved      PT SHORT  TERM GOAL #5   Title  Patient wil be independent with inital HEP     Period  Weeks    Status  Achieved        PT Long Term Goals - 10/18/18 1505      PT LONG TERM GOAL #1   Title  Patient will increase gross Lt knee flexion movement to 0-120 degrees without pain in order to get up and down off a low chair. ( 09/28/2018)     Baseline  0-110    Time  6    Period  Weeks    Status  On-going      PT LONG TERM GOAL #2   Title  Patient will ambulate 3000' without increased pain and without an antalgic gait in order to return to work  on even and uneven surfaces ( 09/28/2018)     Time  6    Period  Weeks    Status  Achieved      PT LONG TERM GOAL #3   Title  Patient will be independent with final HEP for LE strength and stability ( 09/28/2018)     Period  Weeks    Status  Achieved      PT LONG TERM GOAL #4   Title  Patient will demsotrate a 44% limitation on FOTO  ( 09/28/2018)     Time  6    Period  Weeks    Status  Achieved      PT LONG TERM GOAL #5   Title  Patient will go up/down 8 steps without increased pain in order to perfrom ADLs and get in/out of houses safely for work ( 09/28/2018)     Period  Weeks    Status  On-going            Plan - 11/01/18 1002    Clinical Impression Statement  worked on functional strengthening and endurance as it would related to his ADL's. he did well with  all exercises reporting fatigue with hip strengthening. plan to pt for one more visit to finalize HEP.     PT Treatment/Interventions  Iontophoresis 4mg /ml Dexamethasone;Balance training;Neuromuscular re-education;Scar mobilization;Passive range of motion;Dry needling;Manual techniques;Functional mobility training;Ultrasound;Moist Heat;Joint Manipulations;Patient/family education;Cryotherapy;Electrical Stimulation;Therapeutic exercise;Vasopneumatic Device;Taping    PT Next Visit Plan  continue to progress functional strengthening as tolerated    PT Home Exercise Plan  knee stretching and  strengthening exercises, quad stretch supine with strap, knee flexion/ extension with band, sit to stand with band       Patient will benefit from skilled therapeutic intervention in order to improve the following deficits and impairments:  Difficulty walking, Pain, Decreased scar mobility, Decreased mobility, Decreased range of motion, Decreased strength, Increased edema  Visit Diagnosis: Muscle weakness (generalized)  Acute pain of left knee  Localized edema  Difficulty in walking, not elsewhere classified     Problem List Patient Active Problem List   Diagnosis Date Noted  . Primary osteoarthritis of left knee 07/25/2018  . History of left knee replacement 07/25/2018  . Right foot pain 06/22/2018  . Acute left-sided low back pain with left-sided sciatica 06/22/2018  . Osteoarthritis 11/21/2017  . Hyperlipidemia 11/21/2017  . Low testosterone 09/26/2016  . Healthcare maintenance 09/26/2016  . S/P total knee replacement using cement, right 06/01/2016  . Sleep apnea 05/19/2016  . Hypertension 05/19/2016    Starr Lake PT, DPT, LAT, ATC  11/01/18  10:14 AM      [facility] [address] [place], [place], [address] Phone: [phone]   Fax:  [phone]  Name: GIANLUCAS EVENSON MRN: [id number] Date of Birth: [birth year]

## 2018-11-07 MED FILL — AMOXICILLIN 500 MG CAPSULE: 500 | 3 days supply | Qty: 12 | Fill #2

## 2018-11-08 ENCOUNTER — Encounter: Payer: Self-pay | Admitting: Physical Therapy

## 2018-11-08 ENCOUNTER — Other Ambulatory Visit: Payer: Self-pay

## 2018-11-08 ENCOUNTER — Ambulatory Visit: Payer: 59 | Admitting: Physical Therapy

## 2018-11-08 DIAGNOSIS — R600 Localized edema: Secondary | ICD-10-CM | POA: Diagnosis not present

## 2018-11-08 DIAGNOSIS — M6281 Muscle weakness (generalized): Secondary | ICD-10-CM | POA: Diagnosis not present

## 2018-11-08 DIAGNOSIS — R262 Difficulty in walking, not elsewhere classified: Secondary | ICD-10-CM

## 2018-11-08 DIAGNOSIS — M25562 Pain in left knee: Secondary | ICD-10-CM | POA: Diagnosis not present

## 2018-11-08 NOTE — Therapy (Signed)
James Gallegos, [place], [address] Phone: [phone]   Fax:  [phone]  Physical Therapy Treatment / discharge summary  Patient Details  Name: James Gallegos MRN: [id number] Date of Birth: [birth year] Referring Provider (PT): Dr [name]   Encounter Date: 11/08/2018  PT End of Session - 11/08/18 1022    Visit Number  12    Number of Visits  12    Date for PT Re-Evaluation  11/15/18    Authorization Type  [place] [employer]    PT Start Time  1016    PT Stop Time  1054    PT Time Calculation (min)  38 min    Activity Tolerance  Patient tolerated treatment well    Behavior During Therapy  [facility] for tasks assessed/performed       Past Medical History:  Diagnosis Date  . Arthritis    back & L knee, R foot - lis franc   . Deep vein thrombosis ([place])    after knee surgery in 2004  . GERD (gastroesophageal reflux disease)    only with pizza   . Hypertension   . Murmur, cardiac    pt. reports its difficult to auscultate   . Sleep apnea    CPAP- last study- 2014    Past Surgical History:  Procedure Laterality Date  . CERVICAL SPINE SURGERY N/A 1990   C5-6 discectomy  . FINGER SURGERY Right 2008   x 3 index finger  . KNEE ARTHROPLASTY    . KNEE ARTHROSCOPY Bilateral    L 2004, R 2005  . MASS EXCISION Right 01/18/2017   Procedure: EXCISION BENIGN RIGHT NECK MASS;  Surgeon: Rozetta Nunnery, MD;  Location: [place];  Service: ENT;  Laterality: Right;  . NASAL FRACTURE SURGERY    . 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 TOTAL KNEE ARTHROPLASTY Right 06/01/2016   Procedure: TOTAL KNEE ARTHROPLASTY;  Surgeon: [name], MD;  Location: [place];  Service: Orthopedics;  Laterality: Right;  . TOTAL KNEE ARTHROPLASTY Left 07/25/2018   Procedure: LEFT TOTAL KNEE ARTHROPLASTY;  Surgeon: [name], MD;  Location: [place];  Service: Orthopedics;  Laterality: Left;    There were  no vitals filed for this visit.  Subjective Assessment - 11/08/18 1020    Subjective  "My foot is the thing that seems to be bothering me, and the weather"    Currently in Pain?  Yes    Pain Score  0-No pain    Pain Location  Knee    Pain Orientation  Left    Pain Descriptors / Indicators  --   stiffness   Aggravating Factors   weather         [facility] PT Assessment - 11/08/18 0001      Assessment   Medical Diagnosis  Lt TKA    Referring Provider (PT)  Dr [name]      Observation/Other Assessments   Focus on Therapeutic Outcomes (FOTO)   24% limited      AROM   Left Knee Extension  -5    Left Knee Flexion  116      Strength   Left Knee Flexion  5/5    Left Knee Extension  5/5                   [facility] Adult PT Treatment/Exercise - 11/08/18 0001      Knee/Hip Exercises: Stretches   Active Hamstring Stretch  2 reps;30 seconds    Quad Stretch  2 reps;Left;30 seconds   with strap     Knee/Hip Exercises: Aerobic   Recumbent Bike  L5 x 5 min   lowering seat at 2 min     Knee/Hip Exercises: Standing   Stairs  up/ down 6 inch steps reciprocally. cues to avoid step to pattern if possible, which if he returns to old habits to stop and restart with proper form             PT Education - 11/08/18 1028    Education Details  reviewed previously provided HEp, importance of being consistent with exercises. benefits of stretching, how to progress strengthening to promote function    Person(s) Educated  Patient    Methods  Explanation;Verbal cues;Handout    Comprehension  Verbalized understanding;Verbal cues required       PT Short Term Goals - 11/08/18 1035      PT SHORT TERM GOAL #1   Title  Patient will increase right knee passive flexion by 25 degrees     Period  Weeks    Status  Achieved        PT Long Term Goals - 11/08/18 1035      PT LONG TERM GOAL #1   Title  Patient will increase gross Lt knee flexion movement to 0-120 degrees without  pain in order to get up and down off a low chair. ( 09/28/2018)     Baseline  5 - 116 (fluctuates based weather and activity)    Period  Weeks    Status  Partially Met      PT LONG TERM GOAL #2   Title  Patient will ambulate 3000' without increased pain and without an antalgic gait in order to return to work  on even and uneven surfaces ( 09/28/2018)     Period  Weeks    Status  Achieved      PT LONG TERM GOAL #3   Title  Patient will be independent with final HEP for LE strength and stability ( 09/28/2018)     Period  Weeks    Status  Achieved      PT LONG TERM GOAL #4   Title  Patient will demsotrate a 44% limitation on FOTO  ( 09/28/2018)     Baseline  24% limited    Period  Weeks    Status  Achieved      PT LONG TERM GOAL #5   Title  Patient will go up/down 8 steps without increased pain in order to perfrom ADLs and get in/out of houses safely for work ( 09/28/2018)     Time  6    Period  Weeks    Status  Achieved            Plan - 11/08/18 1032    Clinical Impression Statement  [name] has made great progress with physical therapy increasing ROM and strength. He does report intermittent stiffness that is relieved with stretching and exercise. He met or partially met all goals today. pt is able to maintain and progress his current level of function and will be discharged from PT today.     PT Treatment/Interventions  Iontophoresis '4mg'$ /ml Dexamethasone;Balance training;Neuromuscular re-education;Scar mobilization;Passive range of motion;Dry needling;Manual techniques;Functional mobility training;Ultrasound;Moist Heat;Joint Manipulations;Patient/family education;Cryotherapy;Electrical Stimulation;Therapeutic exercise;Vasopneumatic Device;Taping    PT Next Visit Plan  continue to progress functional strengthening as tolerated    PT Home Exercise Plan  knee stretching and strengthening exercises,  quad stretch supine with strap, knee flexion/ extension with band, sit to stand with  band    Consulted and Agree with Plan of Care  Patient       Patient will benefit from skilled therapeutic intervention in order to improve the following deficits and impairments:     Visit Diagnosis: Muscle weakness (generalized)  Acute pain of left knee  Localized edema  Difficulty in walking, not elsewhere classified     Problem List Patient Active Problem List   Diagnosis Date Noted  . Primary osteoarthritis of left knee 07/25/2018  . History of left knee replacement 07/25/2018  . Right foot pain 06/22/2018  . Acute left-sided low back pain with left-sided sciatica 06/22/2018  . Osteoarthritis 11/21/2017  . Hyperlipidemia 11/21/2017  . Low testosterone 09/26/2016  . Healthcare maintenance 09/26/2016  . S/P total knee replacement using cement, right 06/01/2016  . Sleep apnea 05/19/2016  . Hypertension 05/19/2016    [place] 11/08/2018, 10:52 AM  [facility] [address] [place], [place], [address] Phone: [phone]   Fax:  [phone]  Name: James Gallegos MRN: [id number] Date of Birth: 1950/10/08        PHYSICAL THERAPY DISCHARGE SUMMARY  Visits from Start of Care: 13  Current functional level related to goals / functional outcomes: See goals, FOTO 24% limited   Remaining deficits: Intermittent stiffness that is relieved with stretching and exercise.    Education / Equipment: HEP, theraband, posture and lifting mechanics,   Plan: Patient agrees to discharge.  Patient goals were partially met. Patient is being discharged due to being pleased with the current functional level.  ?????         Adelise Buswell PT, DPT, LAT, ATC  11/08/18  10:53 AM

## 2018-11-23 MED FILL — LOSARTAN-HCTZ 100-25 MG TAB: 100-25 | 30 days supply | Qty: 30 | Fill #0

## 2018-12-06 ENCOUNTER — Other Ambulatory Visit: Payer: Self-pay

## 2018-12-06 ENCOUNTER — Ambulatory Visit (INDEPENDENT_AMBULATORY_CARE_PROVIDER_SITE_OTHER): Payer: 59 | Admitting: Orthopaedic Surgery

## 2018-12-06 ENCOUNTER — Encounter: Payer: Self-pay | Admitting: Orthopaedic Surgery

## 2018-12-06 ENCOUNTER — Ambulatory Visit (INDEPENDENT_AMBULATORY_CARE_PROVIDER_SITE_OTHER): Payer: PPO

## 2018-12-06 VITALS — BP 152/60 | HR 69 | Ht 69.0 in | Wt 213.0 lb

## 2018-12-06 DIAGNOSIS — M25521 Pain in right elbow: Secondary | ICD-10-CM

## 2018-12-06 NOTE — Progress Notes (Signed)
Office Visit Note   Patient: James Gallegos           Date of Birth: 28-Oct-1950           MRN: [id number] Visit Date: 12/06/2018              Requested by: Aldine Contes, MD [address], [place],  [place] [address] PCP: Aldine Contes, MD   Assessment & Plan: Visit Diagnoses:  1. Pain in right elbow     Plan: [name] has had a problem with his right elbow since before his left total knee replacement in February.  He had a fall and has had some discomfort since that time.  He has clinical evidence of mild tennis elbow but films demonstrate degenerative changes within the elbow.  Will suggest exercises, Voltaren gel and tennis elbow strap.  We will also give him exercises for strengthening.  Office visit over 30 minutes 50% of the time in counseling  Follow-Up Instructions: Return if symptoms worsen or fail to improve.   Orders:  Orders Placed This Encounter  Procedures  . XR Elbow 2 Views Right   No orders of the defined types were placed in this encounter.     Procedures: No procedures performed   Clinical Data: No additional findings.   Subjective: Chief Complaint  Patient presents with  . Right Elbow - Pain  Patient presents today for his right elbow. Patient states that he fell down some steps in January. He said that he has had pain on and off in his right elbow since. The pain radiates from his elbow into his distal arm. He said that he has noticed weakness in his arm when he tries to pick anything up. No numbness or tingling in his upper extremity. He does not take anything for pain. He has a history of neck surgery.  Has had occasional pain particularly when he grabs heavy object.  Pain is localized to the elbow and forearm.  HPI  Review of Systems   Objective: Vital Signs: BP (!) 152/60   Pulse 69   Ht 5\' 9"  (1.753 m)   Wt 213 lb (96.6 kg)   BMI 31.45 kg/m   Physical Exam Constitutional:      Appearance: He is  well-developed.  Eyes:     Pupils: Pupils are equal, round, and reactive to light.  Pulmonary:     Effort: Pulmonary effort is normal.  Skin:    General: Skin is warm and dry.  Neurological:     Mental Status: He is alert and oriented to person, place, and time.  Psychiatric:        Behavior: Behavior normal.     Ortho Exam right elbow lacks about 15 degrees to full extension compared to his left elbow.  Some mild pain in the extreme of pronation and supination.  Local tenderness over the lateral epicondyle.  Good grip and good release.  No edema.  No masses.  Neurovascular intact  Specialty Comments:  No specialty comments available.  Imaging: Xr Elbow 2 Views Right  Result Date: 12/06/2018 Films of the right elbow obtained in 2 projections.  There is no evidence of a fat pad sign or effusion.  No obvious fracture.  There are some osteophytes identified about the olecranon consistent with osteoarthritis    PMFS History: Patient Active Problem List   Diagnosis Date Noted  . Pain in right elbow 12/06/2018  . Primary osteoarthritis of left knee 07/25/2018  .  History of left knee replacement 07/25/2018  . Right foot pain 06/22/2018  . Acute left-sided low back pain with left-sided sciatica 06/22/2018  . Osteoarthritis 11/21/2017  . Hyperlipidemia 11/21/2017  . Low testosterone 09/26/2016  . Healthcare maintenance 09/26/2016  . S/P total knee replacement using cement, right 06/01/2016  . Sleep apnea 05/19/2016  . Hypertension 05/19/2016   Past Medical History:  Diagnosis Date  . Arthritis    back & L knee, R foot - lis franc   . Deep vein thrombosis ([place])    after knee surgery in 2004  . GERD (gastroesophageal reflux disease)    only with pizza   . Hypertension   . Murmur, cardiac    pt. reports its difficult to auscultate   . Sleep apnea    CPAP- last study- 2014    Family History  Problem Relation Age of Onset  . Alzheimer's disease Father   . Colon cancer Neg  Hx   . Esophageal cancer Neg Hx   . Rectal cancer Neg Hx   . Stomach cancer Neg Hx     Past Surgical History:  Procedure Laterality Date  . CERVICAL SPINE SURGERY N/A 1990   C5-6 discectomy  . FINGER SURGERY Right 2008   x 3 index finger  . KNEE ARTHROPLASTY    . KNEE ARTHROSCOPY Bilateral    L 2004, R 2005  . MASS EXCISION Right 01/18/2017   Procedure: EXCISION BENIGN RIGHT NECK MASS;  Surgeon: Rozetta Nunnery, MD;  Location: [place];  Service: ENT;  Laterality: Right;  . NASAL FRACTURE SURGERY    . 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 TOTAL KNEE ARTHROPLASTY Right 06/01/2016   Procedure: TOTAL KNEE ARTHROPLASTY;  Surgeon: [name], MD;  Location: [place];  Service: Orthopedics;  Laterality: Right;  . TOTAL KNEE ARTHROPLASTY Left 07/25/2018   Procedure: LEFT TOTAL KNEE ARTHROPLASTY;  Surgeon: [name], MD;  Location: [place];  Service: Orthopedics;  Laterality: Left;   Social History   Occupational History  . Not on file  Tobacco Use  . Smoking status: Never Smoker  . Smokeless tobacco: Never Used  Substance and Sexual Activity  . Alcohol use: Yes    Comment: rarely  . Drug use: No  . Sexual activity: Not on file

## 2018-12-06 NOTE — Patient Instructions (Signed)
Tennis Elbow Rehab  Ask your health care provider which exercises are safe for you. Do exercises exactly as told by your health care provider and adjust them as directed. It is normal to feel mild stretching, pulling, tightness, or discomfort as you do these exercises, but you should stop right away if you feel sudden pain or your pain gets worse. Do not begin these exercises until told by your health care provider.  Stretching and range of motion exercises  These exercises warm up your muscles and joints and improve the movement and flexibility of your elbow. These exercises also help to relieve pain, numbness, and tingling.  Exercise A: Wrist extensor stretch  1. Extend your left / right elbow with your fingers pointing down.  2. Gently pull the palm of your left / right hand toward you until you feel a gentle stretch on the top of your forearm.  3. To increase the stretch, push your left / right hand toward the outer edge or pinkie side of your forearm.  4. Hold this position for __________ seconds.  Repeat __________ times. Complete this exercise __________ times a day.  If directed by your health care provider, repeat this stretch except do it with a bent elbow this time.  Exercise B: Wrist flexor stretch    1. Extend your left / right elbow and turn your palm upward.  2. Gently pull your left / right palm and fingertips back so your wrist extends and your fingers point more toward the ground.  3. You should feel a gentle stretch on the inside of your forearm.  4. Hold this position for __________ seconds.  Repeat __________ times. Complete this exercise __________ times a day.  If directed by your health care provider, repeat this stretch except do it with a bent elbow this time.  Strengthening exercises  These exercises build strength and endurance in your elbow. Endurance is the ability to use your muscles for a long time, even after they get tired.  Exercise C: Wrist extensors    1. Sit with your left /  right forearm palm-down and fully supported on a table or countertop. Your elbow should be resting below the height of your shoulder.  2. Let your left / right wrist extend over the edge of the surface.  3. Loosely hold a __________ weight or a piece of rubber exercise band or tubing in your left / right hand. Slowly curl your left / right hand up toward your forearm. If you are using band or tubing, hold the band or tubing in place with your other hand to provide resistance.  4. Hold this position for __________ seconds.  5. Slowly return to the starting position.  Repeat __________ times. Complete this exercise __________ times a day.  Exercise D: Radial deviators    1. Stand with a __________ weight in your left / righthand. Or, sit while holding a rubber exercise band or tubing with your other arm supported on a table or countertop. Position your hand so your thumb is on top.  2. Raise your hand upward in front of you so your thumb travels toward your forearm, or pull up on the rubber tubing.  3. Hold this position for __________ seconds.  4. Slowly return to the starting position.  Repeat __________ times. Complete this exercise __________ times a day.  Exercise E: Eccentric wrist extensors  1. Sit with your left / right forearm palm-down and fully supported on a table or countertop. Your   elbow should be resting below the height of your shoulder.  2. If told by your health care provider, hold a __________ weight in your hand.  3. Let your left / right wrist extend over the edge of the surface.  4. Use your other hand to lift up your left / right hand toward your forearm. Keep your forearm on the table.  5. Using only the muscles in your left / right hand, slowly lower your hand back down to the starting position.  Repeat __________ times. Complete this exercise __________ times a day.  This information is not intended to replace advice given to you by your health care provider. Make sure you discuss any  questions you have with your health care provider.  Document Released: 05/31/2005 Document Revised: 02/04/2016 Document Reviewed: 02/27/2015  Elsevier Interactive Patient Education  2019 Elsevier Inc[employer]

## 2018-12-25 MED FILL — LOSARTAN-HCTZ 100-25 MG TAB: 100-25 | 90 days supply | Qty: 90 | Fill #1

## 2019-01-08 ENCOUNTER — Ambulatory Visit (INDEPENDENT_AMBULATORY_CARE_PROVIDER_SITE_OTHER): Payer: 59 | Admitting: Family Medicine

## 2019-01-08 ENCOUNTER — Other Ambulatory Visit: Payer: Self-pay

## 2019-01-08 ENCOUNTER — Telehealth: Payer: Self-pay | Admitting: Family Medicine

## 2019-01-08 ENCOUNTER — Encounter: Payer: Self-pay | Admitting: Family Medicine

## 2019-01-08 VITALS — BP 126/60 | HR 58 | Temp 98.3°F | Resp 16 | Ht 67.5 in | Wt 226.5 lb

## 2019-01-08 DIAGNOSIS — G4733 Obstructive sleep apnea (adult) (pediatric): Secondary | ICD-10-CM

## 2019-01-08 DIAGNOSIS — E785 Hyperlipidemia, unspecified: Secondary | ICD-10-CM | POA: Diagnosis not present

## 2019-01-08 DIAGNOSIS — E6609 Other obesity due to excess calories: Secondary | ICD-10-CM | POA: Diagnosis not present

## 2019-01-08 DIAGNOSIS — Z0000 Encounter for general adult medical examination without abnormal findings: Secondary | ICD-10-CM

## 2019-01-08 DIAGNOSIS — Z6839 Body mass index (BMI) 39.0-39.9, adult: Secondary | ICD-10-CM | POA: Diagnosis not present

## 2019-01-08 DIAGNOSIS — I10 Essential (primary) hypertension: Secondary | ICD-10-CM | POA: Diagnosis not present

## 2019-01-08 DIAGNOSIS — R7989 Other specified abnormal findings of blood chemistry: Secondary | ICD-10-CM | POA: Diagnosis not present

## 2019-01-08 NOTE — Telephone Encounter (Signed)
I did mistype his weight. It was actually 226.5 lbs. I made an addendum to the chart and the BMI was adjusted, but I thought you might want to amend your dictation as well. I called and apologized to the patient.

## 2019-01-08 NOTE — Progress Notes (Signed)
Office Visit Note   Patient: James Gallegos           Date of Birth: [birth year]           MRN: [id number] Visit Date: 01/08/2019 Requested by: Aldine Contes, MD [address], [place],  [place] [address] PCP: Aldine Contes, MD  Subjective: Chief Complaint  Patient presents with  . Annual Exam    HPI: He is here to establish care, and for a wellness examination.  He has a history of hypertension well controlled with Hyzaar.  History of testosterone deficiency successfully treated in the past with topical testosterone.  He has been off this for about 4 years because he had a falling out with his previous provider.  He is interested in trying it again if indicated.  Management hyperlipidemia with intermittent fasting.  He is on CPAP for obstructive sleep apnea.  He has a remote history of DVT after a knee scope.  No history of clotting since then.  Immunizations are up-to-date.  Colonoscopy 2018, benign polyps.  Eye exams yearly, dental exams every 6 months.                ROS: Denies fevers or chills, unintentional weight change.  All other systems were reviewed and are negative.  Objective: Vital Signs: BP 126/60 (BP Location: Left Arm, Patient Position: Sitting, Cuff Size: Large)   Pulse (!) 58   Temp 98.3 F (36.8 C)   Resp 16   Ht 5' 7.5" (1.715 m)   Wt 256 lb 8 oz (116.3 kg)   SpO2 98%   BMI 39.58 kg/m   Physical Exam:  General:  Alert and oriented, in no acute distress. Pulm:  Breathing unlabored. Psy:  Normal mood, congruent affect. Skin: Benign-appearing seborrheic keratosis on the right side of his face.:  Very dark nevus lower abdomen and another irregular nevus lower back.  Multiple other benign-appearing skin lesions. HEENT:  [place]/AT, PERRLA, EOM Full, no nystagmus.  Funduscopic examination within normal limits.  No conjunctival erythema.  Tympanic membranes are pearly gray with normal landmarks.  External ear canals are normal.   Nasal passages are clear.  Oropharynx is clear.  No significant lymphadenopathy.  No thyromegaly or nodules.  2+ carotid pulses without bruits. CV: Regular rate and rhythm without murmurs, rubs, or gallops.  No peripheral edema.  2+ radial and posterior tibial pulses. Lungs: Clear to auscultation throughout with no wheezing or areas of consolidation. Abd: Bowel sounds are active, no hepatosplenomegaly or masses.  Soft and nontender.  No audible bruits.  No evidence of ascites.    Imaging: None today.  Assessment & Plan: 1.  Wellness examination -Fasting labs in the near future.  Follow-up in 1 year.  2.  Hypertension, well controlled. -Call for refills when needed.  3.  Low testosterone -Recheck levels today, patient wants to try injectable testosterone if indicated.  We will then recheck labs in about 3 to 4 months.  4.  Obesity - Patient plans to work on this by eliminating soft drinks and trying intermittent fasting.  5.  Hyperlipidemia - Lifestyle changes to manage this.  6.  Obstructive sleep apnea, well controlled with CPAP.        Procedures: No procedures performed  No notes on file     PMFS History: Patient Active Problem List   Diagnosis Date Noted  . Class 2 obesity due to excess calories without serious comorbidity with body mass index (BMI) of 39.0 to  39.9 in adult 01/08/2019  . Pain in right elbow 12/06/2018  . Primary osteoarthritis of left knee 07/25/2018  . History of left knee replacement 07/25/2018  . Right foot pain 06/22/2018  . Acute left-sided low back pain with left-sided sciatica 06/22/2018  . Osteoarthritis 11/21/2017  . Hyperlipidemia 11/21/2017  . Low testosterone 09/26/2016  . Healthcare maintenance 09/26/2016  . S/P total knee replacement using cement, right 06/01/2016  . Sleep apnea 05/19/2016  . Hypertension 05/19/2016   Past Medical History:  Diagnosis Date  . Arthritis    back & L knee, R foot - lis franc   . Deep vein  thrombosis ([place])    after knee surgery in 2004  . GERD (gastroesophageal reflux disease)    only with pizza   . Hypertension   . Murmur, cardiac    pt. reports its difficult to auscultate   . Sleep apnea    CPAP- last study- 2014    Family History  Problem Relation Age of Onset  . Alzheimer's disease Father   . Colon cancer Neg Hx   . Esophageal cancer Neg Hx   . Rectal cancer Neg Hx   . Stomach cancer Neg Hx     Past Surgical History:  Procedure Laterality Date  . CERVICAL SPINE SURGERY N/A 1990   C5-6 discectomy  . FINGER SURGERY Right 2008   x 3 index finger  . KNEE ARTHROPLASTY    . KNEE ARTHROSCOPY Bilateral    L 2004, R 2005  . MASS EXCISION Right 01/18/2017   Procedure: EXCISION BENIGN RIGHT NECK MASS;  Surgeon: Rozetta Nunnery, MD;  Location: [place];  Service: ENT;  Laterality: Right;  . NASAL FRACTURE SURGERY    . 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 TOTAL KNEE ARTHROPLASTY Right 06/01/2016   Procedure: TOTAL KNEE ARTHROPLASTY;  Surgeon: [name], MD;  Location: [place];  Service: Orthopedics;  Laterality: Right;  . TOTAL KNEE ARTHROPLASTY Left 07/25/2018   Procedure: LEFT TOTAL KNEE ARTHROPLASTY;  Surgeon: [name], MD;  Location: [place];  Service: Orthopedics;  Laterality: Left;   Social History   Occupational History  . Not on file  Tobacco Use  . Smoking status: Never Smoker  . Smokeless tobacco: Never Used  Substance and Sexual Activity  . Alcohol use: Yes    Comment: rarely  . Drug use: No  . Sexual activity: Not on file

## 2019-01-08 NOTE — Telephone Encounter (Signed)
Patient called asked for a call back concerning his weight that was listed on the AVS that was printed for him. Patient said his weight is incorrect. The number to contact patient is [phone]

## 2019-01-08 NOTE — Progress Notes (Signed)
Weight was initially incorrect but chart was updated accordingly.

## 2019-01-24 DIAGNOSIS — D170 Benign lipomatous neoplasm of skin and subcutaneous tissue of head, face and neck: Secondary | ICD-10-CM | POA: Diagnosis not present

## 2019-01-25 ENCOUNTER — Other Ambulatory Visit: Payer: Self-pay

## 2019-01-25 ENCOUNTER — Ambulatory Visit (INDEPENDENT_AMBULATORY_CARE_PROVIDER_SITE_OTHER): Payer: PPO

## 2019-01-25 DIAGNOSIS — R7989 Other specified abnormal findings of blood chemistry: Secondary | ICD-10-CM

## 2019-01-25 DIAGNOSIS — I10 Essential (primary) hypertension: Secondary | ICD-10-CM

## 2019-01-25 DIAGNOSIS — Z0000 Encounter for general adult medical examination without abnormal findings: Secondary | ICD-10-CM

## 2019-01-25 DIAGNOSIS — E785 Hyperlipidemia, unspecified: Secondary | ICD-10-CM | POA: Diagnosis not present

## 2019-01-25 DIAGNOSIS — E6609 Other obesity due to excess calories: Secondary | ICD-10-CM

## 2019-01-25 DIAGNOSIS — G4733 Obstructive sleep apnea (adult) (pediatric): Secondary | ICD-10-CM

## 2019-01-25 NOTE — Progress Notes (Signed)
Fasting labwork drawn: thyroid panel + TSH, testosterone (total, free, bio, male), PSA, lipid panel, high sensitivity CRP, CMET, & CBC w/diff.

## 2019-01-25 NOTE — Progress Notes (Signed)
error 

## 2019-01-26 LAB — TESTOSTERONE TOTAL,FREE,BIO, MALES
Albumin: 4.5 g/dL (ref 3.6–5.1)
Sex Hormone Binding: 17 nmol/L — ABNORMAL LOW (ref 22–77)
Testosterone: 170 ng/dL — ABNORMAL LOW (ref 250–827)

## 2019-01-26 LAB — CBC WITH DIFFERENTIAL/PLATELET
Absolute Monocytes: 473 cells/uL (ref 200–950)
Basophils Absolute: 51 cells/uL (ref 0–200)
Basophils Relative: 0.9 %
Eosinophils Absolute: 154 cells/uL (ref 15–500)
Eosinophils Relative: 2.7 %
HCT: 42.5 % (ref 38.5–50.0)
Hemoglobin: 14.8 g/dL (ref 13.2–17.1)
Lymphs Abs: 1602 cells/uL (ref 850–3900)
MCH: 33 pg (ref 27.0–33.0)
MCHC: 34.8 g/dL (ref 32.0–36.0)
MCV: 94.7 fL (ref 80.0–100.0)
MPV: 10.3 fL (ref 7.5–12.5)
Monocytes Relative: 8.3 %
Neutro Abs: 3420 cells/uL (ref 1500–7800)
Neutrophils Relative %: 60 %
Platelets: 197 10*3/uL (ref 140–400)
RBC: 4.49 10*6/uL (ref 4.20–5.80)
RDW: 13.2 % (ref 11.0–15.0)
Total Lymphocyte: 28.1 %
WBC: 5.7 10*3/uL (ref 3.8–10.8)

## 2019-01-26 LAB — PSA: PSA: 1.1 ng/mL (ref <=4.0)

## 2019-01-26 LAB — THYROID PANEL WITH TSH
Free Thyroxine Index: 3.2 (ref 1.4–3.8)
T3 Uptake: 33 % (ref 22–35)
T4, Total: 9.8 ug/dL (ref 4.9–10.5)
TSH: 1.35 mIU/L (ref 0.40–4.50)

## 2019-01-26 LAB — COMPREHENSIVE METABOLIC PANEL
AG Ratio: 1.6 (calc) (ref 1.0–2.5)
ALT: 33 U/L (ref 9–46)
AST: 23 U/L (ref 10–35)
Albumin: 4.5 g/dL (ref 3.6–5.1)
Alkaline phosphatase (APISO): 60 U/L (ref 35–144)
BUN: 24 mg/dL (ref 7–25)
CO2: 23 mmol/L (ref 20–32)
Calcium: 9.5 mg/dL (ref 8.6–10.3)
Chloride: 102 mmol/L (ref 98–110)
Creat: 1.02 mg/dL (ref 0.70–1.25)
Globulin: 2.9 g/dL (calc) (ref 1.9–3.7)
Glucose, Bld: 143 mg/dL — ABNORMAL HIGH (ref 65–99)
Potassium: 4.1 mmol/L (ref 3.5–5.3)
Sodium: 138 mmol/L (ref 135–146)
Total Bilirubin: 0.8 mg/dL (ref 0.2–1.2)
Total Protein: 7.4 g/dL (ref 6.1–8.1)

## 2019-01-26 LAB — LIPID PANEL
Cholesterol: 200 mg/dL — ABNORMAL HIGH (ref <200)
HDL: 39 mg/dL — ABNORMAL LOW (ref >=40)
LDL Cholesterol (Calc): 119 mg/dL (calc) — ABNORMAL HIGH
Non-HDL Cholesterol (Calc): 161 mg/dL (calc) — ABNORMAL HIGH (ref <130)
Total CHOL/HDL Ratio: 5.1 (calc) — ABNORMAL HIGH (ref <5.0)
Triglycerides: 276 mg/dL — ABNORMAL HIGH (ref <150)

## 2019-01-26 LAB — HIGH SENSITIVITY CRP: hs-CRP: 1.7 mg/L

## 2019-01-27 ENCOUNTER — Telehealth: Payer: Self-pay | Admitting: Family Medicine

## 2019-01-27 DIAGNOSIS — R739 Hyperglycemia, unspecified: Secondary | ICD-10-CM

## 2019-01-27 DIAGNOSIS — R7989 Other specified abnormal findings of blood chemistry: Secondary | ICD-10-CM

## 2019-01-27 DIAGNOSIS — E785 Hyperlipidemia, unspecified: Secondary | ICD-10-CM

## 2019-01-27 NOTE — Telephone Encounter (Signed)
Labs show the following:  Glucose is in diabetes range at 143.  This is very concerning.  I recommend getting regular exercise such as walking most days of the week.  In addition, it is important to minimize intake of processed carbohydrates such as breads; pastas; cereals; sugars/sweets.  We need to recheck fasting glucose and hemoglobin A1C in about 2-3 months.  If not improving, we may need to start medication.  Lifestyle changes mentioned above will help the lipid panel abnormalities.  Thyroid looks good.  Testosterone is abnormal again.  On Monday I will write an Rx according to the old Rx you provided.  We'll recheck these in 3-4 months.  All else looks ok.

## 2019-01-29 MED ORDER — TESTOSTERONE CYPIONATE 200 MG/ML IM SOLN: 50.0000 mg | INTRAMUSCULAR | 0 refills | Status: DC

## 2019-01-30 ENCOUNTER — Ambulatory Visit (INDEPENDENT_AMBULATORY_CARE_PROVIDER_SITE_OTHER): Payer: PPO

## 2019-01-30 DIAGNOSIS — R7989 Other specified abnormal findings of blood chemistry: Secondary | ICD-10-CM

## 2019-01-30 NOTE — Progress Notes (Signed)
I spoke with the patient about his testoterone prescriptions. He picked up the Rx for the cream form today. The injectable form is at the pharmacy. He will pick that up and decide if he would like to self-administer or if he would like to come in here once a week to have the injection administered by me. He can try the injections for a month and then switch over to the cream form or he can continue on the injections if he likes that better. He will let Dr. Junius Roads or me know.

## 2019-02-01 DIAGNOSIS — G4733 Obstructive sleep apnea (adult) (pediatric): Secondary | ICD-10-CM | POA: Diagnosis not present

## 2019-02-07 MED FILL — TESTOSTERONE CYP 200 MG/ML: 200 | 28 days supply | Qty: 4 | Fill #0

## 2019-02-13 MED FILL — AMOXICILLIN 500 MG CAPSULE: 500 | 3 days supply | Qty: 12 | Fill #1

## 2019-03-12 ENCOUNTER — Ambulatory Visit: Payer: Self-pay | Admitting: Orthopaedic Surgery

## 2019-03-14 ENCOUNTER — Ambulatory Visit: Payer: Self-pay | Admitting: Otolaryngology

## 2019-03-14 NOTE — H&P (Signed)
PREOPERATIVE H&P  Chief Complaint: Right cheek skin lesion.  Neck lipomas.  HPI: James Gallegos is a 68 y.o. male who presents for evaluation of Right preauricular skin lesion that bleeds when he shaves.  This is gradually gotten a little bit larger and measures approximately 1.5 cm in size and from the right ear.  He also has a posterior neck lipoma measuring approximately 3.5 cm in size and a small 1 cm posterior left neck nodule.  He is taken to the operating room at this time for excision under local anesthetic.  Past Medical History:  Diagnosis Date  . Arthritis    back & L knee, R foot - lis franc   . Deep vein thrombosis ([place])    after knee surgery in 2004  . GERD (gastroesophageal reflux disease)    only with pizza   . Hypertension   . Murmur, cardiac    pt. reports its difficult to auscultate   . Sleep apnea    CPAP- last study- 2014   Past Surgical History:  Procedure Laterality Date  . CERVICAL SPINE SURGERY N/A 1990   C5-6 discectomy  . FINGER SURGERY Right 2008   x 3 index finger  . KNEE ARTHROPLASTY    . KNEE ARTHROSCOPY Bilateral    L 2004, R 2005  . MASS EXCISION Right 01/18/2017   Procedure: EXCISION BENIGN RIGHT NECK MASS;  Surgeon: Rozetta Nunnery, MD;  Location: [facility];  Service: ENT;  Laterality: Right;  . NASAL FRACTURE SURGERY    . 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 TOTAL KNEE ARTHROPLASTY Right 06/01/2016   Procedure: TOTAL KNEE ARTHROPLASTY;  Surgeon: [name], MD;  Location: [facility];  Service: Orthopedics;  Laterality: Right;  . TOTAL KNEE ARTHROPLASTY Left 07/25/2018   Procedure: LEFT TOTAL KNEE ARTHROPLASTY;  Surgeon: [name], MD;  Location: [facility];  Service: Orthopedics;  Laterality: Left;   Social History   Socioeconomic History  . Marital status: Married    Spouse name: Not on file  . Number of children: Not on file  . Years of education: Not on file  . Highest education level: Not on file   Occupational History  . Not on file  Social Needs  . Financial resource strain: Not on file  . Food insecurity    Worry: Not on file    Inability: Not on file  . Transportation needs    Medical: Not on file    Non-medical: Not on file  Tobacco Use  . Smoking status: Never Smoker  . Smokeless tobacco: Never Used  Substance and Sexual Activity  . Alcohol use: Yes    Comment: rarely  . Drug use: No  . Sexual activity: Not on file  Lifestyle  . Physical activity    Days per week: Not on file    Minutes per session: Not on file  . Stress: Not on file  Relationships  . Social Herbalist on phone: Not on file    Gets together: Not on file    Attends religious service: Not on file    Active member of club or organization: Not on file    Attends meetings of clubs or organizations: Not on file    Relationship status: Not on file  Other Topics Concern  . Not on file  Social History Narrative  . Not on file   Family History  Problem Relation Age of Onset  . Alzheimer's disease Father   .  Colon cancer Neg Hx   . Esophageal cancer Neg Hx   . Rectal cancer Neg Hx   . Stomach cancer Neg Hx    No Known Allergies Prior to Admission medications   Medication Sig Start Date End Date Taking? Authorizing Provider  acetaminophen (TYLENOL) 325 MG tablet Take 2 tablets (650 mg total) by mouth every 6 (six) hours. 07/26/18   Cherylann Ratel, PA-C  Ascorbic Acid (VITAMIN C) 1000 MG tablet Take 5,000 mg by mouth daily.    [provider]  aspirin EC 325 MG EC tablet Take 1 tablet (325 mg total) by mouth daily with breakfast. Patient not taking: Reported on 01/08/2019 07/27/18   Biagio Borg D, PA-C  B Complex-C (B-COMPLEX WITH VITAMIN C) tablet Take 1 tablet by mouth daily.    [provider]  Cholecalciferol (VITAMIN D-3) 5000 units TABS Take 5,000 Units by mouth daily.    [provider]  Cyanocobalamin (VITAMIN B-12) 5000 MCG TBDP Take 5,000 mcg by  mouth daily.    [provider]  DHEA 50 MG CAPS Take 100 mg by mouth daily.    [provider]  Diindolylmethane POWD Take 250 mg by mouth daily.    [provider]  fluticasone (FLONASE) 50 MCG/ACT nasal spray Place 1 spray into both nostrils daily as needed for allergies or rhinitis.    [provider]  Javier Docker Oil 500 MG CAPS Take 500 mg by mouth daily.    [provider]  losartan-hydrochlorothiazide (HYZAAR) 100-25 MG tablet TAKE 1 TABLET BY MOUTH DAILY. 09/21/18   Aldine Contes, MD  Lysine HCl 1000 MG TABS Take 2,000 mg by mouth daily.    [provider]  Magnesium 400 MG TABS Take by mouth daily at 8 pm.     [provider]  Menaquinone-7 (VITAMIN K2) 40 MCG TABS Take by mouth daily.    [provider]  QUERCETIN PO Take 1,000 mg by mouth daily.    [provider]  saw palmetto 160 MG capsule Take 320 mg by mouth daily.    [provider]  testosterone cypionate (DEPOTESTOSTERONE CYPIONATE) 200 MG/ML injection Inject 0.25 mLs (50 mg total) into the muscle every 7 (seven) days. 01/29/19   Hilts, Legrand Como, MD     Positive ROS: Negative  All other systems have been reviewed and were otherwise negative with the exception of those mentioned in the HPI and as above.  Physical Exam: There were no vitals filed for this visit.  General: Alert, no acute distress Oral: Normal oral mucosa and tonsils Nasal: Clear nasal passages Neck: No palpable adenopathy or thyroid nodules. 1.5 cm right preauricular skin lesion. 3.5 cm posterior neck lipoma. 1 cm left neck subcutaneous nodule Ear: Ear canal is clear with normal appearing TMs Cardiovascular: Regular rate and rhythm, no murmur.  Respiratory: Clear to auscultation Neurologic: Alert and oriented x 3   Assessment/Plan: NEOPLASMA OF THE SKIN,LYMPOMATOUS SEBACEOUS OF HEAD AND NECK Plan for Procedure(s): EXCISION RIGHT PREAURIULAR 1.5 CM SKIN LESION AND  RIGHT NECK LIPOMA,,CLOSURE FACE AND INTERMITTENT CLOSURE NECK   Melony Overly, MD 03/14/2019 10:27 AM

## 2019-03-20 ENCOUNTER — Other Ambulatory Visit (HOSPITAL_COMMUNITY)
Admission: RE | Admit: 2019-03-20 | Discharge: 2019-03-20 | Disposition: A | Discharge status: Home / Self Care | Payer: PPO | Source: Ambulatory Visit | Attending: Otolaryngology | Admitting: Otolaryngology

## 2019-03-20 DIAGNOSIS — Z20828 Contact with and (suspected) exposure to other viral communicable diseases: Secondary | ICD-10-CM | POA: Insufficient documentation

## 2019-03-20 DIAGNOSIS — Z01812 Encounter for preprocedural laboratory examination: Secondary | ICD-10-CM | POA: Insufficient documentation

## 2019-03-21 ENCOUNTER — Other Ambulatory Visit: Payer: Self-pay

## 2019-03-21 ENCOUNTER — Encounter (HOSPITAL_BASED_OUTPATIENT_CLINIC_OR_DEPARTMENT_OTHER): Payer: Self-pay

## 2019-03-21 LAB — NOVEL CORONAVIRUS, NAA (HOSP ORDER, SEND-OUT TO REF LAB; TAT 18-24 HRS): SARS-CoV-2, NAA: NOT DETECTED

## 2019-03-23 ENCOUNTER — Ambulatory Visit (HOSPITAL_BASED_OUTPATIENT_CLINIC_OR_DEPARTMENT_OTHER): Payer: 59 | Admitting: Anesthesiology

## 2019-03-23 ENCOUNTER — Encounter (HOSPITAL_BASED_OUTPATIENT_CLINIC_OR_DEPARTMENT_OTHER)
Admission: RE | Disposition: A | Discharge status: Home / Self Care | Payer: Self-pay | Source: Home / Self Care | Attending: Otolaryngology

## 2019-03-23 ENCOUNTER — Ambulatory Visit (HOSPITAL_BASED_OUTPATIENT_CLINIC_OR_DEPARTMENT_OTHER)
Admission: RE | Admit: 2019-03-23 | Discharge: 2019-03-23 | Disposition: A | Discharge status: Home / Self Care | Payer: 59 | Attending: Otolaryngology | Admitting: Otolaryngology

## 2019-03-23 ENCOUNTER — Other Ambulatory Visit: Payer: Self-pay

## 2019-03-23 ENCOUNTER — Encounter (HOSPITAL_BASED_OUTPATIENT_CLINIC_OR_DEPARTMENT_OTHER): Payer: Self-pay | Admitting: *Deleted

## 2019-03-23 DIAGNOSIS — M479 Spondylosis, unspecified: Secondary | ICD-10-CM | POA: Diagnosis not present

## 2019-03-23 DIAGNOSIS — G4730 Sleep apnea, unspecified: Secondary | ICD-10-CM | POA: Insufficient documentation

## 2019-03-23 DIAGNOSIS — L723 Sebaceous cyst: Secondary | ICD-10-CM | POA: Diagnosis not present

## 2019-03-23 DIAGNOSIS — Z7982 Long term (current) use of aspirin: Secondary | ICD-10-CM | POA: Diagnosis not present

## 2019-03-23 DIAGNOSIS — Z96653 Presence of artificial knee joint, bilateral: Secondary | ICD-10-CM | POA: Insufficient documentation

## 2019-03-23 DIAGNOSIS — I10 Essential (primary) hypertension: Secondary | ICD-10-CM | POA: Diagnosis not present

## 2019-03-23 DIAGNOSIS — G4733 Obstructive sleep apnea (adult) (pediatric): Secondary | ICD-10-CM

## 2019-03-23 DIAGNOSIS — L821 Other seborrheic keratosis: Secondary | ICD-10-CM | POA: Insufficient documentation

## 2019-03-23 DIAGNOSIS — M1712 Unilateral primary osteoarthritis, left knee: Secondary | ICD-10-CM | POA: Diagnosis not present

## 2019-03-23 DIAGNOSIS — L989 Disorder of the skin and subcutaneous tissue, unspecified: Secondary | ICD-10-CM | POA: Diagnosis present

## 2019-03-23 DIAGNOSIS — L820 Inflamed seborrheic keratosis: Secondary | ICD-10-CM | POA: Diagnosis not present

## 2019-03-23 DIAGNOSIS — D170 Benign lipomatous neoplasm of skin and subcutaneous tissue of head, face and neck: Secondary | ICD-10-CM | POA: Insufficient documentation

## 2019-03-23 DIAGNOSIS — Z79899 Other long term (current) drug therapy: Secondary | ICD-10-CM | POA: Diagnosis not present

## 2019-03-23 HISTORY — PX: MASS EXCISION: SHX2000

## 2019-03-23 SURGERY — EXCISION MASS
Anesthesia: Monitor Anesthesia Care | Site: Neck | Laterality: Bilateral

## 2019-03-23 MED ORDER — FENTANYL CITRATE (PF) 100 MCG/2ML IJ SOLN: 25.0000 ug | INTRAMUSCULAR | Status: DC | PRN

## 2019-03-23 MED ORDER — ONDANSETRON HCL 4 MG/2ML IJ SOLN
INTRAMUSCULAR | Status: AC
  Filled 2019-03-23: qty 2

## 2019-03-23 MED ORDER — LACTATED RINGERS IV SOLN
INTRAVENOUS | Status: DC
  Administered 2019-03-23: 07:00:00 via INTRAVENOUS

## 2019-03-23 MED ORDER — ONDANSETRON HCL 4 MG/2ML IJ SOLN: 4.0000 mg | Freq: Once | INTRAMUSCULAR | Status: DC | PRN

## 2019-03-23 MED ORDER — CHLORHEXIDINE GLUCONATE CLOTH 2 % EX PADS: 6.0000 | MEDICATED_PAD | Freq: Once | CUTANEOUS | Status: DC

## 2019-03-23 MED ORDER — LIDOCAINE-EPINEPHRINE 1 %-1:100000 IJ SOLN
INTRAMUSCULAR | Status: DC | PRN
  Administered 2019-03-23: 18 mL

## 2019-03-23 MED ORDER — PROPOFOL 500 MG/50ML IV EMUL
INTRAVENOUS | Status: AC
  Filled 2019-03-23: qty 50

## 2019-03-23 MED ORDER — EPINEPHRINE PF 1 MG/ML IJ SOLN
INTRAMUSCULAR | Status: AC
  Filled 2019-03-23: qty 1

## 2019-03-23 MED ORDER — CEFAZOLIN SODIUM-DEXTROSE 2-3 GM-%(50ML) IV SOLR
INTRAVENOUS | Status: DC | PRN
  Administered 2019-03-23: 2 g via INTRAVENOUS

## 2019-03-23 MED ORDER — BACITRACIN ZINC 500 UNIT/GM EX OINT
TOPICAL_OINTMENT | CUTANEOUS | Status: AC
  Filled 2019-03-23: qty 28.35

## 2019-03-23 MED ORDER — BACITRACIN 500 UNIT/GM EX OINT
TOPICAL_OINTMENT | CUTANEOUS | Status: DC | PRN
  Administered 2019-03-23: 1 via TOPICAL

## 2019-03-23 MED ORDER — FENTANYL CITRATE (PF) 100 MCG/2ML IJ SOLN
INTRAMUSCULAR | Status: AC
  Filled 2019-03-23: qty 2

## 2019-03-23 MED ORDER — CEFAZOLIN SODIUM 1 G IJ SOLR
INTRAMUSCULAR | Status: AC
  Filled 2019-03-23: qty 20

## 2019-03-23 MED ORDER — LIDOCAINE-EPINEPHRINE 1 %-1:100000 IJ SOLN
INTRAMUSCULAR | Status: AC
  Filled 2019-03-23: qty 3

## 2019-03-23 MED ORDER — CEPHALEXIN 500 MG PO CAPS: 500.0000 mg | ORAL_CAPSULE | Freq: Two times a day (BID) | ORAL | 0 refills | Status: DC

## 2019-03-23 MED ORDER — PROPOFOL 10 MG/ML IV BOLUS
INTRAVENOUS | Status: AC
  Filled 2019-03-23: qty 20

## 2019-03-23 MED ORDER — LIDOCAINE 2% (20 MG/ML) 5 ML SYRINGE
INTRAMUSCULAR | Status: AC
  Filled 2019-03-23: qty 5

## 2019-03-23 MED ORDER — OXYMETAZOLINE HCL 0.05 % NA SOLN
NASAL | Status: AC
  Filled 2019-03-23: qty 30

## 2019-03-23 MED ORDER — METHYLENE BLUE 0.5 % INJ SOLN
INTRAVENOUS | Status: AC
  Filled 2019-03-23: qty 10

## 2019-03-23 MED FILL — HYDROCODON-APAP 5-325: 5-325 | 2 days supply | Qty: 10 | Fill #0

## 2019-03-23 MED FILL — CEPHALEXIN 500 MG CAPSULE: 500 | 5 days supply | Qty: 10 | Fill #0

## 2019-03-23 SURGICAL SUPPLY — 76 items
ADH SKN CLS APL DERMABOND .7 (GAUZE/BANDAGES/DRESSINGS)
APL SKNCLS STERI-STRIP NONHPOA (GAUZE/BANDAGES/DRESSINGS)
BENZOIN TINCTURE PRP APPL 2/3 (GAUZE/BANDAGES/DRESSINGS) IMPLANT
BLADE CLIPPER SURG (BLADE) ×2 IMPLANT
BLADE SURG 15 STRL LF DISP TIS (BLADE) ×1 IMPLANT
BLADE SURG 15 STRL SS (BLADE) ×6
BNDG CONFORM 3 STRL LF (GAUZE/BANDAGES/DRESSINGS) IMPLANT
BNDG CONFORM 4 STRL LF (GAUZE/BANDAGES/DRESSINGS) IMPLANT
CANISTER SUCT 1200ML W/VALVE (MISCELLANEOUS) ×2 IMPLANT
CLEANER CAUTERY TIP 5X5 PAD (MISCELLANEOUS) IMPLANT
CLOSURE WOUND 1/2 X4 (GAUZE/BANDAGES/DRESSINGS)
CLOSURE WOUND 1/4X4 (GAUZE/BANDAGES/DRESSINGS)
CORD BIPOLAR FORCEPS 12FT (ELECTRODE) ×2 IMPLANT
COVER BACK TABLE REUSABLE LG (DRAPES) ×3 IMPLANT
COVER MAYO STAND REUSABLE (DRAPES) ×3 IMPLANT
COVER WAND RF STERILE (DRAPES) IMPLANT
DECANTER SPIKE VIAL GLASS SM (MISCELLANEOUS) IMPLANT
DERMABOND ADVANCED (GAUZE/BANDAGES/DRESSINGS)
DERMABOND ADVANCED .7 DNX12 (GAUZE/BANDAGES/DRESSINGS) IMPLANT
DRAPE U-SHAPE 76X120 STRL (DRAPES) ×5 IMPLANT
ELECT COATED BLADE 2.86 ST (ELECTRODE) ×3 IMPLANT
ELECT REM PT RETURN 9FT ADLT (ELECTROSURGICAL) ×3
ELECTRODE REM PT RTRN 9FT ADLT (ELECTROSURGICAL) ×1 IMPLANT
FORCEPS BIPOLAR SPETZLER 8 1.0 (NEUROSURGERY SUPPLIES) ×1 IMPLANT
GAUZE 4X4 16PLY RFD (DISPOSABLE) IMPLANT
GAUZE SPONGE 4X4 12PLY STRL LF (GAUZE/BANDAGES/DRESSINGS) IMPLANT
GLOVE BIOGEL PI IND STRL 7.5 (GLOVE) IMPLANT
GLOVE BIOGEL PI INDICATOR 7.5 (GLOVE) ×2
GLOVE EXAM NITRILE MD LF STRL (GLOVE) ×4 IMPLANT
GLOVE SKINSENSE NS SZ7.5 (GLOVE) ×2
GLOVE SKINSENSE STRL SZ7.5 (GLOVE) IMPLANT
GLOVE SS BIOGEL STRL SZ 7.5 (GLOVE) ×1 IMPLANT
GLOVE SUPERSENSE BIOGEL SZ 7.5 (GLOVE) ×2
GOWN STRL REUS W/ TWL LRG LVL3 (GOWN DISPOSABLE) ×1 IMPLANT
GOWN STRL REUS W/ TWL XL LVL3 (GOWN DISPOSABLE) ×1 IMPLANT
GOWN STRL REUS W/TWL LRG LVL3 (GOWN DISPOSABLE)
GOWN STRL REUS W/TWL XL LVL3 (GOWN DISPOSABLE) ×6
HEMOSTAT SURGICEL .5X2 ABSORB (HEMOSTASIS) IMPLANT
NDL HYPO 25X1 1.5 SAFETY (NEEDLE) IMPLANT
NDL PRECISIONGLIDE 27X1.5 (NEEDLE) IMPLANT
NEEDLE HYPO 25X1 1.5 SAFETY (NEEDLE) IMPLANT
NEEDLE PRECISIONGLIDE 27X1.5 (NEEDLE) ×3 IMPLANT
NS IRRIG 1000ML POUR BTL (IV SOLUTION) ×3 IMPLANT
PACK BASIN DAY SURGERY FS (CUSTOM PROCEDURE TRAY) ×3 IMPLANT
PAD CLEANER CAUTERY TIP 5X5 (MISCELLANEOUS)
PENCIL BUTTON HOLSTER BLD 10FT (ELECTRODE) ×3 IMPLANT
SPONGE INTESTINAL PEANUT (DISPOSABLE) IMPLANT
STAPLER VISISTAT 35W (STAPLE) IMPLANT
STRIP CLOSURE SKIN 1/2X4 (GAUZE/BANDAGES/DRESSINGS) IMPLANT
STRIP CLOSURE SKIN 1/4X4 (GAUZE/BANDAGES/DRESSINGS) IMPLANT
SUCTION FRAZIER HANDLE 10FR (MISCELLANEOUS) ×2
SUCTION TUBE FRAZIER 10FR DISP (MISCELLANEOUS) IMPLANT
SUT CHROMIC 2 0 SH (SUTURE) ×2 IMPLANT
SUT CHROMIC 3 0 PS 2 (SUTURE) ×1 IMPLANT
SUT CHROMIC 3 0 SH 27 (SUTURE) IMPLANT
SUT ETHILON 4 0 PS 2 18 (SUTURE) IMPLANT
SUT ETHILON 5 0 P 3 18 (SUTURE) ×4
SUT ETHILON 6 0 P 1 (SUTURE) ×2 IMPLANT
SUT NYLON ETHILON 5-0 P-3 1X18 (SUTURE) ×1 IMPLANT
SUT SILK 2 0 TIES 17X18 (SUTURE)
SUT SILK 2-0 18XBRD TIE BLK (SUTURE) IMPLANT
SUT SILK 3 0 SH 30 (SUTURE) IMPLANT
SUT SILK 3 0 TIES 17X18 (SUTURE)
SUT SILK 3-0 18XBRD TIE BLK (SUTURE) IMPLANT
SUT VIC AB 4-0 P-3 18XBRD (SUTURE) IMPLANT
SUT VIC AB 4-0 P3 18 (SUTURE) ×3
SUT VIC AB 5-0 P-3 18X BRD (SUTURE) IMPLANT
SUT VIC AB 5-0 P3 18 (SUTURE)
SWAB COLLECTION DEVICE MRSA (MISCELLANEOUS) IMPLANT
SWAB CULTURE ESWAB REG 1ML (MISCELLANEOUS) IMPLANT
SYR BULB 3OZ (MISCELLANEOUS) ×3 IMPLANT
SYR CONTROL 10ML LL (SYRINGE) ×2 IMPLANT
TOWEL GREEN STERILE FF (TOWEL DISPOSABLE) ×6 IMPLANT
TRAY DSU PREP LF (CUSTOM PROCEDURE TRAY) ×3 IMPLANT
TUBE CONNECTING 20'X1/4 (TUBING) ×1
TUBE CONNECTING 20X1/4 (TUBING) ×1 IMPLANT

## 2019-03-23 NOTE — Transfer of Care (Signed)
Immediate Anesthesia Transfer of Care Note  Patient: James Gallegos  Procedure(s) Performed: EXCISIONS OF RIGHT CHEEK LESION, RIGHT POSTERIOR NECK LIPOMA  AND LEFT NECK NODULE (Bilateral Neck)  Patient Location: PACU  Anesthesia Type:MAC  Level of Consciousness: awake, alert , oriented and patient cooperative  Airway & Oxygen Therapy: Patient Spontanous Breathing  Post-op Assessment: Report given to RN and Post -op Vital signs reviewed and stable  Post vital signs: Reviewed and stable  Last Vitals:  Vitals Value Taken Time  BP    Temp    Pulse    Resp    SpO2      Last Pain:  Vitals:   03/23/19 0642  TempSrc: Oral  PainSc: 0-No pain      Patients Stated Pain Goal: 0 (41/58/30 9407)  Complications: No apparent anesthesia complications

## 2019-03-23 NOTE — Interval H&P Note (Signed)
History and Physical Interval Note:  03/23/2019 9:58 AM  James Gallegos  has presented today for surgery, with the diagnosis of NEOPLASMA OF THE SKIN,LYMPOMATOUS SEBACEOUS OF HEAD AND NECK.  The various methods of treatment have been discussed with the patient and family. After consideration of risks, benefits and other options for treatment, the patient has consented to  Procedure(s): EXCISIONS OF RIGHT CHEEK LESION, RIGHT POSTERIOR NECK LIPOMA  AND LEFT NECK NODULE (Bilateral) as a surgical intervention.  The patient's history has been reviewed, patient examined, no change in status, stable for surgery.  I have reviewed the patient's chart and labs.  Questions were answered to the patient's satisfaction.     Melony Overly

## 2019-03-23 NOTE — Anesthesia Preprocedure Evaluation (Addendum)
Anesthesia Evaluation  Patient identified by MRN, date of birth, ID band Patient awake    Reviewed: Allergy & Precautions, NPO status , Patient's Chart, lab work & pertinent test results  History of Anesthesia Complications Negative for: history of anesthetic complications  Airway Mallampati: II  TM Distance: <3 FB Neck ROM: Full    Dental  (+) Dental Advisory Given, Missing   Pulmonary sleep apnea and Continuous Positive Airway Pressure Ventilation ,    Pulmonary exam normal breath sounds clear to auscultation       Cardiovascular hypertension, Pt. on medications + DVT  Normal cardiovascular exam Rhythm:Regular Rate:Normal     Neuro/Psych negative neurological ROS     GI/Hepatic negative GI ROS, Neg liver ROS,   Endo/Other  Obesity   Renal/GU negative Renal ROS     Musculoskeletal  (+) Arthritis ,   Abdominal   Peds  Hematology negative hematology ROS (+)   Anesthesia Other Findings Day of surgery medications reviewed with the patient.  Reproductive/Obstetrics                           Anesthesia Physical Anesthesia Plan  ASA: II  Anesthesia Plan: MAC   Post-op Pain Management:    Induction: Intravenous  PONV Risk Score and Plan: 1 and Propofol infusion and Treatment may vary due to age or medical condition  Airway Management Planned: Nasal Cannula and Natural Airway  Additional Equipment:   Intra-op Plan:   Post-operative Plan:   Informed Consent: I have reviewed the patients History and Physical, chart, labs and discussed the procedure including the risks, benefits and alternatives for the proposed anesthesia with the patient or authorized representative who has indicated his/her understanding and acceptance.     Dental advisory given  Plan Discussed with: CRNA and Anesthesiologist  Anesthesia Plan Comments:         Anesthesia Quick Evaluation

## 2019-03-23 NOTE — Discharge Instructions (Addendum)
Keep incisions dry for the next 24 hrs Apply antibiotic ointment to incision sites daily and keep clean Tylenol or ibuprofen prn pain Keflex 500 mg bid for the next 5 days Return to Dr Pollie Friar office next Thursday at 4:50 to have sutures removed

## 2019-03-23 NOTE — Brief Op Note (Signed)
03/23/2019  9:58 AM  PATIENT:  James Gallegos  68 y.o. male  PRE-OPERATIVE DIAGNOSIS:  NEOPLASMA OF THE SKIN,LYMPOMATOUS SEBACEOUS OF HEAD AND NECK  POST-OPERATIVE DIAGNOSIS:  NEOPLASMA OF THE SKIN,LYMPOMATOUS SEBACEOUS OF HEAD AND NECK  PROCEDURE:  Procedure(s): EXCISIONS OF RIGHT CHEEK LESION, RIGHT POSTERIOR NECK LIPOMA  AND LEFT NECK NODULE (Bilateral) 1.5 cm, 5.5 cm, 1.5 cm    With Intermediate closure.  SURGEON:  Surgeon(s) and Role:    * Rozetta Nunnery, MD - Primary  PHYSICIAN ASSISTANT:   ASSISTANTS: none   ANESTHESIA:   local  EBL:  minimal   BLOOD ADMINISTERED:none  DRAINS: none   LOCAL MEDICATIONS USED:  XYLOCAINE with EPI 16 cc  SPECIMEN:  Source of Specimen:  right cheek lesion  DISPOSITION OF SPECIMEN:  PATHOLOGY  COUNTS:  YES  TOURNIQUET:  * No tourniquets in log *  DICTATION: .Other Dictation: Dictation Number [id number]  PLAN OF CARE: Discharge to home after PACU  PATIENT DISPOSITION:  PACU - hemodynamically stable.   Delay start of Pharmacological VTE agent (>24hrs) due to surgical blood loss or risk of bleeding: yes

## 2019-03-23 NOTE — Anesthesia Postprocedure Evaluation (Signed)
Anesthesia Post Note  Patient: DAREY HERSHBERGER  Procedure(s) Performed: EXCISIONS OF RIGHT CHEEK LESION, RIGHT POSTERIOR NECK LIPOMA  AND LEFT NECK NODULE (Bilateral Neck)     Patient location during evaluation: PACU Anesthesia Type: MAC Level of consciousness: awake and alert Pain management: pain level controlled Vital Signs Assessment: post-procedure vital signs reviewed and stable Respiratory status: spontaneous breathing, nonlabored ventilation and respiratory function stable Cardiovascular status: stable and blood pressure returned to baseline Postop Assessment: no apparent nausea or vomiting Anesthetic complications: no    Last Vitals:  Vitals:   03/23/19 0642 03/23/19 1002  BP: (!) 159/78 (!) 150/73  Pulse: 70 66  Resp: 18 13  Temp: 36.8 C 37 C  SpO2: 99% 98%    Last Pain:  Vitals:   03/23/19 1002  TempSrc: Oral  PainSc: 0-No pain                 Catalina Gravel

## 2019-03-24 NOTE — Op Note (Signed)
NAME: James Gallegos, James Gallegos MEDICAL RECORD [id number] ACCOUNT [id number] DATE OF BIRTH:Nov 08, 1950 FACILITY: [facility] LOCATION: [facility] PHYSICIAN:CHRISTOPHER Lincoln Maxin, MD  OPERATIVE REPORT  DATE OF PROCEDURE:  03/23/2019  PREOPERATIVE DIAGNOSES: 1.  A 1.5 cm right cheek superficial skin lesion. 2.  Posterior right neck lipoma 5.5 cm size. 3.  Left neck sebaceous cyst 1.5 cm in size.  POSTOPERATIVE DIAGNOSES:   PROCEDURE:  Excision of right cheek lesion with intermediate closure, excision of posterior neck lipoma with intermediate closure, and excision of left neck sebaceous cyst with intermediate closure.  SURGEON:  Melony Overly, MD  ANESTHESIA:  Local Xylocaine with epinephrine, 16 mL.  ESTIMATED BLOOD LOSS:  Minimal.  COMPLICATIONS:  None.  BRIEF CLINICAL NOTE:  The patient is a 68 year old gentleman who has had a lesion on his left cheek for several years that he intermittently cuts when he shaves.  It is slightly raised and he wanted to have it removed.  It is located just in front of the  tragus on the right side of his cheek.  It measures approximately 1 to 1.5 cm in size.  He also had a lump in the back of his neck about egg size.  Clinical exam consistent with a probable lipoma.  He also notices a small nodule on the left neck just  behind the left ear at the hairline that is consistent with probable cyst that is slightly raised.  He was taken to the operating room at this time for excision of these and local anesthetic.  DESCRIPTION OF PROCEDURE:  The patient was initially placed on his left lateral position so as to expose the right cheek lesion and the posterior right neck lipoma.  These areas were prepped with Betadine solution and draped out with sterile towels.  The  cheek lesion was injected with 3-4 mL of Xylocaine with epinephrine for local anesthetic, and then the posterior neck lesion was injected with approximately 10 mL of Xylocaine with epinephrine for  local anesthetic.  First, the cheek lesion was  elliptically incised and sent to pathology with a suture on the posterior margin of the excision.  This area was closed with 4-0 Vicryl suture subcutaneously and 6-0 nylon to reapproximate the skin edges.  It had minimal bleeding that was controlled with  cautery.  Next, the neck and lipoma was removed.  A horizontal incision was made directly over the lipoma.  Dissection was carried down through the subcutaneous tissue.  There was a large lump or lipoma that was very adherent to surrounding tissues.   This was dissected out mostly sharply and partly bluntly.  The lipoma was removed and was consistent with a benign lipoma.  It was solid fat after removing it, cutting it through the middle.  This was not sent to pathology.  Hemostasis was obtained with  cautery as well as bipolar.  Wound was irrigated with saline, and then this wound likewise was closed with a 2-0 chromic suture subcutaneously and then 5-0 nylon sutures to reapproximate the skin edges.  Lastly, the patient was laid back on his back, and  the left neck area nodule was marked out and prepped with Betadine solution.  This was consistent with a sebaceous cyst, and the overlying skin was elliptically excised, along with the cyst in the subcutaneous tissue.  Hemostasis was obtained with  cautery, and the small defect was closed with 4-0 Vicryl suture subcutaneously and 5-0 nylon to reapproximate the skin edges.  Bacitracin ointment was applied to all 3 incisions  as well as dressings.  He was instructed to keep wound dry for the next 24  hours.  Placed on Keflex 500 mg b.i.d. for 5 days, and he will follow up in office in 6 days for recheck and to have sutures removed.  LN/NUANCE  D:03/23/2019 T:03/23/2019 JOB:[id number]

## 2019-03-26 ENCOUNTER — Encounter (HOSPITAL_BASED_OUTPATIENT_CLINIC_OR_DEPARTMENT_OTHER): Payer: Self-pay | Admitting: Otolaryngology

## 2019-03-27 LAB — SURGICAL PATHOLOGY

## 2019-03-28 ENCOUNTER — Encounter: Payer: Self-pay | Admitting: Family Medicine

## 2019-03-28 MED ORDER — LOSARTAN POTASSIUM-HCTZ 100-25 MG PO TABS: 1.0000 | ORAL_TABLET | Freq: Every day | ORAL | 1 refills | Status: DC

## 2019-03-28 MED FILL — LOSARTAN-HCTZ 100-25 MG TAB: 100-25 | 90 days supply | Qty: 90 | Fill #0

## 2019-04-05 DIAGNOSIS — J310 Chronic rhinitis: Secondary | ICD-10-CM | POA: Diagnosis not present

## 2019-04-12 DIAGNOSIS — G4733 Obstructive sleep apnea (adult) (pediatric): Secondary | ICD-10-CM | POA: Diagnosis not present

## 2019-05-15 ENCOUNTER — Ambulatory Visit (INDEPENDENT_AMBULATORY_CARE_PROVIDER_SITE_OTHER): Payer: 59

## 2019-05-15 ENCOUNTER — Other Ambulatory Visit: Payer: Self-pay

## 2019-05-15 ENCOUNTER — Encounter: Payer: Self-pay | Admitting: Orthopaedic Surgery

## 2019-05-15 ENCOUNTER — Ambulatory Visit (INDEPENDENT_AMBULATORY_CARE_PROVIDER_SITE_OTHER): Payer: 59 | Admitting: Orthopaedic Surgery

## 2019-05-15 VITALS — Ht 69.0 in | Wt 233.0 lb

## 2019-05-15 DIAGNOSIS — G8929 Other chronic pain: Secondary | ICD-10-CM

## 2019-05-15 DIAGNOSIS — M5442 Lumbago with sciatica, left side: Secondary | ICD-10-CM

## 2019-05-15 DIAGNOSIS — M542 Cervicalgia: Secondary | ICD-10-CM

## 2019-05-15 DIAGNOSIS — M545 Low back pain, unspecified: Secondary | ICD-10-CM

## 2019-05-15 MED ORDER — METHOCARBAMOL 500 MG PO TABS: ORAL_TABLET | ORAL | 0 refills | Status: DC

## 2019-05-15 MED FILL — AMOXICILLIN 500 MG CAPSULE: 500 | 3 days supply | Qty: 12 | Fill #0

## 2019-05-15 MED FILL — METHOCARBAMOL 500 MG TABS: 500 | 10 days supply | Qty: 20 | Fill #0

## 2019-05-15 NOTE — Progress Notes (Signed)
Office Visit Note   Patient: James Gallegos           Date of Birth: [birth year]           MRN: [id number] Visit Date: 05/15/2019              Requested by: Eunice Blase, MD [address] [place],  [place] [address] PCP: Eunice Blase, MD   Assessment & Plan: Visit Diagnoses:  1. Neck pain   2. Chronic left-sided low back pain, unspecified whether sciatica present   3. Acute left-sided low back pain with left-sided sciatica     Plan: Chronic low back pain with evidence of left lower extremity radiculopathy.  Has had trouble for over a year with some loss of sleep.  Will try Robaxin to help him sleep at night and order an MRI scan of his lumbar spine.  I suspect he has spinal stenosis. Some mild arthritis of the cervical spine.  Tingling into this left little finger is probably related to the ulnar nerve at the left elbow.  Long discussion regarding protecting of his elbow and the nerve in trying Voltaren gel  Follow-Up Instructions: Return After MRI scan lumbar spine.   Orders:  Orders Placed This Encounter  Procedures   XR Lumbar Spine 2-3 Views   XR Cervical Spine 2 or 3 views   MR Lumbar Spine w/o contrast   Meds ordered this encounter  Medications   methocarbamol (ROBAXIN) 500 MG tablet    Sig: 1 PO BID prn    Dispense:  20 tablet    Refill:  0      Procedures: No procedures performed   Clinical Data: No additional findings.   Subjective: Chief Complaint  Patient presents with   Lower Back - Pain  Patient presents today for lower back and neck pain. He has continued to hurt and said that it has worsened since his visit one year ago. He has more difficulty lying down.He has had some pain that travels down his left leg and into his knee. He has pain and tingling in his left little finger and along that side of his hand. No numbness or weakness. He is taking Ibuprofen, but states that it does not help. He has taken a Valium of a family members and states  that it has helped the most.   HPI  Review of Systems  Constitutional: Negative for fatigue.  HENT: Negative for ear pain.   Eyes: Negative for pain.  Respiratory: Negative for shortness of breath.   Cardiovascular: Negative for leg swelling.  Gastrointestinal: Negative for constipation and diarrhea.  Endocrine: Negative for cold intolerance and heat intolerance.  Genitourinary: Negative for difficulty urinating.  Musculoskeletal: Negative for joint swelling.  Skin: Negative for rash.  Allergic/Immunologic: Negative for food allergies.  Neurological: Negative for weakness.  Hematological: Does not bruise/bleed easily.  Psychiatric/Behavioral: Positive for sleep disturbance.     Objective: Vital Signs: Ht 5\' 9"  (1.753 m)    Wt 233 lb (105.7 kg)    BMI 34.41 kg/m   Physical Exam Constitutional:      Appearance: He is well-developed.  Eyes:     Pupils: Pupils are equal, round, and reactive to light.  Pulmonary:     Effort: Pulmonary effort is normal.  Skin:    General: Skin is warm and dry.  Neurological:     Mental Status: He is alert and oriented to person, place, and time.  Psychiatric:  Behavior: Behavior normal.     Ortho Exam awake alert and oriented x3.  Comfortable sitting.  Straight leg raise slightly positive on the left at about 80 degrees.  Motor and sensory exam appears to be intact.  No pain with range of motion or function of either knee replacement.  No pain with range of motion of either hip.  Very mild percussible tenderness of lumbar spine.  No pain over the SI joints.  Has Tinel's over the ulnar nerve at the left elbow and not on the right.  Does have subjective tingling in the little finger but motor exam intact.  No referred pain to either upper extremity with motion of the cervical spine  Specialty Comments:  No specialty comments available.  Imaging: Xr Cervical Spine 2 Or 3 Views  Result Date: 05/15/2019 Films of the cervical spine were  obtained in 2 projections.  There appears to be an old fusion at C5-6 without instrumentation.  There are some mild degenerative changes at C4-5.  No listhesis.  There are also some degenerative changes at C1-2.  No acute changes or evidence of fracture.  Xr Lumbar Spine 2-3 Views  Result Date: 05/15/2019 Films of the lumbar spine were obtained in 2 projections.  There is a right-sided degenerative scoliosis.  There is considerable degenerative disc disease at L2-3, L3-4 and L4-5 with near complete obliteration of the disc spaces.  No listhesis.  Considerable facet arthritis at each of the above levels.    PMFS History: Patient Active Problem List   Diagnosis Date Noted   Class 2 obesity due to excess calories without serious comorbidity with body mass index (BMI) of 39.0 to 39.9 in adult 01/08/2019   Pain in right elbow 12/06/2018   Primary osteoarthritis of left knee 07/25/2018   History of left knee replacement 07/25/2018   Right foot pain 06/22/2018   Acute left-sided low back pain with left-sided sciatica 06/22/2018   Osteoarthritis 11/21/2017   Hyperlipidemia 11/21/2017   Low testosterone 09/26/2016   Healthcare maintenance 09/26/2016   S/P total knee replacement using cement, right 06/01/2016   Sleep apnea 05/19/2016   Hypertension 05/19/2016   Past Medical History:  Diagnosis Date   Arthritis    back & L knee, R foot - lis franc    Deep vein thrombosis ([place])    after knee surgery in 2004   Hypertension    Murmur, cardiac    pt. reports its difficult to auscultate    Sleep apnea    CPAP- last study- 2014    Family History  Problem Relation Age of Onset   Alzheimer's disease Father    Colon cancer Neg Hx    Esophageal cancer Neg Hx    Rectal cancer Neg Hx    Stomach cancer Neg Hx     Past Surgical History:  Procedure Laterality Date   CERVICAL SPINE SURGERY N/A 1990   C5-6 discectomy   FINGER SURGERY Right 2008   x 3 index finger    KNEE ARTHROPLASTY     KNEE ARTHROSCOPY Bilateral    L 2004, R 2005   MASS EXCISION Right 01/18/2017   Procedure: EXCISION BENIGN RIGHT NECK MASS;  Surgeon: Rozetta Nunnery, MD;  Location: [facility];  Service: ENT;  Laterality: Right;   MASS EXCISION Bilateral 03/23/2019   Procedure: EXCISIONS OF RIGHT CHEEK LESION, RIGHT POSTERIOR NECK LIPOMA  AND LEFT NECK NODULE;  Surgeon: Rozetta Nunnery, MD;  Location: [facility];  Service: ENT;  Laterality: Bilateral;   NASAL FRACTURE SURGERY     TOTAL KNEE ARTHROPLASTY Right 06/01/2016   TOTAL KNEE ARTHROPLASTY Right 06/01/2016   Procedure: TOTAL KNEE ARTHROPLASTY;  Surgeon: [name], MD;  Location: [facility];  Service: Orthopedics;  Laterality: Right;   TOTAL KNEE ARTHROPLASTY Left 07/25/2018   Procedure: LEFT TOTAL KNEE ARTHROPLASTY;  Surgeon: [name], MD;  Location: [facility];  Service: Orthopedics;  Laterality: Left;   Social History   Occupational History   Not on file  Tobacco Use   Smoking status: Never Smoker   Smokeless tobacco: Never Used  Substance and Sexual Activity   Alcohol use: Yes    Comment: rarely   Drug use: No   Sexual activity: Not on file

## 2019-05-18 ENCOUNTER — Encounter: Payer: Self-pay | Admitting: Orthopaedic Surgery

## 2019-05-18 ENCOUNTER — Telehealth: Payer: Self-pay | Admitting: Orthopaedic Surgery

## 2019-05-18 MED FILL — TESTOSTERONE CYP 200 MG/ML: 200 | 28 days supply | Qty: 4 | Fill #1

## 2019-05-18 NOTE — Telephone Encounter (Signed)
I contacted pt explained to pt the order was put in as routine not urgent but will work on to get in quickly. I asked if he was ok with going to [facility] if availibility and he agreed, I contacted Hoyle Sauer there and she is going to see if can work him in if not will try contacting [place] with [facility] to work in with this weekend no later then Mon. Pt agreed and will wait on call for appt.

## 2019-05-18 NOTE — Addendum Note (Signed)
Addended by: Daylene Posey T on: 05/18/2019 11:01 AM   Modules accepted: Orders

## 2019-05-18 NOTE — Telephone Encounter (Signed)
What's the status of this referral? Thanks!

## 2019-05-18 NOTE — Telephone Encounter (Signed)
Patient called in regards to his MRI and wanted to why he had not heard anything about an appointment.  He was under the impression that it was going to be an urgent referral.  CB#[phone].  Thank you.

## 2019-05-19 ENCOUNTER — Other Ambulatory Visit: Payer: Self-pay

## 2019-05-19 ENCOUNTER — Ambulatory Visit (HOSPITAL_BASED_OUTPATIENT_CLINIC_OR_DEPARTMENT_OTHER)
Admission: RE | Admit: 2019-05-19 | Discharge: 2019-05-19 | Disposition: A | Discharge status: Home / Self Care | Payer: 59 | Source: Ambulatory Visit | Attending: Orthopaedic Surgery | Admitting: Orthopaedic Surgery

## 2019-05-19 DIAGNOSIS — M545 Low back pain: Secondary | ICD-10-CM | POA: Diagnosis not present

## 2019-05-19 DIAGNOSIS — G8929 Other chronic pain: Secondary | ICD-10-CM | POA: Diagnosis not present

## 2019-05-31 ENCOUNTER — Ambulatory Visit (INDEPENDENT_AMBULATORY_CARE_PROVIDER_SITE_OTHER): Payer: 59 | Admitting: Orthopaedic Surgery

## 2019-05-31 ENCOUNTER — Other Ambulatory Visit: Payer: Self-pay | Admitting: Orthopaedic Surgery

## 2019-05-31 ENCOUNTER — Other Ambulatory Visit: Payer: Self-pay

## 2019-05-31 ENCOUNTER — Encounter: Payer: Self-pay | Admitting: Orthopaedic Surgery

## 2019-05-31 VITALS — Ht 69.0 in | Wt 233.0 lb

## 2019-05-31 DIAGNOSIS — M5442 Lumbago with sciatica, left side: Secondary | ICD-10-CM | POA: Diagnosis not present

## 2019-05-31 MED ORDER — METHOCARBAMOL 500 MG PO TABS: ORAL_TABLET | ORAL | 0 refills | Status: DC

## 2019-05-31 MED ORDER — TRAMADOL HCL 50 MG PO TABS: ORAL_TABLET | ORAL | 0 refills | Status: DC

## 2019-05-31 MED FILL — METHOCARBAMOL 500 MG TABS: 500 | 10 days supply | Qty: 20 | Fill #0

## 2019-05-31 MED FILL — traMADol HCL 50 MG TABS: 50 | 15 days supply | Qty: 30 | Fill #0

## 2019-05-31 NOTE — Progress Notes (Signed)
Office Visit Note   Patient: James Gallegos           Date of Birth: October 07, 1950           MRN: [id number] Visit Date: 05/31/2019              Requested by: Eunice Blase, MD [address] [place],  [place] [address] PCP: Eunice Blase, MD   Assessment & Plan: Visit Diagnoses:  1. Acute left-sided low back pain with left-sided sciatica     Plan: MRI scan demonstrates degenerative disc disease and degenerative facet disease throughout the lower thoracic and lumbar regions.  There is no evidence of compressive stenosis at any level.  Mr. Collard is having predominately back pain on an episodic basis.  He is not having any present radiculopathy.  Long discussion regarding his diagnosis I think a course of physical therapy would be worthwhile I will renew his prescription for Robaxin give him a prescription for tramadol that he did use very sparingly.  Reevaluate in the over the next 6 to 8 weeks  Follow-Up Instructions: Return if symptoms worsen or fail to improve.   Orders:  Orders Placed This Encounter  Procedures  . Ambulatory referral to Physical Therapy   Meds ordered this encounter  Medications  . methocarbamol (ROBAXIN) 500 MG tablet    Sig: 1 PO BID prn    Dispense:  20 tablet    Refill:  0  . traMADol (ULTRAM) 50 MG tablet    Sig: 1 PO BID prn pain    Dispense:  30 tablet    Refill:  0      Procedures: No procedures performed   Clinical Data: No additional findings.   Subjective: Chief Complaint  Patient presents with  . Lower Back - Follow-up    MRI review  Patient presents today for follow up on her lower back. He had an MRI of his lower back on 05/19/2019. He states that his back is still hurting. No changes. He has tried OTC pain medicine, but nothing helps.  No flank pain.  No shortness of breath or chest pain.  On occasion has had buttock pain but nothing referred to either lower extremity.  No change in bowel or bladder function.  HPI  Review  of Systems   Objective: Vital Signs: Ht 5\' 9"  (1.753 m)   Wt 233 lb (105.7 kg)   BMI 34.41 kg/m   Physical Exam Constitutional:      Appearance: He is well-developed.  Eyes:     Pupils: Pupils are equal, round, and reactive to light.  Pulmonary:     Effort: Pulmonary effort is normal.  Skin:    General: Skin is warm and dry.  Neurological:     Mental Status: He is alert and oriented to person, place, and time.  Psychiatric:        Behavior: Behavior normal.     Ortho Exam awake alert and oriented x3.  Comfortable sitting.  Straight leg raise negative.  Painless range of motion both hips.  Has had bilateral knee replacements and doing well from that standpoint.  Motor and sensory exam intact.  Did have some areas of percussible tenderness in the midline of the lumbar spine and lower thoracic area couple areas of trigger point tenderness in the parascapular region bilaterally.  No neck pain no flank pain.  Specialty Comments:  No specialty comments available.  Imaging: No results found.   PMFS History: Patient Active Problem List  Diagnosis Date Noted  . Class 2 obesity due to excess calories without serious comorbidity with body mass index (BMI) of 39.0 to 39.9 in adult 01/08/2019  . Pain in right elbow 12/06/2018  . Primary osteoarthritis of left knee 07/25/2018  . History of left knee replacement 07/25/2018  . Right foot pain 06/22/2018  . Acute left-sided low back pain with left-sided sciatica 06/22/2018  . Osteoarthritis 11/21/2017  . Hyperlipidemia 11/21/2017  . Low testosterone 09/26/2016  . Healthcare maintenance 09/26/2016  . S/P total knee replacement using cement, right 06/01/2016  . Sleep apnea 05/19/2016  . Hypertension 05/19/2016   Past Medical History:  Diagnosis Date  . Arthritis    back & L knee, R foot - lis franc   . Deep vein thrombosis ([place])    after knee surgery in 2004  . Hypertension   . Murmur, cardiac    pt. reports its difficult to  auscultate   . Sleep apnea    CPAP- last study- 2014    Family History  Problem Relation Age of Onset  . Alzheimer's disease Father   . Colon cancer Neg Hx   . Esophageal cancer Neg Hx   . Rectal cancer Neg Hx   . Stomach cancer Neg Hx     Past Surgical History:  Procedure Laterality Date  . CERVICAL SPINE SURGERY N/A 1990   C5-6 discectomy  . FINGER SURGERY Right 2008   x 3 index finger  . KNEE ARTHROPLASTY    . KNEE ARTHROSCOPY Bilateral    L 2004, R 2005  . MASS EXCISION Right 01/18/2017   Procedure: EXCISION BENIGN RIGHT NECK MASS;  Surgeon: Rozetta Nunnery, MD;  Location: [facility];  Service: ENT;  Laterality: Right;  . MASS EXCISION Bilateral 03/23/2019   Procedure: EXCISIONS OF RIGHT CHEEK LESION, RIGHT POSTERIOR NECK LIPOMA  AND LEFT NECK NODULE;  Surgeon: Rozetta Nunnery, MD;  Location: [facility];  Service: ENT;  Laterality: Bilateral;  . NASAL FRACTURE SURGERY    . 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 TOTAL KNEE ARTHROPLASTY Right 06/01/2016   Procedure: TOTAL KNEE ARTHROPLASTY;  Surgeon: [name], MD;  Location: [facility];  Service: Orthopedics;  Laterality: Right;  . TOTAL KNEE ARTHROPLASTY Left 07/25/2018   Procedure: LEFT TOTAL KNEE ARTHROPLASTY;  Surgeon: [name], MD;  Location: [facility];  Service: Orthopedics;  Laterality: Left;   Social History   Occupational History  . Not on file  Tobacco Use  . Smoking status: Never Smoker  . Smokeless tobacco: Never Used  Substance and Sexual Activity  . Alcohol use: Yes    Comment: rarely  . Drug use: No  . Sexual activity: Not on file

## 2019-06-27 ENCOUNTER — Other Ambulatory Visit: Payer: Self-pay

## 2019-06-27 ENCOUNTER — Encounter: Payer: Self-pay | Admitting: Physical Therapy

## 2019-06-27 ENCOUNTER — Ambulatory Visit (INDEPENDENT_AMBULATORY_CARE_PROVIDER_SITE_OTHER): Payer: 59 | Admitting: Physical Therapy

## 2019-06-27 DIAGNOSIS — M25662 Stiffness of left knee, not elsewhere classified: Secondary | ICD-10-CM | POA: Diagnosis not present

## 2019-06-27 DIAGNOSIS — M25562 Pain in left knee: Secondary | ICD-10-CM

## 2019-06-27 DIAGNOSIS — G8929 Other chronic pain: Secondary | ICD-10-CM

## 2019-06-27 DIAGNOSIS — M545 Low back pain, unspecified: Secondary | ICD-10-CM

## 2019-06-27 DIAGNOSIS — M542 Cervicalgia: Secondary | ICD-10-CM

## 2019-06-27 NOTE — Patient Instructions (Signed)
Access Code: [email or web address]  URL: [email or web address]  Date: 06/27/2019  Prepared by: Elsie Ra   Exercises  Single Knee to Chest Stretch - 2 reps - 1 sets - 30 hold - 2x daily - 6x weekly  Supine Lower Trunk Rotation - 10 reps - 1 sets - 5 sec hold - 2x daily - 6x weekly  Supine Bridge - 10 reps - 1-3 sets - 2x daily - 6x weekly  Child's Pose Stretch - 3 sets - 30 hold - 2x daily - 6x weekly  Static Prone on Elbows - 3 sets - 30 to 60 sec hold - 2x daily - 6x weekly  Bird Dog - 10 reps - 1 sets - 5 sec hold - 2x daily - 6x weekly  Seated Hamstring Stretch - 2 reps - 1 sets - 30 hold - 2x daily - 6x weekly  Slump Stretch - 10 reps - 1-2 sets - 3 hold - 2x daily - 6x weekly  Seated Cervical Retraction - 10 reps - 2-3 sets - 2x daily - 6x weekly  Seated Cervical Sidebending Stretch - 2-3 reps - 1 sets - 30 sec hold - 2x daily - 6x weekly  Seated Assisted Cervical Rotation with Towel - 10 reps - 3 sets - 2x daily - 6x weekly

## 2019-06-27 NOTE — Therapy (Signed)
[facility] [address] [place], [place], [address] Phone: [phone]   Fax:  [phone]  Physical Therapy Evaluation  Patient Details  Name: James Gallegos MRN: [id number] Date of Birth: April 13, 1951 Referring Provider (PT): Durward Fortes   Encounter Date: 06/27/2019  PT End of Session - 06/27/19 [id number]    Visit Number  1    Number of Visits  12    Date for PT Re-Evaluation  08/08/19    Authorization Type  [facility]    PT Start Time  0805    PT Stop Time  0850    PT Time Calculation (min)  45 min    Activity Tolerance  Patient tolerated treatment well    Behavior During Therapy  [facility] for tasks assessed/performed       Past Medical History:  Diagnosis Date  . Arthritis    back & L knee, R foot - lis franc   . Deep vein thrombosis ([place])    after knee surgery in 2004  . Hypertension   . Murmur, cardiac    pt. reports its difficult to auscultate   . Sleep apnea    CPAP- last study- 2014    Past Surgical History:  Procedure Laterality Date  . CERVICAL SPINE SURGERY N/A 1990   C5-6 discectomy  . FINGER SURGERY Right 2008   x 3 index finger  . KNEE ARTHROPLASTY    . KNEE ARTHROSCOPY Bilateral    L 2004, R 2005  . MASS EXCISION Right 01/18/2017   Procedure: EXCISION BENIGN RIGHT NECK MASS;  Surgeon: Rozetta Nunnery, MD;  Location: [facility];  Service: ENT;  Laterality: Right;  . MASS EXCISION Bilateral 03/23/2019   Procedure: EXCISIONS OF RIGHT CHEEK LESION, RIGHT POSTERIOR NECK LIPOMA  AND LEFT NECK NODULE;  Surgeon: Rozetta Nunnery, MD;  Location: [facility];  Service: ENT;  Laterality: Bilateral;  . NASAL FRACTURE SURGERY    . 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 TOTAL KNEE ARTHROPLASTY Right 06/01/2016   Procedure: TOTAL KNEE ARTHROPLASTY;  Surgeon: [name], MD;  Location: [facility];  Service: Orthopedics;  Laterality: Right;  . TOTAL KNEE ARTHROPLASTY Left 07/25/2018    Procedure: LEFT TOTAL KNEE ARTHROPLASTY;  Surgeon: [name], MD;  Location: [facility];  Service: Orthopedics;  Laterality: Left;    There were no vitals filed for this visit.   Subjective Assessment - 06/27/19 0809    Subjective  Relays he had severe back pain that started in the end of november. "MRI scan demonstrates degenerative disc disease and degenerative facet disease throughout the lower thoracic and lumbar regions". He says the pain has now improved greatly and that the pain could have possibly been from kidney stone or from DDD. He says his biggest complaint today is chronic neck pain and Lt knee pain/stifness because his PT got cancelled after TKA due to covid    Pertinent History  PMH: lumbar DDD, previous neck fusion, sleep apnea, HTN, bilat TKA    Limitations  Standing;Walking    How long can you stand comfortably?  depends but sore and painful if he works all day    Diagnostic tests  MRI scan demonstrates degenerative disc disease and degenerative facet disease throughout the lower thoracic and lumbar regions    Patient Stated Goals  improve neck pain, improve knee ROM, be able to manage back pain if it returns    Currently in Pain?  Yes    Pain  Score  4     Pain Location  Neck    Pain Orientation  Right;Left    Pain Descriptors / Indicators  Aching;Tightness    Pain Type  Chronic pain    Pain Radiating Towards  sometimes down his Lt arm into his last fingers    Aggravating Factors   nothing specific    Pain Relieving Factors  rest    Multiple Pain Sites  --   not today but sometimes has back and Rt knee pain        [facility] PT Assessment - 06/27/19 0001      Assessment   Medical Diagnosis  acute low back pain, chronic neck pain,  knee pain/stffness S/P TKA    Referring Provider (PT)  [place]    Onset Date/Surgical Date  --   Lt knee TKA 07/2018, Rt knee TKA 2019   Next MD Visit  PRN    Prior Therapy  had only a some PT after TKA but then this got cancelled due  to COVID      Precautions   Precautions  None      Restrictions   Weight Bearing Restrictions  No      Balance Screen   Has the patient fallen in the past 6 months  No      Home Environment   Living Environment  Private residence      Prior Function   Level of Independence  Independent    Vocation Requirements  works as Hotel manager   Overall Cognitive Status  Within Functional Limits for tasks assessed      Sensation   Light Touch  Appears Intact      Coordination   Gross Motor Movements are Fluid and Coordinated  Yes      ROM / Strength   AROM / PROM / Strength  AROM;Strength;PROM      AROM   AROM Assessment Site  Knee;Cervical;Lumbar    Right/Left Knee  Left    Left Knee Extension  0    Left Knee Flexion  96    Cervical Flexion  50%    Cervical Extension  75%    Cervical - Right Side Bend  25%    Cervical - Left Side Bend  25%    Cervical - Right Rotation  75%    Cervical - Left Rotation  50%    Lumbar Flexion  75%    Lumbar Extension  75%    Lumbar - Right Side Bend  50%    Lumbar - Left Side Bend  50%    Lumbar - Right Rotation  50%    Lumbar - Left Rotation  50%      PROM   PROM Assessment Site  Knee    Right/Left Knee  Left    Left Knee Extension  0    Left Knee Flexion  101      Strength   Overall Strength Comments  UE strength and LE strength grossly 5/5 tested in sitting      Flexibility   Soft Tissue Assessment /Muscle Length  --   mod tight lumbar/cervical P.S, traps, H.S, quads bilat     Palpation   Spinal mobility  mildly decreased with no significant pain reported      Special Tests   Other special tests  + slump test or neural tension on Lt that was neg on Rt, neg SLR bilat, neg FABERS bilat  Transfers   Transfers  Independent with all Transfers      Ambulation/Gait   Gait Comments  WFL, does not use AD                Objective measurements completed on examination: See above findings.       [facility] Adult PT Treatment/Exercise - 06/27/19 0001      Modalities   Modalities  Moist Heat      Moist Heat Therapy   Number Minutes Moist Heat  10 Minutes    Moist Heat Location  Lumbar Spine             PT Education - 06/27/19 1404    Education Details  HEP, POC    Person(s) Educated  Patient    Methods  Explanation;Demonstration;Verbal cues;Handout    Comprehension  Verbalized understanding;Need further instruction          PT Long Term Goals - 06/27/19 1429      PT LONG TERM GOAL #1   Title  Patient will increase gross Lt knee flexion movement to 0-115 degrees without pain in order to get up and down off a low chair. ( [employer] for all goals 6 weeks 08/08/19)    Time  6    Period  Weeks    Status  New      PT LONG TERM GOAL #2   Title  Pt will improve lumbar/cervical ROM to [facility].    Time  6    Period  Weeks    Status  New      PT LONG TERM GOAL #3   Title  Patient will be independent with final HEP for LE strength and stability    Time  6    Period  Weeks    Status  New      PT LONG TERM GOAL #4   Title  Pt will reduce overall pain levels for neck, back, knee to less than 3/10 on avg with ususal activity.    Time  6    Period  Weeks    Status  New      PT LONG TERM GOAL #5   Title  Patient will go up/down 8 steps without increased pain in order to perfrom ADLs and get in/out of houses safely for work ( 09/28/2018)     Baseline  some difficuly going down due to ROM    Time  6    Period  Weeks    Status  New             Plan - 06/27/19 1406    Clinical Impression Statement  Pt referred to PT for acute low back pain but this appears to have mostly resolved and special testing negative for lumbar today and he had no back pain. He does complain of chronic neck pain S/P fusion, and chronic Lt knee pain and stiffness S/P TKA. He has overall decreased lumbar/neck/knee ROM, muscle tightness, and decreased activity tolerance. He will benefit from  skilled PT to address these deficits. PT will send plan of care to MD to authorize treating him for his neck and knee.    Personal Factors and Comorbidities  Comorbidity 2    Comorbidities  PMH: lumbar DDD, previous neck fusion, sleep apnea, HTN, bilat TKA    Examination-Activity Limitations  Bend;Squat;Stand;Lift    Examination-Participation Restrictions  Community Activity;Yard Work    Stability/Clinical Decision Making  Evolving/Moderate complexity    Clinical Decision Making  Moderate  Rehab Potential  Good    PT Frequency  2x / week    PT Duration  6 weeks    PT Treatment/Interventions  ADLs/Self Care Home Management;Aquatic Therapy;Cryotherapy;Electrical Stimulation;Moist Heat;Ultrasound;Therapeutic activities;Therapeutic exercise;Neuromuscular re-education;Manual techniques;Passive range of motion;Dry needling;Taping    PT Next Visit Plan  previous neck fusion, add more knee stretching into HEP, needs Knee ROM, and general strength/conditioning    PT Home Exercise Plan  Access Code: [email or web address]    Consulted and Agree with Plan of Care  Patient       Patient will benefit from skilled therapeutic intervention in order to improve the following deficits and impairments:  Decreased activity tolerance, Decreased endurance, Decreased range of motion, Decreased strength, Hypomobility, Increased muscle spasms, Impaired flexibility, Pain  Visit Diagnosis: Acute bilateral low back pain without sciatica  Cervicalgia  Chronic pain of left knee  Stiffness of left knee, not elsewhere classified     Problem List Patient Active Problem List   Diagnosis Date Noted  . Class 2 obesity due to excess calories without serious comorbidity with body mass index (BMI) of 39.0 to 39.9 in adult 01/08/2019  . Pain in right elbow 12/06/2018  . Primary osteoarthritis of left knee 07/25/2018  . History of left knee replacement 07/25/2018  . Right foot pain 06/22/2018  . Acute left-sided low back pain  with left-sided sciatica 06/22/2018  . Osteoarthritis 11/21/2017  . Hyperlipidemia 11/21/2017  . Low testosterone 09/26/2016  . Healthcare maintenance 09/26/2016  . S/P total knee replacement using cement, right 06/01/2016  . Sleep apnea 05/19/2016  . Hypertension 05/19/2016    James Gallegos 06/27/2019, 2:50 PM  [facility] [address] [place], [place], [address] Phone: [phone]   Fax:  [phone]  Name: James Gallegos MRN: [id number] Date of Birth: 15-May-1951

## 2019-06-29 MED FILL — LOSARTAN-HCTZ 100-25 MG TAB: 100-25 | 90 days supply | Qty: 90 | Fill #1

## 2019-07-09 ENCOUNTER — Encounter: Payer: Self-pay | Admitting: Family Medicine

## 2019-07-12 ENCOUNTER — Other Ambulatory Visit: Payer: Self-pay

## 2019-07-12 ENCOUNTER — Ambulatory Visit (INDEPENDENT_AMBULATORY_CARE_PROVIDER_SITE_OTHER): Payer: 59 | Admitting: Physical Therapy

## 2019-07-12 DIAGNOSIS — M25562 Pain in left knee: Secondary | ICD-10-CM

## 2019-07-12 DIAGNOSIS — M545 Low back pain, unspecified: Secondary | ICD-10-CM

## 2019-07-12 DIAGNOSIS — M25662 Stiffness of left knee, not elsewhere classified: Secondary | ICD-10-CM | POA: Diagnosis not present

## 2019-07-12 DIAGNOSIS — M6281 Muscle weakness (generalized): Secondary | ICD-10-CM | POA: Diagnosis not present

## 2019-07-12 DIAGNOSIS — G8929 Other chronic pain: Secondary | ICD-10-CM

## 2019-07-12 DIAGNOSIS — M542 Cervicalgia: Secondary | ICD-10-CM | POA: Diagnosis not present

## 2019-07-12 NOTE — Therapy (Signed)
[facility] [address] [place], [place], [address] Phone: [phone]   Fax:  [phone]  Physical Therapy Treatment  Patient Details  Name: James Gallegos MRN: [id number] Date of Birth: August 27, 1950 Referring Provider (PT): Durward Fortes   Encounter Date: 07/12/2019  PT End of Session - 07/12/19 1324    Visit Number  2    Date for PT Re-Evaluation  08/08/19    Authorization Type  [facility]    PT Start Time  1015    PT Stop Time  1100    PT Time Calculation (min)  45 min    Activity Tolerance  Patient tolerated treatment well    Behavior During Therapy  [facility] for tasks assessed/performed       Past Medical History:  Diagnosis Date  . Arthritis    back & L knee, R foot - lis franc   . Deep vein thrombosis ([place])    after knee surgery in 2004  . Hypertension   . Murmur, cardiac    pt. reports its difficult to auscultate   . Sleep apnea    CPAP- last study- 2014    Past Surgical History:  Procedure Laterality Date  . CERVICAL SPINE SURGERY N/A 1990   C5-6 discectomy  . FINGER SURGERY Right 2008   x 3 index finger  . KNEE ARTHROPLASTY    . KNEE ARTHROSCOPY Bilateral    L 2004, R 2005  . MASS EXCISION Right 01/18/2017   Procedure: EXCISION BENIGN RIGHT NECK MASS;  Surgeon: Rozetta Nunnery, MD;  Location: [place];  Service: ENT;  Laterality: Right;  . MASS EXCISION Bilateral 03/23/2019   Procedure: EXCISIONS OF RIGHT CHEEK LESION, RIGHT POSTERIOR NECK LIPOMA  AND LEFT NECK NODULE;  Surgeon: Rozetta Nunnery, MD;  Location: [place];  Service: ENT;  Laterality: Bilateral;  . NASAL FRACTURE SURGERY    . 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 TOTAL KNEE ARTHROPLASTY Right 06/01/2016   Procedure: TOTAL KNEE ARTHROPLASTY;  Surgeon: [name], MD;  Location: [place];  Service: Orthopedics;  Laterality: Right;  . TOTAL KNEE ARTHROPLASTY Left 07/25/2018   Procedure: LEFT TOTAL KNEE  ARTHROPLASTY;  Surgeon: [name], MD;  Location: [place];  Service: Orthopedics;  Laterality: Left;    There were no vitals filed for this visit.  Subjective Assessment - 07/12/19 1320    Subjective  Relays his back is just achy, but his neck pain is a lot worse today and very stiff. He still relays N/T in his last 2 fingers and wonders if he should see neurosurgeon about his neck    Pertinent History  PMH: lumbar DDD, previous neck fusion, sleep apnea, HTN, bilat TKA    Limitations  Standing;Walking    How long can you stand comfortably?  depends but sore and painful if he works all day    Diagnostic tests  MRI scan demonstrates degenerative disc disease and degenerative facet disease throughout the lower thoracic and lumbar regions    Patient Stated Goals  improve neck pain, improve knee ROM, be able to manage back pain if it returns                       [facility] Adult PT Treatment/Exercise - 07/12/19 0001      Exercises   Exercises  Neck      Neck Exercises: Seated   Other Seated Exercise  upper trap stretch 30 sec X  2 on Lt      Neck Exercises: Supine   Neck Retraction Limitations  5 reps but painful so stopped      Modalities   Modalities  Moist Heat;Electrical Stimulation      Moist Heat Therapy   Number Minutes Moist Heat  15 Minutes    Moist Heat Location  Lumbar Spine      Electrical Stimulation   Electrical Stimulation Location  neck and Lt upper trap    Electrical Stimulation Action  IFC    Electrical Stimulation Parameters  toerance for 15 min    Electrical Stimulation Goals  Tone;Pain      Manual Therapy   Manual therapy comments  PROM to neck, gentle cervical distraction, STM/IASTM to cervical paraspinals and Lt upper trap             PT Education - 07/12/19 1323    Education Details  agree that he would benefit from consult from his neurosurgeion if PT is not able to improve his radiculopathy in a few more sessions, education for  TENS    Person(s) Educated  Patient    Methods  Explanation    Comprehension  Verbalized understanding          PT Long Term Goals - 06/27/19 1429      PT LONG TERM GOAL #1   Title  Patient will increase gross Lt knee flexion movement to 0-115 degrees without pain in order to get up and down off a low chair. ( Target for all goals 6 weeks 08/08/19)    Time  6    Period  Weeks    Status  New      PT LONG TERM GOAL #2   Title  Pt will improve lumbar/cervical ROM to [facility].    Time  6    Period  Weeks    Status  New      PT LONG TERM GOAL #3   Title  Patient will be independent with final HEP for LE strength and stability    Time  6    Period  Weeks    Status  New      PT LONG TERM GOAL #4   Title  Pt will reduce overall pain levels for neck, back, knee to less than 3/10 on avg with ususal activity.    Time  6    Period  Weeks    Status  New      PT LONG TERM GOAL #5   Title  Patient will go up/down 8 steps without increased pain in order to perfrom ADLs and get in/out of houses safely for work ( 09/28/2018)     Baseline  some difficuly going down due to ROM    Time  6    Period  Weeks    Status  New            Plan - 07/12/19 1325    Clinical Impression Statement  Due to neck pain as his chief complaint sesion was spent trying to decrease neck pain and stiffness with manual therapy and stretching, combined with MHP and TENS. He does relay less pain and stiffness following session however he is still having N/T in his last 2 digits. PT will try a few more sessions and if no improvements will recommend he have consult with his neurosurgion.    Personal Factors and Comorbidities  Comorbidity 2    Comorbidities  PMH: lumbar DDD, previous neck fusion, sleep apnea,  HTN, bilat TKA    Examination-Activity Limitations  Bend;Squat;Stand;Lift    Examination-Participation Restrictions  Community Activity;Yard Work    [employer] officer  Evolving/Moderate  complexity    Rehab Potential  Good    PT Frequency  2x / week    PT Duration  6 weeks    PT Treatment/Interventions  ADLs/Self Care Home Management;Aquatic Therapy;Cryotherapy;Electrical Stimulation;Moist Heat;Ultrasound;Therapeutic activities;Therapeutic exercise;Neuromuscular re-education;Manual techniques;Passive range of motion;Dry needling;Taping    PT Next Visit Plan  previous neck fusion, add more knee stretching into HEP, needs Knee ROM, and general strength/conditioning    PT Home Exercise Plan  Access Code: [email or web address]    Consulted and Agree with Plan of Care  Patient       Patient will benefit from skilled therapeutic intervention in order to improve the following deficits and impairments:  Decreased activity tolerance, Decreased endurance, Decreased range of motion, Decreased strength, Hypomobility, Increased muscle spasms, Impaired flexibility, Pain  Visit Diagnosis: Acute bilateral low back pain without sciatica  Cervicalgia  Chronic pain of left knee  Stiffness of left knee, not elsewhere classified  Muscle weakness (generalized)     Problem List Patient Active Problem List   Diagnosis Date Noted  . Class 2 obesity due to excess calories without serious comorbidity with body mass index (BMI) of 39.0 to 39.9 in adult 01/08/2019  . Pain in right elbow 12/06/2018  . Primary osteoarthritis of left knee 07/25/2018  . History of left knee replacement 07/25/2018  . Right foot pain 06/22/2018  . Acute left-sided low back pain with left-sided sciatica 06/22/2018  . Osteoarthritis 11/21/2017  . Hyperlipidemia 11/21/2017  . Low testosterone 09/26/2016  . Healthcare maintenance 09/26/2016  . S/P total knee replacement using cement, right 06/01/2016  . Sleep apnea 05/19/2016  . Hypertension 05/19/2016    James Gallegos 07/12/2019, 1:34 PM  [facility] Physical Therapy [address] [place], [place], [address] Phone: [phone]   Fax:   [phone]  Name: James Gallegos MRN: [id number] Date of Birth: 01-Mar-1951

## 2019-07-17 ENCOUNTER — Other Ambulatory Visit: Payer: Self-pay

## 2019-07-17 ENCOUNTER — Ambulatory Visit (INDEPENDENT_AMBULATORY_CARE_PROVIDER_SITE_OTHER): Payer: 59 | Admitting: Physical Therapy

## 2019-07-17 DIAGNOSIS — M545 Low back pain, unspecified: Secondary | ICD-10-CM

## 2019-07-17 DIAGNOSIS — M542 Cervicalgia: Secondary | ICD-10-CM

## 2019-07-17 DIAGNOSIS — M25562 Pain in left knee: Secondary | ICD-10-CM

## 2019-07-17 DIAGNOSIS — M6281 Muscle weakness (generalized): Secondary | ICD-10-CM | POA: Diagnosis not present

## 2019-07-17 DIAGNOSIS — G8929 Other chronic pain: Secondary | ICD-10-CM | POA: Diagnosis not present

## 2019-07-17 DIAGNOSIS — M25662 Stiffness of left knee, not elsewhere classified: Secondary | ICD-10-CM

## 2019-07-17 NOTE — Therapy (Signed)
[facility] [address] [place], [place], [address] Phone: [phone]   Fax:  [phone]  Physical Therapy Treatment  Patient Details  Name: James Gallegos MRN: [id number] Date of Birth: 1950/07/14 Referring Provider (PT): Durward Fortes   Encounter Date: 07/17/2019  PT End of Session - 07/17/19 1108    Visit Number  3    Number of Visits  12    Date for PT Re-Evaluation  08/08/19    Authorization Type  [facility]    PT Start Time  0845    PT Stop Time  0930    PT Time Calculation (min)  45 min    Activity Tolerance  Patient tolerated treatment well    Behavior During Therapy  [facility] for tasks assessed/performed       Past Medical History:  Diagnosis Date  . Arthritis    back & L knee, R foot - lis franc   . Deep vein thrombosis ([place])    after knee surgery in 2004  . Hypertension   . Murmur, cardiac    pt. reports its difficult to auscultate   . Sleep apnea    CPAP- last study- 2014    Past Surgical History:  Procedure Laterality Date  . CERVICAL SPINE SURGERY N/A 1990   C5-6 discectomy  . FINGER SURGERY Right 2008   x 3 index finger  . KNEE ARTHROPLASTY    . KNEE ARTHROSCOPY Bilateral    L 2004, R 2005  . MASS EXCISION Right 01/18/2017   Procedure: EXCISION BENIGN RIGHT NECK MASS;  Surgeon: Rozetta Nunnery, MD;  Location: [place];  Service: ENT;  Laterality: Right;  . MASS EXCISION Bilateral 03/23/2019   Procedure: EXCISIONS OF RIGHT CHEEK LESION, RIGHT POSTERIOR NECK LIPOMA  AND LEFT NECK NODULE;  Surgeon: Rozetta Nunnery, MD;  Location: [place];  Service: ENT;  Laterality: Bilateral;  . NASAL FRACTURE SURGERY    . 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 TOTAL KNEE ARTHROPLASTY Right 06/01/2016   Procedure: TOTAL KNEE ARTHROPLASTY;  Surgeon: [name], MD;  Location: [place];  Service: Orthopedics;  Laterality: Right;  . TOTAL KNEE ARTHROPLASTY Left 07/25/2018   Procedure:  LEFT TOTAL KNEE ARTHROPLASTY;  Surgeon: [name], MD;  Location: [place];  Service: Orthopedics;  Laterality: Left;    There were no vitals filed for this visit.  Subjective Assessment - 07/17/19 1004    Subjective  Relays back and knee feel okay but neck is again his chief complaint with N/T in his last 2 fingers. This is a little better than last time but still really bothering him.        [place] Adult PT Treatment/Exercise - 07/17/19 0001      Neck Exercises: Seated   Other Seated Exercise  upper trap stretch 30 sec X 3 on Lt and X 1 on Rt, Levator stretch  30 sec X 2 for for Lt side (moving to Rt). Seated T extensions over towel 10 sec X 10 reps     Modalities   Modalities  Moist Heat;Electrical Stimulation      Moist Heat Therapy   Number Minutes Moist Heat  15 Minutes    Moist Heat Location  Cervical      Electrical Stimulation   Electrical Stimulation Location  neck and Lt upper trap    Electrical Stimulation Action  IFC    Electrical Stimulation Parameters  toelrance 15 min    Electrical Stimulation  Goals  Tone;Pain      Manual Therapy   Manual therapy comments  in prone, central PA mobs C7-T5 (C7-T1 did appear to provoke radiculopathy)                  PT Long Term Goals - 06/27/19 1429      PT LONG TERM GOAL #1   Title  Patient will increase gross Lt knee flexion movement to 0-115 degrees without pain in order to get up and down off a low chair. ( Target for all goals 6 weeks 08/08/19)    Time  6    Period  Weeks    Status  New      PT LONG TERM GOAL #2   Title  Pt will improve lumbar/cervical ROM to [facility].    Time  6    Period  Weeks    Status  New      PT LONG TERM GOAL #3   Title  Patient will be independent with final HEP for LE strength and stability    Time  6    Period  Weeks    Status  New      PT LONG TERM GOAL #4   Title  Pt will reduce overall pain levels for neck, back, knee to less than 3/10 on avg with ususal activity.     Time  6    Period  Weeks    Status  New      PT LONG TERM GOAL #5   Title  Patient will go up/down 8 steps without increased pain in order to perfrom ADLs and get in/out of houses safely for work ( 09/28/2018)     Baseline  some difficuly going down due to ROM    Time  6    Period  Weeks    Status  New            Plan - 07/17/19 1109    Clinical Impression Statement  Neck pain and radiculopathy again his chief complaint so focused session on this. He does have some radicular symptoms provoked with C7-T1 with central P-A mobs. He is improving with overall tightness and stiffness in his neck but continues to have radiculopathy so PT does recommend he have consult with his neurosurgeon.    Personal Factors and Comorbidities  Comorbidity 2    Comorbidities  PMH: lumbar DDD, previous neck fusion, sleep apnea, HTN, bilat TKA    Examination-Activity Limitations  Bend;Squat;Stand;Lift    Examination-Participation Restrictions  Community Activity;Yard Work    [employer] officer  Evolving/Moderate complexity    Rehab Potential  Good    PT Frequency  2x / week    PT Duration  6 weeks    PT Treatment/Interventions  ADLs/Self Care Home Management;Aquatic Therapy;Cryotherapy;Electrical Stimulation;Moist Heat;Ultrasound;Therapeutic activities;Therapeutic exercise;Neuromuscular re-education;Manual techniques;Passive range of motion;Dry needling;Taping    PT Next Visit Plan  previous neck fusion, add more knee stretching into HEP, needs Knee ROM, and general strength/conditioning    PT Home Exercise Plan  Access Code: [email or web address]    Consulted and Agree with Plan of Care  Patient       Patient will benefit from skilled therapeutic intervention in order to improve the following deficits and impairments:  Decreased activity tolerance, Decreased endurance, Decreased range of motion, Decreased strength, Hypomobility, Increased muscle spasms, Impaired flexibility, Pain  Visit  Diagnosis: Acute bilateral low back pain without sciatica  Cervicalgia  Chronic pain of left knee  Stiffness of left  knee, not elsewhere classified  Muscle weakness (generalized)     Problem List Patient Active Problem List   Diagnosis Date Noted  . Class 2 obesity due to excess calories without serious comorbidity with body mass index (BMI) of 39.0 to 39.9 in adult 01/08/2019  . Pain in right elbow 12/06/2018  . Primary osteoarthritis of left knee 07/25/2018  . History of left knee replacement 07/25/2018  . Right foot pain 06/22/2018  . Acute left-sided low back pain with left-sided sciatica 06/22/2018  . Osteoarthritis 11/21/2017  . Hyperlipidemia 11/21/2017  . Low testosterone 09/26/2016  . Healthcare maintenance 09/26/2016  . S/P total knee replacement using cement, right 06/01/2016  . Sleep apnea 05/19/2016  . Hypertension 05/19/2016    Silvestre Mesi 07/17/2019, 11:16 AM  [facility] Physical Therapy [address] [place], [place], [address] Phone: [phone]   Fax:  [phone]  Name: James Gallegos MRN: [id number] Date of Birth: 10-Oct-1950

## 2019-07-20 ENCOUNTER — Ambulatory Visit (INDEPENDENT_AMBULATORY_CARE_PROVIDER_SITE_OTHER): Payer: 59 | Admitting: Physical Therapy

## 2019-07-20 ENCOUNTER — Other Ambulatory Visit: Payer: Self-pay

## 2019-07-20 DIAGNOSIS — M545 Low back pain, unspecified: Secondary | ICD-10-CM

## 2019-07-20 DIAGNOSIS — M6281 Muscle weakness (generalized): Secondary | ICD-10-CM | POA: Diagnosis not present

## 2019-07-20 DIAGNOSIS — M5030 Other cervical disc degeneration, unspecified cervical region: Secondary | ICD-10-CM | POA: Diagnosis not present

## 2019-07-20 DIAGNOSIS — G8929 Other chronic pain: Secondary | ICD-10-CM | POA: Diagnosis not present

## 2019-07-20 DIAGNOSIS — M25562 Pain in left knee: Secondary | ICD-10-CM

## 2019-07-20 DIAGNOSIS — M542 Cervicalgia: Secondary | ICD-10-CM

## 2019-07-20 DIAGNOSIS — M25662 Stiffness of left knee, not elsewhere classified: Secondary | ICD-10-CM | POA: Diagnosis not present

## 2019-07-20 DIAGNOSIS — M5412 Radiculopathy, cervical region: Secondary | ICD-10-CM | POA: Diagnosis not present

## 2019-07-20 DIAGNOSIS — Z981 Arthrodesis status: Secondary | ICD-10-CM | POA: Diagnosis not present

## 2019-07-20 DIAGNOSIS — M4722 Other spondylosis with radiculopathy, cervical region: Secondary | ICD-10-CM | POA: Diagnosis not present

## 2019-07-20 DIAGNOSIS — R29898 Other symptoms and signs involving the musculoskeletal system: Secondary | ICD-10-CM | POA: Diagnosis not present

## 2019-07-20 NOTE — Therapy (Signed)
[facility] Physical Therapy [address] [place], [place], [address] Phone: [phone]   Fax:  [phone]  Physical Therapy Treatment  Patient Details  Name: James Gallegos MRN: [id number] Date of Birth: [birth year] Referring Provider (PT): Durward Fortes   Encounter Date: 07/20/2019  PT End of Session - 07/20/19 1312    Visit Number  4    Number of Visits  12    Date for PT Re-Evaluation  08/08/19    Authorization Type  [facility]    PT Start Time  0810    PT Stop Time  0900    PT Time Calculation (min)  50 min    Activity Tolerance  Patient tolerated treatment well    Behavior During Therapy  [facility] for tasks assessed/performed       Past Medical History:  Diagnosis Date  . Arthritis    back & L knee, R foot - lis franc   . Deep vein thrombosis ([place])    after knee surgery in 2004  . Hypertension   . Murmur, cardiac    pt. reports its difficult to auscultate   . Sleep apnea    CPAP- last study- 2014    Past Surgical History:  Procedure Laterality Date  . CERVICAL SPINE SURGERY N/A 1990   C5-6 discectomy  . FINGER SURGERY Right 2008   x 3 index finger  . KNEE ARTHROPLASTY    . KNEE ARTHROSCOPY Bilateral    L 2004, R 2005  . MASS EXCISION Right 01/18/2017   Procedure: EXCISION BENIGN RIGHT NECK MASS;  Surgeon: Rozetta Nunnery, MD;  Location: [place];  Service: ENT;  Laterality: Right;  . MASS EXCISION Bilateral 03/23/2019   Procedure: EXCISIONS OF RIGHT CHEEK LESION, RIGHT POSTERIOR NECK LIPOMA  AND LEFT NECK NODULE;  Surgeon: Rozetta Nunnery, MD;  Location: [place];  Service: ENT;  Laterality: Bilateral;  . NASAL FRACTURE SURGERY    . 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 TOTAL KNEE ARTHROPLASTY Right 06/01/2016   Procedure: TOTAL KNEE ARTHROPLASTY;  Surgeon: [name], MD;  Location: [place];  Service: Orthopedics;  Laterality: Right;  . TOTAL KNEE ARTHROPLASTY Left 07/25/2018   Procedure:  LEFT TOTAL KNEE ARTHROPLASTY;  Surgeon: [name], MD;  Location: [place];  Service: Orthopedics;  Laterality: Left;    There were no vitals filed for this visit.  Subjective Assessment - 07/20/19 1308    Subjective  relays he will see neurosurgeon today due to continued neck pain with radiculopathy.    Pertinent History  PMH: lumbar DDD, previous neck fusion, sleep apnea, HTN, bilat TKA    Limitations  Standing;Walking    How long can you stand comfortably?  depends but sore and painful if he works all day    Diagnostic tests  MRI scan demonstrates degenerative disc disease and degenerative facet disease throughout the lower thoracic and lumbar regions    Patient Stated Goals  improve neck pain, improve knee ROM, be able to manage back pain if it returns                       [facility] Adult PT Treatment/Exercise - 07/20/19 0001      Neck Exercises: Seated   Lateral Flexion  10 reps;Right;Left    Other Seated Exercise  upper trap stretch 30 sec X 3 on Lt and X 1 on Rt, Levator stretch  30 sec X 2 for for Lt side (  moving to Rt). Seated T extensions over towel       Modalities   Modalities  Moist Heat;Electrical Stimulation      Moist Heat Therapy   Number Minutes Moist Heat  15 Minutes    Moist Heat Location  Cervical      Electrical Stimulation   Electrical Stimulation Location  neck and Lt upper trap    Electrical Stimulation Action  IFC    Electrical Stimulation Parameters  tolerance 15 min    Electrical Stimulation Goals  Tone;Pain      Manual Therapy   Manual therapy comments  in prone, central PA mobs C7-T5 (C7-T1 central and unilat on Lt did appear to provoke radiculopathy) In supine for S.O. relase, T.P. relase, gentle distraction, lateral glides                  PT Long Term Goals - 06/27/19 1429      PT LONG TERM GOAL #1   Title  Patient will increase gross Lt knee flexion movement to 0-115 degrees without pain in order to get up and  down off a low chair. ( Target for all goals 6 weeks 08/08/19)    Time  6    Period  Weeks    Status  New      PT LONG TERM GOAL #2   Title  Pt will improve lumbar/cervical ROM to [facility].    Time  6    Period  Weeks    Status  New      PT LONG TERM GOAL #3   Title  Patient will be independent with final HEP for LE strength and stability    Time  6    Period  Weeks    Status  New      PT LONG TERM GOAL #4   Title  Pt will reduce overall pain levels for neck, back, knee to less than 3/10 on avg with ususal activity.    Time  6    Period  Weeks    Status  New      PT LONG TERM GOAL #5   Title  Patient will go up/down 8 steps without increased pain in order to perfrom ADLs and get in/out of houses safely for work ( 09/28/2018)     Baseline  some difficuly going down due to ROM    Time  6    Period  Weeks    Status  New            Plan - 07/20/19 1312    Clinical Impression Statement  Continues to have cervical radiculopathy in his left UE down to his left fingers that is provoked with C7-T1 mobs and not provoked with elbow special testing. PT suspects disc herniation and will await further examination from neurosurgeon he will see today.    Personal Factors and Comorbidities  Comorbidity 2    Comorbidities  PMH: lumbar DDD, previous neck fusion, sleep apnea, HTN, bilat TKA    Examination-Activity Limitations  Bend;Squat;Stand;Lift    Examination-Participation Restrictions  Community Activity;Yard Work    [employer] officer  Evolving/Moderate complexity    Rehab Potential  Good    PT Frequency  2x / week    PT Duration  6 weeks    PT Treatment/Interventions  ADLs/Self Care Home Management;Aquatic Therapy;Cryotherapy;Electrical Stimulation;Moist Heat;Ultrasound;Therapeutic activities;Therapeutic exercise;Neuromuscular re-education;Manual techniques;Passive range of motion;Dry needling;Taping    PT Next Visit Plan  previous neck fusion, add more knee stretching  into HEP, needs Knee ROM, and general strength/conditioning    PT Home Exercise Plan  Access Code: [email or web address]    Consulted and Agree with Plan of Care  Patient       Patient will benefit from skilled therapeutic intervention in order to improve the following deficits and impairments:  Decreased activity tolerance, Decreased endurance, Decreased range of motion, Decreased strength, Hypomobility, Increased muscle spasms, Impaired flexibility, Pain  Visit Diagnosis: Acute bilateral low back pain without sciatica  Cervicalgia  Chronic pain of left knee  Stiffness of left knee, not elsewhere classified  Muscle weakness (generalized)     Problem List Patient Active Problem List   Diagnosis Date Noted  . Class 2 obesity due to excess calories without serious comorbidity with body mass index (BMI) of 39.0 to 39.9 in adult 01/08/2019  . Pain in right elbow 12/06/2018  . Primary osteoarthritis of left knee 07/25/2018  . History of left knee replacement 07/25/2018  . Right foot pain 06/22/2018  . Acute left-sided low back pain with left-sided sciatica 06/22/2018  . Osteoarthritis 11/21/2017  . Hyperlipidemia 11/21/2017  . Low testosterone 09/26/2016  . Healthcare maintenance 09/26/2016  . S/P total knee replacement using cement, right 06/01/2016  . Sleep apnea 05/19/2016  . Hypertension 05/19/2016    Silvestre Mesi 07/20/2019, 1:15 PM  [facility] Physical Therapy [address] [place], [place], [address] Phone: [phone]   Fax:  [phone]  Name: James Gallegos MRN: [id number] Date of Birth: [birth year]

## 2019-07-23 ENCOUNTER — Encounter: Payer: Self-pay | Admitting: Physical Therapy

## 2019-07-24 ENCOUNTER — Encounter: Payer: 59 | Admitting: Physical Therapy

## 2019-07-24 DIAGNOSIS — M4722 Other spondylosis with radiculopathy, cervical region: Secondary | ICD-10-CM | POA: Diagnosis not present

## 2019-07-24 DIAGNOSIS — M50121 Cervical disc disorder at C4-C5 level with radiculopathy: Secondary | ICD-10-CM | POA: Diagnosis not present

## 2019-07-24 DIAGNOSIS — M4802 Spinal stenosis, cervical region: Secondary | ICD-10-CM | POA: Diagnosis not present

## 2019-07-25 DIAGNOSIS — Z6835 Body mass index (BMI) 35.0-35.9, adult: Secondary | ICD-10-CM | POA: Diagnosis not present

## 2019-07-25 DIAGNOSIS — G5622 Lesion of ulnar nerve, left upper limb: Secondary | ICD-10-CM | POA: Diagnosis not present

## 2019-07-25 DIAGNOSIS — M542 Cervicalgia: Secondary | ICD-10-CM | POA: Diagnosis not present

## 2019-07-25 DIAGNOSIS — Z981 Arthrodesis status: Secondary | ICD-10-CM | POA: Diagnosis not present

## 2019-07-25 DIAGNOSIS — M5030 Other cervical disc degeneration, unspecified cervical region: Secondary | ICD-10-CM | POA: Diagnosis not present

## 2019-07-25 DIAGNOSIS — M4722 Other spondylosis with radiculopathy, cervical region: Secondary | ICD-10-CM | POA: Diagnosis not present

## 2019-07-25 DIAGNOSIS — R030 Elevated blood-pressure reading, without diagnosis of hypertension: Secondary | ICD-10-CM | POA: Diagnosis not present

## 2019-07-25 DIAGNOSIS — R29898 Other symptoms and signs involving the musculoskeletal system: Secondary | ICD-10-CM | POA: Diagnosis not present

## 2019-07-25 DIAGNOSIS — M5412 Radiculopathy, cervical region: Secondary | ICD-10-CM | POA: Diagnosis not present

## 2019-07-31 ENCOUNTER — Encounter: Payer: Self-pay | Admitting: Physical Therapy

## 2019-07-31 ENCOUNTER — Other Ambulatory Visit: Payer: Self-pay

## 2019-07-31 ENCOUNTER — Ambulatory Visit (INDEPENDENT_AMBULATORY_CARE_PROVIDER_SITE_OTHER): Payer: 59 | Admitting: Physical Therapy

## 2019-07-31 DIAGNOSIS — M25562 Pain in left knee: Secondary | ICD-10-CM

## 2019-07-31 DIAGNOSIS — M6281 Muscle weakness (generalized): Secondary | ICD-10-CM | POA: Diagnosis not present

## 2019-07-31 DIAGNOSIS — M545 Low back pain, unspecified: Secondary | ICD-10-CM

## 2019-07-31 DIAGNOSIS — M25561 Pain in right knee: Secondary | ICD-10-CM | POA: Diagnosis not present

## 2019-07-31 DIAGNOSIS — M542 Cervicalgia: Secondary | ICD-10-CM

## 2019-07-31 DIAGNOSIS — M25662 Stiffness of left knee, not elsewhere classified: Secondary | ICD-10-CM | POA: Diagnosis not present

## 2019-07-31 DIAGNOSIS — G8929 Other chronic pain: Secondary | ICD-10-CM

## 2019-07-31 NOTE — Patient Instructions (Signed)
Access Code: [email or web address]  URL: [email or web address]  Date: 07/31/2019  Prepared by: Elsie Ra   Exercises  Seated Carpal Tunnel Tissue Massage - 3-5 reps - 60 hold - 2x daily - 6x weekly  Seated Wrist Prayer Stretch - 3 reps - 20-30 sec hold - 2x daily - 6x weekly  Doorway Pec Stretch at 60 Degrees Abduction with Arm Straight - 3 sets - 30 hold - 2x daily - 6x weekly  Hand AROM Tendon Gliding Series - 5 reps - 1 sets - 5 hold - 1x daily - 6x weekly

## 2019-07-31 NOTE — Therapy (Signed)
[facility] Physical Therapy [address] [place], [place], [address] Phone: [phone]   Fax:  [phone]  Physical Therapy Treatment  Patient Details  Name: James Gallegos MRN: [id number] Date of Birth: 1951-05-05 Referring Provider (PT): Durward Fortes   Encounter Date: 07/31/2019  PT End of Session - 07/31/19 1111    Visit Number  5    Number of Visits  12    Date for PT Re-Evaluation  08/08/19    Authorization Type  [facility]    PT Start Time  0845    PT Stop Time  0930    PT Time Calculation (min)  45 min    Activity Tolerance  Patient tolerated treatment well    Behavior During Therapy  [facility] for tasks assessed/performed       Past Medical History:  Diagnosis Date  . Arthritis    back & L knee, R foot - lis franc   . Deep vein thrombosis ([place])    after knee surgery in 2004  . Hypertension   . Murmur, cardiac    pt. reports its difficult to auscultate   . Sleep apnea    CPAP- last study- 2014    Past Surgical History:  Procedure Laterality Date  . CERVICAL SPINE SURGERY N/A 1990   C5-6 discectomy  . FINGER SURGERY Right 2008   x 3 index finger  . KNEE ARTHROPLASTY    . KNEE ARTHROSCOPY Bilateral    L 2004, R 2005  . MASS EXCISION Right 01/18/2017   Procedure: EXCISION BENIGN RIGHT NECK MASS;  Surgeon: Rozetta Nunnery, MD;  Location: [place];  Service: ENT;  Laterality: Right;  . MASS EXCISION Bilateral 03/23/2019   Procedure: EXCISIONS OF RIGHT CHEEK LESION, RIGHT POSTERIOR NECK LIPOMA  AND LEFT NECK NODULE;  Surgeon: Rozetta Nunnery, MD;  Location: [place];  Service: ENT;  Laterality: Bilateral;  . NASAL FRACTURE SURGERY    . 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 TOTAL KNEE ARTHROPLASTY Right 06/01/2016   Procedure: TOTAL KNEE ARTHROPLASTY;  Surgeon: [name], MD;  Location: [place];  Service: Orthopedics;  Laterality: Right;  . TOTAL KNEE ARTHROPLASTY Left 07/25/2018   Procedure:  LEFT TOTAL KNEE ARTHROPLASTY;  Surgeon: [name], MD;  Location: [place];  Service: Orthopedics;  Laterality: Left;    There were no vitals filed for this visit.  Subjective Assessment - 07/31/19 1100    Subjective  he saw neurosurgeon and had MRI of his neck, per patient report he has bone spur at C7 but no impingment or herniated discs so MD thinks his N/T in his fingers may be due to carpal tunnel so is ordering NCV test. Pt would also like to work on his knee stiffness today.    Pertinent History  PMH: lumbar DDD, previous neck fusion, sleep apnea, HTN, bilat TKA    Limitations  Standing;Walking    How long can you stand comfortably?  depends but sore and painful if he works all day    Diagnostic tests  MRI scan demonstrates degenerative disc disease and degenerative facet disease throughout the lower thoracic and lumbar regions    Patient Stated Goals  improve neck pain, improve knee ROM, be able to manage back pain if it returns                       [facility] Adult PT Treatment/Exercise - 07/31/19 0001      Exercises  Exercises  Other Exercises    Other Exercises   seated knee heelslides AAROM 10 sec X 5, standing lunge stretch 10 sec X 5, quad stretch supine with strap 30 seconds (stopped due to H.S. cramping), doorway stretch low for chest/brachial plexus/elbow/wrist extension 30 sec X 2, tendon glides for Carpal tunnel X 1 round      Modalities   Modalities  Moist Heat;Electrical Stimulation      Moist Heat Therapy   Number Minutes Moist Heat  15 Minutes    Moist Heat Location  Cervical      Electrical Stimulation   Electrical Stimulation Location  neck and Lt upper trap    Electrical Stimulation Action  IFC    Electrical Stimulation Parameters  tolerance    Electrical Stimulation Goals  Tone;Pain             PT Education - 07/31/19 1110    Education Details  HEP for carpal tunnel exerices, knee stretching exercises    Person(s) Educated   Patient    Methods  Explanation;Demonstration;Verbal cues;Handout    Comprehension  Verbalized understanding;Returned demonstration          PT Long Term Goals - 06/27/19 1429      PT LONG TERM GOAL #1   Title  Patient will increase gross Lt knee flexion movement to 0-115 degrees without pain in order to get up and down off a low chair. ( Target for all goals 6 weeks 08/08/19)    Time  6    Period  Weeks    Status  New      PT LONG TERM GOAL #2   Title  Pt will improve lumbar/cervical ROM to [facility].    Time  6    Period  Weeks    Status  New      PT LONG TERM GOAL #3   Title  Patient will be independent with final HEP for LE strength and stability    Time  6    Period  Weeks    Status  New      PT LONG TERM GOAL #4   Title  Pt will reduce overall pain levels for neck, back, knee to less than 3/10 on avg with ususal activity.    Time  6    Period  Weeks    Status  New      PT LONG TERM GOAL #5   Title  Patient will go up/down 8 steps without increased pain in order to perfrom ADLs and get in/out of houses safely for work ( 09/28/2018)     Baseline  some difficuly going down due to ROM    Time  6    Period  Weeks    Status  New            Plan - 07/31/19 1111    Clinical Impression Statement  He was given carpal tunnel exercises as MRI does not appear to show his neck is contributing his N/T in his last 2 fingers. Session also focused on knee ROM today as he is limited with this. PT will assess his response to new carpal tunnel exercises next session.    Personal Factors and Comorbidities  Comorbidity 2    Comorbidities  PMH: lumbar DDD, previous neck fusion, sleep apnea, HTN, bilat TKA    Examination-Activity Limitations  Bend;Squat;Stand;Lift    Examination-Participation Restrictions  Community Activity;Yard Work    Nurse, learning disability  Potential  Good    PT Frequency  2x / week    PT Duration  6 weeks    PT  Treatment/Interventions  ADLs/Self Care Home Management;Aquatic Therapy;Cryotherapy;Electrical Stimulation;Moist Heat;Ultrasound;Therapeutic activities;Therapeutic exercise;Neuromuscular re-education;Manual techniques;Passive range of motion;Dry needling;Taping    PT Next Visit Plan  previous neck fusion, add more knee stretching into HEP, needs Knee ROM, and general strength/conditioning    PT Home Exercise Plan  Access Code: [email or web address]    Consulted and Agree with Plan of Care  Patient       Patient will benefit from skilled therapeutic intervention in order to improve the following deficits and impairments:  Decreased activity tolerance, Decreased endurance, Decreased range of motion, Decreased strength, Hypomobility, Increased muscle spasms, Impaired flexibility, Pain  Visit Diagnosis: Acute bilateral low back pain without sciatica  Cervicalgia  Chronic pain of left knee  Stiffness of left knee, not elsewhere classified  Muscle weakness (generalized)  Chronic pain of right knee     Problem List Patient Active Problem List   Diagnosis Date Noted  . Class 2 obesity due to excess calories without serious comorbidity with body mass index (BMI) of 39.0 to 39.9 in adult 01/08/2019  . Pain in right elbow 12/06/2018  . Primary osteoarthritis of left knee 07/25/2018  . History of left knee replacement 07/25/2018  . Right foot pain 06/22/2018  . Acute left-sided low back pain with left-sided sciatica 06/22/2018  . Osteoarthritis 11/21/2017  . Hyperlipidemia 11/21/2017  . Low testosterone 09/26/2016  . Healthcare maintenance 09/26/2016  . S/P total knee replacement using cement, right 06/01/2016  . Sleep apnea 05/19/2016  . Hypertension 05/19/2016    Debbe Odea, PT,DPT 07/31/2019, 11:29 AM  [facility] Physical Therapy [address] [place], [place], [address] Phone: [phone]   Fax:  [phone]  Name: James Gallegos MRN: [id number] Date of Birth:  April 17, 1951

## 2019-08-07 ENCOUNTER — Encounter: Payer: 59 | Admitting: Physical Therapy

## 2019-08-10 ENCOUNTER — Ambulatory Visit (INDEPENDENT_AMBULATORY_CARE_PROVIDER_SITE_OTHER): Payer: 59 | Admitting: Physical Therapy

## 2019-08-10 ENCOUNTER — Other Ambulatory Visit: Payer: Self-pay

## 2019-08-10 DIAGNOSIS — M25562 Pain in left knee: Secondary | ICD-10-CM | POA: Diagnosis not present

## 2019-08-10 DIAGNOSIS — M25561 Pain in right knee: Secondary | ICD-10-CM | POA: Diagnosis not present

## 2019-08-10 DIAGNOSIS — M25662 Stiffness of left knee, not elsewhere classified: Secondary | ICD-10-CM | POA: Diagnosis not present

## 2019-08-10 DIAGNOSIS — M545 Low back pain, unspecified: Secondary | ICD-10-CM

## 2019-08-10 DIAGNOSIS — M6281 Muscle weakness (generalized): Secondary | ICD-10-CM

## 2019-08-10 DIAGNOSIS — G8929 Other chronic pain: Secondary | ICD-10-CM | POA: Diagnosis not present

## 2019-08-10 DIAGNOSIS — M542 Cervicalgia: Secondary | ICD-10-CM

## 2019-08-10 NOTE — Therapy (Signed)
[facility] Physical Therapy [address] [place], [place], [address] Phone: [phone]   Fax:  [phone]  Physical Therapy Treatment  Patient Details  Name: James Gallegos MRN: [id number] Date of Birth: [birth year] Referring Provider (PT): Durward Fortes   Encounter Date: 08/10/2019  PT End of Session - 08/10/19 1205    Visit Number  6    Number of Visits  12    Date for PT Re-Evaluation  08/08/19    Authorization Type  [facility]    PT Start Time  1015    PT Stop Time  1100    PT Time Calculation (min)  45 min    Activity Tolerance  Patient tolerated treatment well    Behavior During Therapy  [facility] for tasks assessed/performed       Past Medical History:  Diagnosis Date  . Arthritis    back & L knee, R foot - lis franc   . Deep vein thrombosis ([place])    after knee surgery in 2004  . Hypertension   . Murmur, cardiac    pt. reports its difficult to auscultate   . Sleep apnea    CPAP- last study- 2014    Past Surgical History:  Procedure Laterality Date  . CERVICAL SPINE SURGERY N/A 1990   C5-6 discectomy  . FINGER SURGERY Right 2008   x 3 index finger  . KNEE ARTHROPLASTY    . KNEE ARTHROSCOPY Bilateral    L 2004, R 2005  . MASS EXCISION Right 01/18/2017   Procedure: EXCISION BENIGN RIGHT NECK MASS;  Surgeon: Rozetta Nunnery, MD;  Location: [place];  Service: ENT;  Laterality: Right;  . MASS EXCISION Bilateral 03/23/2019   Procedure: EXCISIONS OF RIGHT CHEEK LESION, RIGHT POSTERIOR NECK LIPOMA  AND LEFT NECK NODULE;  Surgeon: Rozetta Nunnery, MD;  Location: [place];  Service: ENT;  Laterality: Bilateral;  . NASAL FRACTURE SURGERY    . 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 TOTAL KNEE ARTHROPLASTY Right 06/01/2016   Procedure: TOTAL KNEE ARTHROPLASTY;  Surgeon: [name], MD;  Location: [place];  Service: Orthopedics;  Laterality: Right;  . TOTAL KNEE ARTHROPLASTY Left 07/25/2018   Procedure:  LEFT TOTAL KNEE ARTHROPLASTY;  Surgeon: [name], MD;  Location: [place];  Service: Orthopedics;  Laterality: Left;    There were no vitals filed for this visit.  Subjective Assessment - 08/10/19 1158    Subjective  He will have NCV test next week, still having N/T in his last 2 fingers and now even having some in middle finger. This did not change any with carpal tunell exercises. He does relay more Lt knee pain today posterioly about 5/10    Pertinent History  PMH: lumbar DDD, previous neck fusion, sleep apnea, HTN, bilat TKA    Limitations  Standing;Walking    How long can you stand comfortably?  depends but sore and painful if he works all day    Diagnostic tests  MRI scan demonstrates degenerative disc disease and degenerative facet disease throughout the lower thoracic and lumbar regions    Patient Stated Goals  improve neck pain, improve knee ROM, be able to manage back pain if it returns                       [facility] Adult PT Treatment/Exercise - 08/10/19 0001      Exercises   Other Exercises   recumbant bike 5 min for  knee ROM, slantboard stretch 30 sec X 3 reps, seated bilat H.S. stretch 30 sec X 3 bilat, leg press 50 lb warm up for 15 reps then did 75 lbs for 2X15 reps bilat push      Manual Therapy   Manual therapy comments  knee PROM bilat for flexion and extension, mobs for flexion and P-A, grade 2-3, cervical PROM and gentle distraction                   PT Long Term Goals - 06/27/19 1429      PT LONG TERM GOAL #1   Title  Patient will increase gross Lt knee flexion movement to 0-115 degrees without pain in order to get up and down off a low chair. ( Target for all goals 6 weeks 08/08/19)    Time  6    Period  Weeks    Status  New      PT LONG TERM GOAL #2   Title  Pt will improve lumbar/cervical ROM to [facility]    Time  6    Period  Weeks    Status  New      PT LONG TERM GOAL #3   Title  Patient will be independent with final HEP for  LE strength and stability    Time  6    Period  Weeks    Status  New      PT LONG TERM GOAL #4   Title  Pt will reduce overall pain levels for neck, back, knee to less than 3/10 on avg with ususal activity.    Time  6    Period  Weeks    Status  New      PT LONG TERM GOAL #5   Title  Patient will go up/down 8 steps without increased pain in order to perfrom ADLs and get in/out of houses safely for work ( 09/28/2018)     Baseline  some difficuly going down due to ROM    Time  6    Period  Weeks    Status  New            Plan - 08/10/19 1205    Clinical Impression Statement  Carpal tunnel exercises did not help and he will get NCV next week, so session focused on knee pain, knee ROM, and knee strength but did also stretch his neck some at the end. He had good response to session with his knee and had less pain and more ROM upon leaving.    Personal Factors and Comorbidities  Comorbidity 2    Comorbidities  PMH: lumbar DDD, previous neck fusion, sleep apnea, HTN, bilat TKA    Examination-Activity Limitations  Bend;Squat;Stand;Lift    Examination-Participation Restrictions  Community Activity;Yard Work    [employer]chant [employer] officer  Evolving/Moderate complexity    Rehab Potential  Good    PT Frequency  2x / week    PT Duration  6 weeks    PT Treatment/Interventions  ADLs/Self Care Home Management;Aquatic Therapy;Cryotherapy;Electrical Stimulation;Moist Heat;Ultrasound;Therapeutic activities;Therapeutic exercise;Neuromuscular re-education;Manual techniques;Passive range of motion;Dry needling;Taping    PT Next Visit Plan  previous neck fusion, add more knee stretching into HEP, needs Knee ROM, and general strength/conditioning    PT Home Exercise Plan  Access Code: [email or web address]    Consulted and Agree with Plan of Care  Patient       Patient will benefit from skilled therapeutic intervention in order to improve the following deficits and impairments:  Decreased activity  tolerance, Decreased endurance, Decreased range of motion, Decreased strength, Hypomobility, Increased muscle spasms, Impaired flexibility, Pain  Visit Diagnosis: Acute bilateral low back pain without sciatica  Cervicalgia  Chronic pain of left knee  Stiffness of left knee, not elsewhere classified  Muscle weakness (generalized)  Chronic pain of right knee     Problem List Patient Active Problem List   Diagnosis Date Noted  . Class 2 obesity due to excess calories without serious comorbidity with body mass index (BMI) of 39.0 to 39.9 in adult 01/08/2019  . Pain in right elbow 12/06/2018  . Primary osteoarthritis of left knee 07/25/2018  . History of left knee replacement 07/25/2018  . Right foot pain 06/22/2018  . Acute left-sided low back pain with left-sided sciatica 06/22/2018  . Osteoarthritis 11/21/2017  . Hyperlipidemia 11/21/2017  . Low testosterone 09/26/2016  . Healthcare maintenance 09/26/2016  . S/P total knee replacement using cement, right 06/01/2016  . Sleep apnea 05/19/2016  . Hypertension 05/19/2016    Debbe Odea, PT,DPT 08/10/2019, 12:07 PM  [facility] Physical Therapy [address] [place], [place], [address] Phone: [phone]   Fax:  [phone]  Name: DAXON GREASON MRN: [id number] Date of Birth: [birth year]

## 2019-08-14 ENCOUNTER — Other Ambulatory Visit: Payer: Self-pay

## 2019-08-14 ENCOUNTER — Ambulatory Visit (INDEPENDENT_AMBULATORY_CARE_PROVIDER_SITE_OTHER): Payer: 59 | Admitting: Physical Therapy

## 2019-08-14 DIAGNOSIS — G8929 Other chronic pain: Secondary | ICD-10-CM | POA: Diagnosis not present

## 2019-08-14 DIAGNOSIS — M25562 Pain in left knee: Secondary | ICD-10-CM | POA: Diagnosis not present

## 2019-08-14 DIAGNOSIS — M6281 Muscle weakness (generalized): Secondary | ICD-10-CM

## 2019-08-14 DIAGNOSIS — M25561 Pain in right knee: Secondary | ICD-10-CM

## 2019-08-14 DIAGNOSIS — M542 Cervicalgia: Secondary | ICD-10-CM

## 2019-08-14 DIAGNOSIS — G5622 Lesion of ulnar nerve, left upper limb: Secondary | ICD-10-CM | POA: Diagnosis not present

## 2019-08-14 DIAGNOSIS — M25662 Stiffness of left knee, not elsewhere classified: Secondary | ICD-10-CM

## 2019-08-14 DIAGNOSIS — M4802 Spinal stenosis, cervical region: Secondary | ICD-10-CM | POA: Diagnosis not present

## 2019-08-14 DIAGNOSIS — M545 Low back pain, unspecified: Secondary | ICD-10-CM

## 2019-08-14 DIAGNOSIS — G5603 Carpal tunnel syndrome, bilateral upper limbs: Secondary | ICD-10-CM | POA: Diagnosis not present

## 2019-08-14 NOTE — Therapy (Signed)
[facility] Physical Therapy [address] [place], [place], [address] Phone: [phone]   Fax:  [phone]  Physical Therapy Treatment  Patient Details  Name: James Gallegos MRN: [id number] Date of Birth: 1950-09-03 Referring Provider (PT): Durward Fortes   Encounter Date: 08/14/2019  PT End of Session - 08/14/19 0901    Visit Number  7    Number of Visits  12    Date for PT Re-Evaluation  08/08/19    Authorization Type  [facility]    PT Start Time  0805    PT Stop Time  0845    PT Time Calculation (min)  40 min    Activity Tolerance  Patient tolerated treatment well    Behavior During Therapy  [facility] for tasks assessed/performed       Past Medical History:  Diagnosis Date  . Arthritis    back & L knee, R foot - lis franc   . Deep vein thrombosis ([place])    after knee surgery in 2004  . Hypertension   . Murmur, cardiac    pt. reports its difficult to auscultate   . Sleep apnea    CPAP- last study- 2014    Past Surgical History:  Procedure Laterality Date  . CERVICAL SPINE SURGERY N/A 1990   C5-6 discectomy  . FINGER SURGERY Right 2008   x 3 index finger  . KNEE ARTHROPLASTY    . KNEE ARTHROSCOPY Bilateral    L 2004, R 2005  . MASS EXCISION Right 01/18/2017   Procedure: EXCISION BENIGN RIGHT NECK MASS;  Surgeon: Rozetta Nunnery, MD;  Location: [place];  Service: ENT;  Laterality: Right;  . MASS EXCISION Bilateral 03/23/2019   Procedure: EXCISIONS OF RIGHT CHEEK LESION, RIGHT POSTERIOR NECK LIPOMA  AND LEFT NECK NODULE;  Surgeon: Rozetta Nunnery, MD;  Location: [place];  Service: ENT;  Laterality: Bilateral;  . NASAL FRACTURE SURGERY    . 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 TOTAL KNEE ARTHROPLASTY Right 06/01/2016   Procedure: TOTAL KNEE ARTHROPLASTY;  Surgeon: [name], MD;  Location: [place];  Service: Orthopedics;  Laterality: Right;  . TOTAL KNEE ARTHROPLASTY Left 07/25/2018   Procedure:  LEFT TOTAL KNEE ARTHROPLASTY;  Surgeon: [name], MD;  Location: [place];  Service: Orthopedics;  Laterality: Left;    There were no vitals filed for this visit.  Subjective Assessment - 08/14/19 0856    Subjective  Relays his knees feel great no more pain, he is a believer in PT now. Still having N/T in his Lt hand and will have NCV test today    Pertinent History  PMH: lumbar DDD, previous neck fusion, sleep apnea, HTN, bilat TKA    Limitations  Standing;Walking    How long can you stand comfortably?  depends but sore and painful if he works all day    Diagnostic tests  MRI scan demonstrates degenerative disc disease and degenerative facet disease throughout the lower thoracic and lumbar regions    Patient Stated Goals  improve neck pain, improve knee ROM, be able to manage back pain if it returns                       [facility] Adult PT Treatment/Exercise - 08/14/19 0001      Exercises   Other Exercises   Nu step L7 for 6 min for UE/LE ROM, slantboard stretch 30 sec X 3 reps, seated bilat H.S. stretch  30 sec X 3 bilat, leg press 50 lb warm up for 15 reps then did 75 lbs for 2X15 reps bilat push      Moist Heat Therapy   Number Minutes Moist Heat  10 Minutes    Moist Heat Location  Cervical   while stretching/MT to his knees     Manual Therapy   Manual therapy comments  knee PROM bilat for flexion and extension, mobs for flexion and P-A, grade 2-3, cervical PROM and gentle distraction, STM and T.P. release to cervical P.S and traps, manual pec stretching                  PT Long Term Goals - 06/27/19 1429      PT LONG TERM GOAL #1   Title  Patient will increase gross Lt knee flexion movement to 0-115 degrees without pain in order to get up and down off a low chair. ( Target for all goals 6 weeks 08/08/19)    Time  6    Period  Weeks    Status  New      PT LONG TERM GOAL #2   Title  Pt will improve lumbar/cervical ROM to [facility].    Time  6    Period   Weeks    Status  New      PT LONG TERM GOAL #3   Title  Patient will be independent with final HEP for LE strength and stability    Time  6    Period  Weeks    Status  New      PT LONG TERM GOAL #4   Title  Pt will reduce overall pain levels for neck, back, knee to less than 3/10 on avg with ususal activity.    Time  6    Period  Weeks    Status  New      PT LONG TERM GOAL #5   Title  Patient will go up/down 8 steps without increased pain in order to perfrom ADLs and get in/out of houses safely for work ( 09/28/2018)     Baseline  some difficuly going down due to ROM    Time  6    Period  Weeks    Status  New            Plan - 08/14/19 0901    Clinical Impression Statement  Focused on ROM for knee, Lt shoulder and neck as well as bilat knee strength. PT did note a lot of tighntess in his pec so performed pec stretching on Lt side. PT will await further results from [place] testing regarding the N/T in his Lt fingers    Personal Factors and Comorbidities  Comorbidity 2    Comorbidities  PMH: lumbar DDD, previous neck fusion, sleep apnea, HTN, bilat TKA    Examination-Activity Limitations  Bend;Squat;Stand;Lift    Examination-Participation Restrictions  Community Activity;Yard Work    [employer]chant [employer] officer  Evolving/Moderate complexity    Rehab Potential  Good    PT Frequency  2x / week    PT Duration  6 weeks    PT Treatment/Interventions  ADLs/Self Care Home Management;Aquatic Therapy;Cryotherapy;Electrical Stimulation;Moist Heat;Ultrasound;Therapeutic activities;Therapeutic exercise;Neuromuscular re-education;Manual techniques;Passive range of motion;Dry needling;Taping    PT Next Visit Plan  previous neck fusion, add more knee stretching into HEP, needs Knee ROM, and general strength/conditioning    PT Home Exercise Plan  Access Code: [email or web address]    Consulted and Agree with Plan of Care  Patient       Patient will benefit from skilled therapeutic intervention in  order to improve the following deficits and impairments:  Decreased activity tolerance, Decreased endurance, Decreased range of motion, Decreased strength, Hypomobility, Increased muscle spasms, Impaired flexibility, Pain  Visit Diagnosis: Acute bilateral low back pain without sciatica  Cervicalgia  Chronic pain of left knee  Stiffness of left knee, not elsewhere classified  Muscle weakness (generalized)  Chronic pain of right knee     Problem List Patient Active Problem List   Diagnosis Date Noted  . Class 2 obesity due to excess calories without serious comorbidity with body mass index (BMI) of 39.0 to 39.9 in adult 01/08/2019  . Pain in right elbow 12/06/2018  . Primary osteoarthritis of left knee 07/25/2018  . History of left knee replacement 07/25/2018  . Right foot pain 06/22/2018  . Acute left-sided low back pain with left-sided sciatica 06/22/2018  . Osteoarthritis 11/21/2017  . Hyperlipidemia 11/21/2017  . Low testosterone 09/26/2016  . Healthcare maintenance 09/26/2016  . S/P total knee replacement using cement, right 06/01/2016  . Sleep apnea 05/19/2016  . Hypertension 05/19/2016    Debbe Odea, PT,DPT 08/14/2019, 9:10 AM  [facility] Physical Therapy [address] [place], [place], [address] Phone: [phone]   Fax:  [phone]  Name: James Gallegos MRN: [id number] Date of Birth: 11/18/1950

## 2019-08-16 ENCOUNTER — Ambulatory Visit (INDEPENDENT_AMBULATORY_CARE_PROVIDER_SITE_OTHER): Payer: 59 | Admitting: Physical Therapy

## 2019-08-16 ENCOUNTER — Other Ambulatory Visit: Payer: Self-pay

## 2019-08-16 DIAGNOSIS — M25561 Pain in right knee: Secondary | ICD-10-CM | POA: Diagnosis not present

## 2019-08-16 DIAGNOSIS — M545 Low back pain, unspecified: Secondary | ICD-10-CM

## 2019-08-16 DIAGNOSIS — R600 Localized edema: Secondary | ICD-10-CM

## 2019-08-16 DIAGNOSIS — M6281 Muscle weakness (generalized): Secondary | ICD-10-CM

## 2019-08-16 DIAGNOSIS — M25662 Stiffness of left knee, not elsewhere classified: Secondary | ICD-10-CM

## 2019-08-16 DIAGNOSIS — G8929 Other chronic pain: Secondary | ICD-10-CM | POA: Diagnosis not present

## 2019-08-16 DIAGNOSIS — M25562 Pain in left knee: Secondary | ICD-10-CM

## 2019-08-16 DIAGNOSIS — M542 Cervicalgia: Secondary | ICD-10-CM | POA: Diagnosis not present

## 2019-08-16 NOTE — Therapy (Signed)
[facility] Physical Therapy [address] [place], [place], [address] Phone: [phone]   Fax:  [phone]  Physical Therapy Treatment  Patient Details  Name: James Gallegos MRN: [id number] Date of Birth: February 20, 1951 Referring Provider (PT): Durward Fortes   Encounter Date: 08/16/2019  PT End of Session - 08/16/19 0923    Visit Number  8    Number of Visits  12    Date for PT Re-Evaluation  08/08/19    Authorization Type  [facility]    PT Start Time  0805    PT Stop Time  0845    PT Time Calculation (min)  40 min    Activity Tolerance  Patient tolerated treatment well    Behavior During Therapy  [facility] for tasks assessed/performed       Past Medical History:  Diagnosis Date  . Arthritis    back & L knee, R foot - lis franc   . Deep vein thrombosis ([place])    after knee surgery in 2004  . Hypertension   . Murmur, cardiac    pt. reports its difficult to auscultate   . Sleep apnea    CPAP- last study- 2014    Past Surgical History:  Procedure Laterality Date  . CERVICAL SPINE SURGERY N/A 1990   C5-6 discectomy  . FINGER SURGERY Right 2008   x 3 index finger  . KNEE ARTHROPLASTY    . KNEE ARTHROSCOPY Bilateral    L 2004, R 2005  . MASS EXCISION Right 01/18/2017   Procedure: EXCISION BENIGN RIGHT NECK MASS;  Surgeon: Rozetta Nunnery, MD;  Location: [place];  Service: ENT;  Laterality: Right;  . MASS EXCISION Bilateral 03/23/2019   Procedure: EXCISIONS OF RIGHT CHEEK LESION, RIGHT POSTERIOR NECK LIPOMA  AND LEFT NECK NODULE;  Surgeon: Rozetta Nunnery, MD;  Location: [place];  Service: ENT;  Laterality: Bilateral;  . NASAL FRACTURE SURGERY    . 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 TOTAL KNEE ARTHROPLASTY Right 06/01/2016   Procedure: TOTAL KNEE ARTHROPLASTY;  Surgeon: [name], MD;  Location: [place];  Service: Orthopedics;  Laterality: Right;  . TOTAL KNEE ARTHROPLASTY Left 07/25/2018   Procedure:  LEFT TOTAL KNEE ARTHROPLASTY;  Surgeon: [name], MD;  Location: [place];  Service: Orthopedics;  Laterality: Left;    There were no vitals filed for this visit.  Subjective Assessment - 08/16/19 0811    Subjective  No pain in the knees just hurts if he tries to bend them too much. He had NCV test but wont get the results until tommorow    Pertinent History  PMH: lumbar DDD, previous neck fusion, sleep apnea, HTN, bilat TKA    Limitations  Standing;Walking    How long can you stand comfortably?  depends but sore and painful if he works all day    Diagnostic tests  MRI scan demonstrates degenerative disc disease and degenerative facet disease throughout the lower thoracic and lumbar regions    Patient Stated Goals  improve neck pain, improve knee ROM, be able to manage back pain if it returns                       [facility] Adult PT Treatment/Exercise - 08/16/19 0001      Exercises   Other Exercises   Nu step L7 for 6 min for UE/LE ROM, slantboard stretch 30 sec X 3 reps, sitting hamstring stretch 30 sec X  2 bilat, sit to stands for 2X10 reps no UE support. Doorway chest stretch for Lt 15 sec X 5, sidelying quad stretch 30 sec X 2 with strap, kneeling quad stretch 10 sec X 3      Manual Therapy   Manual therapy comments  knee PROM bilat for flexion and extension, mobs for flexion, patella mobs and P-A, grade 2-3 for Rt knee, Rt knee manual quad stretch 30 sec X 3 in sidelying                  PT Long Term Goals - 06/27/19 1429      PT LONG TERM GOAL #1   Title  Patient will increase gross Lt knee flexion movement to 0-115 degrees without pain in order to get up and down off a low chair. ( Target for all goals 6 weeks 08/08/19)    Time  6    Period  Weeks    Status  New      PT LONG TERM GOAL #2   Title  Pt will improve lumbar/cervical ROM to [facility].    Time  6    Period  Weeks    Status  New      PT LONG TERM GOAL #3   Title  Patient will be  independent with final HEP for LE strength and stability    Time  6    Period  Weeks    Status  New      PT LONG TERM GOAL #4   Title  Pt will reduce overall pain levels for neck, back, knee to less than 3/10 on avg with ususal activity.    Time  6    Period  Weeks    Status  New      PT LONG TERM GOAL #5   Title  Patient will go up/down 8 steps without increased pain in order to perfrom ADLs and get in/out of houses safely for work ( 09/28/2018)     Baseline  some difficuly going down due to ROM    Time  6    Period  Weeks    Status  New            Plan - 08/16/19 [id number]    Clinical Impression Statement  Focused on ROM for knee, Lt shoulder and neck as well as bilat knee strength. Continued pec stretching on Lt side. PT will await further results from [facility] testing regarding the N/T in his Lt fingers    Personal Factors and Comorbidities  Comorbidity 2    Comorbidities  PMH: lumbar DDD, previous neck fusion, sleep apnea, HTN, bilat TKA    Examination-Activity Limitations  Bend;Squat;Stand;Lift    Examination-Participation Restrictions  Community Activity;Yard Work    [employer] officer  Evolving/Moderate complexity    Rehab Potential  Good    PT Frequency  2x / week    PT Duration  6 weeks    PT Treatment/Interventions  ADLs/Self Care Home Management;Aquatic Therapy;Cryotherapy;Electrical Stimulation;Moist Heat;Ultrasound;Therapeutic activities;Therapeutic exercise;Neuromuscular re-education;Manual techniques;Passive range of motion;Dry needling;Taping    PT Next Visit Plan  previous neck fusion, add more knee stretching into HEP, needs Knee ROM, and general strength/conditioning    PT Home Exercise Plan  Access Code: [email or web address]    Consulted and Agree with Plan of Care  Patient       Patient will benefit from skilled therapeutic intervention in order to improve the following deficits and impairments:  Decreased activity tolerance, Decreased endurance, Decreased  range of motion, Decreased strength, Hypomobility, Increased muscle spasms, Impaired flexibility, Pain  Visit Diagnosis: Acute bilateral low back pain without sciatica  Cervicalgia  Chronic pain of left knee  Stiffness of left knee, not elsewhere classified  Muscle weakness (generalized)  Chronic pain of right knee  Acute pain of left knee  Localized edema     Problem List Patient Active Problem List   Diagnosis Date Noted  . Class 2 obesity due to excess calories without serious comorbidity with body mass index (BMI) of 39.0 to 39.9 in adult 01/08/2019  . Pain in right elbow 12/06/2018  . Primary osteoarthritis of left knee 07/25/2018  . History of left knee replacement 07/25/2018  . Right foot pain 06/22/2018  . Acute left-sided low back pain with left-sided sciatica 06/22/2018  . Osteoarthritis 11/21/2017  . Hyperlipidemia 11/21/2017  . Low testosterone 09/26/2016  . Healthcare maintenance 09/26/2016  . S/P total knee replacement using cement, right 06/01/2016  . Sleep apnea 05/19/2016  . Hypertension 05/19/2016    Debbe Odea, PT,DPT 08/16/2019, 9:44 AM  [facility] Physical Therapy [address] [place], [place], [address] Phone: [phone]   Fax:  [phone]  Name: ERMA FRIERSON MRN: [id number] Date of Birth: [birth year]

## 2019-08-17 DIAGNOSIS — M5030 Other cervical disc degeneration, unspecified cervical region: Secondary | ICD-10-CM | POA: Diagnosis not present

## 2019-08-17 DIAGNOSIS — M5412 Radiculopathy, cervical region: Secondary | ICD-10-CM | POA: Diagnosis not present

## 2019-08-17 DIAGNOSIS — Z6835 Body mass index (BMI) 35.0-35.9, adult: Secondary | ICD-10-CM | POA: Diagnosis not present

## 2019-08-17 DIAGNOSIS — G5622 Lesion of ulnar nerve, left upper limb: Secondary | ICD-10-CM | POA: Diagnosis not present

## 2019-08-17 DIAGNOSIS — G5603 Carpal tunnel syndrome, bilateral upper limbs: Secondary | ICD-10-CM | POA: Diagnosis not present

## 2019-08-17 DIAGNOSIS — I10 Essential (primary) hypertension: Secondary | ICD-10-CM | POA: Diagnosis not present

## 2019-08-17 DIAGNOSIS — M4722 Other spondylosis with radiculopathy, cervical region: Secondary | ICD-10-CM | POA: Diagnosis not present

## 2019-08-17 DIAGNOSIS — R29898 Other symptoms and signs involving the musculoskeletal system: Secondary | ICD-10-CM | POA: Diagnosis not present

## 2019-08-21 ENCOUNTER — Ambulatory Visit (INDEPENDENT_AMBULATORY_CARE_PROVIDER_SITE_OTHER): Payer: 59 | Admitting: Physical Therapy

## 2019-08-21 ENCOUNTER — Other Ambulatory Visit: Payer: Self-pay

## 2019-08-21 DIAGNOSIS — M25561 Pain in right knee: Secondary | ICD-10-CM | POA: Diagnosis not present

## 2019-08-21 DIAGNOSIS — M545 Low back pain, unspecified: Secondary | ICD-10-CM

## 2019-08-21 DIAGNOSIS — M542 Cervicalgia: Secondary | ICD-10-CM | POA: Diagnosis not present

## 2019-08-21 DIAGNOSIS — M25662 Stiffness of left knee, not elsewhere classified: Secondary | ICD-10-CM

## 2019-08-21 DIAGNOSIS — R600 Localized edema: Secondary | ICD-10-CM | POA: Diagnosis not present

## 2019-08-21 DIAGNOSIS — M6281 Muscle weakness (generalized): Secondary | ICD-10-CM | POA: Diagnosis not present

## 2019-08-21 DIAGNOSIS — G8929 Other chronic pain: Secondary | ICD-10-CM

## 2019-08-21 DIAGNOSIS — M25562 Pain in left knee: Secondary | ICD-10-CM

## 2019-08-21 NOTE — Therapy (Signed)
[facility] Physical Therapy [address] [place], [place], [address] Phone: [phone]   Fax:  [phone]  Physical Therapy Treatment  Patient Details  Name: James Gallegos MRN: [id number] Date of Birth: [birth year] Referring Provider (PT): Durward Fortes   Encounter Date: 08/21/2019  PT End of Session - 08/21/19 0912    Visit Number  9    Number of Visits  12    Date for PT Re-Evaluation  08/08/19    Authorization Type  [facility]    PT Start Time  0805    PT Stop Time  0850    PT Time Calculation (min)  45 min    Activity Tolerance  Patient tolerated treatment well    Behavior During Therapy  [facility] for tasks assessed/performed       Past Medical History:  Diagnosis Date  . Arthritis    back & L knee, R foot - lis franc   . Deep vein thrombosis ([place])    after knee surgery in 2004  . Hypertension   . Murmur, cardiac    pt. reports its difficult to auscultate   . Sleep apnea    CPAP- last study- 2014    Past Surgical History:  Procedure Laterality Date  . CERVICAL SPINE SURGERY N/A 1990   C5-6 discectomy  . FINGER SURGERY Right 2008   x 3 index finger  . KNEE ARTHROPLASTY    . KNEE ARTHROSCOPY Bilateral    L 2004, R 2005  . MASS EXCISION Right 01/18/2017   Procedure: EXCISION BENIGN RIGHT NECK MASS;  Surgeon: Rozetta Nunnery, MD;  Location: [place];  Service: ENT;  Laterality: Right;  . MASS EXCISION Bilateral 03/23/2019   Procedure: EXCISIONS OF RIGHT CHEEK LESION, RIGHT POSTERIOR NECK LIPOMA  AND LEFT NECK NODULE;  Surgeon: Rozetta Nunnery, MD;  Location: [place];  Service: ENT;  Laterality: Bilateral;  . NASAL FRACTURE SURGERY    . 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 TOTAL KNEE ARTHROPLASTY Right 06/01/2016   Procedure: TOTAL KNEE ARTHROPLASTY;  Surgeon: [name], MD;  Location: [place];  Service: Orthopedics;  Laterality: Right;  . TOTAL KNEE ARTHROPLASTY Left 07/25/2018   Procedure:  LEFT TOTAL KNEE ARTHROPLASTY;  Surgeon: [name], MD;  Location: [place];  Service: Orthopedics;  Laterality: Left;    There were no vitals filed for this visit.  Subjective Assessment - 08/21/19 0903    Subjective  He had NCV test and says it is inconclusive but MD suspects compression at the hand/wrist and will send him to hand specialist. He says MD also wants to try neck injection around C3-4    Pertinent History  PMH: lumbar DDD, previous neck fusion, sleep apnea, HTN, bilat TKA    Limitations  Standing;Walking    How long can you stand comfortably?  depends but sore and painful if he works all day    Diagnostic tests  MRI scan demonstrates degenerative disc disease and degenerative facet disease throughout the lower thoracic and lumbar regions    Patient Stated Goals  improve neck pain, improve knee ROM, be able to manage back pain if it returns                       [facility] Adult PT Treatment/Exercise - 08/21/19 0001      Exercises   Other Exercises   Nu step L7 for 6 min for UE/LE ROM, slantboard stretch 30 sec X  3 reps, standing hamstring stretch 30 sec X 2 bilat, standing knee flexion lunge stretch 10 sec X 10 reps bilat, Leg press one warm up set of 10 reps with 50 lbs then 100 lbs for 2X15       Manual Therapy   Manual therapy comments  manual quad stretching in SL 3X30 sec ea, seated neck/upper trap Lt side STM/T.P. relase.                   PT Long Term Goals - 06/27/19 1429      PT LONG TERM GOAL #1   Title  Patient will increase gross Lt knee flexion movement to 0-115 degrees without pain in order to get up and down off a low chair. ( Target for all goals 6 weeks 08/08/19)    Time  6    Period  Weeks    Status  New      PT LONG TERM GOAL #2   Title  Pt will improve lumbar/cervical ROM to [facility].    Time  6    Period  Weeks    Status  New      PT LONG TERM GOAL #3   Title  Patient will be independent with final HEP for LE strength and  stability    Time  6    Period  Weeks    Status  New      PT LONG TERM GOAL #4   Title  Pt will reduce overall pain levels for neck, back, knee to less than 3/10 on avg with ususal activity.    Time  6    Period  Weeks    Status  New      PT LONG TERM GOAL #5   Title  Patient will go up/down 8 steps without increased pain in order to perfrom ADLs and get in/out of houses safely for work ( 09/28/2018)     Baseline  some difficuly going down due to ROM    Time  6    Period  Weeks    Status  New            Plan - 08/21/19 0912    Clinical Impression Statement  Continued to focus on knee ROM and strength with good tolerance and not having pain in his knee but continues to have complaints of stiffness. Also focused on neck/Lt shoulder soft tissue restrictions and tigger points.    Personal Factors and Comorbidities  Comorbidity 2    Comorbidities  PMH: lumbar DDD, previous neck fusion, sleep apnea, HTN, bilat TKA    Examination-Activity Limitations  Bend;Squat;Stand;Lift    Examination-Participation Restrictions  Community Activity;Yard Work    [employer] officer  Evolving/Moderate complexity    Rehab Potential  Good    PT Frequency  2x / week    PT Duration  6 weeks    PT Treatment/Interventions  ADLs/Self Care Home Management;Aquatic Therapy;Cryotherapy;Electrical Stimulation;Moist Heat;Ultrasound;Therapeutic activities;Therapeutic exercise;Neuromuscular re-education;Manual techniques;Passive range of motion;Dry needling;Taping    PT Next Visit Plan  previous neck fusion, add more knee stretching into HEP, needs Knee ROM, and general strength/conditioning    PT Home Exercise Plan  Access Code: [email or web address]    Consulted and Agree with Plan of Care  Patient       Patient will benefit from skilled therapeutic intervention in order to improve the following deficits and impairments:  Decreased activity tolerance, Decreased endurance, Decreased range of motion, Decreased  strength, Hypomobility, Increased muscle spasms, Impaired  flexibility, Pain  Visit Diagnosis: Acute bilateral low back pain without sciatica  Cervicalgia  Chronic pain of left knee  Stiffness of left knee, not elsewhere classified  Muscle weakness (generalized)  Chronic pain of right knee  Localized edema     Problem List Patient Active Problem List   Diagnosis Date Noted  . Class 2 obesity due to excess calories without serious comorbidity with body mass index (BMI) of 39.0 to 39.9 in adult 01/08/2019  . Pain in right elbow 12/06/2018  . Primary osteoarthritis of left knee 07/25/2018  . History of left knee replacement 07/25/2018  . Right foot pain 06/22/2018  . Acute left-sided low back pain with left-sided sciatica 06/22/2018  . Osteoarthritis 11/21/2017  . Hyperlipidemia 11/21/2017  . Low testosterone 09/26/2016  . Healthcare maintenance 09/26/2016  . S/P total knee replacement using cement, right 06/01/2016  . Sleep apnea 05/19/2016  . Hypertension 05/19/2016    Debbe Odea, PT,DPT 08/21/2019, 9:42 AM  [facility] Physical Therapy [address] [place], [place], [address] Phone: [phone]   Fax:  [phone]  Name: James Gallegos MRN: [id number] Date of Birth: 11/16/1950

## 2019-08-23 ENCOUNTER — Other Ambulatory Visit: Payer: Self-pay

## 2019-08-23 ENCOUNTER — Ambulatory Visit (INDEPENDENT_AMBULATORY_CARE_PROVIDER_SITE_OTHER): Payer: 59 | Admitting: Physical Therapy

## 2019-08-23 ENCOUNTER — Ambulatory Visit: Payer: 59 | Admitting: Family Medicine

## 2019-08-23 DIAGNOSIS — R600 Localized edema: Secondary | ICD-10-CM | POA: Diagnosis not present

## 2019-08-23 DIAGNOSIS — G8929 Other chronic pain: Secondary | ICD-10-CM | POA: Diagnosis not present

## 2019-08-23 DIAGNOSIS — M545 Low back pain, unspecified: Secondary | ICD-10-CM

## 2019-08-23 DIAGNOSIS — M542 Cervicalgia: Secondary | ICD-10-CM | POA: Diagnosis not present

## 2019-08-23 DIAGNOSIS — M25562 Pain in left knee: Secondary | ICD-10-CM | POA: Diagnosis not present

## 2019-08-23 DIAGNOSIS — M25561 Pain in right knee: Secondary | ICD-10-CM | POA: Diagnosis not present

## 2019-08-23 NOTE — Therapy (Signed)
[facility] Physical Therapy [address] [place], [place], [address] Phone: [phone]   Fax:  [phone]  Physical Therapy Treatment  Patient Details  Name: James Gallegos MRN: [id number] Date of Birth: [birth year] Referring Provider (PT): Durward Fortes   Encounter Date: 08/23/2019  PT End of Session - 08/23/19 0934    Visit Number  10    Number of Visits  12    Date for PT Re-Evaluation  08/08/19    Authorization Type  [facility]    PT Start Time  0805    PT Stop Time  0850    PT Time Calculation (min)  45 min    Activity Tolerance  Patient tolerated treatment well    Behavior During Therapy  [facility] for tasks assessed/performed       Past Medical History:  Diagnosis Date  . Arthritis    back & L knee, R foot - lis franc   . Deep vein thrombosis ([place])    after knee surgery in 2004  . Hypertension   . Murmur, cardiac    pt. reports its difficult to auscultate   . Sleep apnea    CPAP- last study- 2014    Past Surgical History:  Procedure Laterality Date  . CERVICAL SPINE SURGERY N/A 1990   C5-6 discectomy  . FINGER SURGERY Right 2008   x 3 index finger  . KNEE ARTHROPLASTY    . KNEE ARTHROSCOPY Bilateral    L 2004, R 2005  . MASS EXCISION Right 01/18/2017   Procedure: EXCISION BENIGN RIGHT NECK MASS;  Surgeon: Rozetta Nunnery, MD;  Location: [place];  Service: ENT;  Laterality: Right;  . MASS EXCISION Bilateral 03/23/2019   Procedure: EXCISIONS OF RIGHT CHEEK LESION, RIGHT POSTERIOR NECK LIPOMA  AND LEFT NECK NODULE;  Surgeon: Rozetta Nunnery, MD;  Location: [place];  Service: ENT;  Laterality: Bilateral;  . NASAL FRACTURE SURGERY    . 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 TOTAL KNEE ARTHROPLASTY Right 06/01/2016   Procedure: TOTAL KNEE ARTHROPLASTY;  Surgeon: [name], MD;  Location: [place];  Service: Orthopedics;  Laterality: Right;  . TOTAL KNEE ARTHROPLASTY Left 07/25/2018   Procedure: LEFT TOTAL KNEE ARTHROPLASTY;  Surgeon: [name], MD;  Location: [place];  Service: Orthopedics;  Laterality: Left;    There were no vitals filed for this visit.  Subjective Assessment - 08/23/19 0926    Subjective  relays he will contact MD about seing orhto hand surgeon as the other recommendation was out of network. Continues to have complaints with knee stiffness Rt>Lt. he does relay his Lt knee is doing good however.    Pertinent History  PMH: lumbar DDD, previous neck fusion, sleep apnea, HTN, bilat TKA    Limitations  Standing;Walking    How long can you stand comfortably?  depends but sore and painful if he works all day    Diagnostic tests  MRI scan demonstrates degenerative disc disease and degenerative facet disease throughout the lower thoracic and lumbar regions    Patient Stated Goals  improve neck pain, improve knee ROM, be able to manage back pain if it returns          [facility] Adult PT Treatment/Exercise - 08/23/19 0001      Exercises   Other Exercises   recumbant bike 6 min for LE ROM, slantboard stretch 30 sec X 3 reps,  standing knee flexion lunge stretch 10 sec X 10 reps bilat,  Leg press one warm up set of 10 reps with 50 lbs then 125 lbs for 3X10. Seated knee flexion heel slides with self OP from other leg 5 sec X 10       Manual Therapy   Manual therapy comments  manual quad stretching in SL 3X30 sec ea, knee PROM for flexion bilat, seated neck/upper trap Lt side STM/T.P. relase.          PT Long Term Goals - 08/23/19 [id number]      PT LONG TERM GOAL #1   Title  Patient will increase gross Lt knee flexion movement to 0-115 degrees without pain in order to get up and down off a low chair. ( Target for all goals 6 weeks 08/08/19)    Time  6    Period  Weeks    Status  On-going      PT LONG TERM GOAL #2   Title  Pt will improve lumbar/cervical ROM to [facility].    Time  6    Period  Weeks    Status  On-going      PT LONG TERM GOAL #3   Title   Patient will be independent with final HEP for LE strength and stability    Time  6    Period  Weeks    Status  On-going      PT LONG TERM GOAL #4   Title  Pt will reduce overall pain levels for neck, back, knee to less than 3/10 on avg with ususal activity.    Time  6    Period  Weeks    Status  On-going      PT LONG TERM GOAL #5   Title  Patient will go up/down 8 steps without increased pain in order to perfrom ADLs and get in/out of houses safely for work ( 09/28/2018)     Baseline  some difficuly going down due to ROM    Time  6    Period  Weeks    Status  On-going            Plan - 08/23/19 0934    Clinical Impression Statement  He is improving with knee ROM but Left is progressing better than Rt and also Rt knee is staying more sore. Continued to address soft tissue restrictions and tigger points in his left neck/upper traps    Personal Factors and Comorbidities  Comorbidity 2    Comorbidities  PMH: lumbar DDD, previous neck fusion, sleep apnea, HTN, bilat TKA    Examination-Activity Limitations  Bend;Squat;Stand;Lift    Examination-Participation Restrictions  Community Activity;Yard Work    [employer] officer  Evolving/Moderate complexity    Rehab Potential  Good    PT Frequency  2x / week    PT Duration  6 weeks    PT Treatment/Interventions  ADLs/Self Care Home Management;Aquatic Therapy;Cryotherapy;Electrical Stimulation;Moist Heat;Ultrasound;Therapeutic activities;Therapeutic exercise;Neuromuscular re-education;Manual techniques;Passive range of motion;Dry needling;Taping    PT Next Visit Plan  previous neck fusion, add more knee stretching into HEP, needs Knee ROM, and general strength/conditioning    PT Home Exercise Plan  Access Code: [email or web address]    Consulted and Agree with Plan of Care  Patient       Patient will benefit from skilled therapeutic intervention in order to improve the following deficits and impairments:  Decreased activity tolerance,  Decreased endurance, Decreased range of motion, Decreased strength, Hypomobility, Increased muscle spasms, Impaired flexibility, Pain  Visit Diagnosis: Cervicalgia  Chronic pain of right  knee  Localized edema  Chronic pain of left knee  Acute bilateral low back pain without sciatica     Problem List Patient Active Problem List   Diagnosis Date Noted  . Class 2 obesity due to excess calories without serious comorbidity with body mass index (BMI) of 39.0 to 39.9 in adult 01/08/2019  . Pain in right elbow 12/06/2018  . Primary osteoarthritis of left knee 07/25/2018  . History of left knee replacement 07/25/2018  . Right foot pain 06/22/2018  . Acute left-sided low back pain with left-sided sciatica 06/22/2018  . Osteoarthritis 11/21/2017  . Hyperlipidemia 11/21/2017  . Low testosterone 09/26/2016  . Healthcare maintenance 09/26/2016  . S/P total knee replacement using cement, right 06/01/2016  . Sleep apnea 05/19/2016  . Hypertension 05/19/2016    Debbe Odea, PT,DPT 08/23/2019, 9:40 AM  [facility] Physical Therapy [address] [place], [place], [address] Phone: [phone]   Fax:  [phone]  Name: DEVOHN KIDANE MRN: [id number] Date of Birth: 03-09-1951

## 2019-08-28 ENCOUNTER — Ambulatory Visit (INDEPENDENT_AMBULATORY_CARE_PROVIDER_SITE_OTHER): Payer: 59 | Admitting: Physical Therapy

## 2019-08-28 ENCOUNTER — Ambulatory Visit (INDEPENDENT_AMBULATORY_CARE_PROVIDER_SITE_OTHER): Payer: 59 | Admitting: Orthopaedic Surgery

## 2019-08-28 ENCOUNTER — Other Ambulatory Visit: Payer: Self-pay

## 2019-08-28 ENCOUNTER — Encounter: Payer: Self-pay | Admitting: Orthopaedic Surgery

## 2019-08-28 VITALS — Ht 69.0 in | Wt 222.0 lb

## 2019-08-28 DIAGNOSIS — M545 Low back pain, unspecified: Secondary | ICD-10-CM

## 2019-08-28 DIAGNOSIS — R600 Localized edema: Secondary | ICD-10-CM | POA: Diagnosis not present

## 2019-08-28 DIAGNOSIS — M25562 Pain in left knee: Secondary | ICD-10-CM

## 2019-08-28 DIAGNOSIS — G5622 Lesion of ulnar nerve, left upper limb: Secondary | ICD-10-CM | POA: Diagnosis not present

## 2019-08-28 DIAGNOSIS — M5442 Lumbago with sciatica, left side: Secondary | ICD-10-CM | POA: Diagnosis not present

## 2019-08-28 DIAGNOSIS — G8929 Other chronic pain: Secondary | ICD-10-CM

## 2019-08-28 DIAGNOSIS — M542 Cervicalgia: Secondary | ICD-10-CM | POA: Diagnosis not present

## 2019-08-28 DIAGNOSIS — M25561 Pain in right knee: Secondary | ICD-10-CM | POA: Diagnosis not present

## 2019-08-28 NOTE — Therapy (Signed)
[facility] Physical Therapy [address] [place], [place], [address] Phone: [phone]   Fax:  [phone]  Physical Therapy Treatment  Patient Details  Name: James Gallegos MRN: [id number] Date of Birth: June 30, 1950 Referring Provider (PT): Durward Fortes   Encounter Date: 08/28/2019  PT End of Session - 08/28/19 0945    Visit Number  11    Number of Visits  12    Date for PT Re-Evaluation  08/08/19    Authorization Type  [facility]    PT Start Time  0845    PT Stop Time  0935    PT Time Calculation (min)  50 min    Activity Tolerance  Patient tolerated treatment well    Behavior During Therapy  [facility] for tasks assessed/performed       Past Medical History:  Diagnosis Date  . Arthritis    back & L knee, R foot - lis franc   . Deep vein thrombosis ([place])    after knee surgery in 2004  . Hypertension   . Murmur, cardiac    pt. reports its difficult to auscultate   . Sleep apnea    CPAP- last study- 2014    Past Surgical History:  Procedure Laterality Date  . CERVICAL SPINE SURGERY N/A 1990   C5-6 discectomy  . FINGER SURGERY Right 2008   x 3 index finger  . KNEE ARTHROPLASTY    . KNEE ARTHROSCOPY Bilateral    L 2004, R 2005  . MASS EXCISION Right 01/18/2017   Procedure: EXCISION BENIGN RIGHT NECK MASS;  Surgeon: Rozetta Nunnery, MD;  Location: [place];  Service: ENT;  Laterality: Right;  . MASS EXCISION Bilateral 03/23/2019   Procedure: EXCISIONS OF RIGHT CHEEK LESION, RIGHT POSTERIOR NECK LIPOMA  AND LEFT NECK NODULE;  Surgeon: Rozetta Nunnery, MD;  Location: [place];  Service: ENT;  Laterality: Bilateral;  . NASAL FRACTURE SURGERY    . 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 TOTAL KNEE ARTHROPLASTY Right 06/01/2016   Procedure: TOTAL KNEE ARTHROPLASTY;  Surgeon: [name], MD;  Location: [place];  Service: Orthopedics;  Laterality: Right;  . TOTAL KNEE ARTHROPLASTY Left 07/25/2018   Procedure: LEFT TOTAL KNEE ARTHROPLASTY;  Surgeon: [name], MD;  Location: [place];  Service: Orthopedics;  Laterality: Left;    There were no vitals filed for this visit.  Subjective Assessment - 08/28/19 0940    Subjective  relays no pain in his knees just stiffness in Rt knee. He says he will see MD today as nothing has changed with is neck/Lt hand numbness    Pertinent History  PMH: lumbar DDD, previous neck fusion, sleep apnea, HTN, bilat TKA    Limitations  Standing;Walking    How long can you stand comfortably?  depends but sore and painful if he works all day    Diagnostic tests  MRI scan demonstrates degenerative disc disease and degenerative facet disease throughout the lower thoracic and lumbar regions    Patient Stated Goals  improve neck pain, improve knee ROM, be able to manage back pain if it returns         [facility] PT Assessment - 08/28/19 0001      Assessment   Medical Diagnosis  acute low back pain, chronic neck pain,  knee pain/stffness S/P TKA    Referring Provider (PT)  [place]      PROM   PROM Assessment Site  Knee    Right/Left Knee  Right    Right Knee Flexion  98                   [facility] Adult PT Treatment/Exercise - 08/28/19 0001      Exercises   Other Exercises   recumbant bike 6 min for LE ROM, slantboard stretch 30 sec X 2 reps,  standing knee flexion lunge stretch 10 sec X 10 reps bilat, Leg press one warm up set of 10 reps with 50 lbs then 125 lbs for 3X12. Seated knee flexion heel slides with self OP from other leg 5 sec X 10       Manual Therapy   Manual therapy comments  manual quad stretching in SL 3X30 sec ea, knee PROM for flexion bilat, seated neck/upper trap Lt side STM/T.P. relase.                   PT Long Term Goals - 08/23/19 [id number]      PT LONG TERM GOAL #1   Title  Patient will increase gross Lt knee flexion movement to 0-115 degrees without pain in order to get up and down off a low chair. ( Target for  all goals 6 weeks 08/08/19)    Time  6    Period  Weeks    Status  On-going      PT LONG TERM GOAL #2   Title  Pt will improve lumbar/cervical ROM to [facility].    Time  6    Period  Weeks    Status  On-going      PT LONG TERM GOAL #3   Title  Patient will be independent with final HEP for LE strength and stability    Time  6    Period  Weeks    Status  On-going      PT LONG TERM GOAL #4   Title  Pt will reduce overall pain levels for neck, back, knee to less than 3/10 on avg with ususal activity.    Time  6    Period  Weeks    Status  On-going      PT LONG TERM GOAL #5   Title  Patient will go up/down 8 steps without increased pain in order to perfrom ADLs and get in/out of houses safely for work ( 09/28/2018)     Baseline  some difficuly going down due to ROM    Time  6    Period  Weeks    Status  On-going            Plan - 08/28/19 0945    Clinical Impression Statement  Continued to focus on knee ROM with more focus on Rt as this is the one that is most stiff. He did have up to 98 deg Rt knee fleixon PROM today which is some improvment from 90 deg when he first started with PT. His left knee ROM is doing well.    Personal Factors and Comorbidities  Comorbidity 2    Comorbidities  PMH: lumbar DDD, previous neck fusion, sleep apnea, HTN, bilat TKA    Examination-Activity Limitations  Bend;Squat;Stand;Lift    Examination-Participation Restrictions  Community Activity;Yard Work    [employer] officer  Evolving/Moderate complexity    Rehab Potential  Good    PT Frequency  2x / week    PT Duration  6 weeks    PT Treatment/Interventions  ADLs/Self Care Home Management;Aquatic Therapy;Cryotherapy;Electrical Stimulation;Moist Heat;Ultrasound;Therapeutic activities;Therapeutic exercise;Neuromuscular re-education;Manual techniques;Passive range of motion;Dry  needling;Taping    PT Next Visit Plan  previous neck fusion, add more knee stretching into HEP, needs Knee ROM,  and general strength/conditioning    PT Home Exercise Plan  Access Code: [email or web address]    Consulted and Agree with Plan of Care  Patient       Patient will benefit from skilled therapeutic intervention in order to improve the following deficits and impairments:  Decreased activity tolerance, Decreased endurance, Decreased range of motion, Decreased strength, Hypomobility, Increased muscle spasms, Impaired flexibility, Pain  Visit Diagnosis: Cervicalgia  Chronic pain of right knee  Chronic pain of left knee  Localized edema  Acute bilateral low back pain without sciatica     Problem List Patient Active Problem List   Diagnosis Date Noted  . Class 2 obesity due to excess calories without serious comorbidity with body mass index (BMI) of 39.0 to 39.9 in adult 01/08/2019  . Pain in right elbow 12/06/2018  . Primary osteoarthritis of left knee 07/25/2018  . History of left knee replacement 07/25/2018  . Right foot pain 06/22/2018  . Acute left-sided low back pain with left-sided sciatica 06/22/2018  . Osteoarthritis 11/21/2017  . Hyperlipidemia 11/21/2017  . Low testosterone 09/26/2016  . Healthcare maintenance 09/26/2016  . S/P total knee replacement using cement, right 06/01/2016  . Sleep apnea 05/19/2016  . Hypertension 05/19/2016    Debbe Odea, PT,DPT 08/28/2019, 9:51 AM  [facility] Physical Therapy [address] [place], [place], [address] Phone: [phone]   Fax:  [phone]  Name: James Gallegos MRN: [id number] Date of Birth: 1950/09/24

## 2019-08-28 NOTE — Progress Notes (Signed)
Office Visit Note   Patient: James Gallegos           Date of Birth: Aug 01, 1950           MRN: [id number] Visit Date: 08/28/2019              Requested by: Eunice Blase, MD [address] [place],  [place] [address] PCP: Eunice Blase, MD   Assessment & Plan: Visit Diagnoses:  1. Acute left-sided low back pain with left-sided sciatica   2. Irritation of ulnar nerve, left     Plan: Octavien came to the office to discuss the issues he is having with the cervical lumbar spine as well as the left elbow problem.  He has had evidence of degenerative changes lumbar spine but notes that it slowly resolved and no longer is having any symptoms referable to his back or referred pain to either lower extremity.  He said bilateral total knee replacements and is still rehabbing his right knee which did not achieve the same range of motion as the left but "getting better".  He also was seen Dr. Sherwood Gambler in regards to his cervical spine.  He had an MRI scan that demonstrated some degenerative changes in the mid third.  He has been experiencing referred pain with numbness and tingling into the ulnar 2 digits of his left hand.  He had EMGs and nerve conduction studies performed to Dr. Donnella Bi office that were consistent with ulnar nerve irritation.  According to Lanney it was "inconclusive" as to whether or not it was at the elbow or the wrist.  He had been referred to one of the hand surgeons and wanted to come and get my opinion.  I think that the problem is referable to the ulnar nerve at his left elbow.  He has numbness tingling and pain to percussion.  He has no symptoms relative to the wrist.  We had discussed ulnar nerve decompression but I would like to obtain the studies before we proceed.  He is scheduled to have a neck injection through Dr. Donnella Bi office next week and then will return shortly thereafter.  Follow-Up Instructions: Return if symptoms worsen or fail to improve.   Orders:  No  orders of the defined types were placed in this encounter.  No orders of the defined types were placed in this encounter.     Procedures: No procedures performed   Clinical Data: No additional findings.   Subjective: Chief Complaint  Patient presents with  . Lower Back - Pain  Patient presents today for follow up on his lower back. He said that he is currently not having any low back problems. He has two more visits in physical therapy. Patient also states that he has right foot pain that he states he saw Dr.Channell Quattrone for awhile back. His pain in his foot is across his forefoot and radiates into his hallux. He also states that he has developed a bunion. He also has questions regarding a referral for his hand that a neurosurgeon has referred.  [name] relates that he no longer has any symptoms referable to his back or lower extremities other than some persistent stiffness of his right total knee replacement for which he is receiving therapy.  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  The other issue is with his left upper extremity.  He had EMGs and nerve conduction studies performed through Dr. Donnella Bi office that were consistent consistent with some ulnar nerve irritation  but inconclusive as to whether or not it was elbow or wrist.  Justice just wanted discussed the above and treatment options  HPI  Review of Systems  Constitutional: Negative for fatigue.  HENT: Negative for ear pain.   Eyes: Negative for pain.  Respiratory: Negative for shortness of breath.   Cardiovascular: Negative for leg swelling.  Gastrointestinal: Negative for constipation and diarrhea.  Endocrine: Negative for cold intolerance and heat intolerance.  Genitourinary: Negative for difficulty urinating.  Musculoskeletal: Positive for joint swelling.  Skin: Negative for rash.  Allergic/Immunologic: Negative for food allergies.  Neurological: Negative for weakness.    Hematological: Does not bruise/bleed easily.  Psychiatric/Behavioral: Negative for sleep disturbance.     Objective: Vital Signs: Ht 5\' 9"  (1.753 m)   Wt 222 lb (100.7 kg)   BMI 32.78 kg/m   Physical Exam Constitutional:      Appearance: He is well-developed.  Eyes:     Pupils: Pupils are equal, round, and reactive to light.  Pulmonary:     Effort: Pulmonary effort is normal.  Skin:    General: Skin is warm and dry.  Neurological:     Mental Status: He is alert and oriented to person, place, and time.  Psychiatric:        Behavior: Behavior normal.     Ortho Exam awake alert and oriented x3.  Comfortable sitting.  No pain with percussion of the lumbar spine.  Straight leg raise negative.  Painless range of motion both hips.  Painless range of motion of his left total knee replacement.  Had about 100 degrees of flexion with his right total knee replacement with just about full extension and no instability.  No pain referred to the left upper extremity with motion of the cervical spine.  He did have positive Tinel's and over the ulnar nerve at the elbow with referred discomfort to the ulnar 2 digits.  No pain about the ulnar nerve at the right elbow.  No Tinel's or Phalen's at that level either.  He was able to abduct and abduct his fingers and had good sensibility  Specialty Comments:  No specialty comments available.  Imaging: No results found.   PMFS History: Patient Active Problem List   Diagnosis Date Noted  . Irritation of ulnar nerve, left 08/28/2019  . Class 2 obesity due to excess calories without serious comorbidity with body mass index (BMI) of 39.0 to 39.9 in adult 01/08/2019  . Pain in right elbow 12/06/2018  . Primary osteoarthritis of left knee 07/25/2018  . History of left knee replacement 07/25/2018  . Right foot pain 06/22/2018  . Acute left-sided low back pain with left-sided sciatica 06/22/2018  . Osteoarthritis 11/21/2017  . Hyperlipidemia  11/21/2017  . Low testosterone 09/26/2016  . Healthcare maintenance 09/26/2016  . S/P total knee replacement using cement, right 06/01/2016  . Sleep apnea 05/19/2016  . Hypertension 05/19/2016   Past Medical History:  Diagnosis Date  . Arthritis    back & L knee, R foot - lis franc   . Deep vein thrombosis ([place])    after knee surgery in 2004  . Hypertension   . Murmur, cardiac    pt. reports its difficult to auscultate   . Sleep apnea    CPAP- last study- 2014    Family History  Problem Relation Age of Onset  . Alzheimer's disease Father   . Colon cancer Neg Hx   . Esophageal cancer Neg Hx   . Rectal cancer Neg Hx   .  Stomach cancer Neg Hx     Past Surgical History:  Procedure Laterality Date  . CERVICAL SPINE SURGERY N/A 1990   C5-6 discectomy  . FINGER SURGERY Right 2008   x 3 index finger  . KNEE ARTHROPLASTY    . KNEE ARTHROSCOPY Bilateral    L 2004, R 2005  . MASS EXCISION Right 01/18/2017   Procedure: EXCISION BENIGN RIGHT NECK MASS;  Surgeon: Rozetta Nunnery, MD;  Location: [place];  Service: ENT;  Laterality: Right;  . MASS EXCISION Bilateral 03/23/2019   Procedure: EXCISIONS OF RIGHT CHEEK LESION, RIGHT POSTERIOR NECK LIPOMA  AND LEFT NECK NODULE;  Surgeon: Rozetta Nunnery, MD;  Location: [place];  Service: ENT;  Laterality: Bilateral;  . NASAL FRACTURE SURGERY    . 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 TOTAL KNEE ARTHROPLASTY Right 06/01/2016   Procedure: TOTAL KNEE ARTHROPLASTY;  Surgeon: [name], MD;  Location: [place];  Service: Orthopedics;  Laterality: Right;  . TOTAL KNEE ARTHROPLASTY Left 07/25/2018   Procedure: LEFT TOTAL KNEE ARTHROPLASTY;  Surgeon: [name], MD;  Location: [place];  Service: Orthopedics;  Laterality: Left;   Social History   Occupational History  . Not on file  Tobacco Use  . Smoking status: Never Smoker  . Smokeless tobacco: Never Used  Substance and Sexual  Activity  . Alcohol use: Yes    Comment: rarely  . Drug use: No  . Sexual activity: Not on file

## 2019-08-30 ENCOUNTER — Encounter: Payer: 59 | Admitting: Physical Therapy

## 2019-08-31 MED FILL — AMOXICILLIN 500 MG CAPSULE: 500 | 3 days supply | Qty: 12 | Fill #1

## 2019-09-04 ENCOUNTER — Encounter: Payer: 59 | Admitting: Physical Therapy

## 2019-09-05 DIAGNOSIS — R030 Elevated blood-pressure reading, without diagnosis of hypertension: Secondary | ICD-10-CM | POA: Diagnosis not present

## 2019-09-05 DIAGNOSIS — M47812 Spondylosis without myelopathy or radiculopathy, cervical region: Secondary | ICD-10-CM | POA: Diagnosis not present

## 2019-09-05 DIAGNOSIS — Z6833 Body mass index (BMI) 33.0-33.9, adult: Secondary | ICD-10-CM | POA: Diagnosis not present

## 2019-09-06 ENCOUNTER — Encounter: Payer: 59 | Admitting: Physical Therapy

## 2019-09-18 ENCOUNTER — Ambulatory Visit (INDEPENDENT_AMBULATORY_CARE_PROVIDER_SITE_OTHER): Payer: 59 | Admitting: Physical Therapy

## 2019-09-18 ENCOUNTER — Other Ambulatory Visit: Payer: Self-pay

## 2019-09-18 ENCOUNTER — Encounter: Payer: Self-pay | Admitting: Physical Therapy

## 2019-09-18 DIAGNOSIS — M25561 Pain in right knee: Secondary | ICD-10-CM

## 2019-09-18 DIAGNOSIS — M545 Low back pain, unspecified: Secondary | ICD-10-CM

## 2019-09-18 DIAGNOSIS — G8929 Other chronic pain: Secondary | ICD-10-CM

## 2019-09-18 DIAGNOSIS — M6281 Muscle weakness (generalized): Secondary | ICD-10-CM

## 2019-09-18 DIAGNOSIS — M542 Cervicalgia: Secondary | ICD-10-CM

## 2019-09-18 DIAGNOSIS — R600 Localized edema: Secondary | ICD-10-CM

## 2019-09-18 DIAGNOSIS — M25562 Pain in left knee: Secondary | ICD-10-CM

## 2019-09-18 NOTE — Therapy (Signed)
[facility] Physical Therapy [address] [place], [place], [address] Phone: [phone]   Fax:  [phone]  Physical Therapy Treatment/Recert  Patient Details  Name: James Gallegos MRN: [id number] Date of Birth: 01/04/1951 Referring Provider (PT): Durward Fortes   Encounter Date: 09/18/2019  PT End of Session - 09/18/19 0830    Visit Number  12    Number of Visits  22   recert on [id number]   Date for PT Re-Evaluation  10/30/19    Authorization Type  [facility]    PT Start Time  0804    PT Stop Time  0850    PT Time Calculation (min)  46 min    Activity Tolerance  Patient tolerated treatment well    Behavior During Therapy  [facility] for tasks assessed/performed       Past Medical History:  Diagnosis Date  . Arthritis    back & L knee, R foot - lis franc   . Deep vein thrombosis ([place])    after knee surgery in 2004  . Hypertension   . Murmur, cardiac    pt. reports its difficult to auscultate   . Sleep apnea    CPAP- last study- 2014    Past Surgical History:  Procedure Laterality Date  . CERVICAL SPINE SURGERY N/A 1990   C5-6 discectomy  . FINGER SURGERY Right 2008   x 3 index finger  . KNEE ARTHROPLASTY    . KNEE ARTHROSCOPY Bilateral    L 2004, R 2005  . MASS EXCISION Right 01/18/2017   Procedure: EXCISION BENIGN RIGHT NECK MASS;  Surgeon: Rozetta Nunnery, MD;  Location: [place];  Service: ENT;  Laterality: Right;  . MASS EXCISION Bilateral 03/23/2019   Procedure: EXCISIONS OF RIGHT CHEEK LESION, RIGHT POSTERIOR NECK LIPOMA  AND LEFT NECK NODULE;  Surgeon: Rozetta Nunnery, MD;  Location: [place];  Service: ENT;  Laterality: Bilateral;  . NASAL FRACTURE SURGERY    . 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 TOTAL KNEE ARTHROPLASTY Right 06/01/2016   Procedure: TOTAL KNEE ARTHROPLASTY;  Surgeon: [name], MD;  Location: [place];  Service: Orthopedics;  Laterality: Right;  . TOTAL KNEE ARTHROPLASTY  Left 07/25/2018   Procedure: LEFT TOTAL KNEE ARTHROPLASTY;  Surgeon: [name], MD;  Location: [place];  Service: Orthopedics;  Laterality: Left;    There were no vitals filed for this visit.  Subjective Assessment - 09/18/19 0820    Subjective  relays he had an injection in his neck which did not help that much, still having N/T in his Lt hand. His Rt knee is also very stiff today    Pertinent History  PMH: lumbar DDD, previous neck fusion, sleep apnea, HTN, bilat TKA    Limitations  Standing;Walking    How long can you stand comfortably?  depends but sore and painful if he works all day    Diagnostic tests  MRI scan demonstrates degenerative disc disease and degenerative facet disease throughout the lower thoracic and lumbar regions    Patient Stated Goals  improve neck pain, improve knee ROM, be able to manage back pain if it returns                       [facility] Adult PT Treatment/Exercise - 09/18/19 0001      Exercises   Other Exercises   recumbant bike 6 min for LE ROM, slantboard stretch 30 sec X 2 reps,  standing knee flexion lunge stretch 20 sec X 5 reps bilat, Leg press one warm up set of 10 reps with 75 lbs then 125 lbs for 3X12. TRX rows, H abd, and squats all 2X10      Manual Therapy   Manual therapy comments  knee PROM and manual stretching for quads and H.S, elbow mobilizations but this appeared to increased numbness                  PT Long Term Goals - 09/18/19 1002      PT LONG TERM GOAL #1   Title  Patient will increase gross Lt knee flexion movement to 0-115 degrees without pain in order to get up and down off a low chair. ( Target for all goals 6 weeks 08/08/19)    Time  6    Period  Weeks    Status  On-going      PT LONG TERM GOAL #2   Title  Pt will improve lumbar/cervical ROM to [facility].    Time  6    Period  Weeks    Status  On-going      PT LONG TERM GOAL #3   Title  Patient will be independent with final HEP for LE strength  and stability    Time  6    Period  Weeks    Status  On-going      PT LONG TERM GOAL #4   Title  Pt will reduce overall pain levels for neck, back, knee to less than 3/10 on avg with ususal activity.    Time  6    Period  Weeks    Status  On-going      PT LONG TERM GOAL #5   Title  Patient will go up/down 8 steps without increased pain in order to perfrom ADLs and get in/out of houses safely for work ( 09/28/2018)     Baseline  some difficuly going down due to ROM    Time  6    Period  Weeks    Status  On-going            Plan - 09/18/19 0958    Clinical Impression Statement  He has NCV results which suggest some carpal tunnel however he says MD thinks it still may be elbow entrapment but will take a look at the report and follow back up with patient. When PT tried elbow mobilizations it did cause increased numbness in his ulnar distribution. Rest of session focused on general knee strength and ROM with some upper back/posture strengthening without complaints. He will continue to benefit from further PT for his knees.    Personal Factors and Comorbidities  Comorbidity 2    Comorbidities  PMH: lumbar DDD, previous neck fusion, sleep apnea, HTN, bilat TKA    Examination-Activity Limitations  Bend;Squat;Stand;Lift    Examination-Participation Restrictions  Community Activity;Yard Work    [employer] officer  Evolving/Moderate complexity    Rehab Potential  Good    PT Frequency  2x / week    PT Duration  6 weeks    PT Treatment/Interventions  ADLs/Self Care Home Management;Aquatic Therapy;Cryotherapy;Electrical Stimulation;Moist Heat;Ultrasound;Therapeutic activities;Therapeutic exercise;Neuromuscular re-education;Manual techniques;Passive range of motion;Dry needling;Taping    PT Next Visit Plan  previous neck fusion, add more knee stretching into HEP, needs Knee ROM, and general strength/conditioning    PT Home Exercise Plan  Access Code: [email or web address]    Consulted and  Agree with Plan of Care  Patient  Patient will benefit from skilled therapeutic intervention in order to improve the following deficits and impairments:  Decreased activity tolerance, Decreased endurance, Decreased range of motion, Decreased strength, Hypomobility, Increased muscle spasms, Impaired flexibility, Pain  Visit Diagnosis: Cervicalgia  Chronic pain of right knee  Chronic pain of left knee  Localized edema  Acute bilateral low back pain without sciatica  Muscle weakness (generalized)     Problem List Patient Active Problem List   Diagnosis Date Noted  . Irritation of ulnar nerve, left 08/28/2019  . Class 2 obesity due to excess calories without serious comorbidity with body mass index (BMI) of 39.0 to 39.9 in adult 01/08/2019  . Pain in right elbow 12/06/2018  . Primary osteoarthritis of left knee 07/25/2018  . History of left knee replacement 07/25/2018  . Right foot pain 06/22/2018  . Acute left-sided low back pain with left-sided sciatica 06/22/2018  . Osteoarthritis 11/21/2017  . Hyperlipidemia 11/21/2017  . Low testosterone 09/26/2016  . Healthcare maintenance 09/26/2016  . S/P total knee replacement using cement, right 06/01/2016  . Sleep apnea 05/19/2016  . Hypertension 05/19/2016    Debbe Odea, PT,DPT 09/18/2019, 10:04 AM  [facility] Physical Therapy [address] [place], [place], [address] Phone: [phone]   Fax:  [phone]  Name: James Gallegos MRN: [id number] Date of Birth: 1951-02-03

## 2019-09-19 DIAGNOSIS — R29898 Other symptoms and signs involving the musculoskeletal system: Secondary | ICD-10-CM | POA: Diagnosis not present

## 2019-09-19 DIAGNOSIS — R030 Elevated blood-pressure reading, without diagnosis of hypertension: Secondary | ICD-10-CM | POA: Diagnosis not present

## 2019-09-19 DIAGNOSIS — M4722 Other spondylosis with radiculopathy, cervical region: Secondary | ICD-10-CM | POA: Diagnosis not present

## 2019-09-19 DIAGNOSIS — M5030 Other cervical disc degeneration, unspecified cervical region: Secondary | ICD-10-CM | POA: Diagnosis not present

## 2019-09-19 DIAGNOSIS — G5603 Carpal tunnel syndrome, bilateral upper limbs: Secondary | ICD-10-CM | POA: Diagnosis not present

## 2019-09-19 DIAGNOSIS — G5622 Lesion of ulnar nerve, left upper limb: Secondary | ICD-10-CM | POA: Diagnosis not present

## 2019-09-19 DIAGNOSIS — Z6833 Body mass index (BMI) 33.0-33.9, adult: Secondary | ICD-10-CM | POA: Diagnosis not present

## 2019-09-20 DIAGNOSIS — G4733 Obstructive sleep apnea (adult) (pediatric): Secondary | ICD-10-CM | POA: Diagnosis not present

## 2019-09-24 ENCOUNTER — Encounter: Payer: 59 | Admitting: Physical Therapy

## 2019-09-26 ENCOUNTER — Other Ambulatory Visit: Payer: Self-pay

## 2019-09-26 ENCOUNTER — Ambulatory Visit (INDEPENDENT_AMBULATORY_CARE_PROVIDER_SITE_OTHER): Payer: 59 | Admitting: Physical Therapy

## 2019-09-26 DIAGNOSIS — G8929 Other chronic pain: Secondary | ICD-10-CM

## 2019-09-26 DIAGNOSIS — M545 Low back pain, unspecified: Secondary | ICD-10-CM

## 2019-09-26 DIAGNOSIS — M6281 Muscle weakness (generalized): Secondary | ICD-10-CM | POA: Diagnosis not present

## 2019-09-26 DIAGNOSIS — R600 Localized edema: Secondary | ICD-10-CM

## 2019-09-26 DIAGNOSIS — M25561 Pain in right knee: Secondary | ICD-10-CM

## 2019-09-26 DIAGNOSIS — M542 Cervicalgia: Secondary | ICD-10-CM | POA: Diagnosis not present

## 2019-09-26 DIAGNOSIS — M25562 Pain in left knee: Secondary | ICD-10-CM | POA: Diagnosis not present

## 2019-09-26 NOTE — Therapy (Signed)
[facility] Physical Therapy [address] [place], [place], [address] Phone: [phone]   Fax:  [phone]  Physical Therapy Treatment  Patient Details  Name: James Gallegos MRN: [id number] Date of Birth: 1951/03/26 Referring Provider (PT): Durward Fortes   Encounter Date: 09/26/2019  PT End of Session - 09/26/19 1327    Visit Number  13    Number of Visits  22   recert on [id number]   Date for PT Re-Evaluation  10/30/19    Authorization Type  [facility]    PT Start Time  1145    PT Stop Time  1235    PT Time Calculation (min)  50 min    Activity Tolerance  Patient tolerated treatment well    Behavior During Therapy  [facility] for tasks assessed/performed       Past Medical History:  Diagnosis Date  . Arthritis    back & L knee, R foot - lis franc   . Deep vein thrombosis ([place])    after knee surgery in 2004  . Hypertension   . Murmur, cardiac    pt. reports its difficult to auscultate   . Sleep apnea    CPAP- last study- 2014    Past Surgical History:  Procedure Laterality Date  . CERVICAL SPINE SURGERY N/A 1990   C5-6 discectomy  . FINGER SURGERY Right 2008   x 3 index finger  . KNEE ARTHROPLASTY    . KNEE ARTHROSCOPY Bilateral    L 2004, R 2005  . MASS EXCISION Right 01/18/2017   Procedure: EXCISION BENIGN RIGHT NECK MASS;  Surgeon: Rozetta Nunnery, MD;  Location: [place];  Service: ENT;  Laterality: Right;  . MASS EXCISION Bilateral 03/23/2019   Procedure: EXCISIONS OF RIGHT CHEEK LESION, RIGHT POSTERIOR NECK LIPOMA  AND LEFT NECK NODULE;  Surgeon: Rozetta Nunnery, MD;  Location: [place];  Service: ENT;  Laterality: Bilateral;  . NASAL FRACTURE SURGERY    . 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 TOTAL KNEE ARTHROPLASTY Right 06/01/2016   Procedure: TOTAL KNEE ARTHROPLASTY;  Surgeon: [name], MD;  Location: [place];  Service: Orthopedics;  Laterality: Right;  . TOTAL KNEE ARTHROPLASTY Left  07/25/2018   Procedure: LEFT TOTAL KNEE ARTHROPLASTY;  Surgeon: [name], MD;  Location: [place];  Service: Orthopedics;  Laterality: Left;    There were no vitals filed for this visit.  Subjective Assessment - 09/26/19 1318    Subjective  relays maybe more motion in his neck since injection but pain is the same. Also having some pain in his Rt shoulder with resisted flexion or abduction    Pertinent History  PMH: lumbar DDD, previous neck fusion, sleep apnea, HTN, bilat TKA    Limitations  Standing;Walking    How long can you stand comfortably?  depends but sore and painful if he works all day    Diagnostic tests  MRI scan demonstrates degenerative disc disease and degenerative facet disease throughout the lower thoracic and lumbar regions    Patient Stated Goals  improve neck pain, improve knee ROM, be able to manage back pain if it returns                       [facility] Adult PT Treatment/Exercise - 09/26/19 0001      Exercises   Other Exercises   recumbant bike 7 min for LE ROM, slantboard stretch 30 sec X 2 reps,  standing knee  flexion lunge stretch 20 sec X 5 reps bilat, Leg press one warm up set of 10 reps with 75 lbs then 132 lbs for 3X12. Standing flexion and abduction for Rt shoulder, supine Rt shoulder flexion with elbow bent and straight 5 lbs ea      Manual Therapy   Manual therapy comments  knee PROM and manual stretching for quads and H.S             PT Education - 09/26/19 1327    Education Details  theracane    Person(s) Educated  Patient    Methods  Explanation;Demonstration    Comprehension  Verbalized understanding          PT Long Term Goals - 09/18/19 1002      PT LONG TERM GOAL #1   Title  Patient will increase gross Lt knee flexion movement to 0-115 degrees without pain in order to get up and down off a low chair. ( Target for all goals 6 weeks 08/08/19)    Time  6    Period  Weeks    Status  On-going      PT LONG TERM GOAL  #2   Title  Pt will improve lumbar/cervical ROM to [facility].    Time  6    Period  Weeks    Status  On-going      PT LONG TERM GOAL #3   Title  Patient will be independent with final HEP for LE strength and stability    Time  6    Period  Weeks    Status  On-going      PT LONG TERM GOAL #4   Title  Pt will reduce overall pain levels for neck, back, knee to less than 3/10 on avg with ususal activity.    Time  6    Period  Weeks    Status  On-going      PT LONG TERM GOAL #5   Title  Patient will go up/down 8 steps without increased pain in order to perfrom ADLs and get in/out of houses safely for work ( 09/28/2018)     Baseline  some difficuly going down due to ROM    Time  6    Period  Weeks    Status  On-going            Plan - 09/26/19 1327    Clinical Impression Statement  he reports a little more N/T in his Lt hand so recommended he return to see Dr Durward Fortes about this along with new complaints of Rt shoulder pain that was inconclusive with special testing for RTC and impingment. rest of session focused on ROM for his knees and strength for knees/shoulder as tolerated    Personal Factors and Comorbidities  Comorbidity 2    Comorbidities  PMH: lumbar DDD, previous neck fusion, sleep apnea, HTN, bilat TKA    Examination-Activity Limitations  Bend;Squat;Stand;Lift    Examination-Participation Restrictions  Community Activity;Yard Work    [employer] officer  Evolving/Moderate complexity    Rehab Potential  Good    PT Frequency  2x / week    PT Duration  6 weeks    PT Treatment/Interventions  ADLs/Self Care Home Management;Aquatic Therapy;Cryotherapy;Electrical Stimulation;Moist Heat;Ultrasound;Therapeutic activities;Therapeutic exercise;Neuromuscular re-education;Manual techniques;Passive range of motion;Dry needling;Taping    PT Next Visit Plan  previous neck fusion, add more knee stretching into HEP, needs Knee ROM, and general strength/conditioning    PT Home  Exercise Plan  Access Code:  [email or web address]    Consulted and Agree with Plan of Care  Patient       Patient will benefit from skilled therapeutic intervention in order to improve the following deficits and impairments:  Decreased activity tolerance, Decreased endurance, Decreased range of motion, Decreased strength, Hypomobility, Increased muscle spasms, Impaired flexibility, Pain  Visit Diagnosis: Cervicalgia  Chronic pain of right knee  Chronic pain of left knee  Localized edema  Acute bilateral low back pain without sciatica  Muscle weakness (generalized)     Problem List Patient Active Problem List   Diagnosis Date Noted  . Irritation of ulnar nerve, left 08/28/2019  . Class 2 obesity due to excess calories without serious comorbidity with body mass index (BMI) of 39.0 to 39.9 in adult 01/08/2019  . Pain in right elbow 12/06/2018  . Primary osteoarthritis of left knee 07/25/2018  . History of left knee replacement 07/25/2018  . Right foot pain 06/22/2018  . Acute left-sided low back pain with left-sided sciatica 06/22/2018  . Osteoarthritis 11/21/2017  . Hyperlipidemia 11/21/2017  . Low testosterone 09/26/2016  . Healthcare maintenance 09/26/2016  . S/P total knee replacement using cement, right 06/01/2016  . Sleep apnea 05/19/2016  . Hypertension 05/19/2016    Silvestre Mesi 09/26/2019, 1:41 PM  [facility] Physical Therapy [address] [place], [place], [address] Phone: [phone]   Fax:  [phone]  Name: James Gallegos MRN: [id number] Date of Birth: 1951/05/19

## 2019-09-28 ENCOUNTER — Other Ambulatory Visit: Payer: Self-pay | Admitting: Family Medicine

## 2019-09-28 MED FILL — LOSARTAN-HCTZ 100-25 MG TAB: 100-25 | 90 days supply | Qty: 90 | Fill #0

## 2019-10-01 ENCOUNTER — Other Ambulatory Visit: Payer: Self-pay

## 2019-10-01 ENCOUNTER — Ambulatory Visit (INDEPENDENT_AMBULATORY_CARE_PROVIDER_SITE_OTHER): Payer: 59 | Admitting: Rehabilitative and Restorative Service Providers"

## 2019-10-01 ENCOUNTER — Encounter: Payer: Self-pay | Admitting: Rehabilitative and Restorative Service Providers"

## 2019-10-01 DIAGNOSIS — R600 Localized edema: Secondary | ICD-10-CM

## 2019-10-01 DIAGNOSIS — M542 Cervicalgia: Secondary | ICD-10-CM | POA: Diagnosis not present

## 2019-10-01 DIAGNOSIS — M6281 Muscle weakness (generalized): Secondary | ICD-10-CM

## 2019-10-01 DIAGNOSIS — R262 Difficulty in walking, not elsewhere classified: Secondary | ICD-10-CM

## 2019-10-01 DIAGNOSIS — M25561 Pain in right knee: Secondary | ICD-10-CM

## 2019-10-01 DIAGNOSIS — G8929 Other chronic pain: Secondary | ICD-10-CM

## 2019-10-01 NOTE — Therapy (Signed)
[facility] Physical Therapy [address] [place], [place], [address] Phone: [phone]   Fax:  [phone]  Physical Therapy Treatment  Patient Details  Name: James Gallegos MRN: [id number] Date of Birth: 06-24-1950 Referring Provider (PT): Durward Fortes   Encounter Date: 10/01/2019  PT End of Session - 10/01/19 1206    Visit Number  14    Number of Visits  22   recert on [id number]   Date for PT Re-Evaluation  10/30/19    Authorization Type  [facility]    PT Start Time  1100    PT Stop Time  1155    PT Time Calculation (min)  55 min    Activity Tolerance  Patient tolerated treatment well;No increased pain    Behavior During Therapy  WFL for tasks assessed/performed       Past Medical History:  Diagnosis Date  . Arthritis    back & L knee, R foot - lis franc   . Deep vein thrombosis ([place])    after knee surgery in 2004  . Hypertension   . Murmur, cardiac    pt. reports its difficult to auscultate   . Sleep apnea    CPAP- last study- 2014    Past Surgical History:  Procedure Laterality Date  . CERVICAL SPINE SURGERY N/A 1990   C5-6 discectomy  . FINGER SURGERY Right 2008   x 3 index finger  . KNEE ARTHROPLASTY    . KNEE ARTHROSCOPY Bilateral    L 2004, R 2005  . MASS EXCISION Right 01/18/2017   Procedure: EXCISION BENIGN RIGHT NECK MASS;  Surgeon: Rozetta Nunnery, MD;  Location: [place];  Service: ENT;  Laterality: Right;  . MASS EXCISION Bilateral 03/23/2019   Procedure: EXCISIONS OF RIGHT CHEEK LESION, RIGHT POSTERIOR NECK LIPOMA  AND LEFT NECK NODULE;  Surgeon: Rozetta Nunnery, MD;  Location: [place];  Service: ENT;  Laterality: Bilateral;  . NASAL FRACTURE SURGERY    . 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 TOTAL KNEE ARTHROPLASTY Right 06/01/2016   Procedure: TOTAL KNEE ARTHROPLASTY;  Surgeon: [name], MD;  Location: [place];  Service: Orthopedics;  Laterality: Right;  . TOTAL KNEE  ARTHROPLASTY Left 07/25/2018   Procedure: LEFT TOTAL KNEE ARTHROPLASTY;  Surgeon: [name], MD;  Location: [place];  Service: Orthopedics;  Laterality: Left;    There were no vitals filed for this visit.  Subjective Assessment - 10/01/19 1113    Subjective  R knee and R foot are most troublesome.  Sciatica has been absent since December.  Low back stays sore but is at a manageable level.  Annoying hand tingling is constant but sometimes hurts.  Appointment with Dr. Durward Fortes to discuss case and R shoulder in April.    Pertinent History  PMH: lumbar DDD, previous neck fusion, sleep apnea, HTN, bilat TKA    Limitations  Standing;Walking    How long can you stand comfortably?  depends but sore and painful if he works all day    Diagnostic tests  MRI scan demonstrates degenerative disc disease and degenerative facet disease throughout the lower thoracic and lumbar regions    Patient Stated Goals  improve neck pain, improve knee ROM, be able to manage back pain if it returns    Pain Score  4     Pain Orientation  Right    Pain Descriptors / Indicators  Aching    Pain Type  Chronic pain  [facility] Adult PT Treatment/Exercise - 10/01/19 0001      Exercises   Other Exercises   recumbant bike 8 min for LE ROM, slantboard stretch 30 sec X 2 reps,  standing knee flexion lunge stretch 20 sec X 5 reps bilat, Leg press one warm up set of 10 reps with 75 lbs then 132 lbs for 3X12. Side lie ER strengthening with 2# weight 2 sets of 10 with pillow under elbow (added to HEP) for Rt shoulder, supine Rt shoulder IR stretching with PT assist      Manual Therapy   Manual therapy comments  knee PROM and manual stretching for quads and H.S             PT Education - 10/01/19 1203    Education Details  ER strengthening and education regarding shoulder impingement.  Avoid lifting with palm down and use 2 hands when possible.    Person(s) Educated  Patient     Methods  Explanation;Demonstration;Tactile cues;Verbal cues;Handout    Comprehension  Verbalized understanding;Need further instruction;Returned demonstration;Tactile cues required;Verbal cues required          PT Long Term Goals - 09/18/19 1002      PT LONG TERM GOAL #1   Title  Patient will increase gross Lt knee flexion movement to 0-115 degrees without pain in order to get up and down off a low chair. ( Target for all goals 6 weeks 08/08/19)    Time  6    Period  Weeks    Status  On-going      PT LONG TERM GOAL #2   Title  Pt will improve lumbar/cervical ROM to [facility].    Time  6    Period  Weeks    Status  On-going      PT LONG TERM GOAL #3   Title  Patient will be independent with final HEP for LE strength and stability    Time  6    Period  Weeks    Status  On-going      PT LONG TERM GOAL #4   Title  Pt will reduce overall pain levels for neck, back, knee to less than 3/10 on avg with ususal activity.    Time  6    Period  Weeks    Status  On-going      PT LONG TERM GOAL #5   Title  Patient will go up/down 8 steps without increased pain in order to perfrom ADLs and get in/out of houses safely for work ( 09/28/2018)     Baseline  some difficuly going down due to ROM    Time  6    Period  Weeks    Status  On-going            Plan - 10/01/19 1208    Clinical Impression Statement  James Gallegos has multiple chronic issues that will benefit from continued education and therapeutic interventions.  He has an early stage R shoulder impingement that was addressed today with shoulder IR AROM stretching and ER strengthening.  His R knee and foot were also priorities as these are bothering him as well.  HEP of recumbent bike and ER shoulder strengthening 3+X/week recommended.  We will continue to address his neck and spine as needed although both appear to be less of a problem as compared to ares addressed today.    Personal Factors and Comorbidities  Comorbidity 2    Comorbidities   PMH: lumbar DDD, previous neck  fusion, sleep apnea, HTN, bilat TKA    Examination-Activity Limitations  Bend;Squat;Stand;Lift    Examination-Participation Restrictions  Community Activity;Yard Work    [employer] officer  Evolving/Moderate complexity    Rehab Potential  Good    PT Frequency  2x / week    PT Duration  6 weeks    PT Treatment/Interventions  ADLs/Self Care Home Management;Aquatic Therapy;Cryotherapy;Electrical Stimulation;Moist Heat;Ultrasound;Therapeutic activities;Therapeutic exercise;Neuromuscular re-education;Manual techniques;Passive range of motion;Dry needling;Taping    PT Next Visit Plan  previous neck fusion, continue emphasis on knee stretching into HEP, needs Knee ROM, and general strength/conditioning    PT Home Exercise Plan  Access Code: [email or web address]    Consulted and Agree with Plan of Care  Patient       Patient will benefit from skilled therapeutic intervention in order to improve the following deficits and impairments:  Decreased activity tolerance, Decreased endurance, Decreased range of motion, Decreased strength, Hypomobility, Increased muscle spasms, Impaired flexibility, Pain  Visit Diagnosis: Chronic pain of right knee  Localized edema  Muscle weakness (generalized)  Difficulty in walking, not elsewhere classified  Cervicalgia     Problem List Patient Active Problem List   Diagnosis Date Noted  . Irritation of ulnar nerve, left 08/28/2019  . Class 2 obesity due to excess calories without serious comorbidity with body mass index (BMI) of 39.0 to 39.9 in adult 01/08/2019  . Pain in right elbow 12/06/2018  . Primary osteoarthritis of left knee 07/25/2018  . History of left knee replacement 07/25/2018  . Right foot pain 06/22/2018  . Acute left-sided low back pain with left-sided sciatica 06/22/2018  . Osteoarthritis 11/21/2017  . Hyperlipidemia 11/21/2017  . Low testosterone 09/26/2016  . Healthcare maintenance 09/26/2016  . S/P  total knee replacement using cement, right 06/01/2016  . Sleep apnea 05/19/2016  . Hypertension 05/19/2016    Farley Ly MPT 10/01/2019, 12:21 PM  [facility] Physical Therapy [address] [place], [place], [address] Phone: [phone]   Fax:  [phone]  Name: James Gallegos MRN: [id number] Date of Birth: 04-12-1951

## 2019-10-08 ENCOUNTER — Encounter: Payer: 59 | Admitting: Physical Therapy

## 2019-10-09 ENCOUNTER — Other Ambulatory Visit: Payer: Self-pay

## 2019-10-09 ENCOUNTER — Ambulatory Visit (INDEPENDENT_AMBULATORY_CARE_PROVIDER_SITE_OTHER): Payer: 59 | Admitting: Physical Therapy

## 2019-10-09 DIAGNOSIS — M545 Low back pain, unspecified: Secondary | ICD-10-CM

## 2019-10-09 DIAGNOSIS — M25561 Pain in right knee: Secondary | ICD-10-CM

## 2019-10-09 DIAGNOSIS — M25562 Pain in left knee: Secondary | ICD-10-CM

## 2019-10-09 DIAGNOSIS — R600 Localized edema: Secondary | ICD-10-CM | POA: Diagnosis not present

## 2019-10-09 DIAGNOSIS — M6281 Muscle weakness (generalized): Secondary | ICD-10-CM | POA: Diagnosis not present

## 2019-10-09 DIAGNOSIS — M25662 Stiffness of left knee, not elsewhere classified: Secondary | ICD-10-CM

## 2019-10-09 DIAGNOSIS — G8929 Other chronic pain: Secondary | ICD-10-CM

## 2019-10-09 DIAGNOSIS — R262 Difficulty in walking, not elsewhere classified: Secondary | ICD-10-CM | POA: Diagnosis not present

## 2019-10-09 DIAGNOSIS — M542 Cervicalgia: Secondary | ICD-10-CM | POA: Diagnosis not present

## 2019-10-09 NOTE — Therapy (Signed)
[facility] Physical Therapy [address] [place], [place], [address] Phone: [phone]   Fax:  [phone]  Physical Therapy Treatment  Patient Details  Name: James Gallegos MRN: [id number] Date of Birth: 06-28-1950 Referring Provider (PT): Durward Fortes   Encounter Date: 10/09/2019  PT End of Session - 10/09/19 1052    Visit Number  15    Number of Visits  22   recert on [id number]   Date for PT Re-Evaluation  10/30/19    Authorization Type  [facility]    PT Start Time  0845    PT Stop Time  0930    PT Time Calculation (min)  45 min    Activity Tolerance  Patient tolerated treatment well;No increased pain    Behavior During Therapy  WFL for tasks assessed/performed       Past Medical History:  Diagnosis Date  . Arthritis    back & L knee, R foot - lis franc   . Deep vein thrombosis ([place])    after knee surgery in 2004  . Hypertension   . Murmur, cardiac    pt. reports its difficult to auscultate   . Sleep apnea    CPAP- last study- 2014    Past Surgical History:  Procedure Laterality Date  . CERVICAL SPINE SURGERY N/A 1990   C5-6 discectomy  . FINGER SURGERY Right 2008   x 3 index finger  . KNEE ARTHROPLASTY    . KNEE ARTHROSCOPY Bilateral    L 2004, R 2005  . MASS EXCISION Right 01/18/2017   Procedure: EXCISION BENIGN RIGHT NECK MASS;  Surgeon: Rozetta Nunnery, MD;  Location: [place];  Service: ENT;  Laterality: Right;  . MASS EXCISION Bilateral 03/23/2019   Procedure: EXCISIONS OF RIGHT CHEEK LESION, RIGHT POSTERIOR NECK LIPOMA  AND LEFT NECK NODULE;  Surgeon: Rozetta Nunnery, MD;  Location: [place];  Service: ENT;  Laterality: Bilateral;  . NASAL FRACTURE SURGERY    . 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 TOTAL KNEE ARTHROPLASTY Right 06/01/2016   Procedure: TOTAL KNEE ARTHROPLASTY;  Surgeon: [name], MD;  Location: [place];  Service: Orthopedics;  Laterality: Right;  . TOTAL KNEE  ARTHROPLASTY Left 07/25/2018   Procedure: LEFT TOTAL KNEE ARTHROPLASTY;  Surgeon: [name], MD;  Location: [place];  Service: Orthopedics;  Laterality: Left;    There were no vitals filed for this visit.  Subjective Assessment - 10/09/19 1036    Subjective  Somehow aggravated his mid back that started when he was walking dog with sharp pain. Pain is mostly Right sided and bothers him more with twisting as to lay down.  Annoying hand tingling but will have Appointment with Dr. Durward Fortes to discuss this tommorow    Pertinent History  PMH: lumbar DDD, previous neck fusion, sleep apnea, HTN, bilat TKA    Limitations  Standing;Walking    How long can you stand comfortably?  depends but sore and painful if he works all day    Diagnostic tests  MRI scan demonstrates degenerative disc disease and degenerative facet disease throughout the lower thoracic and lumbar regions    Patient Stated Goals  improve neck pain, improve knee ROM, be able to manage back pain if it returns         [facility] PT Assessment - 10/09/19 0001      Assessment   Medical Diagnosis  acute low back pain, chronic neck pain,  knee pain/stffness S/P TKA  Referring Provider (PT)  [place]      AROM   Lumbar Flexion  75%    Lumbar Extension  75%   mild pain   Lumbar - Right Side Bend  50%   mild pain   Lumbar - Left Side Bend  75%    Lumbar - Right Rotation  50%   most pain   Lumbar - Left Rotation  75%      Special Tests   Other special tests  + quadrant testing for facet arthorpathy on Rt that was negative on Rt. Negative SLR test          [facility] Adult PT Treatment/Exercise - 10/09/19 0001      Exercises   Other Exercises   Nu step UE/LE L6 X 7 min. Sidelying thoracic rotations in Lt sidelying over pillow and rotating back to his Rt to gap facets on Rt thoracic region 5 sec hold X 15 reps then moved to seated lumbar-thoracic flexion stretch stool rollouts 5 sec X 15 reps      Manual Therapy   Manual  therapy comments  central and unilateral  Rt T spine mobs T11,10,9 30 sec bouts X 3 ea, STM/T.P.release to Rt Thoracic paraspinals             PT Education - 10/09/19 1051    Education Details  trial of facet gapping exercises into HEP for Rt T spine pain    Person(s) Educated  Patient    Methods  Demonstration;Explanation;Verbal cues;Tactile cues    Comprehension  Verbalized understanding;Returned demonstration          PT Long Term Goals - 09/18/19 1002      PT LONG TERM GOAL #1   Title  Patient will increase gross Lt knee flexion movement to 0-115 degrees without pain in order to get up and down off a low chair. ( Target for all goals 6 weeks 08/08/19)    Time  6    Period  Weeks    Status  On-going      PT LONG TERM GOAL #2   Title  Pt will improve lumbar/cervical ROM to [facility].    Time  6    Period  Weeks    Status  On-going      PT LONG TERM GOAL #3   Title  Patient will be independent with final HEP for LE strength and stability    Time  6    Period  Weeks    Status  On-going      PT LONG TERM GOAL #4   Title  Pt will reduce overall pain levels for neck, back, knee to less than 3/10 on avg with ususal activity.    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 Session focused on manual therapy to this area along with stretches to gap facets on his Rt thoracic area. He showed good understanding of how to do these exercises at home and will  trial them out and PT will assess his response to this new intervention next visit.    Personal Factors and Comorbidities  Comorbidity 2    Comorbidities  PMH: lumbar DDD, previous neck fusion, sleep apnea, HTN, bilat TKA    Examination-Activity Limitations  Bend;Squat;Stand;Lift    Examination-Participation Restrictions  Community Activity;Yard Work    [employer] officer  Evolving/Moderate complexity    Rehab Potential  Good    PT Frequency  2x / week    PT Duration  6 weeks    PT Treatment/Interventions  ADLs/Self Care Home Management;Aquatic Therapy;Cryotherapy;Electrical Stimulation;Moist Heat;Ultrasound;Therapeutic activities;Therapeutic exercise;Neuromuscular re-education;Manual techniques;Passive range of motion;Dry needling;Taping    PT Next Visit Plan  what did MD say, how is back from new exercises, shoulder, neck, knee?previous neck fusion, continue emphasis on knee stretching into HEP, needs Knee ROM, and general strength/conditioning.    PT Home Exercise Plan  Access Code: [email or web address]    Consulted and Agree with Plan of Care  Patient       Patient will benefit from skilled therapeutic intervention in order to improve the following deficits and impairments:  Decreased activity tolerance, Decreased endurance, Decreased range of motion, Decreased strength, Hypomobility, Increased muscle spasms, Impaired flexibility, Pain  Visit Diagnosis: Chronic pain of right knee  Localized edema  Stiffness of left knee, not elsewhere classified  Muscle weakness (generalized)  Difficulty in walking, not elsewhere classified  Cervicalgia  Chronic pain of left knee  Acute bilateral low back pain without sciatica     Problem List Patient Active Problem List   Diagnosis Date Noted  . Irritation of ulnar nerve, left 08/28/2019  . Class 2 obesity due to excess calories without serious comorbidity with body mass index (BMI) of 39.0 to 39.9 in adult 01/08/2019  . Pain in  right elbow 12/06/2018  . Primary osteoarthritis of left knee 07/25/2018  . History of left knee replacement 07/25/2018  . Right foot pain 06/22/2018  . Acute left-sided low back pain with left-sided sciatica 06/22/2018  . Osteoarthritis 11/21/2017  . Hyperlipidemia 11/21/2017  . Low testosterone 09/26/2016  . Healthcare maintenance 09/26/2016  . S/P total knee replacement using cement, right 06/01/2016  . Sleep apnea 05/19/2016  . Hypertension 05/19/2016    Silvestre Mesi 10/09/2019, 10:57 AM  [facility] Physical Therapy [address] [place], [place], [address] Phone: [phone]   Fax:  [phone]  Name: James Gallegos MRN: [id number] Date of Birth: 1950-06-28

## 2019-10-10 ENCOUNTER — Encounter: Payer: Self-pay | Admitting: Orthopaedic Surgery

## 2019-10-10 ENCOUNTER — Ambulatory Visit (INDEPENDENT_AMBULATORY_CARE_PROVIDER_SITE_OTHER): Payer: 59 | Admitting: Orthopaedic Surgery

## 2019-10-10 VITALS — Ht 69.0 in | Wt 222.0 lb

## 2019-10-10 DIAGNOSIS — G8929 Other chronic pain: Secondary | ICD-10-CM | POA: Diagnosis not present

## 2019-10-10 DIAGNOSIS — M7541 Impingement syndrome of right shoulder: Secondary | ICD-10-CM

## 2019-10-10 DIAGNOSIS — M545 Low back pain, unspecified: Secondary | ICD-10-CM | POA: Insufficient documentation

## 2019-10-10 DIAGNOSIS — G5622 Lesion of ulnar nerve, left upper limb: Secondary | ICD-10-CM

## 2019-10-10 DIAGNOSIS — M546 Pain in thoracic spine: Secondary | ICD-10-CM

## 2019-10-10 MED ORDER — METHYLPREDNISOLONE ACETATE 40 MG/ML IJ SUSP
80.0000 mg | INTRAMUSCULAR | Status: AC | PRN
  Administered 2019-10-10: 80 mg via INTRA_ARTICULAR

## 2019-10-10 MED ORDER — LIDOCAINE HCL 2 % IJ SOLN
2.0000 mL | INTRAMUSCULAR | Status: AC | PRN
  Administered 2019-10-10: 2 mL

## 2019-10-10 MED ORDER — BUPIVACAINE HCL 0.5 % IJ SOLN
2.0000 mL | INTRAMUSCULAR | Status: AC | PRN
  Administered 2019-10-10: 11:00:00 2 mL via INTRA_ARTICULAR

## 2019-10-10 NOTE — Progress Notes (Signed)
Office Visit Note   Patient: James Gallegos           Date of Birth: 1950-07-27           MRN: [id number] Visit Date: 10/10/2019              Requested by: Eunice Blase, MD [address] [place],  [place] [address] PCP: Eunice Blase, MD   Assessment & Plan: Visit Diagnoses:  1. Pain in thoracic spine   2. Irritation of ulnar nerve, left   3. Impingement syndrome of right shoulder   4. Chronic midline low back pain without sciatica     Plan: Mr. Show has several issues which we discussed today.  The first with is with his left upper extremity.  He had EMGs and nerve conduction studies performed through Dr. Donnella Bi office with evidence of mild to moderate carpal tunnel syndrome bilaterally.  This does not appear to be symptomatic.  His biggest issue is with numbness and tingling into the ulnar 2 digits of his left hand.  There was a suggestion on the studies that he may have an irritation of the ulnar nerve at Guyon's canal.  By my exam he has tenderness over the ulnar nerve at the left elbow and no pain over Guyon's canal.  Motor exam is intact.  He notes that he basically can "live with it for the moment".  My suggestion would be that if it becomes more symptomatic to decompress the ulnar nerve at the elbow.  I discussed this in detail with him regarding the surgery and expectations.  Second issue is with chronic pain of his right shoulder.  He does have impingement symptoms.  I am going to inject the subacromial space with cortisone and monitor his response.  If no relief will consider an MRI scan to rule out rotator cuff tear.  He also has had chronic problems with his back.  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  There was no evidence of compressive stenosis at any level.  He has been going to physical therapy and been experiencing more  pain in his thoracic spine with some paraspinal tenderness.  I think it might be worthwhile to obtain an MRI of his thoracic spine to be sure there is no thoracic pathology.  He does not appear to have neuropathy.  Office visit over 45 minutes 50% of the time in counseling  Follow-Up Instructions: Return After MRI scan thoracic spine.   Orders:  Orders Placed This Encounter  Procedures  . Large Joint Inj: R subacromial bursa  . MR Thoracic Spine w/o contrast   No orders of the defined types were placed in this encounter.     Procedures: Large Joint Inj: R subacromial bursa on 10/10/2019 11:26 AM Indications: pain and diagnostic evaluation Details: 25 G 1.5 in needle, anterolateral approach  Arthrogram: No  Medications: 2 mL lidocaine 2 %; 2 mL bupivacaine 0.5 %; 80 mg methylPREDNISolone acetate 40 MG/ML Consent was given by the patient. Immediately prior to procedure a time out was called to verify the correct patient, procedure, equipment, support staff and site/side marked as required. Patient was prepped and draped in the usual sterile fashion.       Clinical Data: No additional findings.   Subjective: Chief Complaint  Patient presents with  . Left Hand - Pain  Patient presents today for his  left hand. He has had a prior EMG study done with a spine surgeon and wants to talk with Dr.Antonela Freiman about the results. He has pain and tingling in his left ring and pinky finger. He said that they feel this way almost all the time. Being on the computer makes it worse. He is right hand dominant.  He also has complaints of pain in his lower T-Spine area. He said that it has been hurting on and off for some time. He said that Dr.Mahir Prabhakar examined this area in December and states that it has continued to bother him since then. The pain is located along the midline and to the right side. He takes Ibuprofen, but states that it does not help.  Also having chronic impingement symptoms of his  right shoulder with difficulty raising his arm over his head and sleeping on that side.  Has seen Dr. Sherwood Gambler regarding chronic problems with the cervical spine HPI  Review of Systems   Objective: Vital Signs: Ht 5\' 9"  (1.753 m)   Wt 222 lb (100.7 kg)   BMI 32.78 kg/m   Physical Exam Constitutional:      Appearance: He is well-developed.  Eyes:     Pupils: Pupils are equal, round, and reactive to light.  Pulmonary:     Effort: Pulmonary effort is normal.  Skin:    General: Skin is warm and dry.  Neurological:     Mental Status: He is alert and oriented to person, place, and time.  Psychiatric:        Behavior: Behavior normal.     Ortho Exam awake alert and oriented x3.  Comfortable sitting.  Left arm with local tenderness and positive Tinel's over the ulnar nerve at its groove in the elbow.  Motor exam intact.  Slight decreased sensibility to the ulnar 2 digits.  No digit swelling.  No pain over the median nerve and negative Phalen's and Tinel's over the median nerve.  Full range of motion of elbow.  Straight leg raise negative bilaterally.  Does have tenderness in the lower thoracic spine.  No flank discomfort.  Motor exam intact.  Painless range of motion both hips.  Has had bilateral knee replacements without problem.  Right shoulder with positive impingement and empty can testing.  Pain in the anterior subacromial region without crepitation.  Able to place arm over his head with a circuitous arc of motion Specialty Comments:  No specialty comments available.  Imaging: No results found.   PMFS History: Patient Active Problem List   Diagnosis Date Noted  . Impingement syndrome of right shoulder 10/10/2019  . Low back pain 10/10/2019  . Irritation of ulnar nerve, left 08/28/2019  . Class 2 obesity due to excess calories without serious comorbidity with body mass index (BMI) of 39.0 to 39.9 in adult 01/08/2019  . Pain in right elbow 12/06/2018  . Primary  osteoarthritis of left knee 07/25/2018  . History of left knee replacement 07/25/2018  . Right foot pain 06/22/2018  . Acute left-sided low back pain with left-sided sciatica 06/22/2018  . Osteoarthritis 11/21/2017  . Hyperlipidemia 11/21/2017  . Low testosterone 09/26/2016  . Healthcare maintenance 09/26/2016  . S/P total knee replacement using cement, right 06/01/2016  . Sleep apnea 05/19/2016  . Hypertension 05/19/2016   Past Medical History:  Diagnosis Date  . Arthritis    back & L knee, R foot - lis franc   . Deep vein thrombosis ([place])    after knee surgery in 2004  .  Hypertension   . Murmur, cardiac    pt. reports its difficult to auscultate   . Sleep apnea    CPAP- last study- 2014    Family History  Problem Relation Age of Onset  . Alzheimer's disease Father   . Colon cancer Neg Hx   . Esophageal cancer Neg Hx   . Rectal cancer Neg Hx   . Stomach cancer Neg Hx     Past Surgical History:  Procedure Laterality Date  . CERVICAL SPINE SURGERY N/A 1990   C5-6 discectomy  . FINGER SURGERY Right 2008   x 3 index finger  . KNEE ARTHROPLASTY    . KNEE ARTHROSCOPY Bilateral    L 2004, R 2005  . MASS EXCISION Right 01/18/2017   Procedure: EXCISION BENIGN RIGHT NECK MASS;  Surgeon: Rozetta Nunnery, MD;  Location: [facility];  Service: ENT;  Laterality: Right;  . MASS EXCISION Bilateral 03/23/2019   Procedure: EXCISIONS OF RIGHT CHEEK LESION, RIGHT POSTERIOR NECK LIPOMA  AND LEFT NECK NODULE;  Surgeon: Rozetta Nunnery, MD;  Location: [facility];  Service: ENT;  Laterality: Bilateral;  . NASAL FRACTURE SURGERY    . 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 TOTAL KNEE ARTHROPLASTY Right 06/01/2016   Procedure: TOTAL KNEE ARTHROPLASTY;  Surgeon: [name], MD;  Location: [facility];  Service: Orthopedics;  Laterality: Right;  . TOTAL KNEE ARTHROPLASTY Left 07/25/2018   Procedure: LEFT TOTAL KNEE ARTHROPLASTY;  Surgeon: [name], MD;  Location: [facility];  Service: Orthopedics;  Laterality: Left;   Social History   Occupational History  . Not on file  Tobacco Use  . Smoking status: Never Smoker  . Smokeless tobacco: Never Used  Substance and Sexual Activity  . Alcohol use: Yes    Comment: rarely  . Drug use: No  . Sexual activity: Not on file

## 2019-10-15 ENCOUNTER — Ambulatory Visit (INDEPENDENT_AMBULATORY_CARE_PROVIDER_SITE_OTHER): Payer: 59 | Admitting: Physical Therapy

## 2019-10-15 ENCOUNTER — Other Ambulatory Visit: Payer: Self-pay

## 2019-10-15 ENCOUNTER — Encounter: Payer: Self-pay | Admitting: Physical Therapy

## 2019-10-15 DIAGNOSIS — M6281 Muscle weakness (generalized): Secondary | ICD-10-CM

## 2019-10-15 DIAGNOSIS — M545 Low back pain, unspecified: Secondary | ICD-10-CM

## 2019-10-15 DIAGNOSIS — M542 Cervicalgia: Secondary | ICD-10-CM | POA: Diagnosis not present

## 2019-10-15 DIAGNOSIS — G8929 Other chronic pain: Secondary | ICD-10-CM | POA: Diagnosis not present

## 2019-10-15 DIAGNOSIS — M25561 Pain in right knee: Secondary | ICD-10-CM | POA: Diagnosis not present

## 2019-10-15 DIAGNOSIS — R600 Localized edema: Secondary | ICD-10-CM

## 2019-10-15 DIAGNOSIS — M546 Pain in thoracic spine: Secondary | ICD-10-CM

## 2019-10-15 DIAGNOSIS — M25562 Pain in left knee: Secondary | ICD-10-CM

## 2019-10-15 MED FILL — AMOXICILLIN 500 MG CAPSULE: 500 | 3 days supply | Qty: 12 | Fill #2

## 2019-10-15 NOTE — Therapy (Signed)
[facility] [address] [place], [place], [address] Phone: [phone]   Fax:  [phone]  Physical Therapy Treatment  Patient Details  Name: James Gallegos MRN: [id number] Date of Birth: 04-21-1951 Referring Provider (PT): Durward Fortes   Encounter Date: 10/15/2019  PT End of Session - 10/15/19 1109    Visit Number  16    Number of Visits  22   recert on [id number]   Date for PT Re-Evaluation  10/30/19    Authorization Type  [facility]    PT Start Time  1019    PT Stop Time  1104    PT Time Calculation (min)  45 min    Activity Tolerance  Patient tolerated treatment well;No increased pain    Behavior During Therapy  WFL for tasks assessed/performed       Past Medical History:  Diagnosis Date  . Arthritis    back & L knee, R foot - lis franc   . Deep vein thrombosis ([place])    after knee surgery in 2004  . Hypertension   . Murmur, cardiac    pt. reports its difficult to auscultate   . Sleep apnea    CPAP- last study- 2014    Past Surgical History:  Procedure Laterality Date  . CERVICAL SPINE SURGERY N/A 1990   C5-6 discectomy  . FINGER SURGERY Right 2008   x 3 index finger  . KNEE ARTHROPLASTY    . KNEE ARTHROSCOPY Bilateral    L 2004, R 2005  . MASS EXCISION Right 01/18/2017   Procedure: EXCISION BENIGN RIGHT NECK MASS;  Surgeon: Rozetta Nunnery, MD;  Location: [facility];  Service: ENT;  Laterality: Right;  . MASS EXCISION Bilateral 03/23/2019   Procedure: EXCISIONS OF RIGHT CHEEK LESION, RIGHT POSTERIOR NECK LIPOMA  AND LEFT NECK NODULE;  Surgeon: Rozetta Nunnery, MD;  Location: [facility];  Service: ENT;  Laterality: Bilateral;  . NASAL FRACTURE SURGERY    . 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 TOTAL KNEE ARTHROPLASTY Right 06/01/2016   Procedure: TOTAL KNEE ARTHROPLASTY;  Surgeon: [name], MD;  Location: [facility];  Service: Orthopedics;  Laterality: Right;  . TOTAL KNEE  ARTHROPLASTY Left 07/25/2018   Procedure: LEFT TOTAL KNEE ARTHROPLASTY;  Surgeon: [name], MD;  Location: [facility];  Service: Orthopedics;  Laterality: Left;    There were no vitals filed for this visit.  Subjective Assessment - 10/15/19 1108    Subjective  relays MD wants to have MRI on his thoracic and have surgery for Lt elbow ulnar release. he says overall pain is better in his Rt shoulder after injection but still with some pain if he lifts milk out of fridge. His thoracic pain is overall a little better but still there about 5-6/10    Pertinent History  PMH: lumbar DDD, previous neck fusion, sleep apnea, HTN, bilat TKA    Limitations  Standing;Walking    How long can you stand comfortably?  depends but sore and painful if he works all day    Diagnostic tests  MRI scan demonstrates degenerative disc disease and degenerative facet disease throughout the lower thoracic and lumbar regions    Patient Stated Goals  improve neck pain, improve knee ROM, be able to manage back pain if it returns                       [facility] Adult PT Treatment/Exercise - 10/15/19 0001  Exercises   Other Exercises   Recumbant bike L2 X 7 min. Sidelying thoracic rotations in Lt sidelying over pillow and rotating back to his Rt to gap facets on Rt thoracic region 5 sec hold X 15 reps then moved to standing L stretch for lumbar-thoracic flexion stretch holding onto back of chair sec X 10 reps. Standing rows with green band X 20 reps, Bent over thoracic horizontal abduction strengthening X 10 reps, bent over thoracic rotation stretch for Rt side (reaching through to Lt) 5 sec X 10 reps      Manual Therapy   Manual therapy comments  central and unilateral  Rt T spine mobs T11,10,9 30 sec bouts X 3 ea, STM/T.P.release to Rt Thoracic paraspinals. Bilat knee PROM for flexion and manual quad stretching        PT Long Term Goals - 09/18/19 1002      PT LONG TERM GOAL #1   Title  Patient will  increase gross Lt knee flexion movement to 0-115 degrees without pain in order to get up and down off a low chair. ( [employer] for all goals 6 weeks 08/08/19)    Time  6    Period  Weeks    Status  On-going      PT LONG TERM GOAL #2   Title  Pt will improve lumbar/cervical ROM to [facility].    Time  6    Period  Weeks    Status  On-going      PT LONG TERM GOAL #3   Title  Patient will be independent with final HEP for LE strength and stability    Time  6    Period  Weeks    Status  On-going      PT LONG TERM GOAL #4   Title  Pt will reduce overall pain levels for neck, back, knee to less than 3/10 on avg with ususal activity.    Time  6    Period  Weeks    Status  On-going      PT LONG TERM GOAL #5   Title  Patient will go up/down 8 steps without increased pain in order to perfrom ADLs and get in/out of houses safely for work ( 09/28/2018)     Baseline  some difficuly going down due to ROM    Time  6    Period  Weeks    Status  On-going            Plan - 10/15/19 1110    Clinical Impression Statement  continued with Rt thoracic stretching,mobility, light strength along with knee ROM . He will have MRI for this thoracic and possible elbow surgery so PT will hold off on his treatment plan until at least thoracic MRI.    Personal Factors and Comorbidities  Comorbidity 2    Comorbidities  PMH: lumbar DDD, previous neck fusion, sleep apnea, HTN, bilat TKA    Examination-Activity Limitations  Bend;Squat;Stand;Lift    Examination-Participation Restrictions  Community Activity;Yard Work    [employer]  Evolving/Moderate complexity    Rehab Potential  Good    PT Frequency  2x / week    PT Duration  6 weeks    PT Treatment/Interventions  ADLs/Self Care Home Management;Aquatic Therapy;Cryotherapy;Electrical Stimulation;Moist Heat;Ultrasound;Therapeutic activities;Therapeutic exercise;Neuromuscular re-education;Manual techniques;Passive range of motion;Dry  needling;Taping    PT Next Visit Plan  what did MD say, how is back from new exercises, shoulder, neck, knee?previous neck fusion, continue emphasis on knee  stretching into HEP, needs Knee ROM, and general strength/conditioning.    PT Home Exercise Plan  Access Code: [email or web address]    Consulted and Agree with Plan of Care  Patient       Patient will benefit from skilled therapeutic intervention in order to improve the following deficits and impairments:  Decreased activity tolerance, Decreased endurance, Decreased range of motion, Decreased strength, Hypomobility, Increased muscle spasms, Impaired flexibility, Pain  Visit Diagnosis: Chronic pain of right knee  Localized edema  Muscle weakness (generalized)  Cervicalgia  Chronic pain of left knee  Acute bilateral low back pain without sciatica  Pain in thoracic spine     Problem List Patient Active Problem List   Diagnosis Date Noted  . Impingement syndrome of right shoulder 10/10/2019  . Low back pain 10/10/2019  . Irritation of ulnar nerve, left 08/28/2019  . Class 2 obesity due to excess calories without serious comorbidity with body mass index (BMI) of 39.0 to 39.9 in adult 01/08/2019  . Pain in right elbow 12/06/2018  . Primary osteoarthritis of left knee 07/25/2018  . History of left knee replacement 07/25/2018  . Right foot pain 06/22/2018  . Acute left-sided low back pain with left-sided sciatica 06/22/2018  . Osteoarthritis 11/21/2017  . Hyperlipidemia 11/21/2017  . Low testosterone 09/26/2016  . Healthcare maintenance 09/26/2016  . S/P total knee replacement using cement, right 06/01/2016  . Sleep apnea 05/19/2016  . Hypertension 05/19/2016    Silvestre Mesi 10/15/2019, 11:18 AM  [facility] [address] [place], [place], [address] Phone: [phone]   Fax:  [phone]  Name: FIDENCIO ROGUS MRN: [id number] Date of Birth: 1951/03/13

## 2019-10-31 ENCOUNTER — Encounter: Payer: Self-pay | Admitting: Family Medicine

## 2019-11-10 ENCOUNTER — Ambulatory Visit
Admission: RE | Admit: 2019-11-10 | Discharge: 2019-11-10 | Disposition: A | Discharge status: Home / Self Care | Payer: 59 | Source: Ambulatory Visit | Attending: Orthopaedic Surgery | Admitting: Orthopaedic Surgery

## 2019-11-10 DIAGNOSIS — M546 Pain in thoracic spine: Secondary | ICD-10-CM

## 2019-11-13 ENCOUNTER — Other Ambulatory Visit: Payer: Self-pay

## 2019-11-13 ENCOUNTER — Ambulatory Visit (INDEPENDENT_AMBULATORY_CARE_PROVIDER_SITE_OTHER): Payer: 59 | Admitting: Orthopaedic Surgery

## 2019-11-13 ENCOUNTER — Encounter: Payer: Self-pay | Admitting: Orthopaedic Surgery

## 2019-11-13 VITALS — Ht 69.0 in | Wt 222.0 lb

## 2019-11-13 DIAGNOSIS — M5442 Lumbago with sciatica, left side: Secondary | ICD-10-CM

## 2019-11-13 NOTE — Progress Notes (Signed)
Office Visit Note   Patient: James Gallegos           Date of Birth: 1950/08/19           MRN: [id number] Visit Date: 11/13/2019              Requested by: Eunice Blase, MD [address] [place],  [place] [address] PCP: Eunice Blase, MD   Assessment & Plan: Visit Diagnoses:  1. Acute left-sided low back pain with left-sided sciatica     Plan: MRI of the thoracic spine was performed without contrast.  There was mild thoracic degenerative disc disease without spinal canal or neural foraminal stenosis.  No acute abnormalities.  James Gallegos has had several episodes of acute pain in the area of his thoracic spine and flank.  He has had an MRI scan of his lumbar spine as well that was  consistent with degenerative arthritis.  Without stenosis.  Long discussion today regarding the negative findings.  He is going to check with Dr. Junius Roads, his primary care physician, regarding other etiologies i.e. potential GI or GU.  Follow-Up Instructions: Return if symptoms worsen or fail to improve.   Orders:  No orders of the defined types were placed in this encounter.  No orders of the defined types were placed in this encounter.     Procedures: No procedures performed   Clinical Data: No additional findings.   Subjective: Chief Complaint  Patient presents with  . Middle Back - Pain, Follow-up    MRI review  Patient presents today for follow up on his back pain. He had an MRI of his T-Spine on 11-10-2019. He said that the pain comes and goes. He said that his back is feeling fine today.   HPI  Review of Systems  Constitutional: Negative for fatigue.  HENT: Negative for ear pain.   Eyes: Negative for pain.  Respiratory: Negative for shortness of breath.   Cardiovascular: Negative for leg swelling.  Gastrointestinal: Negative for constipation and diarrhea.  Endocrine: Negative for cold intolerance and heat intolerance.  Genitourinary: Negative for difficulty urinating.    Musculoskeletal: Negative for joint swelling.  Skin: Negative for rash.  Allergic/Immunologic: Negative for food allergies.  Neurological: Negative for weakness.  Hematological: Does not bruise/bleed easily.  Psychiatric/Behavioral: Negative for sleep disturbance.     Objective: Vital Signs: Ht 5\' 9"  (1.753 m)   Wt 222 lb (100.7 kg)   BMI 32.78 kg/m   Physical Exam Constitutional:      Appearance: He is well-developed.  Eyes:     Pupils: Pupils are equal, round, and reactive to light.  Pulmonary:     Effort: Pulmonary effort is normal.  Skin:    General: Skin is warm and dry.  Neurological:     Mental Status: He is alert and oriented to person, place, and time.  Psychiatric:        Behavior: Behavior normal.     Ortho Exam awake alert and oriented x3.  Comfortable sitting.  No percussible tenderness of the thoracic or lumbar spine.  No pain in either flank.  Specialty Comments:  No specialty comments available.  Imaging: No results found.   PMFS History: Patient Active Problem List   Diagnosis Date Noted  . Impingement syndrome of right shoulder 10/10/2019  . Low back pain 10/10/2019  . Irritation of ulnar nerve, left 08/28/2019  . Class 2 obesity due to excess calories without serious comorbidity with body mass index (BMI) of 39.0 to 39.9 in  adult 01/08/2019  . Pain in right elbow 12/06/2018  . Primary osteoarthritis of left knee 07/25/2018  . History of left knee replacement 07/25/2018  . Right foot pain 06/22/2018  . Acute left-sided low back pain with left-sided sciatica 06/22/2018  . Osteoarthritis 11/21/2017  . Hyperlipidemia 11/21/2017  . Low testosterone 09/26/2016  . Healthcare maintenance 09/26/2016  . S/P total knee replacement using cement, right 06/01/2016  . Sleep apnea 05/19/2016  . Hypertension 05/19/2016   Past Medical History:  Diagnosis Date  . Arthritis    back & L knee, R foot - lis franc   . Deep vein thrombosis ([place])    after  knee surgery in 2004  . Hypertension   . Murmur, cardiac    pt. reports its difficult to auscultate   . Sleep apnea    CPAP- last study- 2014    Family History  Problem Relation Age of Onset  . Alzheimer's disease Father   . Colon cancer Neg Hx   . Esophageal cancer Neg Hx   . Rectal cancer Neg Hx   . Stomach cancer Neg Hx     Past Surgical History:  Procedure Laterality Date  . CERVICAL SPINE SURGERY N/A 1990   C5-6 discectomy  . FINGER SURGERY Right 2008   x 3 index finger  . KNEE ARTHROPLASTY    . KNEE ARTHROSCOPY Bilateral    L 2004, R 2005  . MASS EXCISION Right 01/18/2017   Procedure: EXCISION BENIGN RIGHT NECK MASS;  Surgeon: Rozetta Nunnery, MD;  Location: [facility];  Service: ENT;  Laterality: Right;  . MASS EXCISION Bilateral 03/23/2019   Procedure: EXCISIONS OF RIGHT CHEEK LESION, RIGHT POSTERIOR NECK LIPOMA  AND LEFT NECK NODULE;  Surgeon: Rozetta Nunnery, MD;  Location: [facility];  Service: ENT;  Laterality: Bilateral;  . NASAL FRACTURE SURGERY    . 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 TOTAL KNEE ARTHROPLASTY Right 06/01/2016   Procedure: TOTAL KNEE ARTHROPLASTY;  Surgeon: [name], MD;  Location: [facility];  Service: Orthopedics;  Laterality: Right;  . TOTAL KNEE ARTHROPLASTY Left 07/25/2018   Procedure: LEFT TOTAL KNEE ARTHROPLASTY;  Surgeon: [name], MD;  Location: [facility];  Service: Orthopedics;  Laterality: Left;   Social History   Occupational History  . Not on file  Tobacco Use  . Smoking status: Never Smoker  . Smokeless tobacco: Never Used  Substance and Sexual Activity  . Alcohol use: Yes    Comment: rarely  . Drug use: No  . Sexual activity: Not on file

## 2019-11-16 ENCOUNTER — Ambulatory Visit (INDEPENDENT_AMBULATORY_CARE_PROVIDER_SITE_OTHER): Payer: 59 | Admitting: Family Medicine

## 2019-11-16 ENCOUNTER — Other Ambulatory Visit: Payer: Self-pay

## 2019-11-16 ENCOUNTER — Encounter: Payer: Self-pay | Admitting: Family Medicine

## 2019-11-16 DIAGNOSIS — M546 Pain in thoracic spine: Secondary | ICD-10-CM

## 2019-11-16 DIAGNOSIS — R82998 Other abnormal findings in urine: Secondary | ICD-10-CM | POA: Diagnosis not present

## 2019-11-16 DIAGNOSIS — L600 Ingrowing nail: Secondary | ICD-10-CM

## 2019-11-16 NOTE — Progress Notes (Signed)
Office Visit Note   Patient: James Gallegos           Date of Birth: [birth year]           MRN: [id number] Visit Date: 11/16/2019 Requested by: Eunice Blase, MD [address] [place],  [place] [address] PCP: Eunice Blase, MD  Subjective: Chief Complaint  Patient presents with  . Middle Back - Pain    Referred by Dr. Durward Fortes - intermittent stabbing pain in the right side of the middle back, up to the scapula. "Debilitating." Has had MRIs. Episodes last a couple hours and then go away. Been going to PT.    HPI: He is here with a couple concerns.  He has been dealing with thoracic pain for at least 6 months.  He went to physical therapy multiple times.  There was not any myofascial release done for his pain.  He had an MRI scan which did not show any compressive lesions.  He is seeing me to rule out any possible medical pathology that might be causing this.  He denies any issues with abdominal pain, he does not notice any correlation between eating and having his thoracic pain.  No unintentional weight change.  He does note that his urine is sometimes dark, but he attributes this to a supplement he has taken for a long time.  He also has a problem with his great toe since childhood.  He has recurrent ingrown nail that he typically manages on his own by cutting the nail and pulling out the ingrown part.  Recently he cut his skin and it bled quite a bit.  Is healing now, but he asked me to look at it.              ROS:   All other systems were reviewed and are negative.  Objective: Vital Signs: There were no vitals taken for this visit.  Physical Exam:  General:  Alert and oriented, in no acute distress. Pulm:  Breathing unlabored. Psy:  Normal mood, congruent affect.  Back: He has a tender myofascial trigger point in the paraspinous muscles near the T9-11 level to the right.  This seems to reproduce his pain. Abdomen: Protuberant, no hepatosplenomegaly, no significant  tenderness. Right great toe: Nail looks good right now, no sign of infection.  Imaging: No results found.  Assessment & Plan: 1.  Chronic right-sided thoracic pain, suspect myofascial pain.  Cannot rule out gallbladder disease, kidney stone. -We will order urinalysis to look for blood.  If present, then CT scan.  Abdominal ultrasound to evaluate for gallbladder disease.  If all testing is negative, then referral back to [facility] in physical therapy for dry needling and myofascial release techniques.  2.  Chronic ingrown toenail right great toe -Referral to Dr. Sharol Given for consideration of a more permanent removal with distraction of the matrix.     Procedures: No procedures performed  No notes on file     PMFS History: Patient Active Problem List   Diagnosis Date Noted  . Impingement syndrome of right shoulder 10/10/2019  . Low back pain 10/10/2019  . Irritation of ulnar nerve, left 08/28/2019  . Class 2 obesity due to excess calories without serious comorbidity with body mass index (BMI) of 39.0 to 39.9 in adult 01/08/2019  . Pain in right elbow 12/06/2018  . Primary osteoarthritis of left knee 07/25/2018  . History of left knee replacement 07/25/2018  . Right foot pain 06/22/2018  . Acute left-sided low back  pain with left-sided sciatica 06/22/2018  . Osteoarthritis 11/21/2017  . Hyperlipidemia 11/21/2017  . Low testosterone 09/26/2016  . Healthcare maintenance 09/26/2016  . S/P total knee replacement using cement, right 06/01/2016  . Sleep apnea 05/19/2016  . Hypertension 05/19/2016   Past Medical History:  Diagnosis Date  . Arthritis    back & L knee, R foot - lis franc   . Deep vein thrombosis ([place])    after knee surgery in 2004  . Hypertension   . Murmur, cardiac    pt. reports its difficult to auscultate   . Sleep apnea    CPAP- last study- 2014    Family History  Problem Relation Age of Onset  . Alzheimer's disease Father   . Colon cancer Neg Hx   .  Esophageal cancer Neg Hx   . Rectal cancer Neg Hx   . Stomach cancer Neg Hx     Past Surgical History:  Procedure Laterality Date  . CERVICAL SPINE SURGERY N/A 1990   C5-6 discectomy  . FINGER SURGERY Right 2008   x 3 index finger  . KNEE ARTHROPLASTY    . KNEE ARTHROSCOPY Bilateral    L 2004, R 2005  . MASS EXCISION Right 01/18/2017   Procedure: EXCISION BENIGN RIGHT NECK MASS;  Surgeon: Rozetta Nunnery, MD;  Location: [facility];  Service: ENT;  Laterality: Right;  . MASS EXCISION Bilateral 03/23/2019   Procedure: EXCISIONS OF RIGHT CHEEK LESION, RIGHT POSTERIOR NECK LIPOMA  AND LEFT NECK NODULE;  Surgeon: Rozetta Nunnery, MD;  Location: [facility];  Service: ENT;  Laterality: Bilateral;  . NASAL FRACTURE SURGERY    . 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 TOTAL KNEE ARTHROPLASTY Right 06/01/2016   Procedure: TOTAL KNEE ARTHROPLASTY;  Surgeon: [name], MD;  Location: [facility];  Service: Orthopedics;  Laterality: Right;  . TOTAL KNEE ARTHROPLASTY Left 07/25/2018   Procedure: LEFT TOTAL KNEE ARTHROPLASTY;  Surgeon: [name], MD;  Location: [facility];  Service: Orthopedics;  Laterality: Left;   Social History   Occupational History  . Not on file  Tobacco Use  . Smoking status: Never Smoker  . Smokeless tobacco: Never Used  Substance and Sexual Activity  . Alcohol use: Yes    Comment: rarely  . Drug use: No  . Sexual activity: Not on file

## 2019-11-17 LAB — URINALYSIS, ROUTINE W REFLEX MICROSCOPIC
Bilirubin Urine: NEGATIVE
Glucose, UA: NEGATIVE
Hgb urine dipstick: NEGATIVE
Ketones, ur: NEGATIVE
Leukocytes,Ua: NEGATIVE
Nitrite: NEGATIVE
Protein, ur: NEGATIVE
Specific Gravity, Urine: 1.01 (ref 1.001–1.03)
pH: 6.5 (ref 5.0–8.0)

## 2019-11-19 ENCOUNTER — Telehealth: Payer: Self-pay | Admitting: Family Medicine

## 2019-11-19 NOTE — Telephone Encounter (Signed)
No blood in urine.  This makes the diagnosis of kidney stones unlikely.  Will notify you when other results are in.

## 2019-11-20 ENCOUNTER — Ambulatory Visit (INDEPENDENT_AMBULATORY_CARE_PROVIDER_SITE_OTHER): Payer: 59 | Admitting: Orthopedic Surgery

## 2019-11-20 ENCOUNTER — Other Ambulatory Visit: Payer: Self-pay

## 2019-11-20 ENCOUNTER — Encounter: Payer: Self-pay | Admitting: Orthopedic Surgery

## 2019-11-20 VITALS — Ht 69.0 in | Wt 222.0 lb

## 2019-11-20 DIAGNOSIS — L03031 Cellulitis of right toe: Secondary | ICD-10-CM | POA: Diagnosis not present

## 2019-11-22 ENCOUNTER — Encounter: Payer: Self-pay | Admitting: Orthopedic Surgery

## 2019-11-22 NOTE — Progress Notes (Signed)
Office Visit Note   Patient: James Gallegos           Date of Birth: [birth year]           MRN: [id number] Visit Date: 11/20/2019              Requested by: Eunice Blase, MD [address] [place],  [place] [address] PCP: Eunice Blase, MD  Chief Complaint  Patient presents with  . Right Foot - Pain    Ingrown nail       HPI: Patient is a 69 year old gentleman who is seen for evaluation for recurrent paronychial infections this time over the lateral border of the right great toe.  Patient states that he has been trimming his nails himself and now has pain to palpation with redness and swelling.  Patient states there is something left under the skin.  Assessment & Plan: Visit Diagnoses:  1. Paronychia of great toe of right foot     Plan: Patient will use the postoperative shoe weightbearing as tolerated start antibiotic ointment dressing change with a Band-Aid tomorrow.  Follow-Up Instructions: Return if symptoms worsen or fail to improve.   Ortho Exam  Patient is alert, oriented, no adenopathy, well-dressed, normal affect, normal respiratory effort. Examination patient has redness and swelling over the lateral border of the right great toe with an ingrown nail he has a paronychial infection this is tender to palpation he has a good dorsalis pedis pulse there is no ascending cellulitis.  After informed consent patient's right great toe was sterilely prepped using Betadine he underwent a digital block with 10 cc of 1% lidocaine plain after adequate levels anesthesia were obtained the lateral border of the right great toenail was excised.  A sterile dressing was applied.  Patient tolerated the procedure well.  Imaging: No results found. No images are attached to the encounter.  Labs: Lab Results  Component Value Date   REPTSTATUS 07/15/2018 FINAL 07/14/2018   CULT  07/14/2018    NO GROWTH Performed at [facility], [place] [address] [place], [place] [address]       Lab Results  Component Value Date   ALBUMIN 4.1 07/14/2018   ALBUMIN 4.4 10/07/2016   ALBUMIN 4.2 05/21/2016    No results found for: MG No results found for: VD25OH  No results found for: PREALBUMIN CBC EXTENDED Latest Ref Rng & Units 01/25/2019 07/26/2018 07/26/2018  WBC 3.8 - 10.8 Thousand/uL 5.7 7.7 7.7  RBC 4.20 - 5.80 Million/uL 4.49 3.41(L) 3.49(L)  HGB 13.2 - 17.1 g/dL 14.8 10.8(L) 11.0(L)  HCT 38 - 50 % 42.5 31.5(L) 33.0(L)  PLT 140 - 400 Thousand/uL 197 137(L) 143(L)  NEUTROABS 1,500 - 7,800 cells/uL 3,420 - -  LYMPHSABS 850 - 3,900 cells/uL 1,602 - -     Body mass index is 32.78 kg/m.  Orders:  No orders of the defined types were placed in this encounter.  No orders of the defined types were placed in this encounter.    Procedures: No procedures performed  Clinical Data: No additional findings.  ROS:  All other systems negative, except as noted in the HPI. Review of Systems  Objective: Vital Signs: Ht 5\' 9"  (1.753 m)   Wt 222 lb (100.7 kg)   BMI 32.78 kg/m   Specialty Comments:  No specialty comments available.  PMFS History: Patient Active Problem List   Diagnosis Date Noted  . Impingement syndrome of right shoulder 10/10/2019  . Low back pain 10/10/2019  .  Irritation of ulnar nerve, left 08/28/2019  . Class 2 obesity due to excess calories without serious comorbidity with body mass index (BMI) of 39.0 to 39.9 in adult 01/08/2019  . Pain in right elbow 12/06/2018  . Primary osteoarthritis of left knee 07/25/2018  . History of left knee replacement 07/25/2018  . Right foot pain 06/22/2018  . Acute left-sided low back pain with left-sided sciatica 06/22/2018  . Osteoarthritis 11/21/2017  . Hyperlipidemia 11/21/2017  . Low testosterone 09/26/2016  . Healthcare maintenance 09/26/2016  . S/P total knee replacement using cement, right 06/01/2016  . Sleep apnea 05/19/2016  . Hypertension 05/19/2016   Past Medical History:  Diagnosis  Date  . Arthritis    back & L knee, R foot - lis franc   . Deep vein thrombosis ([place])    after knee surgery in 2004  . Hypertension   . Murmur, cardiac    pt. reports its difficult to auscultate   . Sleep apnea    CPAP- last study- 2014    Family History  Problem Relation Age of Onset  . Alzheimer's disease Father   . Colon cancer Neg Hx   . Esophageal cancer Neg Hx   . Rectal cancer Neg Hx   . Stomach cancer Neg Hx     Past Surgical History:  Procedure Laterality Date  . CERVICAL SPINE SURGERY N/A 1990   C5-6 discectomy  . FINGER SURGERY Right 2008   x 3 index finger  . KNEE ARTHROPLASTY    . KNEE ARTHROSCOPY Bilateral    L 2004, R 2005  . MASS EXCISION Right 01/18/2017   Procedure: EXCISION BENIGN RIGHT NECK MASS;  Surgeon: Rozetta Nunnery, MD;  Location: [facility];  Service: ENT;  Laterality: Right;  . MASS EXCISION Bilateral 03/23/2019   Procedure: EXCISIONS OF RIGHT CHEEK LESION, RIGHT POSTERIOR NECK LIPOMA  AND LEFT NECK NODULE;  Surgeon: Rozetta Nunnery, MD;  Location: [facility];  Service: ENT;  Laterality: Bilateral;  . NASAL FRACTURE SURGERY    . 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 TOTAL KNEE ARTHROPLASTY Right 06/01/2016   Procedure: TOTAL KNEE ARTHROPLASTY;  Surgeon: [name], MD;  Location: [facility];  Service: Orthopedics;  Laterality: Right;  . TOTAL KNEE ARTHROPLASTY Left 07/25/2018   Procedure: LEFT TOTAL KNEE ARTHROPLASTY;  Surgeon: [name], MD;  Location: [facility];  Service: Orthopedics;  Laterality: Left;   Social History   Occupational History  . Not on file  Tobacco Use  . Smoking status: Never Smoker  . Smokeless tobacco: Never Used  Vaping Use  . Vaping Use: Never used  Substance and Sexual Activity  . Alcohol use: Yes    Comment: rarely  . Drug use: No  . Sexual activity: Not on file

## 2019-11-26 ENCOUNTER — Ambulatory Visit
Admission: RE | Admit: 2019-11-26 | Discharge: 2019-11-26 | Disposition: A | Discharge status: Home / Self Care | Payer: 59 | Source: Ambulatory Visit | Attending: Family Medicine | Admitting: Family Medicine

## 2019-11-26 ENCOUNTER — Telehealth: Payer: Self-pay | Admitting: Family Medicine

## 2019-11-26 DIAGNOSIS — K7689 Other specified diseases of liver: Secondary | ICD-10-CM | POA: Diagnosis not present

## 2019-11-26 DIAGNOSIS — M546 Pain in thoracic spine: Secondary | ICD-10-CM

## 2019-11-26 NOTE — Telephone Encounter (Signed)
Ultrasound shows changes of "fatty liver disease" (steatosis).  No sign of gallbladder disease.    If symptoms don't improve with physical therapy dry needling, we will order an abdominal CT scan.

## 2019-11-26 NOTE — Addendum Note (Signed)
Addended by: Hortencia Pilar on: 11/26/2019 04:43 PM   Modules accepted: Orders

## 2019-12-03 ENCOUNTER — Encounter: Payer: Self-pay | Admitting: Physical Therapy

## 2019-12-03 ENCOUNTER — Other Ambulatory Visit: Payer: Self-pay

## 2019-12-03 ENCOUNTER — Ambulatory Visit (INDEPENDENT_AMBULATORY_CARE_PROVIDER_SITE_OTHER): Payer: 59 | Admitting: Physical Therapy

## 2019-12-03 DIAGNOSIS — M546 Pain in thoracic spine: Secondary | ICD-10-CM | POA: Diagnosis not present

## 2019-12-03 DIAGNOSIS — M542 Cervicalgia: Secondary | ICD-10-CM

## 2019-12-03 DIAGNOSIS — R29898 Other symptoms and signs involving the musculoskeletal system: Secondary | ICD-10-CM

## 2019-12-03 NOTE — Therapy (Signed)
[facility] [address] [place], [place], [address] Phone: [phone]   Fax:  [phone]  Physical Therapy Evaluation  Patient Details  Name: James Gallegos MRN: [id number] Date of Birth: 05-01-1951 Referring Provider (PT): Dr. Eunice Blase   Encounter Date: 12/03/2019   PT End of Session - 12/03/19 1155    Visit Number 1    Number of Visits 12    Date for PT Re-Evaluation 01/14/20    Authorization Type [facility]    PT Start Time 0802    PT Stop Time 3545    PT Time Calculation (min) 40 min    Activity Tolerance Patient tolerated treatment well;No increased pain    Behavior During Therapy WFL for tasks assessed/performed           Past Medical History:  Diagnosis Date  . Arthritis    back & L knee, R foot - lis franc   . Deep vein thrombosis ([place])    after knee surgery in 2004  . Hypertension   . Murmur, cardiac    pt. reports its difficult to auscultate   . Sleep apnea    CPAP- last study- 2014    Past Surgical History:  Procedure Laterality Date  . CERVICAL SPINE SURGERY N/A 1990   C5-6 discectomy  . FINGER SURGERY Right 2008   x 3 index finger  . KNEE ARTHROPLASTY    . KNEE ARTHROSCOPY Bilateral    L 2004, R 2005  . MASS EXCISION Right 01/18/2017   Procedure: EXCISION BENIGN RIGHT NECK MASS;  Surgeon: Rozetta Nunnery, MD;  Location: [facility];  Service: ENT;  Laterality: Right;  . MASS EXCISION Bilateral 03/23/2019   Procedure: EXCISIONS OF RIGHT CHEEK LESION, RIGHT POSTERIOR NECK LIPOMA  AND LEFT NECK NODULE;  Surgeon: Rozetta Nunnery, MD;  Location: [facility];  Service: ENT;  Laterality: Bilateral;  . NASAL FRACTURE SURGERY    . 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 TOTAL KNEE ARTHROPLASTY Right 06/01/2016   Procedure: TOTAL KNEE ARTHROPLASTY;  Surgeon: [name], MD;  Location: [facility];  Service: Orthopedics;  Laterality: Right;  . TOTAL KNEE ARTHROPLASTY  Left 07/25/2018   Procedure: LEFT TOTAL KNEE ARTHROPLASTY;  Surgeon: [name], MD;  Location: [facility];  Service: Orthopedics;  Laterality: Left;    There were no vitals filed for this visit.    Subjective Assessment - 12/03/19 0807    Subjective Pt returns to PT with continued Rt sided thoracic pain.  Pt reports pain can be debilitating at times.  He's had multiple imaging studies and everything appears to be negative.  Pt is here for possible dry needling.    Pertinent History PMH: lumbar DDD, previous neck fusion, sleep apnea, HTN, bilat TKA    Limitations Standing;Walking    How long can you stand comfortably? depends but sore and painful if he works all day    Diagnostic tests MRI scan demonstrates degenerative disc disease and degenerative facet disease throughout the lower thoracic and lumbar regions    Patient Stated Goals improve pain in mid back    Currently in Pain? Yes    Pain Score 0-No pain   up to 10/10   Pain Location Back    Pain Orientation Right;Mid    Pain Descriptors / Indicators Stabbing    Pain Type Chronic pain    Pain Onset More than a month ago    Pain Frequency Intermittent  Aggravating Factors  sneezing or coughing    Pain Relieving Factors rest              [facility] PT Assessment - 12/03/19 6789      Assessment   Medical Diagnosis pain in thoracic spine    Referring Provider (PT) Dr. Legrand Como [place]    Onset Date/Surgical Date --   7-8 months   Hand Dominance Right    Next MD Visit after PT    Prior Therapy with Aaron Edelman - many sessions      Precautions   Precautions None      Restrictions   Weight Bearing Restrictions No      Balance Screen   Has the patient fallen in the past 6 months No    Has the patient had a decrease in activity level because of a fear of falling?  No    Is the patient reluctant to leave their home because of a fear of falling?  No      Home Ecologist residence      Prior Function     Level of Independence Independent    Vocation Full time employment    Vocation Requirements works as Soil scientist    Leisure relax, watch TV, travel      Cognition   Overall Cognitive Status Within Functional Limits for tasks assessed      Posture/Postural Control   Posture/Postural Control Postural limitations    Postural Limitations Rounded Shoulders;Forward head      AROM   Overall AROM Comments thoracic spine WNL with reproduction of Rt sided pain with Rt side bending      Palpation   Spinal mobility hypomobile T7-11 with reproduction of pain with CPA mobs T 8-10    Palpation comment active trigger points in Rt rhomboids and paraspinals                      Objective measurements completed on examination: See above findings.       [facility] Adult PT Treatment/Exercise - 12/03/19 0812      Manual Therapy   Manual Therapy Soft tissue mobilization    Manual therapy comments skilled palpation and monitoring of soft tissue during DN    Soft tissue mobilization Rt rhomboids and thoracic paraspinals            Trigger Point Dry Needling - 12/03/19 1154    Consent Given? Yes    Education Handout Provided Yes    Muscles Treated Upper Quadrant Rhomboids    Muscles Treated Back/Hip Thoracic multifidi    Rhomboids Response Twitch response elicited;Palpable increased muscle length    Thoracic multifidi response Twitch response elicited;Palpable increased muscle length                PT Education - 12/03/19 1155    Education Details DN - sent via inbasket    Person(s) Educated Patient    Methods Explanation;Handout    Comprehension Verbalized understanding               PT Long Term Goals - 12/03/19 1158      PT LONG TERM GOAL #1   Title independent with HEP    Time 6    Period Weeks    Status New    Target Date 01/14/20      PT LONG TERM GOAL #2   Title perform Rt side thoracolumbar sidebending without increase in pain for  improved  function    Time 6    Period Weeks    Status New    Target Date 01/14/20      PT LONG TERM GOAL #3   Title report decreased frequency of pain episodes for improved function    Time 6    Period Weeks    Status New    Target Date 01/14/20      PT LONG TERM GOAL #4   Title Pt will reduce overall pain levels for neck and back to less than 3/10 on avg with ususal activity.    Time 6    Period Weeks    Status New    Target Date 01/14/20      PT LONG TERM GOAL #5   Title n/a    Time 6    Period Weeks    Status New    Target Date 01/14/20                  Plan - 12/03/19 1156    Clinical Impression Statement Pt is a 69 y/o male who returns to OPPT for continued Rt sided thoracic pain with active trigger points noted in mid back.  Pt with positive response to DN today and hopeful for improved pain.  Will benefit from PT to address deficits listed.    Personal Factors and Comorbidities Comorbidity 2;Past/Current Experience    Comorbidities PMH: lumbar DDD, previous neck fusion, sleep apnea, HTN, bilat TKA    Examination-Activity Limitations Bend;Squat;Stand;Lift    Examination-Participation Restrictions Community Activity;Yard Work    Merchant navy officer Evolving/Moderate complexity    Clinical Decision Making Moderate    Rehab Potential Good    PT Frequency 2x / week   1-2x/wk   PT Duration 6 weeks    PT Treatment/Interventions ADLs/Self Care Home Management;Aquatic Therapy;Cryotherapy;Electrical Stimulation;Moist Heat;Ultrasound;Therapeutic activities;Therapeutic exercise;Neuromuscular re-education;Manual techniques;Passive range of motion;Dry needling;Taping    PT Next Visit Plan assess response to DN, continue PRN    PT Home Exercise Plan Access Code: [email or web address]    Consulted and Agree with Plan of Care Patient           Patient will benefit from skilled therapeutic intervention in order to improve the following deficits and impairments:   Decreased activity tolerance, Decreased endurance, Decreased strength, Hypomobility, Increased muscle spasms, Impaired flexibility, Pain, Increased fascial restricitons  Visit Diagnosis: Cervicalgia - Plan: PT plan of care cert/re-cert  Pain in thoracic spine - Plan: PT plan of care cert/re-cert  Other symptoms and signs involving the musculoskeletal system - Plan: PT plan of care cert/re-cert     Problem List Patient Active Problem List   Diagnosis Date Noted  . Impingement syndrome of right shoulder 10/10/2019  . Low back pain 10/10/2019  . Irritation of ulnar nerve, left 08/28/2019  . Class 2 obesity due to excess calories without serious comorbidity with body mass index (BMI) of 39.0 to 39.9 in adult 01/08/2019  . Pain in right elbow 12/06/2018  . Primary osteoarthritis of left knee 07/25/2018  . History of left knee replacement 07/25/2018  . Right foot pain 06/22/2018  . Acute left-sided low back pain with left-sided sciatica 06/22/2018  . Osteoarthritis 11/21/2017  . Hyperlipidemia 11/21/2017  . Low testosterone 09/26/2016  . Healthcare maintenance 09/26/2016  . S/P total knee replacement using cement, right 06/01/2016  . Sleep apnea 05/19/2016  . Hypertension 05/19/2016      Laureen Abrahams, PT, DPT 12/03/19 12:03 PM    [facility]  [address] [place], [place], [address] Phone: [phone]   Fax:  [phone]  Name: James Gallegos MRN: [id number] Date of Birth: January 12, 1951

## 2019-12-03 NOTE — Patient Instructions (Signed)

## 2019-12-11 ENCOUNTER — Encounter: Payer: 59 | Admitting: Physical Therapy

## 2019-12-13 ENCOUNTER — Ambulatory Visit (INDEPENDENT_AMBULATORY_CARE_PROVIDER_SITE_OTHER): Payer: 59 | Admitting: Physical Therapy

## 2019-12-13 ENCOUNTER — Encounter: Payer: Self-pay | Admitting: Physical Therapy

## 2019-12-13 ENCOUNTER — Other Ambulatory Visit: Payer: Self-pay

## 2019-12-13 DIAGNOSIS — M546 Pain in thoracic spine: Secondary | ICD-10-CM

## 2019-12-13 DIAGNOSIS — M542 Cervicalgia: Secondary | ICD-10-CM | POA: Diagnosis not present

## 2019-12-13 DIAGNOSIS — R29898 Other symptoms and signs involving the musculoskeletal system: Secondary | ICD-10-CM | POA: Diagnosis not present

## 2019-12-13 MED FILL — AMOXICILLIN 500 MG CAPSULE: 500 | 3 days supply | Qty: 12 | Fill #0

## 2019-12-13 NOTE — Therapy (Signed)
[facility] Physical Therapy [address] [place], [place], [address] Phone: [phone]   Fax:  [phone]  Physical Therapy Treatment  Patient Details  Name: James Gallegos MRN: [id number] Date of Birth: May 04, 1951 Referring Provider (PT): Dr. Eunice Blase   Encounter Date: 12/13/2019   PT End of Session - 12/13/19 1422    Visit Number 2    Number of Visits 12    Date for PT Re-Evaluation 01/14/20    Authorization Type [facility]    PT Start Time 1700    PT Stop Time 1420    PT Time Calculation (min) 35 min    Activity Tolerance Patient tolerated treatment well;No increased pain    Behavior During Therapy WFL for tasks assessed/performed           Past Medical History:  Diagnosis Date  . Arthritis    back & L knee, R foot - lis franc   . Deep vein thrombosis ([place])    after knee surgery in 2004  . Hypertension   . Murmur, cardiac    pt. reports its difficult to auscultate   . Sleep apnea    CPAP- last study- 2014    Past Surgical History:  Procedure Laterality Date  . CERVICAL SPINE SURGERY N/A 1990   C5-6 discectomy  . FINGER SURGERY Right 2008   x 3 index finger  . KNEE ARTHROPLASTY    . KNEE ARTHROSCOPY Bilateral    L 2004, R 2005  . MASS EXCISION Right 01/18/2017   Procedure: EXCISION BENIGN RIGHT NECK MASS;  Surgeon: Rozetta Nunnery, MD;  Location: [place];  Service: ENT;  Laterality: Right;  . MASS EXCISION Bilateral 03/23/2019   Procedure: EXCISIONS OF RIGHT CHEEK LESION, RIGHT POSTERIOR NECK LIPOMA  AND LEFT NECK NODULE;  Surgeon: Rozetta Nunnery, MD;  Location: [place];  Service: ENT;  Laterality: Bilateral;  . NASAL FRACTURE SURGERY    . 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 TOTAL KNEE ARTHROPLASTY Right 06/01/2016   Procedure: TOTAL KNEE ARTHROPLASTY;  Surgeon: [name], MD;  Location: [place];  Service: Orthopedics;  Laterality: Right;  . TOTAL KNEE ARTHROPLASTY Left  07/25/2018   Procedure: LEFT TOTAL KNEE ARTHROPLASTY;  Surgeon: [name], MD;  Location: [place];  Service: Orthopedics;  Laterality: Left;    There were no vitals filed for this visit.   Subjective Assessment - 12/13/19 1350    Subjective feels about the same    Pertinent History PMH: lumbar DDD, previous neck fusion, sleep apnea, HTN, bilat TKA    Limitations Standing;Walking    How long can you stand comfortably? depends but sore and painful if he works all day    Diagnostic tests MRI scan demonstrates degenerative disc disease and degenerative facet disease throughout the lower thoracic and lumbar regions    Patient Stated Goals improve pain in mid back    Currently in Pain? Yes    Pain Score 1    up to 10+   Pain Onset More than a month ago                             [facility] Adult PT Treatment/Exercise - 12/13/19 1350      Neck Exercises: Machines for Strengthening   UBE (Upper Arm Bike) L4 x 6 min, 3 min each direction      Manual Therapy   Manual Therapy Soft tissue  mobilization    Manual therapy comments skilled palpation and monitoring of soft tissue during DN    Soft tissue mobilization Rt rhomboids and thoracic paraspinals            Trigger Point Dry Needling - 12/13/19 0001    Consent Given? Yes    Education Handout Provided Previously provided    Muscles Treated Upper Quadrant Rhomboids    Muscles Treated Back/Hip Thoracic multifidi    Electrical Stimulation Performed with Dry Needling Yes    E-stim with Dry Needling Details to rhomboids to tolerance x 5 min    Rhomboids Response Twitch response elicited;Palpable increased muscle length    Thoracic multifidi response Twitch response elicited                     PT Long Term Goals - 12/03/19 1158      PT LONG TERM GOAL #1   Title independent with HEP    Time 6    Period Weeks    Status New    Target Date 01/14/20      PT LONG TERM GOAL #2   Title perform Rt side  thoracolumbar sidebending without increase in pain for improved function    Time 6    Period Weeks    Status New    Target Date 01/14/20      PT LONG TERM GOAL #3   Title report decreased frequency of pain episodes for improved function    Time 6    Period Weeks    Status New    Target Date 01/14/20      PT LONG TERM GOAL #4   Title Pt will reduce overall pain levels for neck and back to less than 3/10 on avg with ususal activity.    Time 6    Period Weeks    Status New    Target Date 01/14/20      PT LONG TERM GOAL #5   Title n/a    Time 6    Period Weeks    Status New    Target Date 01/14/20                 Plan - 12/13/19 1422    Clinical Impression Statement Pt with positive response to DN with estim today with reduction in pain following session.  No goals met as only 2nd visit.    Personal Factors and Comorbidities Comorbidity 2;Past/Current Experience    Comorbidities PMH: lumbar DDD, previous neck fusion, sleep apnea, HTN, bilat TKA    Examination-Activity Limitations Bend;Squat;Stand;Lift    Examination-Participation Restrictions Community Activity;Yard Work    [employer]chant [employer] officer Evolving/Moderate complexity    Rehab Potential Good    PT Frequency 2x / week   1-2x/wk   PT Duration 6 weeks    PT Treatment/Interventions ADLs/Self Care Home Management;Aquatic Therapy;Cryotherapy;Electrical Stimulation;Moist Heat;Ultrasound;Therapeutic activities;Therapeutic exercise;Neuromuscular re-education;Manual techniques;Passive range of motion;Dry needling;Taping    PT Next Visit Plan assess response to DN with estim,  continue PRN    PT Home Exercise Plan Access Code: [email or web address]    Consulted and Agree with Plan of Care Patient           Patient will benefit from skilled therapeutic intervention in order to improve the following deficits and impairments:  Decreased activity tolerance, Decreased endurance, Decreased strength, Hypomobility, Increased  muscle spasms, Impaired flexibility, Pain, Increased fascial restricitons  Visit Diagnosis: Cervicalgia  Pain in thoracic spine  Other symptoms and signs involving the musculoskeletal  system     Problem List Patient Active Problem List   Diagnosis Date Noted  . Impingement syndrome of right shoulder 10/10/2019  . Low back pain 10/10/2019  . Irritation of ulnar nerve, left 08/28/2019  . Class 2 obesity due to excess calories without serious comorbidity with body mass index (BMI) of 39.0 to 39.9 in adult 01/08/2019  . Pain in right elbow 12/06/2018  . Primary osteoarthritis of left knee 07/25/2018  . History of left knee replacement 07/25/2018  . Right foot pain 06/22/2018  . Acute left-sided low back pain with left-sided sciatica 06/22/2018  . Osteoarthritis 11/21/2017  . Hyperlipidemia 11/21/2017  . Low testosterone 09/26/2016  . Healthcare maintenance 09/26/2016  . S/P total knee replacement using cement, right 06/01/2016  . Sleep apnea 05/19/2016  . Hypertension 05/19/2016      Laureen Abrahams, PT, DPT 12/13/19 2:24 PM    [facility] Physical Therapy [address] [place], [place], [address] Phone: [phone]   Fax:  [phone]  Name: FREDI GEILER MRN: [id number] Date of Birth: 02-27-1951

## 2019-12-14 ENCOUNTER — Encounter: Payer: 59 | Admitting: Physical Therapy

## 2019-12-19 ENCOUNTER — Ambulatory Visit (INDEPENDENT_AMBULATORY_CARE_PROVIDER_SITE_OTHER): Payer: 59 | Admitting: Physical Therapy

## 2019-12-19 ENCOUNTER — Encounter: Payer: Self-pay | Admitting: Physical Therapy

## 2019-12-19 ENCOUNTER — Other Ambulatory Visit: Payer: Self-pay

## 2019-12-19 DIAGNOSIS — M542 Cervicalgia: Secondary | ICD-10-CM

## 2019-12-19 DIAGNOSIS — R29898 Other symptoms and signs involving the musculoskeletal system: Secondary | ICD-10-CM | POA: Diagnosis not present

## 2019-12-19 DIAGNOSIS — M546 Pain in thoracic spine: Secondary | ICD-10-CM

## 2019-12-19 NOTE — Therapy (Addendum)
[facility] Physical Therapy [address] [place], [place], [address] Phone: [phone]   Fax:  [phone]  Physical Therapy Treatment  Patient Details  Name: James Gallegos MRN: [id number] Date of Birth: 1951/02/15 Referring Provider (PT): Dr. Eunice Blase   Encounter Date: 12/19/2019   PT End of Session - 12/19/19 0847    Visit Number 3    Number of Visits 12    Date for PT Re-Evaluation 01/14/20    Authorization Type [facility]    PT Start Time 0800    PT Stop Time 5188    PT Time Calculation (min) 35 min    Activity Tolerance Patient tolerated treatment well;No increased pain    Behavior During Therapy WFL for tasks assessed/performed           Past Medical History:  Diagnosis Date  . Arthritis    back & L knee, R foot - lis franc   . Deep vein thrombosis ([place])    after knee surgery in 2004  . Hypertension   . Murmur, cardiac    pt. reports its difficult to auscultate   . Sleep apnea    CPAP- last study- 2014    Past Surgical History:  Procedure Laterality Date  . CERVICAL SPINE SURGERY N/A 1990   C5-6 discectomy  . FINGER SURGERY Right 2008   x 3 index finger  . KNEE ARTHROPLASTY    . KNEE ARTHROSCOPY Bilateral    L 2004, R 2005  . MASS EXCISION Right 01/18/2017   Procedure: EXCISION BENIGN RIGHT NECK MASS;  Surgeon: Rozetta Nunnery, MD;  Location: [place];  Service: ENT;  Laterality: Right;  . MASS EXCISION Bilateral 03/23/2019   Procedure: EXCISIONS OF RIGHT CHEEK LESION, RIGHT POSTERIOR NECK LIPOMA  AND LEFT NECK NODULE;  Surgeon: Rozetta Nunnery, MD;  Location: [place];  Service: ENT;  Laterality: Bilateral;  . NASAL FRACTURE SURGERY    . 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 TOTAL KNEE ARTHROPLASTY Right 06/01/2016   Procedure: TOTAL KNEE ARTHROPLASTY;  Surgeon: [name], MD;  Location: [place];  Service: Orthopedics;  Laterality: Right;  . TOTAL KNEE ARTHROPLASTY Left  07/25/2018   Procedure: LEFT TOTAL KNEE ARTHROPLASTY;  Surgeon: [name], MD;  Location: [place];  Service: Orthopedics;  Laterality: Left;    There were no vitals filed for this visit.   Subjective Assessment - 12/19/19 0804    Subjective "I want you to do what you did last time because it's almost gone." hasn't used heating pad and pain overall greatly decreased.    Pertinent History PMH: lumbar DDD, previous neck fusion, sleep apnea, HTN, bilat TKA    Limitations Standing;Walking    How long can you stand comfortably? depends but sore and painful if he works all day    Diagnostic tests MRI scan demonstrates degenerative disc disease and degenerative facet disease throughout the lower thoracic and lumbar regions    Patient Stated Goals improve pain in mid back    Currently in Pain? Yes    Pain Score 1     Pain Location Back    Pain Orientation Right;Mid    Pain Descriptors / Indicators Aching    Pain Type Chronic pain    Pain Onset More than a month ago    Pain Frequency Intermittent    Aggravating Factors  sneezing    Pain Relieving Factors rest, heat  [facility] Adult PT Treatment/Exercise - 12/19/19 0805      Neck Exercises: Machines for Strengthening   UBE (Upper Arm Bike) L4 x 6 min, 3 min each direction      Manual Therapy   Manual Therapy Soft tissue mobilization    Manual therapy comments skilled palpation and monitoring of soft tissue during DN    Soft tissue mobilization Rt rhomboids and thoracic paraspinals            Trigger Point Dry Needling - 12/19/19 0846    Consent Given? Yes    Education Handout Provided Previously provided    Muscles Treated Upper Quadrant Rhomboids    Electrical Stimulation Performed with Dry Needling Yes    E-stim with Dry Needling Details to rhomboids to tolerance x 5 min    Rhomboids Response Twitch response elicited;Palpable increased muscle length                      PT Long Term Goals - 12/03/19 1158      PT LONG TERM GOAL #1   Title independent with HEP    Time 6    Period Weeks    Status New    Target Date 01/14/20      PT LONG TERM GOAL #2   Title perform Rt side thoracolumbar sidebending without increase in pain for improved function    Time 6    Period Weeks    Status New    Target Date 01/14/20      PT LONG TERM GOAL #3   Title report decreased frequency of pain episodes for improved function    Time 6    Period Weeks    Status New    Target Date 01/14/20      PT LONG TERM GOAL #4   Title Pt will reduce overall pain levels for neck and back to less than 3/10 on avg with ususal activity.    Time 6    Period Weeks    Status New    Target Date 01/14/20      PT LONG TERM GOAL #5   Title n/a    Time 6    Period Weeks    Status New    Target Date 01/14/20                 Plan - 12/19/19 0847    Clinical Impression Statement Pt reports significant improvement following previous session and feels pain is "nearly resolved."  Repeated session today with estim and DN/manual therapy.  May not need additional visits for this, but has active trigger points in Lt cervical paraspinals and may benefit from trial of DN to this area as well.  Will continue to monitor for improvement.    Personal Factors and Comorbidities Comorbidity 2;Past/Current Experience    Comorbidities PMH: lumbar DDD, previous neck fusion, sleep apnea, HTN, bilat TKA    Examination-Activity Limitations Bend;Squat;Stand;Lift    Examination-Participation Restrictions Community Activity;Yard Work    [employer]chant [employer] officer Evolving/Moderate complexity    Rehab Potential Good    PT Frequency 2x / week   1-2x/wk   PT Duration 6 weeks    PT Treatment/Interventions ADLs/Self Care Home Management;Aquatic Therapy;Cryotherapy;Electrical Stimulation;Moist Heat;Ultrasound;Therapeutic activities;Therapeutic exercise;Neuromuscular re-education;Manual  techniques;Passive range of motion;Dry needling;Taping    PT Next Visit Plan assess response to DN with estim,  continue PRN, DN to cervical paraspinals    PT Home Exercise Plan Access Code: [email or web address]    Consulted and Agree  with Plan of Care Patient           Patient will benefit from skilled therapeutic intervention in order to improve the following deficits and impairments:  Decreased activity tolerance, Decreased endurance, Decreased strength, Hypomobility, Increased muscle spasms, Impaired flexibility, Pain, Increased fascial restricitons  Visit Diagnosis: Cervicalgia  Pain in thoracic spine  Other symptoms and signs involving the musculoskeletal system     Problem List Patient Active Problem List   Diagnosis Date Noted  . Impingement syndrome of right shoulder 10/10/2019  . Low back pain 10/10/2019  . Irritation of ulnar nerve, left 08/28/2019  . Class 2 obesity due to excess calories without serious comorbidity with body mass index (BMI) of 39.0 to 39.9 in adult 01/08/2019  . Pain in right elbow 12/06/2018  . Primary osteoarthritis of left knee 07/25/2018  . History of left knee replacement 07/25/2018  . Right foot pain 06/22/2018  . Acute left-sided low back pain with left-sided sciatica 06/22/2018  . Osteoarthritis 11/21/2017  . Hyperlipidemia 11/21/2017  . Low testosterone 09/26/2016  . Healthcare maintenance 09/26/2016  . S/P total knee replacement using cement, right 06/01/2016  . Sleep apnea 05/19/2016  . Hypertension 05/19/2016      Laureen Abrahams, PT, DPT 12/19/19 8:50 AM     [facility] Physical Therapy [address] [place], [place], [address] Phone: [phone]   Fax:  [phone]  Name: ABISHAI VIEGAS MRN: [id number] Date of Birth: [birth year]

## 2019-12-20 ENCOUNTER — Encounter: Payer: Self-pay | Admitting: Family Medicine

## 2019-12-20 DIAGNOSIS — M25512 Pain in left shoulder: Secondary | ICD-10-CM

## 2019-12-20 DIAGNOSIS — G8929 Other chronic pain: Secondary | ICD-10-CM

## 2019-12-20 DIAGNOSIS — M542 Cervicalgia: Secondary | ICD-10-CM

## 2019-12-20 DIAGNOSIS — M546 Pain in thoracic spine: Secondary | ICD-10-CM

## 2019-12-24 ENCOUNTER — Other Ambulatory Visit: Payer: Self-pay

## 2019-12-24 ENCOUNTER — Encounter: Payer: Self-pay | Admitting: Physical Therapy

## 2019-12-24 ENCOUNTER — Ambulatory Visit (INDEPENDENT_AMBULATORY_CARE_PROVIDER_SITE_OTHER): Payer: 59 | Admitting: Physical Therapy

## 2019-12-24 DIAGNOSIS — M546 Pain in thoracic spine: Secondary | ICD-10-CM | POA: Diagnosis not present

## 2019-12-24 DIAGNOSIS — R29898 Other symptoms and signs involving the musculoskeletal system: Secondary | ICD-10-CM | POA: Diagnosis not present

## 2019-12-24 DIAGNOSIS — M542 Cervicalgia: Secondary | ICD-10-CM | POA: Diagnosis not present

## 2019-12-24 NOTE — Patient Instructions (Signed)
Access Code: [email or web address] URL: [email or web address] Date: 12/24/2019 Prepared by: Faustino Congress  Exercises Seated Assisted Cervical Rotation with Towel - 1-2 x daily - 7 x weekly - 1 sets - 3 reps - 10-15 sec hold Seated Upper Trapezius Stretch - 2 x daily - 7 x weekly - 3 reps - 1 sets - 30 sec hold Seated Cervical Retraction - 2 x daily - 7 x weekly - 10 reps - 1 sets - 5 sec hold

## 2019-12-24 NOTE — Therapy (Signed)
[facility] Physical Therapy [address] [place], [place], [address] Phone: [phone]   Fax:  [phone]  Physical Therapy Treatment/Recertification  Patient Details  Name: James Gallegos MRN: [id number] Date of Birth: 1950/10/17 Referring Provider (PT): Dr. Eunice Blase   Encounter Date: 12/24/2019   PT End of Session - 12/24/19 1340    Visit Number 4    Number of Visits 12    Date for PT Re-Evaluation 01/14/20    Authorization Type [facility]    PT Start Time 1300    PT Stop Time 5885    PT Time Calculation (min) 38 min    Activity Tolerance Patient tolerated treatment well;No increased pain    Behavior During Therapy WFL for tasks assessed/performed           Past Medical History:  Diagnosis Date  . Arthritis    back & L knee, R foot - lis franc   . Deep vein thrombosis ([place])    after knee surgery in 2004  . Hypertension   . Murmur, cardiac    pt. reports its difficult to auscultate   . Sleep apnea    CPAP- last study- 2014    Past Surgical History:  Procedure Laterality Date  . CERVICAL SPINE SURGERY N/A 1990   C5-6 discectomy  . FINGER SURGERY Right 2008   x 3 index finger  . KNEE ARTHROPLASTY    . KNEE ARTHROSCOPY Bilateral    L 2004, R 2005  . MASS EXCISION Right 01/18/2017   Procedure: EXCISION BENIGN RIGHT NECK MASS;  Surgeon: Rozetta Nunnery, MD;  Location: [place];  Service: ENT;  Laterality: Right;  . MASS EXCISION Bilateral 03/23/2019   Procedure: EXCISIONS OF RIGHT CHEEK LESION, RIGHT POSTERIOR NECK LIPOMA  AND LEFT NECK NODULE;  Surgeon: Rozetta Nunnery, MD;  Location: [place];  Service: ENT;  Laterality: Bilateral;  . NASAL FRACTURE SURGERY    . 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 TOTAL KNEE ARTHROPLASTY Right 06/01/2016   Procedure: TOTAL KNEE ARTHROPLASTY;  Surgeon: [name], MD;  Location: [place];  Service: Orthopedics;  Laterality: Right;  . TOTAL KNEE  ARTHROPLASTY Left 07/25/2018   Procedure: LEFT TOTAL KNEE ARTHROPLASTY;  Surgeon: [name], MD;  Location: [place];  Service: Orthopedics;  Laterality: Left;    There were no vitals filed for this visit.   Subjective Assessment - 12/24/19 1302    Subjective back is doing pretty well, still has an occasional twinge but "it's 1000x better than it was."    Pertinent History PMH: lumbar DDD, previous neck fusion, sleep apnea, HTN, bilat TKA    Limitations Standing;Walking    How long can you stand comfortably? depends but sore and painful if he works all day    Diagnostic tests MRI scan demonstrates degenerative disc disease and degenerative facet disease throughout the lower thoracic and lumbar regions    Patient Stated Goals improve pain in mid back    Currently in Pain? Yes    Pain Onset More than a month ago    Multiple Pain Sites Yes    Pain Score 2   up to 4/10   Pain Location Neck    Pain Orientation Left    Pain Descriptors / Indicators Sharp;Shooting;Aching;Sore    Pain Type Chronic pain    Pain Onset More than a month ago    Pain Frequency Intermittent    Aggravating Factors  turning to Lt  Pain Relieving Factors nothing, has tried a TENS              [facility] PT Assessment - 12/24/19 1310      Assessment   Medical Diagnosis neck pain, pain in thoracic spine    Referring Provider (PT) Dr. Legrand Como [place]      AROM   AROM Assessment Site Cervical    Cervical Flexion 27    Cervical Extension 20    Cervical - Right Side Bend 30    Cervical - Left Side Bend 23   with pain   Cervical - Right Rotation 55    Cervical - Left Rotation 41   with pain     Palpation   Palpation comment active trigger points in Lt upper trap noted                         [facility] Adult PT Treatment/Exercise - 12/24/19 1304      Exercises   Exercises Neck      Neck Exercises: Machines for Strengthening   UBE (Upper Arm Bike) L4 x 6 min, 3 min each direction      Neck  Exercises: Seated   Neck Retraction 5 secs   3 reps     Manual Therapy   Manual Therapy Soft tissue mobilization    Manual therapy comments skilled palpation and monitoring of soft tissue during DN    Soft tissue mobilization Lt upper trap and cervical paraspinals      Neck Exercises: Stretches   Upper Trapezius Stretch Left;1 rep;20 seconds    Other Neck Stretches towel rotation stretch 2x20 sec            Trigger Point Dry Needling - 12/24/19 1342    Consent Given? Yes    Education Handout Provided Previously provided    Muscles Treated Head and Neck Upper trapezius    Upper Trapezius Response Twitch reponse elicited;Palpable increased muscle length                PT Education - 12/24/19 1340    Education Details HEP    Person(s) Educated Patient    Methods Explanation;Demonstration;Handout    Comprehension Verbalized understanding;Returned demonstration;Need further instruction               PT Long Term Goals - 12/24/19 1343      PT LONG TERM GOAL #1   Title independent with HEP    Time 6    Period Weeks    Status On-going    Target Date 01/14/20      PT LONG TERM GOAL #2   Title perform Rt side thoracolumbar sidebending without increase in pain for improved function    Time 6    Period Weeks    Status On-going    Target Date 01/14/20      PT LONG TERM GOAL #3   Title report decreased frequency of pain episodes for improved function    Time 6    Period Weeks    Status Achieved      PT LONG TERM GOAL #4   Title Pt will reduce overall pain levels for neck and back to less than 3/10 on avg with ususal activity.    Time 6    Period Weeks    Status Achieved      PT LONG TERM GOAL #5   Title improve Lt cervical rotation to at least 55 deg with pain < 3/10 for  improved function and mobility    Time 6    Period Weeks    Status New    Target Date 01/14/20                 Plan - 12/24/19 1344    Clinical Impression Statement Overall pt  has met 2 LTGs related to thoracic pain.  Has increased Lt sided neck pain and new referral to treat today.  He has decreased ROM and active trigger points and will benefit from PT to address deficits listed.    Personal Factors and Comorbidities Comorbidity 2;Past/Current Experience    Comorbidities PMH: lumbar DDD, previous neck fusion, sleep apnea, HTN, bilat TKA    Examination-Activity Limitations Bend;Squat;Stand;Lift    Examination-Participation Restrictions Community Activity;Yard Work    [employer]chant [employer] officer Evolving/Moderate complexity    Rehab Potential Good    PT Frequency 2x / week   1-2x/wk   PT Duration 6 weeks    PT Treatment/Interventions ADLs/Self Care Home Management;Aquatic Therapy;Cryotherapy;Electrical Stimulation;Moist Heat;Ultrasound;Therapeutic activities;Therapeutic exercise;Neuromuscular re-education;Manual techniques;Passive range of motion;Dry needling;Taping;Patient/family education;Traction    PT Next Visit Plan assess response to DN of upper trap, review HEP, continue as needed    PT Home Exercise Plan Access Code: [email or web address]    Consulted and Agree with Plan of Care Patient           Patient will benefit from skilled therapeutic intervention in order to improve the following deficits and impairments:  Decreased activity tolerance, Decreased endurance, Decreased strength, Hypomobility, Increased muscle spasms, Impaired flexibility, Pain, Increased fascial restricitons, Decreased range of motion  Visit Diagnosis: Cervicalgia - Plan: PT plan of care cert/re-cert  Pain in thoracic spine - Plan: PT plan of care cert/re-cert  Other symptoms and signs involving the musculoskeletal system - Plan: PT plan of care cert/re-cert     Problem List Patient Active Problem List   Diagnosis Date Noted  . Impingement syndrome of right shoulder 10/10/2019  . Low back pain 10/10/2019  . Irritation of ulnar nerve, left 08/28/2019  . Class 2 obesity due to  excess calories without serious comorbidity with body mass index (BMI) of 39.0 to 39.9 in adult 01/08/2019  . Pain in right elbow 12/06/2018  . Primary osteoarthritis of left knee 07/25/2018  . History of left knee replacement 07/25/2018  . Right foot pain 06/22/2018  . Acute left-sided low back pain with left-sided sciatica 06/22/2018  . Osteoarthritis 11/21/2017  . Hyperlipidemia 11/21/2017  . Low testosterone 09/26/2016  . Healthcare maintenance 09/26/2016  . S/P total knee replacement using cement, right 06/01/2016  . Sleep apnea 05/19/2016  . Hypertension 05/19/2016       Laureen Abrahams, PT, DPT 12/24/19 1:47 PM     [facility] [address] [place], [place], [address] Phone: [phone]   Fax:  [phone]  Name: James Gallegos MRN: [id number] Date of Birth: 04-10-1951

## 2019-12-27 IMAGING — CR DG CHEST 2V
2 series · 2 of 2 positions shown · non-contrast
Comparison: 05/21/2016

CLINICAL DATA: Preop knee replacement

EXAM:
CHEST - 2 VIEW

[w chest pa]
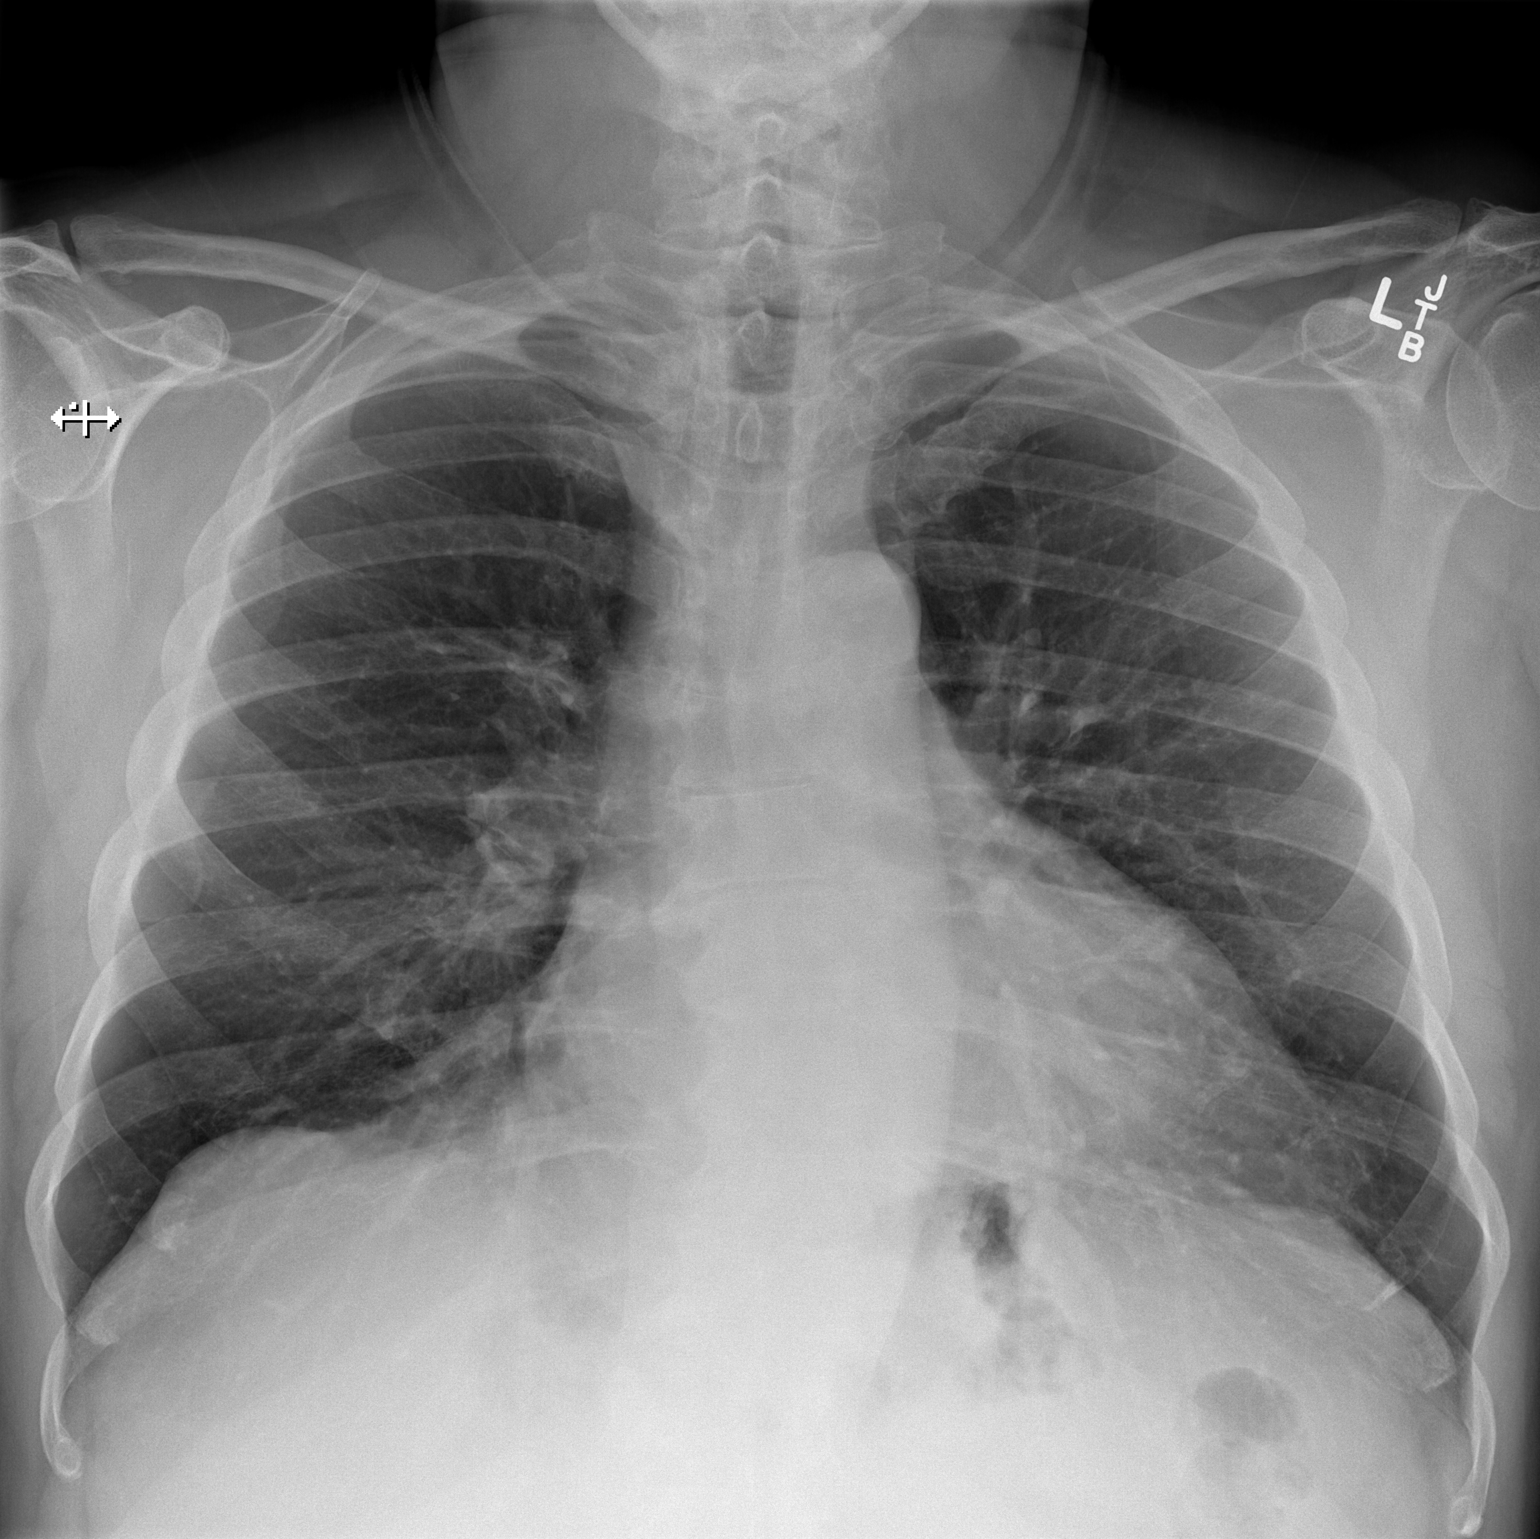

[w chest lat]
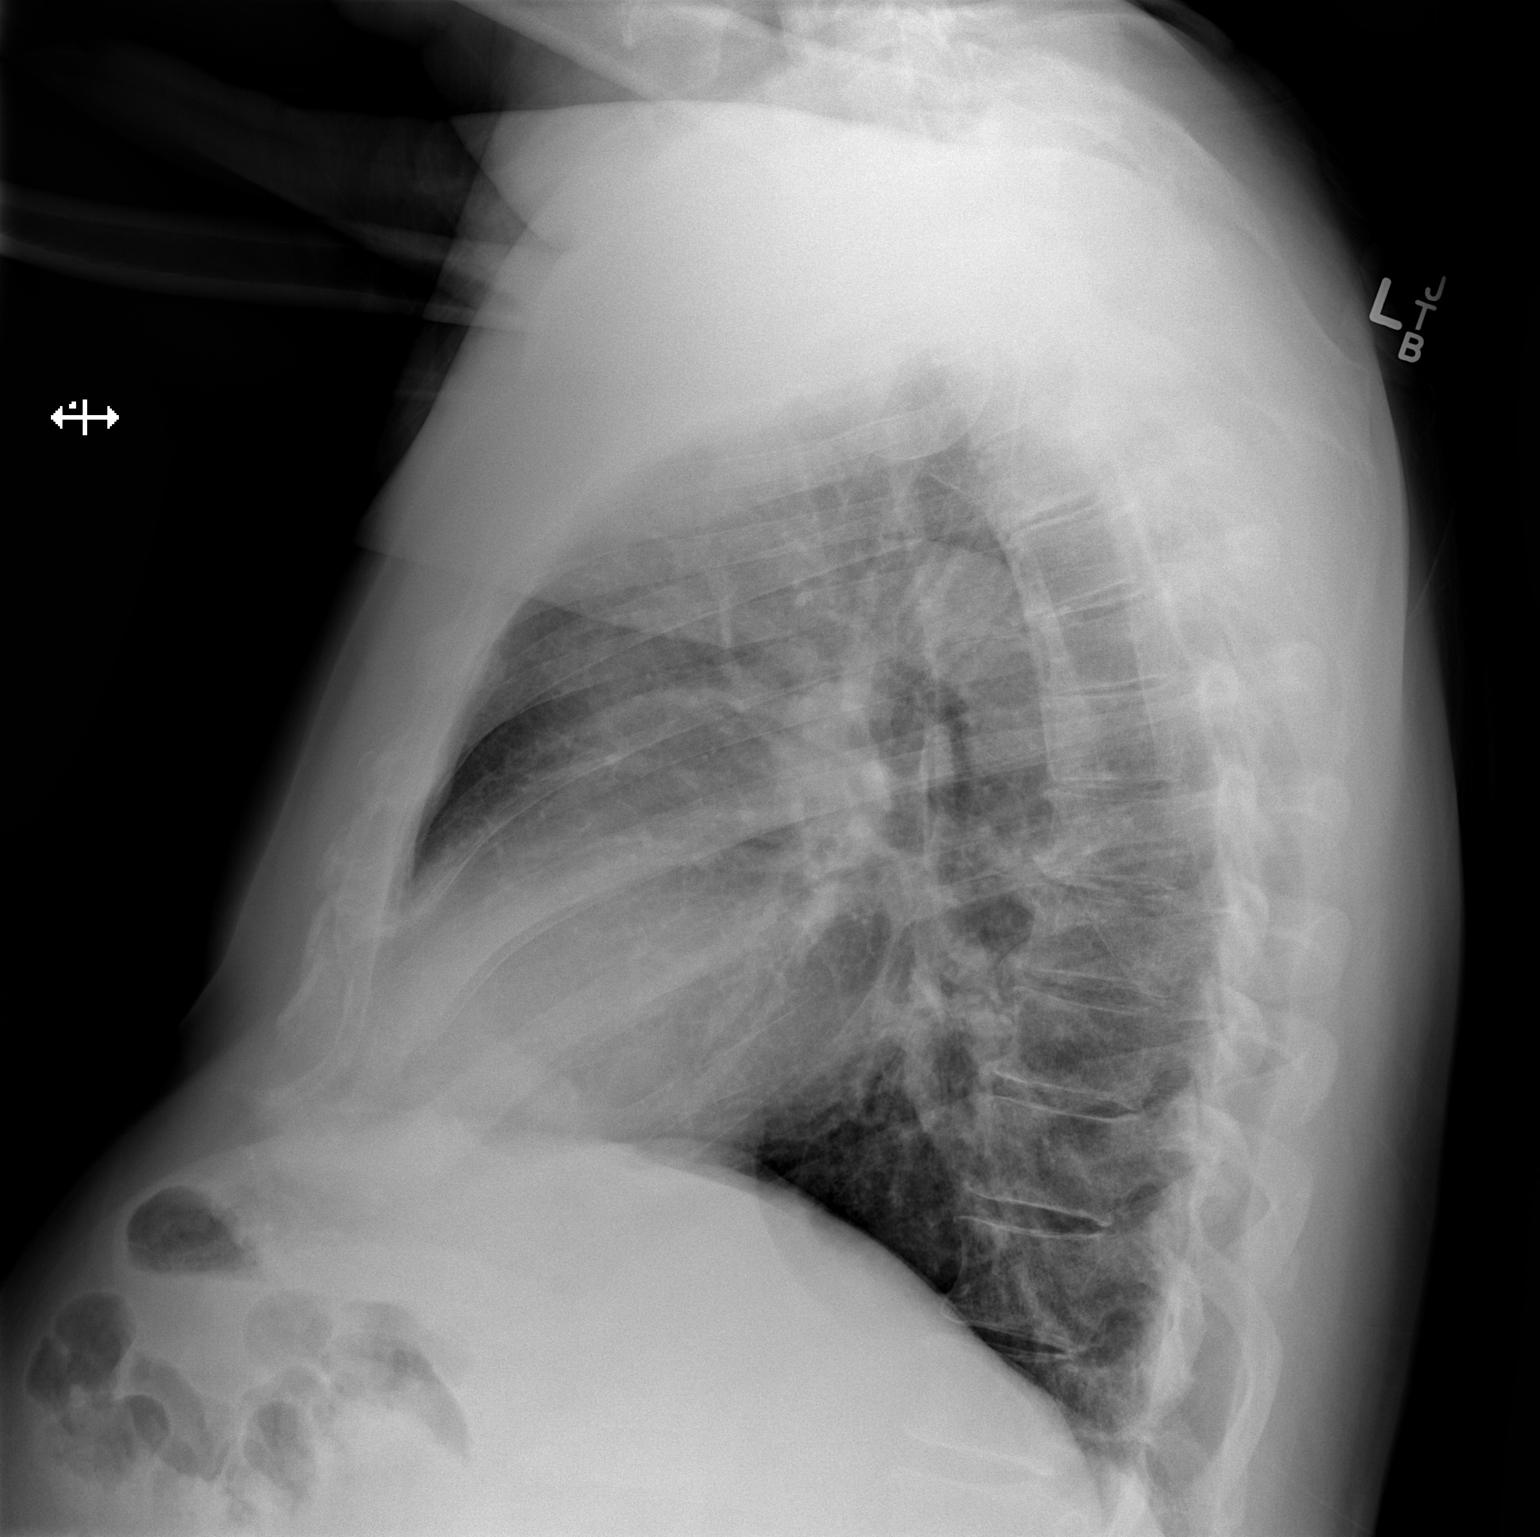

[2 of 2 positions shown; findings below may reference images not displayed]

FINDINGS: Heart is mildly enlarged. Lungs clear. No effusions. No acute bony
abnormality.
IMPRESSION: Mild cardiomegaly.  No active disease.

## 2019-12-28 DIAGNOSIS — G4733 Obstructive sleep apnea (adult) (pediatric): Secondary | ICD-10-CM | POA: Diagnosis not present

## 2020-01-03 MED FILL — LOSARTAN-HCTZ 100-25 MG TAB: 100-25 | 90 days supply | Qty: 90 | Fill #1

## 2020-01-09 ENCOUNTER — Other Ambulatory Visit: Payer: Self-pay

## 2020-01-09 ENCOUNTER — Encounter: Payer: Self-pay | Admitting: Physical Therapy

## 2020-01-09 ENCOUNTER — Ambulatory Visit (INDEPENDENT_AMBULATORY_CARE_PROVIDER_SITE_OTHER): Payer: 59 | Admitting: Physical Therapy

## 2020-01-09 DIAGNOSIS — M546 Pain in thoracic spine: Secondary | ICD-10-CM

## 2020-01-09 DIAGNOSIS — M542 Cervicalgia: Secondary | ICD-10-CM

## 2020-01-09 DIAGNOSIS — R29898 Other symptoms and signs involving the musculoskeletal system: Secondary | ICD-10-CM | POA: Diagnosis not present

## 2020-01-09 NOTE — Therapy (Signed)
[facility] Physical Therapy [address] [place], [place], [address] Phone: [phone]   Fax:  [phone]  Physical Therapy Treatment  Patient Details  Name: James Gallegos MRN: [id number] Date of Birth: [birth year] Referring Provider (PT): Dr. Eunice Blase   Encounter Date: 01/09/2020   PT End of Session - 01/09/20 1009    Visit Number 5    Number of Visits 12    Date for PT Re-Evaluation 01/14/20    Authorization Type [facility]    PT Start Time 0933    PT Stop Time 1005    PT Time Calculation (min) 32 min    Activity Tolerance Patient tolerated treatment well;No increased pain    Behavior During Therapy WFL for tasks assessed/performed           Past Medical History:  Diagnosis Date   Arthritis    back & L knee, R foot - lis franc    Deep vein thrombosis ([place])    after knee surgery in 2004   Hypertension    Murmur, cardiac    pt. reports its difficult to auscultate    Sleep apnea    CPAP- last study- 2014    Past Surgical History:  Procedure Laterality Date   CERVICAL SPINE SURGERY N/A 1990   C5-6 discectomy   FINGER SURGERY Right 2008   x 3 index finger   KNEE ARTHROPLASTY     KNEE ARTHROSCOPY Bilateral    L 2004, R 2005   MASS EXCISION Right 01/18/2017   Procedure: EXCISION BENIGN RIGHT NECK MASS;  Surgeon: Rozetta Nunnery, MD;  Location: [place];  Service: ENT;  Laterality: Right;   MASS EXCISION Bilateral 03/23/2019   Procedure: EXCISIONS OF RIGHT CHEEK LESION, RIGHT POSTERIOR NECK LIPOMA  AND LEFT NECK NODULE;  Surgeon: Rozetta Nunnery, MD;  Location: [place];  Service: ENT;  Laterality: Bilateral;   NASAL FRACTURE SURGERY     TOTAL KNEE ARTHROPLASTY Right 06/01/2016   TOTAL KNEE ARTHROPLASTY Right 06/01/2016   Procedure: TOTAL KNEE ARTHROPLASTY;  Surgeon: [name], MD;  Location: [place];  Service: Orthopedics;  Laterality: Right;   TOTAL KNEE ARTHROPLASTY Left  07/25/2018   Procedure: LEFT TOTAL KNEE ARTHROPLASTY;  Surgeon: [name], MD;  Location: [place];  Service: Orthopedics;  Laterality: Left;    There were no vitals filed for this visit.   Subjective Assessment - 01/09/20 0935    Subjective "back is fine."  neck isn't doing well.  felt the upper trap improved neck motion until about a week ago woke up with stabbing/burning pain into axilla.  looking up increases radicular pain    Pertinent History PMH: lumbar DDD, previous neck fusion, sleep apnea, HTN, bilat TKA    Limitations Standing;Walking    How long can you stand comfortably? depends but sore and painful if he works all day    Diagnostic tests MRI scan demonstrates degenerative disc disease and degenerative facet disease throughout the lower thoracic and lumbar regions    Patient Stated Goals improve pain in mid back    Currently in Pain? Yes    Pain Score 4     Pain Location Neck    Pain Orientation Left    Pain Descriptors / Indicators Burning;Stabbing    Pain Type Acute pain;Chronic pain    Pain Onset 1 to 4 weeks ago    Pain Frequency Intermittent    Aggravating Factors  looking up    Pain  Relieving Factors rest, looking down    Pain Onset More than a month ago              [facility] PT Assessment - 01/09/20 0939      Assessment   Medical Diagnosis neck pain, pain in thoracic spine    Referring Provider (PT) Dr. Eunice Blase      Special Tests    Special Tests Cervical    Cervical Tests Spurling's;Dictraction      Spurling's   Findings Positive    Side Left      Distraction Test   Findngs Positive    Comment slight improvement in symptoms                         [facility] Adult PT Treatment/Exercise - 01/09/20 1006      Self-Care   Self-Care Other Self-Care Comments    Other Self-Care Comments  discussion about current progress and recent symptoms.  recommended he continue to work on exercises to tolerance, and keep neurosurgery appt.   feel loosening of hardware unlikely as recent xrays showed stable ACDF and no recent trauma.        Neck Exercises: Seated   Cervical Isometrics Left lateral flexion;3 secs;1 rep    Neck Retraction 5 secs   3 reps     Manual Therapy   Manual Therapy Soft tissue mobilization    Manual therapy comments skilled palpation and monitoring of soft tissue during DN    Soft tissue mobilization Lt upper trap and cervical paraspinals            Trigger Point Dry Needling - 01/09/20 1007    Consent Given? Yes    Education Handout Provided Previously provided    Muscles Treated Head and Neck Splenius capitus;Semispinalis capitus    Electrical Stimulation Performed with Dry Needling Yes    E-stim with Dry Needling Details Lt cervical paraspinals to tolerance x 5 min    Splenius capitus Response Twitch reponse elicited;Palpable increased muscle length                PT Education - 01/09/20 1009    Education Details see self care    Person(s) Educated Patient    Methods Explanation    Comprehension Verbalized understanding               PT Long Term Goals - 12/24/19 1343      PT LONG TERM GOAL #1   Title independent with HEP    Time 6    Period Weeks    Status On-going    Target Date 01/14/20      PT LONG TERM GOAL #2   Title perform Rt side thoracolumbar sidebending without increase in pain for improved function    Time 6    Period Weeks    Status On-going    Target Date 01/14/20      PT LONG TERM GOAL #3   Title report decreased frequency of pain episodes for improved function    Time 6    Period Weeks    Status Achieved      PT LONG TERM GOAL #4   Title Pt will reduce overall pain levels for neck and back to less than 3/10 on avg with ususal activity.    Time 6    Period Weeks    Status Achieved      PT LONG TERM GOAL #5   Title improve Lt cervical  rotation to at least 55 deg with pain < 3/10 for improved function and mobility    Time 6    Period Weeks      Status New    Target Date 01/14/20                 Plan - 01/09/20 1009    Clinical Impression Statement Pt with reduction in symptoms following session today.  Has some mild indication of disc involvement, and has appt with neurosurgery scheduled 8/10.  Symptoms reproduced with palpation to cervical paraspinals on Lt so DN/manual and DN with estim performed today.  Will continue to benefit from PT to maximize function.    Personal Factors and Comorbidities Comorbidity 2;Past/Current Experience    Comorbidities PMH: lumbar DDD, previous neck fusion, sleep apnea, HTN, bilat TKA    Examination-Activity Limitations Bend;Squat;Stand;Lift    Examination-Participation Restrictions Community Activity;Yard Work    [employer] Evolving/Moderate complexity    Rehab Potential Good    PT Frequency 2x / week   1-2x/wk   PT Duration 6 weeks    PT Treatment/Interventions ADLs/Self Care Home Management;Aquatic Therapy;Cryotherapy;Electrical Stimulation;Moist Heat;Ultrasound;Therapeutic activities;Therapeutic exercise;Neuromuscular re-education;Manual techniques;Passive range of motion;Dry needling;Taping;Patient/family education;Traction    PT Next Visit Plan assess response to DN, continue as needed    PT Home Exercise Plan Access Code: [email or web address]    Consulted and Agree with Plan of Care Patient           Patient will benefit from skilled therapeutic intervention in order to improve the following deficits and impairments:  Decreased activity tolerance, Decreased endurance, Decreased strength, Hypomobility, Increased muscle spasms, Impaired flexibility, Pain, Increased fascial restricitons, Decreased range of motion  Visit Diagnosis: Cervicalgia  Pain in thoracic spine  Other symptoms and signs involving the musculoskeletal system     Problem List Patient Active Problem List   Diagnosis Date Noted   Impingement syndrome of right shoulder 10/10/2019   Low back  pain 10/10/2019   Irritation of ulnar nerve, left 08/28/2019   Class 2 obesity due to excess calories without serious comorbidity with body mass index (BMI) of 39.0 to 39.9 in adult 01/08/2019   Pain in right elbow 12/06/2018   Primary osteoarthritis of left knee 07/25/2018   History of left knee replacement 07/25/2018   Right foot pain 06/22/2018   Acute left-sided low back pain with left-sided sciatica 06/22/2018   Osteoarthritis 11/21/2017   Hyperlipidemia 11/21/2017   Low testosterone 09/26/2016   Healthcare maintenance 09/26/2016   S/P total knee replacement using cement, right 06/01/2016   Sleep apnea 05/19/2016   Hypertension 05/19/2016      Laureen Abrahams, PT, DPT 01/09/20 10:11 AM     [facility] Physical Therapy [address] [place], [place], [address] Phone: [phone]   Fax:  [phone]  Name: James Gallegos MRN: [id number] Date of Birth: 02-23-1951

## 2020-01-16 ENCOUNTER — Ambulatory Visit (INDEPENDENT_AMBULATORY_CARE_PROVIDER_SITE_OTHER): Payer: 59 | Admitting: Physical Therapy

## 2020-01-16 ENCOUNTER — Other Ambulatory Visit: Payer: Self-pay

## 2020-01-16 ENCOUNTER — Encounter: Payer: Self-pay | Admitting: Physical Therapy

## 2020-01-16 DIAGNOSIS — R29898 Other symptoms and signs involving the musculoskeletal system: Secondary | ICD-10-CM

## 2020-01-16 DIAGNOSIS — M546 Pain in thoracic spine: Secondary | ICD-10-CM

## 2020-01-16 DIAGNOSIS — M542 Cervicalgia: Secondary | ICD-10-CM

## 2020-01-16 NOTE — Therapy (Signed)
[facility] Physical Therapy [address] [place], [place], [address] Phone: [phone]   Fax:  [phone]  Physical Therapy Treatment/Recertification  Patient Details  Name: James Gallegos MRN: [id number] Date of Birth: 12-29-1950 Referring Provider (PT): Dr. Eunice Blase   Encounter Date: 01/16/2020   PT End of Session - 01/16/20 1013    Visit Number 6    Number of Visits 12    Date for PT Re-Evaluation 02/13/20    Authorization Type [facility]    PT Start Time 0931    PT Stop Time 1006    PT Time Calculation (min) 35 min    Activity Tolerance Patient tolerated treatment well;No increased pain    Behavior During Therapy WFL for tasks assessed/performed           Past Medical History:  Diagnosis Date   Arthritis    back & L knee, R foot - lis franc    Deep vein thrombosis ([place])    after knee surgery in 2004   Hypertension    Murmur, cardiac    pt. reports its difficult to auscultate    Sleep apnea    CPAP- last study- 2014    Past Surgical History:  Procedure Laterality Date   CERVICAL SPINE SURGERY N/A 1990   C5-6 discectomy   FINGER SURGERY Right 2008   x 3 index finger   KNEE ARTHROPLASTY     KNEE ARTHROSCOPY Bilateral    L 2004, R 2005   MASS EXCISION Right 01/18/2017   Procedure: EXCISION BENIGN RIGHT NECK MASS;  Surgeon: Rozetta Nunnery, MD;  Location: [place];  Service: ENT;  Laterality: Right;   MASS EXCISION Bilateral 03/23/2019   Procedure: EXCISIONS OF RIGHT CHEEK LESION, RIGHT POSTERIOR NECK LIPOMA  AND LEFT NECK NODULE;  Surgeon: Rozetta Nunnery, MD;  Location: [place];  Service: ENT;  Laterality: Bilateral;   NASAL FRACTURE SURGERY     TOTAL KNEE ARTHROPLASTY Right 06/01/2016   TOTAL KNEE ARTHROPLASTY Right 06/01/2016   Procedure: TOTAL KNEE ARTHROPLASTY;  Surgeon: [name], MD;  Location: [place];  Service: Orthopedics;  Laterality: Right;   TOTAL KNEE  ARTHROPLASTY Left 07/25/2018   Procedure: LEFT TOTAL KNEE ARTHROPLASTY;  Surgeon: [name], MD;  Location: [place];  Service: Orthopedics;  Laterality: Left;    There were no vitals filed for this visit.   Subjective Assessment - 01/16/20 0934    Subjective every day is different, sometimes the pain is worse or neck feels tighter.    Pertinent History PMH: lumbar DDD, previous neck fusion, sleep apnea, HTN, bilat TKA    Limitations Standing;Walking    How long can you stand comfortably? depends but sore and painful if he works all day    Diagnostic tests MRI scan demonstrates degenerative disc disease and degenerative facet disease throughout the lower thoracic and lumbar regions    Patient Stated Goals improve pain in mid back    Currently in Pain? Yes    Pain Score 2     Pain Location Neck    Pain Orientation Left    Pain Descriptors / Indicators Aching;Burning;Stabbing    Pain Type Acute pain;Chronic pain    Pain Onset 1 to 4 weeks ago    Pain Frequency Intermittent    Aggravating Factors  looking up    Pain Relieving Factors rest, looking down    Pain Onset More than a month ago  [facility] PT Assessment - 01/16/20 0956      Assessment   Medical Diagnosis neck pain, pain in thoracic spine    Referring Provider (PT) Dr. Legrand Como [place]      AROM   Cervical - Right Rotation 69    Cervical - Left Rotation 60   pain 2-3/10                          Trigger Point Dry Needling - 01/16/20 0937    Consent Given? Yes    Education Handout Provided Previously provided    Muscles Treated Head and Neck Splenius capitus;Semispinalis capitus    Electrical Stimulation Performed with Dry Needling Yes    E-stim with Dry Needling Details Lt cervical paraspinals to tolerance x 5 min    Splenius capitus Response Twitch reponse elicited;Palpable increased muscle length    Semispinalis capitus Response Twitch reponse elicited                      PT Long Term Goals - 01/16/20 1014      PT LONG TERM GOAL #1   Title independent with HEP    Time 6    Period Weeks    Status On-going    Target Date 02/13/20      PT LONG TERM GOAL #2   Title perform Rt side thoracolumbar sidebending without increase in pain for improved function    Time 6    Period Weeks    Status Achieved      PT LONG TERM GOAL #3   Title report decreased frequency of pain episodes for improved function    Time 6    Period Weeks    Status Achieved      PT LONG TERM GOAL #4   Title Pt will reduce overall pain levels for neck and back to less than 3/10 on avg with ususal activity.    Time 6    Period Weeks    Status Achieved      PT LONG TERM GOAL #5   Title improve Lt cervical rotation to at least 55 deg with pain < 3/10 for improved function and mobility    Baseline 8/4: ROM met, pain occasionally up to 6/10    Time 6    Period Weeks    Status Partially Met      PT LONG TERM GOAL #6   Title improve Lt cervical rotation to at least 68 deg for improved motion, and pain < 4/10    Status New    Target Date 02/13/20                 Plan - 01/16/20 1015    Clinical Impression Statement Pt has met or partially met all LTGs, and continues to have periods of elevated pain.  Overall pain improving, but still elevated.  Will recert today at 1x/wk for up to 4 weeks and plan to follow up next week after neurosurgery appt.  Overall pain improving with PT, and plan to continue with current plan as indicated.    Comorbidities PMH: lumbar DDD, previous neck fusion, sleep apnea, HTN, bilat TKA    Clinical Decision Making Moderate    Rehab Potential Poor    PT Frequency 1x / week    PT Duration 4 weeks    PT Treatment/Interventions ADLs/Self Care Home Management;Aquatic Therapy;Cryotherapy;Electrical Stimulation;Moist Heat;Ultrasound;Therapeutic activities;Therapeutic exercise;Neuromuscular re-education;Manual techniques;Passive range  of motion;Dry needling;Taping;Patient/family education;Traction  PT Next Visit Plan assess response to DN, continue as needed    PT Home Exercise Plan Access Code: [email or web address]    Consulted and Agree with Plan of Care Patient           Patient will benefit from skilled therapeutic intervention in order to improve the following deficits and impairments:  Decreased activity tolerance, Decreased endurance, Decreased strength, Hypomobility, Increased muscle spasms, Impaired flexibility, Pain, Increased fascial restricitons, Decreased range of motion  Visit Diagnosis: Cervicalgia - Plan: PT plan of care cert/re-cert  Pain in thoracic spine - Plan: PT plan of care cert/re-cert  Other symptoms and signs involving the musculoskeletal system - Plan: PT plan of care cert/re-cert     Problem List Patient Active Problem List   Diagnosis Date Noted   Impingement syndrome of right shoulder 10/10/2019   Low back pain 10/10/2019   Irritation of ulnar nerve, left 08/28/2019   Class 2 obesity due to excess calories without serious comorbidity with body mass index (BMI) of 39.0 to 39.9 in adult 01/08/2019   Pain in right elbow 12/06/2018   Primary osteoarthritis of left knee 07/25/2018   History of left knee replacement 07/25/2018   Right foot pain 06/22/2018   Acute left-sided low back pain with left-sided sciatica 06/22/2018   Osteoarthritis 11/21/2017   Hyperlipidemia 11/21/2017   Low testosterone 09/26/2016   Healthcare maintenance 09/26/2016   S/P total knee replacement using cement, right 06/01/2016   Sleep apnea 05/19/2016   Hypertension 05/19/2016      Laureen Abrahams, PT, DPT 01/16/20 10:21 AM    [facility] Physical Therapy [address] [place], [place], [address] Phone: [phone]   Fax:  [phone]  Name: James Gallegos MRN: [id number] Date of Birth: 03-11-1951

## 2020-01-22 DIAGNOSIS — M5030 Other cervical disc degeneration, unspecified cervical region: Secondary | ICD-10-CM | POA: Diagnosis not present

## 2020-01-22 DIAGNOSIS — G5622 Lesion of ulnar nerve, left upper limb: Secondary | ICD-10-CM | POA: Diagnosis not present

## 2020-01-22 DIAGNOSIS — M5412 Radiculopathy, cervical region: Secondary | ICD-10-CM | POA: Diagnosis not present

## 2020-01-22 MED FILL — GABAPENTIN 300 MG CAPSULE: 300 | 30 days supply | Qty: 90 | Fill #0

## 2020-01-23 ENCOUNTER — Ambulatory Visit (INDEPENDENT_AMBULATORY_CARE_PROVIDER_SITE_OTHER): Payer: 59 | Admitting: Physical Therapy

## 2020-01-23 ENCOUNTER — Encounter: Payer: Self-pay | Admitting: Physical Therapy

## 2020-01-23 ENCOUNTER — Other Ambulatory Visit: Payer: Self-pay

## 2020-01-23 DIAGNOSIS — M542 Cervicalgia: Secondary | ICD-10-CM | POA: Diagnosis not present

## 2020-01-23 DIAGNOSIS — R29898 Other symptoms and signs involving the musculoskeletal system: Secondary | ICD-10-CM

## 2020-01-23 DIAGNOSIS — M546 Pain in thoracic spine: Secondary | ICD-10-CM

## 2020-01-23 NOTE — Therapy (Signed)
[facility] Physical Therapy [address] [place], [place], [address] Phone: [phone]   Fax:  [phone]  Physical Therapy Treatment/Discharge Summary  Patient Details  Name: James Gallegos MRN: [id number] Date of Birth: July 15, 1950 Referring Provider (PT): Dr. Eunice Blase   Encounter Date: 01/23/2020   PT End of Session - 01/23/20 1019    Visit Number 7    Date for PT Re-Evaluation 02/13/20    Authorization Type [facility]    PT Start Time 0931    PT Stop Time 1000    PT Time Calculation (min) 29 min    Activity Tolerance Patient tolerated treatment well;No increased pain    Behavior During Therapy WFL for tasks assessed/performed           Past Medical History:  Diagnosis Date  . Arthritis    back & L knee, R foot - lis franc   . Deep vein thrombosis ([place])    after knee surgery in 2004  . Hypertension   . Murmur, cardiac    pt. reports its difficult to auscultate   . Sleep apnea    CPAP- last study- 2014    Past Surgical History:  Procedure Laterality Date  . CERVICAL SPINE SURGERY N/A 1990   C5-6 discectomy  . FINGER SURGERY Right 2008   x 3 index finger  . KNEE ARTHROPLASTY    . KNEE ARTHROSCOPY Bilateral    L 2004, R 2005  . MASS EXCISION Right 01/18/2017   Procedure: EXCISION BENIGN RIGHT NECK MASS;  Surgeon: Rozetta Nunnery, MD;  Location: [place];  Service: ENT;  Laterality: Right;  . MASS EXCISION Bilateral 03/23/2019   Procedure: EXCISIONS OF RIGHT CHEEK LESION, RIGHT POSTERIOR NECK LIPOMA  AND LEFT NECK NODULE;  Surgeon: Rozetta Nunnery, MD;  Location: [place];  Service: ENT;  Laterality: Bilateral;  . NASAL FRACTURE SURGERY    . 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 TOTAL KNEE ARTHROPLASTY Right 06/01/2016   Procedure: TOTAL KNEE ARTHROPLASTY;  Surgeon: [name], MD;  Location: [place];  Service: Orthopedics;  Laterality: Right;  . TOTAL KNEE ARTHROPLASTY Left  07/25/2018   Procedure: LEFT TOTAL KNEE ARTHROPLASTY;  Surgeon: [name], MD;  Location: [place];  Service: Orthopedics;  Laterality: Left;    There were no vitals filed for this visit.   Subjective Assessment - 01/23/20 0933    Subjective NS recommending C3-4 fusion; recommending trying gabapentin x 1 month.  took one last night "and it knocked me on my butt.  I can't function on that stuff."    Pertinent History PMH: lumbar DDD, previous neck fusion, sleep apnea, HTN, bilat TKA    Limitations Standing;Walking    How long can you stand comfortably? depends but sore and painful if he works all day    Diagnostic tests MRI scan demonstrates degenerative disc disease and degenerative facet disease throughout the lower thoracic and lumbar regions    Patient Stated Goals improve pain in mid back    Currently in Pain? Yes    Pain Score 2     Pain Location Neck    Pain Orientation Left    Pain Descriptors / Indicators Aching;Burning;Stabbing    Pain Type Acute pain;Chronic pain    Pain Onset 1 to 4 weeks ago    Pain Frequency Intermittent    Aggravating Factors  looking up    Pain Relieving Factors rest, looking down  [facility] Adult PT Treatment/Exercise - 01/23/20 1017      Self-Care   Self-Care Other Self-Care Comments    Other Self-Care Comments  discussed neurosurgery recommendations as well as current PT plan - agree to d/c today due to surgical recommendation. educated on use of percussive device to help manage symptoms until surgery.  pt verbalized understanding                  PT Education - 01/23/20 1017    Education Details see self care    Person(s) Educated Patient    Methods Explanation    Comprehension Verbalized understanding               PT Long Term Goals - 01/23/20 1019      PT LONG TERM GOAL #1   Title independent with HEP    Time 6    Period Weeks    Status Achieved      PT LONG TERM GOAL #2    Title perform Rt side thoracolumbar sidebending without increase in pain for improved function    Time 6    Period Weeks    Status Achieved      PT LONG TERM GOAL #3   Title report decreased frequency of pain episodes for improved function    Time 6    Period Weeks    Status Achieved      PT LONG TERM GOAL #4   Title Pt will reduce overall pain levels for neck and back to less than 3/10 on avg with ususal activity.    Time 6    Period Weeks    Status Achieved      PT LONG TERM GOAL #5   Title improve Lt cervical rotation to at least 55 deg with pain < 3/10 for improved function and mobility    Baseline 8/4: ROM met, pain occasionally up to 6/10    Time 6    Period Weeks    Status Partially Met      PT LONG TERM GOAL #6   Title improve Lt cervical rotation to at least 68 deg for improved motion, and pain < 4/10    Status Not Met                 Plan - 01/23/20 1019    Clinical Impression Statement Pt had NS consult earlier this week and they are recommending C3-6 ACDF so agreed to d/c today.  Overall thoracic spine pain has completely resolved, still has persistent cervical pain at this time.    Comorbidities PMH: lumbar DDD, previous neck fusion, sleep apnea, HTN, bilat TKA    Rehab Potential Poor    PT Frequency 1x / week    PT Duration 4 weeks    PT Treatment/Interventions ADLs/Self Care Home Management;Aquatic Therapy;Cryotherapy;Electrical Stimulation;Moist Heat;Ultrasound;Therapeutic activities;Therapeutic exercise;Neuromuscular re-education;Manual techniques;Passive range of motion;Dry needling;Taping;Patient/family education;Traction    PT Next Visit Plan d/c PT today    PT Home Exercise Plan Access Code: [email or web address]    Consulted and Agree with Plan of Care Patient           Patient will benefit from skilled therapeutic intervention in order to improve the following deficits and impairments:  Decreased activity tolerance, Decreased endurance, Decreased  strength, Hypomobility, Increased muscle spasms, Impaired flexibility, Pain, Increased fascial restricitons, Decreased range of motion  Visit Diagnosis: Cervicalgia  Pain in thoracic spine  Other symptoms and signs involving the musculoskeletal system     Problem List Patient  Active Problem List   Diagnosis Date Noted  . Impingement syndrome of right shoulder 10/10/2019  . Low back pain 10/10/2019  . Irritation of ulnar nerve, left 08/28/2019  . Class 2 obesity due to excess calories without serious comorbidity with body mass index (BMI) of 39.0 to 39.9 in adult 01/08/2019  . Pain in right elbow 12/06/2018  . Primary osteoarthritis of left knee 07/25/2018  . History of left knee replacement 07/25/2018  . Right foot pain 06/22/2018  . Acute left-sided low back pain with left-sided sciatica 06/22/2018  . Osteoarthritis 11/21/2017  . Hyperlipidemia 11/21/2017  . Low testosterone 09/26/2016  . Healthcare maintenance 09/26/2016  . S/P total knee replacement using cement, right 06/01/2016  . Sleep apnea 05/19/2016  . Hypertension 05/19/2016      Laureen Abrahams, PT, DPT 01/23/20 10:22 AM    [facility] Physical Therapy [address] [place], [place], [address] Phone: [phone]   Fax:  [phone]  Name: VALERIE FREDIN MRN: [id number] Date of Birth: 1950-12-04      PHYSICAL THERAPY DISCHARGE SUMMARY  Visits from Start of Care: 7  Current functional level related to goals / functional outcomes: See above   Remaining deficits: See above   Education / Equipment: HEP, DN  Plan: Patient agrees to discharge.  Patient goals were partially met. Patient is being discharged due to the patient's request.  ?????    Overall all goals met with regards to thoracic spine pain; will need surgery for neck.  Laureen Abrahams, PT, DPT 01/23/20 10:23 AM  [facility] Physical Therapy [address] [place], [place],  [address] Phone: [phone]   Fax:  [phone]

## 2020-01-28 ENCOUNTER — Encounter: Payer: Self-pay | Admitting: Family Medicine

## 2020-03-14 ENCOUNTER — Other Ambulatory Visit: Payer: Self-pay | Admitting: Neurosurgery

## 2020-03-14 DIAGNOSIS — M4712 Other spondylosis with myelopathy, cervical region: Secondary | ICD-10-CM | POA: Diagnosis not present

## 2020-03-14 DIAGNOSIS — Z6830 Body mass index (BMI) 30.0-30.9, adult: Secondary | ICD-10-CM | POA: Diagnosis not present

## 2020-03-14 DIAGNOSIS — I10 Essential (primary) hypertension: Secondary | ICD-10-CM | POA: Diagnosis not present

## 2020-03-17 ENCOUNTER — Telehealth: Payer: Self-pay | Admitting: Family Medicine

## 2020-03-17 ENCOUNTER — Encounter: Payer: Self-pay | Admitting: Physical Therapy

## 2020-03-17 DIAGNOSIS — M546 Pain in thoracic spine: Secondary | ICD-10-CM

## 2020-03-17 DIAGNOSIS — M542 Cervicalgia: Secondary | ICD-10-CM

## 2020-03-17 NOTE — Telephone Encounter (Signed)
-----   Message from Laureen Abrahams, PT sent at 03/17/2020  9:18 AM EDT ----- Regarding: Pt requesting DN, need new referral Dr. Junius RoadsIona Beard reached out to me for more DN for his thoracic spine pain.  I've already discharged that episode of care.  Can you enter a new referral for me please?  Thanks- Colletta [place]

## 2020-03-19 ENCOUNTER — Ambulatory Visit (INDEPENDENT_AMBULATORY_CARE_PROVIDER_SITE_OTHER): Payer: 59 | Admitting: Physical Therapy

## 2020-03-19 ENCOUNTER — Other Ambulatory Visit: Payer: Self-pay

## 2020-03-19 ENCOUNTER — Encounter: Payer: Self-pay | Admitting: Physical Therapy

## 2020-03-19 DIAGNOSIS — M546 Pain in thoracic spine: Secondary | ICD-10-CM | POA: Diagnosis not present

## 2020-03-19 DIAGNOSIS — R29898 Other symptoms and signs involving the musculoskeletal system: Secondary | ICD-10-CM | POA: Diagnosis not present

## 2020-03-19 NOTE — Therapy (Signed)
[facility] [address] [place], [place], [address] Phone: [phone]   Fax:  [phone]  Physical Therapy Evaluation  Patient Details  Name: James Gallegos MRN: [id number] Date of Birth: 1950-06-15 Referring Provider (PT): Eunice Blase, MD   Encounter Date: 03/19/2020   PT End of Session - 03/19/20 1124    Visit Number 1    Number of Visits 4    Date for PT Re-Evaluation 04/16/20    Authorization Type [facility]    PT Start Time 2993    PT Stop Time 1045    PT Time Calculation (min) 30 min    Activity Tolerance Patient tolerated treatment well;No increased pain    Behavior During Therapy WFL for tasks assessed/performed           Past Medical History:  Diagnosis Date  . Arthritis    back & L knee, R foot - lis franc   . Deep vein thrombosis ([place])    after knee surgery in 2004  . Hypertension   . Murmur, cardiac    pt. reports its difficult to auscultate   . Sleep apnea    CPAP- last study- 2014    Past Surgical History:  Procedure Laterality Date  . CERVICAL SPINE SURGERY N/A 1990   C5-6 discectomy  . FINGER SURGERY Right 2008   x 3 index finger  . KNEE ARTHROPLASTY    . KNEE ARTHROSCOPY Bilateral    L 2004, R 2005  . MASS EXCISION Right 01/18/2017   Procedure: EXCISION BENIGN RIGHT NECK MASS;  Surgeon: Rozetta Nunnery, MD;  Location: [place];  Service: ENT;  Laterality: Right;  . MASS EXCISION Bilateral 03/23/2019   Procedure: EXCISIONS OF RIGHT CHEEK LESION, RIGHT POSTERIOR NECK LIPOMA  AND LEFT NECK NODULE;  Surgeon: Rozetta Nunnery, MD;  Location: [place];  Service: ENT;  Laterality: Bilateral;  . NASAL FRACTURE SURGERY    . 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 TOTAL KNEE ARTHROPLASTY Right 06/01/2016   Procedure: TOTAL KNEE ARTHROPLASTY;  Surgeon: [name], MD;  Location: [place];  Service: Orthopedics;  Laterality: Right;  . TOTAL KNEE ARTHROPLASTY Left  07/25/2018   Procedure: LEFT TOTAL KNEE ARTHROPLASTY;  Surgeon: [name], MD;  Location: [place];  Service: Orthopedics;  Laterality: Left;    There were no vitals filed for this visit.    Subjective Assessment - 03/19/20 1018    Subjective Returns to PT with thoracic spine pain that has returned.  Has ACDF scheduled for 11/1.  5 hours after getting vaccine had increased back pain - vaccine was 03/10/20.    Pertinent History PMH: lumbar DDD, previous neck fusion, sleep apnea, HTN, bilat TKA    Limitations Standing;Walking    How long can you stand comfortably? depends but sore and painful if he works all day    Diagnostic tests MRI scan demonstrates degenerative disc disease and degenerative facet disease throughout the lower thoracic and lumbar regions    Patient Stated Goals improve pain in mid back    Currently in Pain? Yes    Pain Score 4     Pain Location Back    Pain Orientation Right;Mid    Pain Descriptors / Indicators Tightness;Spasm    Pain Type Acute pain    Pain Onset 1 to 4 weeks ago    Pain Frequency Intermittent    Aggravating Factors  turning or sneezing    Pain Relieving Factors nothing  [facility] PT Assessment - 03/19/20 1022      Assessment   Medical Diagnosis Pain in thoracic spine    Referring Provider (PT) Eunice Blase, MD    Onset Date/Surgical Date 03/10/20    Hand Dominance Right    Next MD Visit PRN    Prior Therapy recently for same condition      Balance Screen   Has the patient fallen in the past 6 months No    Has the patient had a decrease in activity level because of a fear of falling?  No    Is the patient reluctant to leave their home because of a fear of falling?  No      Home Ecologist residence      Prior Function   Vocation Full time employment    Vocation Requirements real estate appraiser      ROM / Strength   AROM / PROM / Strength AROM      AROM   Overall AROM Comments pain  with thoracolumbar extension and Lt quadrant       Palpation   Palpation comment active trigger points Rt lower lats around T9-11                      Objective measurements completed on examination: See above findings.       [facility] [place] Adult PT Treatment/Exercise - 03/19/20 1122      Manual Therapy   Manual Therapy Soft tissue mobilization    Manual therapy comments skilled palpation and monitoring of soft tissue during DN    Soft tissue mobilization Rt lower lats            Trigger Point Dry Needling - 03/19/20 1123    Consent Given? Yes    Education Handout Provided Previously provided   recent PT episode   Muscles Treated Upper Quadrant Latissimus dorsi    Electrical Stimulation Performed with Dry Needling Yes    E-stim with Dry Needling Details Rt lats to tolerance x 5 min    Latissimus dorsi Response Twitch response elicited                PT Education - 03/19/20 1124    Education Details continue with tennis ball for self massage - continue with prior HEP    Person(s) Educated Patient    Methods Explanation    Comprehension Verbalized understanding               PT Long Term Goals - 03/19/20 1127      PT LONG TERM GOAL #1   Title independent with HEP    Time 4    Period Weeks    Status New    Target Date 04/16/20      PT LONG TERM GOAL #2   Title perform Rt side thoracolumbar sidebending without increase in pain for improved function    Time 4    Period Weeks    Status New    Target Date 04/16/20      PT LONG TERM GOAL #3   Title report decreased frequency of pain episodes for improved function    Time 4    Period Weeks    Status New    Target Date 04/16/20      PT LONG TERM GOAL #4   Title n/a      PT LONG TERM GOAL #5   Title n/a      PT LONG TERM  GOAL #6   Title n/a                  Plan - 03/19/20 1124    Clinical Impression Statement Pt is a 69 y/o male who presents to OPPT for return of Rt side lat  trigger point along lower thoracic spine afftecting mobility and function.  Pt with positive response from DN and estim last episode of care, so repeated today with improvement in symptoms immediately following.  Will benefit from PT to address deficits listed.  Pt scheduled for ACDF 11/1 so plan to see until that time.    Personal Factors and Comorbidities Past/Current Experience    Comorbidities PMH: lumbar DDD, previous neck fusion, sleep apnea, HTN, bilat TKA    Examination-Activity Limitations Sleep;Bend;Hygiene/Grooming;Lift;Reach Overhead    Examination-Participation Restrictions Driving;Occupation;Yard Work    Stability/Clinical Decision Making Stable/Uncomplicated    Designer, jewellery Low    Rehab Potential Good    PT Frequency 1x / week    PT Duration 4 weeks    PT Treatment/Interventions ADLs/Self Care Home Management;Aquatic Therapy;Cryotherapy;Electrical Stimulation;Moist Heat;Ultrasound;Therapeutic activities;Therapeutic exercise;Neuromuscular re-education;Manual techniques;Passive range of motion;Dry needling;Taping;Patient/family education;Traction    PT Next Visit Plan assess response to DN, continue PRN    PT Home Exercise Plan Access Code: [email or web address]    Consulted and Agree with Plan of Care Patient           Patient will benefit from skilled therapeutic intervention in order to improve the following deficits and impairments:  Decreased activity tolerance, Hypomobility, Increased muscle spasms, Impaired flexibility, Pain, Increased fascial restricitons, Decreased range of motion  Visit Diagnosis: Pain in thoracic spine - Plan: PT plan of care cert/re-cert  Other symptoms and signs involving the musculoskeletal system - Plan: PT plan of care cert/re-cert     Problem List Patient Active Problem List   Diagnosis Date Noted  . Impingement syndrome of right shoulder 10/10/2019  . Low back pain 10/10/2019  . Irritation of ulnar nerve, left 08/28/2019  . Class 2  obesity due to excess calories without serious comorbidity with body mass index (BMI) of 39.0 to 39.9 in adult 01/08/2019  . Pain in right elbow 12/06/2018  . Primary osteoarthritis of left knee 07/25/2018  . History of left knee replacement 07/25/2018  . Right foot pain 06/22/2018  . Acute left-sided low back pain with left-sided sciatica 06/22/2018  . Osteoarthritis 11/21/2017  . Hyperlipidemia 11/21/2017  . Low testosterone 09/26/2016  . Healthcare maintenance 09/26/2016  . S/P total knee replacement using cement, right 06/01/2016  . Sleep apnea 05/19/2016  . Hypertension 05/19/2016      Laureen Abrahams, PT, DPT 03/19/20 11:32 AM     [facility] [address] [place], [place], [address] Phone: [phone]   Fax:  [phone]  Name: AIRAM HEIDECKER MRN: [id number] Date of Birth: 06-Feb-1951

## 2020-03-24 ENCOUNTER — Other Ambulatory Visit: Payer: Self-pay

## 2020-03-24 ENCOUNTER — Ambulatory Visit (INDEPENDENT_AMBULATORY_CARE_PROVIDER_SITE_OTHER): Payer: 59 | Admitting: Physical Therapy

## 2020-03-24 ENCOUNTER — Encounter: Payer: Self-pay | Admitting: Physical Therapy

## 2020-03-24 DIAGNOSIS — R29898 Other symptoms and signs involving the musculoskeletal system: Secondary | ICD-10-CM | POA: Diagnosis not present

## 2020-03-24 DIAGNOSIS — M546 Pain in thoracic spine: Secondary | ICD-10-CM | POA: Diagnosis not present

## 2020-03-24 NOTE — Therapy (Signed)
[facility] [address] [place], [place], [address] Phone: [phone]   Fax:  [phone]  Physical Therapy Treatment  Patient Details  Name: James Gallegos MRN: [id number] Date of Birth: Dec 01, 1950 Referring Provider (PT): Eunice Blase, MD   Encounter Date: 03/24/2020   PT End of Session - 03/24/20 1003    Visit Number 2    Number of Visits 4    Date for PT Re-Evaluation 04/16/20    Authorization Type [facility]    PT Start Time 0930    PT Stop Time 1003    PT Time Calculation (min) 33 min    Activity Tolerance Patient tolerated treatment well;No increased pain    Behavior During Therapy WFL for tasks assessed/performed           Past Medical History:  Diagnosis Date   Arthritis    back & L knee, R foot - lis franc    Deep vein thrombosis ([place])    after knee surgery in 2004   Hypertension    Murmur, cardiac    pt. reports its difficult to auscultate    Sleep apnea    CPAP- last study- 2014    Past Surgical History:  Procedure Laterality Date   CERVICAL SPINE SURGERY N/A 1990   C5-6 discectomy   FINGER SURGERY Right 2008   x 3 index finger   KNEE ARTHROPLASTY     KNEE ARTHROSCOPY Bilateral    L 2004, R 2005   MASS EXCISION Right 01/18/2017   Procedure: EXCISION BENIGN RIGHT NECK MASS;  Surgeon: Rozetta Nunnery, MD;  Location: [place];  Service: ENT;  Laterality: Right;   MASS EXCISION Bilateral 03/23/2019   Procedure: EXCISIONS OF RIGHT CHEEK LESION, RIGHT POSTERIOR NECK LIPOMA  AND LEFT NECK NODULE;  Surgeon: Rozetta Nunnery, MD;  Location: [place];  Service: ENT;  Laterality: Bilateral;   NASAL FRACTURE SURGERY     TOTAL KNEE ARTHROPLASTY Right 06/01/2016   TOTAL KNEE ARTHROPLASTY Right 06/01/2016   Procedure: TOTAL KNEE ARTHROPLASTY;  Surgeon: [name], MD;  Location: [place];  Service: Orthopedics;  Laterality: Right;   TOTAL KNEE ARTHROPLASTY Left  07/25/2018   Procedure: LEFT TOTAL KNEE ARTHROPLASTY;  Surgeon: [name], MD;  Location: [place];  Service: Orthopedics;  Laterality: Left;    There were no vitals filed for this visit.   Subjective Assessment - 03/24/20 0927    Subjective feels a little bit better - still has a pretty significant trigger point.    Pertinent History PMH: lumbar DDD, previous neck fusion, sleep apnea, HTN, bilat TKA    Limitations Standing;Walking    How long can you stand comfortably? depends but sore and painful if he works all day    Diagnostic tests MRI scan demonstrates degenerative disc disease and degenerative facet disease throughout the lower thoracic and lumbar regions    Patient Stated Goals improve pain in mid back    Currently in Pain? Yes    Pain Score 3     Pain Location Back    Pain Orientation Right;Mid    Pain Descriptors / Indicators Tightness;Spasm    Pain Type Acute pain    Pain Onset 1 to 4 weeks ago    Pain Frequency Intermittent    Aggravating Factors  turning or sneezing    Pain Relieving Factors nothing  [facility] Adult PT Treatment/Exercise - 03/24/20 0940      Manual Therapy   Manual Therapy Soft tissue mobilization    Manual therapy comments skilled palpation and monitoring of soft tissue during DN    Soft tissue mobilization Rt lower lats            Trigger Point Dry Needling - 03/24/20 0941    Consent Given? Yes    Education Handout Provided Previously provided    Muscles Treated Upper Quadrant Latissimus dorsi    Electrical Stimulation Performed with Dry Needling Yes    E-stim with Dry Needling Details Rt lats to tolerance x 5 min    Latissimus dorsi Response Twitch response elicited                     PT Long Term Goals - 03/24/20 1003      PT LONG TERM GOAL #1   Title independent with HEP    Time 4    Period Weeks    Status On-going      PT LONG TERM GOAL #2   Title perform Rt side  thoracolumbar sidebending without increase in pain for improved function    Time 4    Period Weeks    Status On-going      PT LONG TERM GOAL #3   Title report decreased frequency of pain episodes for improved function    Time 4    Period Weeks    Status On-going      PT LONG TERM GOAL #4   Title n/a      PT LONG TERM GOAL #5   Title n/a      PT LONG TERM GOAL #6   Title n/a                 Plan - 03/24/20 1003    Clinical Impression Statement Pt reported mild improvement in symptoms after last session and decreased pain after today's session. Overall progressing well and hopeful for resolve of trigger point in next 1-2 visits prior ot surgery.    Personal Factors and Comorbidities Past/Current Experience    Comorbidities PMH: lumbar DDD, previous neck fusion, sleep apnea, HTN, bilat TKA    Examination-Activity Limitations Sleep;Bend;Hygiene/Grooming;Lift;Reach Overhead    Examination-Participation Restrictions Driving;Occupation;Yard Work    Stability/Clinical Decision Making Stable/Uncomplicated    Rehab Potential Good    PT Frequency 1x / week    PT Duration 4 weeks    PT Treatment/Interventions ADLs/Self Care Home Management;Aquatic Therapy;Cryotherapy;Electrical Stimulation;Moist Heat;Ultrasound;Therapeutic activities;Therapeutic exercise;Neuromuscular re-education;Manual techniques;Passive range of motion;Dry needling;Taping;Patient/family education;Traction    PT Next Visit Plan assess response to DN, continue PRN    PT Home Exercise Plan Access Code: [email or web address]    Consulted and Agree with Plan of Care Patient           Patient will benefit from skilled therapeutic intervention in order to improve the following deficits and impairments:  Decreased activity tolerance, Hypomobility, Increased muscle spasms, Impaired flexibility, Pain, Increased fascial restricitons, Decreased range of motion  Visit Diagnosis: Pain in thoracic spine  Other symptoms and signs  involving the musculoskeletal system     Problem List Patient Active Problem List   Diagnosis Date Noted   Impingement syndrome of right shoulder 10/10/2019   Low back pain 10/10/2019   Irritation of ulnar nerve, left 08/28/2019   Class 2 obesity due to excess calories without serious comorbidity with body mass index (BMI) of 39.0 to 39.9 in adult 01/08/2019  Pain in right elbow 12/06/2018   Primary osteoarthritis of left knee 07/25/2018   History of left knee replacement 07/25/2018   Right foot pain 06/22/2018   Acute left-sided low back pain with left-sided sciatica 06/22/2018   Osteoarthritis 11/21/2017   Hyperlipidemia 11/21/2017   Low testosterone 09/26/2016   Healthcare maintenance 09/26/2016   S/P total knee replacement using cement, right 06/01/2016   Sleep apnea 05/19/2016   Hypertension 05/19/2016      Laureen Abrahams, PT, DPT 03/24/20 10:06 AM    [facility] Therapy [address] [place], [place], [address] Phone: [phone]   Fax:  [phone]  Name: James Gallegos MRN: [id number] Date of Birth: 10/20/1950

## 2020-03-26 ENCOUNTER — Other Ambulatory Visit: Payer: Self-pay | Admitting: Neurosurgery

## 2020-03-31 ENCOUNTER — Encounter: Payer: 59 | Admitting: Physical Therapy

## 2020-04-03 ENCOUNTER — Other Ambulatory Visit: Payer: Self-pay

## 2020-04-03 ENCOUNTER — Encounter: Payer: Self-pay | Admitting: Orthopaedic Surgery

## 2020-04-03 ENCOUNTER — Ambulatory Visit (INDEPENDENT_AMBULATORY_CARE_PROVIDER_SITE_OTHER): Payer: 59 | Admitting: Orthopaedic Surgery

## 2020-04-03 VITALS — Ht 69.0 in | Wt 222.0 lb

## 2020-04-03 DIAGNOSIS — M7541 Impingement syndrome of right shoulder: Secondary | ICD-10-CM

## 2020-04-03 NOTE — Progress Notes (Signed)
Office Visit Note   Patient: James Gallegos           Date of Birth: 23-Jun-1950           MRN: [id number] Visit Date: 04/03/2020              Requested by: Eunice Blase, MD [address] [place],  [place] [address] PCP: Eunice Blase, MD   Assessment & Plan: Visit Diagnoses:  1. Impingement syndrome of right shoulder     Plan: [name] has been seen several times for the problem referable to his right shoulder.  In late spring he had a cortisone injection that helped for "short time".  The pain is recurred to the point of compromise.  He is having difficulty raising his arm overhead and even sleeping on that side.  I am concerned that he has pathology of the biceps tendon as well as a tear of the rotator cuff.  We will obtain an MRI scan Follow-Up Instructions: Return in about 1 week (around 04/10/2020), or After MRI right shoulder.   Orders:  No orders of the defined types were placed in this encounter.  No orders of the defined types were placed in this encounter.     Procedures: No procedures performed   Clinical Data: No additional findings.   Subjective: Chief Complaint  Patient presents with  . Right Shoulder - Pain  Patient presents today for his right shoulder. He was here on 10/10/2019 and received a right shoulder injection. He states that the injection only helped for a few days. His shoulder is getting worse over time. It is very sore at night and will wake him if he moves it the right direction. Any movement or lifting makes his shoulder hurt more. He takes Ibuprofen as needed. He is right hand dominant.  Having considerable compromise of his daily activities. He is scheduled for cervical spine fusion by Dr. Arnoldo Morale in the next several weeks. Films of his right shoulder were negative for any obvious pathology 6 months ago  HPI  Review of Systems  Constitutional: Negative for fatigue.  HENT: Negative for ear pain.   Eyes: Negative for pain.    Respiratory: Negative for shortness of breath.   Cardiovascular: Negative for leg swelling.  Gastrointestinal: Negative for constipation and diarrhea.  Endocrine: Negative for cold intolerance and heat intolerance.  Musculoskeletal: Negative for joint swelling.  Skin: Negative for rash.  Allergic/Immunologic: Negative for food allergies.  Neurological: Negative for weakness.  Hematological: Does not bruise/bleed easily.  Psychiatric/Behavioral: Positive for sleep disturbance.     Objective: Vital Signs: Ht 5\' 9"  (1.753 m)   Wt 222 lb (100.7 kg)   BMI 32.78 kg/m   Physical Exam Constitutional:      Appearance: He is well-developed.  Eyes:     Pupils: Pupils are equal, round, and reactive to light.  Pulmonary:     Effort: Pulmonary effort is normal.  Skin:    General: Skin is warm and dry.  Neurological:     Mental Status: He is alert and oriented to person, place, and time.  Psychiatric:        Behavior: Behavior normal.     Ortho Exam awake alert and oriented x3.  Comfortable sitting able to place right arm overhead but with a circuitous arc of motion.  Positive impingement and empty can testing.  Does have a mildly positive Speed sign.  Biceps intact.  Skin intact.  No tenderness at the [facility] joint or  the anterior or lateral subacromial space.  Has some weakness with external rotation but not with internal rotation.  Specialty Comments:  No specialty comments available.  Imaging: No results found.   PMFS History: Patient Active Problem List   Diagnosis Date Noted  . Impingement syndrome of right shoulder 10/10/2019  . Low back pain 10/10/2019  . Irritation of ulnar nerve, left 08/28/2019  . Class 2 obesity due to excess calories without serious comorbidity with body mass index (BMI) of 39.0 to 39.9 in adult 01/08/2019  . Pain in right elbow 12/06/2018  . Primary osteoarthritis of left knee 07/25/2018  . History of left knee replacement 07/25/2018  . Right foot  pain 06/22/2018  . Acute left-sided low back pain with left-sided sciatica 06/22/2018  . Osteoarthritis 11/21/2017  . Hyperlipidemia 11/21/2017  . Low testosterone 09/26/2016  . Healthcare maintenance 09/26/2016  . S/P total knee replacement using cement, right 06/01/2016  . Sleep apnea 05/19/2016  . Hypertension 05/19/2016   Past Medical History:  Diagnosis Date  . Arthritis    back & L knee, R foot - lis franc   . Deep vein thrombosis ([place])    after knee surgery in 2004  . Hypertension   . Murmur, cardiac    pt. reports its difficult to auscultate   . Sleep apnea    CPAP- last study- 2014    Family History  Problem Relation Age of Onset  . Alzheimer's disease Father   . Colon cancer Neg Hx   . Esophageal cancer Neg Hx   . Rectal cancer Neg Hx   . Stomach cancer Neg Hx     Past Surgical History:  Procedure Laterality Date  . CERVICAL SPINE SURGERY N/A 1990   C5-6 discectomy  . FINGER SURGERY Right 2008   x 3 index finger  . KNEE ARTHROPLASTY    . KNEE ARTHROSCOPY Bilateral    L 2004, R 2005  . MASS EXCISION Right 01/18/2017   Procedure: EXCISION BENIGN RIGHT NECK MASS;  Surgeon: Rozetta Nunnery, MD;  Location: [place];  Service: ENT;  Laterality: Right;  . MASS EXCISION Bilateral 03/23/2019   Procedure: EXCISIONS OF RIGHT CHEEK LESION, RIGHT POSTERIOR NECK LIPOMA  AND LEFT NECK NODULE;  Surgeon: Rozetta Nunnery, MD;  Location: [place];  Service: ENT;  Laterality: Bilateral;  . NASAL FRACTURE SURGERY    . 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 TOTAL KNEE ARTHROPLASTY Right 06/01/2016   Procedure: TOTAL KNEE ARTHROPLASTY;  Surgeon: [name], MD;  Location: [place];  Service: Orthopedics;  Laterality: Right;  . TOTAL KNEE ARTHROPLASTY Left 07/25/2018   Procedure: LEFT TOTAL KNEE ARTHROPLASTY;  Surgeon: [name], MD;  Location: [place];  Service: Orthopedics;  Laterality: Left;   Social History    Occupational History  . Not on file  Tobacco Use  . Smoking status: Never Smoker  . Smokeless tobacco: Never Used  Vaping Use  . Vaping Use: Never used  Substance and Sexual Activity  . Alcohol use: Yes    Comment: rarely  . Drug use: No  . Sexual activity: Not on file

## 2020-04-07 ENCOUNTER — Other Ambulatory Visit: Payer: Self-pay | Admitting: Family Medicine

## 2020-04-07 MED FILL — LOSARTAN-HCTZ 100-25 MG TAB: 100-25 | 90 days supply | Qty: 90 | Fill #0

## 2020-04-08 ENCOUNTER — Other Ambulatory Visit: Payer: Self-pay

## 2020-04-08 ENCOUNTER — Ambulatory Visit
Admission: RE | Admit: 2020-04-08 | Discharge: 2020-04-08 | Disposition: A | Discharge status: Home / Self Care | Payer: 59 | Source: Ambulatory Visit | Attending: Orthopaedic Surgery | Admitting: Orthopaedic Surgery

## 2020-04-08 ENCOUNTER — Encounter: Payer: Self-pay | Admitting: Physical Therapy

## 2020-04-08 ENCOUNTER — Ambulatory Visit (INDEPENDENT_AMBULATORY_CARE_PROVIDER_SITE_OTHER): Payer: 59 | Admitting: Physical Therapy

## 2020-04-08 DIAGNOSIS — M19011 Primary osteoarthritis, right shoulder: Secondary | ICD-10-CM | POA: Diagnosis not present

## 2020-04-08 DIAGNOSIS — R29898 Other symptoms and signs involving the musculoskeletal system: Secondary | ICD-10-CM

## 2020-04-08 DIAGNOSIS — M546 Pain in thoracic spine: Secondary | ICD-10-CM

## 2020-04-08 DIAGNOSIS — M7541 Impingement syndrome of right shoulder: Secondary | ICD-10-CM

## 2020-04-08 NOTE — Therapy (Addendum)
[facility] Physical Therapy [address] [place], [place], [address] Phone: [phone]   Fax:  [phone]  Physical Therapy Treatment/Discharge Summary  Patient Details  Name: James Gallegos MRN: [id number] Date of Birth: Nov 03, 1950 Referring Provider (PT): Eunice Blase, MD   Encounter Date: 04/08/2020   PT End of Session - 04/08/20 0916    Visit Number 3    Number of Visits 4    Date for PT Re-Evaluation 04/16/20    Authorization Type [facility]    PT Start Time 0845    PT Stop Time 0915    PT Time Calculation (min) 30 min    Activity Tolerance Patient tolerated treatment well;No increased pain    Behavior During Therapy WFL for tasks assessed/performed           Past Medical History:  Diagnosis Date  . Arthritis    back & L knee, R foot - lis franc   . Deep vein thrombosis ([place])    after knee surgery in 2004  . Hypertension   . Murmur, cardiac    pt. reports its difficult to auscultate   . Sleep apnea    CPAP- last study- 2014    Past Surgical History:  Procedure Laterality Date  . CERVICAL SPINE SURGERY N/A 1990   C5-6 discectomy  . FINGER SURGERY Right 2008   x 3 index finger  . KNEE ARTHROPLASTY    . KNEE ARTHROSCOPY Bilateral    L 2004, R 2005  . MASS EXCISION Right 01/18/2017   Procedure: EXCISION BENIGN RIGHT NECK MASS;  Surgeon: Rozetta Nunnery, MD;  Location: [place];  Service: ENT;  Laterality: Right;  . MASS EXCISION Bilateral 03/23/2019   Procedure: EXCISIONS OF RIGHT CHEEK LESION, RIGHT POSTERIOR NECK LIPOMA  AND LEFT NECK NODULE;  Surgeon: Rozetta Nunnery, MD;  Location: [place];  Service: ENT;  Laterality: Bilateral;  . NASAL FRACTURE SURGERY    . 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 TOTAL KNEE ARTHROPLASTY Right 06/01/2016   Procedure: TOTAL KNEE ARTHROPLASTY;  Surgeon: [name], MD;  Location: [place];  Service: Orthopedics;  Laterality: Right;  . TOTAL KNEE  ARTHROPLASTY Left 07/25/2018   Procedure: LEFT TOTAL KNEE ARTHROPLASTY;  Surgeon: [name], MD;  Location: [place];  Service: Orthopedics;  Laterality: Left;    There were no vitals filed for this visit.   Subjective Assessment - 04/08/20 0846    Subjective has an MRI on his shoulder this afternoon - conerning for RTC and/or biceps tear    Pertinent History PMH: lumbar DDD, previous neck fusion, sleep apnea, HTN, bilat TKA    Limitations Standing;Walking    How long can you stand comfortably? depends but sore and painful if he works all day    Diagnostic tests MRI scan demonstrates degenerative disc disease and degenerative facet disease throughout the lower thoracic and lumbar regions    Patient Stated Goals improve pain in mid back    Currently in Pain? No/denies                             [facility] Adult PT Treatment/Exercise - 04/08/20 0915      Manual Therapy   Manual therapy comments skilled palpation and monitoring of soft tissue during DN    Soft tissue mobilization Rt lower lats            Trigger Point Dry Needling -  04/08/20 0915    Consent Given? Yes    Education Handout Provided Previously provided    Muscles Treated Upper Quadrant Latissimus dorsi    Electrical Stimulation Performed with Dry Needling Yes    E-stim with Dry Needling Details Rt lats to tolerance x 5 min    Latissimus dorsi Response Twitch response elicited                     PT Long Term Goals - 04/08/20 0916      PT LONG TERM GOAL #1   Title independent with HEP    Time 4    Period Weeks    Status On-going    Target Date 04/16/20      PT LONG TERM GOAL #2   Title perform Rt side thoracolumbar sidebending without increase in pain for improved function    Time 4    Period Weeks    Status Achieved      PT LONG TERM GOAL #3   Title report decreased frequency of pain episodes for improved function    Time 4    Period Weeks    Status Achieved      PT  LONG TERM GOAL #4   Title n/a      PT LONG TERM GOAL #5   Title n/a      PT LONG TERM GOAL #6   Title n/a                 Plan - 04/08/20 0916    Clinical Impression Statement Pt with improvement in overall symptoms at this time noting reduction in frequency and intensity.  Scheduled for ACDF next week. Will hold PT for now.    Personal Factors and Comorbidities Past/Current Experience    Comorbidities PMH: lumbar DDD, previous neck fusion, sleep apnea, HTN, bilat TKA    Examination-Activity Limitations Sleep;Bend;Hygiene/Grooming;Lift;Reach Overhead    Examination-Participation Restrictions Driving;Occupation;Yard Work    Stability/Clinical Decision Making Stable/Uncomplicated    Rehab Potential Good    PT Frequency 1x / week    PT Duration 4 weeks    PT Treatment/Interventions ADLs/Self Care Home Management;Aquatic Therapy;Cryotherapy;Electrical Stimulation;Moist Heat;Ultrasound;Therapeutic activities;Therapeutic exercise;Neuromuscular re-education;Manual techniques;Passive range of motion;Dry needling;Taping;Patient/family education;Traction    PT Next Visit Plan hold PT for now    PT Home Exercise Plan Access Code: [email or web address]    Consulted and Agree with Plan of Care Patient           Patient will benefit from skilled therapeutic intervention in order to improve the following deficits and impairments:  Decreased activity tolerance, Hypomobility, Increased muscle spasms, Impaired flexibility, Pain, Increased fascial restricitons, Decreased range of motion  Visit Diagnosis: Pain in thoracic spine  Other symptoms and signs involving the musculoskeletal system     Problem List Patient Active Problem List   Diagnosis Date Noted  . Impingement syndrome of right shoulder 10/10/2019  . Low back pain 10/10/2019  . Irritation of ulnar nerve, left 08/28/2019  . Class 2 obesity due to excess calories without serious comorbidity with body mass index (BMI) of 39.0 to 39.9 in  adult 01/08/2019  . Pain in right elbow 12/06/2018  . Primary osteoarthritis of left knee 07/25/2018  . History of left knee replacement 07/25/2018  . Right foot pain 06/22/2018  . Acute left-sided low back pain with left-sided sciatica 06/22/2018  . Osteoarthritis 11/21/2017  . Hyperlipidemia 11/21/2017  . Low testosterone 09/26/2016  . Healthcare maintenance 09/26/2016  . S/P total knee replacement  using cement, right 06/01/2016  . Sleep apnea 05/19/2016  . Hypertension 05/19/2016      Laureen Abrahams, PT, DPT 04/08/20 9:18 AM    [facility] Physical Therapy [address] [place], [place], [address] Phone: [phone]   Fax:  [phone]  Name: James Gallegos MRN: [id number] Date of Birth: 1951-01-09    PHYSICAL THERAPY DISCHARGE SUMMARY  Visits from Start of Care: 3  Current functional level related to goals / functional outcomes: See above   Remaining deficits: See above   Education / Equipment: HEP, DN  Plan: Patient agrees to discharge.  Patient goals were met. Patient is being discharged due to being pleased with the current functional level.  ?????     Laureen Abrahams, PT, DPT 06/16/20 11:10 AM  [facility] Physical Therapy [address] [place], [place], [address] Phone: [phone]   Fax:  [phone]

## 2020-04-09 ENCOUNTER — Telehealth: Payer: Self-pay | Admitting: Orthopaedic Surgery

## 2020-04-09 NOTE — Telephone Encounter (Signed)
I called to schedule MRI review with PW. Patient is having a COVID test 10/28 for surgery Monday. He will not be able to come in to go over the MRI. Could PW call with review?

## 2020-04-09 NOTE — Pre-Procedure Instructions (Signed)
[place], [place] - [address] [facility]. [address] [place] [address] Phone: [phone] Fax: [place], [place] [address] [place] [address] Phone: [phone] Fax: [phone]    Your procedure is scheduled on Mon., Nov. 1, 2021 from 7:30AM-11:09AM  Report to [facility] Entrance "A" at 5:30AM  Call this number if you have problems the morning of surgery:  [phone]   Remember:  Do not eat or drink after midnight on Oct. 31st    Take these medicines the morning of surgery with A SIP OF WATER: None  As of today, STOP taking all Aspirin (unless instructed by your doctor) and Other Aspirin containing products, Vitamins, Fish oils, and Herbal medications. Also stop all NSAIDS i.e. Advil, Ibuprofen, Motrin, Aleve, Anaprox, Naproxen, BC, Goody Powders, and all Supplements.   No Smoking of any kind, Tobacco/Vaping, or Alcohol products 24 hours prior to your procedure. If you use a Cpap at night, you may bring all equipment for your overnight stay.     Special instructions:   [place]- Preparing For Surgery  Before surgery, you can play an important role. Because skin is not sterile, your skin needs to be as free of germs as possible. You can reduce the number of germs on your skin by washing with CHG (chlorahexidine gluconate) Soap before surgery.  CHG is an antiseptic cleaner which kills germs and bonds with the skin to continue killing germs even after washing.    Please do not use if you have an allergy to CHG or antibacterial soaps. If your skin becomes reddened/irritated stop using the CHG.  Do not shave (including legs and underarms) for at least 48 hours prior to first CHG shower. It is OK to shave your face.  Please follow these instructions carefully.   1. Shower the NIGHT BEFORE SURGERY and the MORNING OF SURGERY with CHG.    2. If you chose to wash your hair, wash your hair first as usual with your normal shampoo.  3. After you shampoo, rinse your hair and body thoroughly to remove the shampoo.  4. Use CHG as you would any other liquid soap. You can apply CHG directly to the skin and wash gently with a scrungie or a clean washcloth.   5. Apply the CHG Soap to your body ONLY FROM THE NECK DOWN.  Do not use on open wounds or open sores. Avoid contact with your eyes, ears, mouth and genitals (private parts). Wash Face and genitals (private parts)  with your normal soap.  6. Wash thoroughly, paying special attention to the area where your surgery will be performed.  7. Thoroughly rinse your body with warm water from the neck down.  8. DO NOT shower/wash with your normal soap after using and rinsing off the CHG Soap.  9. Pat yourself dry with a CLEAN TOWEL.  10. Wear CLEAN PAJAMAS to bed the night before surgery, wear comfortable clothes the morning of surgery  11. Place CLEAN SHEETS on your bed the night of your first shower and DO NOT SLEEP WITH PETS.   Day of Surgery:            Remember to brush your teeth WITH YOUR REGULAR TOOTHPASTE.  Do not wear jewelry.  Do not wear lotions, powders, colognes, or deodorant.  Do not shave 48 hours prior to surgery.  Men may shave face and neck.  Do not  bring valuables to the hospital.  [facility] is not responsible for any belongings or valuables.  Contacts, dentures or bridgework may not be worn into surgery.    For patients admitted to the hospital, discharge time will be determined by your treatment team.  Patients discharged the day of surgery will not be allowed to drive home, and someone age 46 and over needs to stay with them for 24 hours.  Please wear clean clothes to the hospital/surgery center.    Please read over the following fact sheets that you were given.

## 2020-04-09 NOTE — Telephone Encounter (Signed)
Called results.

## 2020-04-09 NOTE — Telephone Encounter (Signed)
Please call with results

## 2020-04-10 ENCOUNTER — Encounter (HOSPITAL_COMMUNITY): Payer: Self-pay

## 2020-04-10 ENCOUNTER — Encounter (HOSPITAL_COMMUNITY)
Admission: RE | Admit: 2020-04-10 | Discharge: 2020-04-10 | Disposition: A | Discharge status: Home / Self Care | Payer: 59 | Source: Ambulatory Visit | Attending: Neurosurgery | Admitting: Neurosurgery

## 2020-04-10 ENCOUNTER — Other Ambulatory Visit: Payer: Self-pay

## 2020-04-10 ENCOUNTER — Other Ambulatory Visit (HOSPITAL_COMMUNITY)
Admission: RE | Admit: 2020-04-10 | Discharge: 2020-04-10 | Disposition: A | Discharge status: Home / Self Care | Payer: 59 | Source: Ambulatory Visit | Attending: Neurosurgery | Admitting: Neurosurgery

## 2020-04-10 ENCOUNTER — Ambulatory Visit: Payer: 59 | Admitting: Orthopaedic Surgery

## 2020-04-10 DIAGNOSIS — Z7901 Long term (current) use of anticoagulants: Secondary | ICD-10-CM | POA: Insufficient documentation

## 2020-04-10 DIAGNOSIS — Z86718 Personal history of other venous thrombosis and embolism: Secondary | ICD-10-CM | POA: Insufficient documentation

## 2020-04-10 DIAGNOSIS — Z01818 Encounter for other preprocedural examination: Secondary | ICD-10-CM | POA: Diagnosis not present

## 2020-04-10 DIAGNOSIS — Z6830 Body mass index (BMI) 30.0-30.9, adult: Secondary | ICD-10-CM | POA: Insufficient documentation

## 2020-04-10 DIAGNOSIS — M4722 Other spondylosis with radiculopathy, cervical region: Secondary | ICD-10-CM | POA: Diagnosis not present

## 2020-04-10 DIAGNOSIS — E669 Obesity, unspecified: Secondary | ICD-10-CM | POA: Diagnosis not present

## 2020-04-10 DIAGNOSIS — Z01812 Encounter for preprocedural laboratory examination: Secondary | ICD-10-CM | POA: Insufficient documentation

## 2020-04-10 DIAGNOSIS — Z20822 Contact with and (suspected) exposure to covid-19: Secondary | ICD-10-CM | POA: Insufficient documentation

## 2020-04-10 DIAGNOSIS — G4733 Obstructive sleep apnea (adult) (pediatric): Secondary | ICD-10-CM | POA: Insufficient documentation

## 2020-04-10 DIAGNOSIS — I10 Essential (primary) hypertension: Secondary | ICD-10-CM | POA: Diagnosis not present

## 2020-04-10 DIAGNOSIS — M4712 Other spondylosis with myelopathy, cervical region: Secondary | ICD-10-CM | POA: Insufficient documentation

## 2020-04-10 HISTORY — DX: Pneumonia, unspecified organism: J18.9

## 2020-04-10 LAB — SARS CORONAVIRUS 2 (TAT 6-24 HRS): SARS Coronavirus 2: NEGATIVE

## 2020-04-10 LAB — CBC
HCT: 42.1 % (ref 39.0–52.0)
Hemoglobin: 14 g/dL (ref 13.0–17.0)
MCH: 32.6 pg (ref 26.0–34.0)
MCHC: 33.3 g/dL (ref 30.0–36.0)
MCV: 98.1 fL (ref 80.0–100.0)
Platelets: 194 10*3/uL (ref 150–400)
RBC: 4.29 MIL/uL (ref 4.22–5.81)
RDW: 12.2 % (ref 11.5–15.5)
WBC: 4.7 10*3/uL (ref 4.0–10.5)
nRBC: 0 % (ref 0.0–0.2)

## 2020-04-10 LAB — TYPE AND SCREEN
ABO/RH(D): A POS
Antibody Screen: NEGATIVE

## 2020-04-10 LAB — BASIC METABOLIC PANEL
Anion gap: 12 (ref 5–15)
BUN: 25 mg/dL — ABNORMAL HIGH (ref 8–23)
CO2: 22 mmol/L (ref 22–32)
Calcium: 9.2 mg/dL (ref 8.9–10.3)
Chloride: 105 mmol/L (ref 98–111)
Creatinine, Ser: 1 mg/dL (ref 0.61–1.24)
GFR, Estimated: >60 mL/min (ref >60)
Glucose, Bld: 136 mg/dL — ABNORMAL HIGH (ref 70–99)
Potassium: 3.9 mmol/L (ref 3.5–5.1)
Sodium: 139 mmol/L (ref 135–145)

## 2020-04-10 LAB — SURGICAL PCR SCREEN
MRSA, PCR: NEGATIVE
Staphylococcus aureus: NEGATIVE

## 2020-04-10 NOTE — Progress Notes (Signed)
PCP - Dr. Eunice Blase Cardiologist - Dr. Einar Gip; has not seen since 2017-cleared for surgery then and did not require any other follow up.   Chest x-ray - N/A EKG - 04/10/20 Stress Test - 05/20/16-low risk study; no f/u needed. ECHO - denies Cardiac Cath - denies  Sleep Study - OSA+ CPAP - uses nightly; setting is 13.  Blood Thinner Instructions:N/A Aspirin Instructions:N/A  ERAS Protcol -N/A PRE-SURGERY Ensure or G2- N/A  COVID TEST- 04/10/20   Anesthesia review: No  Patient denies shortness of breath, fever, cough and chest pain at PAT appointment   All instructions explained to the patient, with a verbal understanding of the material. Patient agrees to go over the instructions while at home for a better understanding. Patient also instructed to self quarantine after being tested for COVID-19. The opportunity to ask questions was provided.    Coronavirus Screening  Have you experienced the following symptoms:  Cough yes/no: No Fever (>100.3F)  yes/no: No Runny nose yes/no: No Sore throat yes/no: No Difficulty breathing/shortness of breath  yes/no: No  Have you or a family member traveled in the last 14 days and where? yes/no: No   If the patient indicates "YES" to the above questions, their PAT will be rescheduled to limit the exposure to others and, the surgeon will be notified. THE PATIENT WILL NEED TO BE ASYMPTOMATIC FOR 14 DAYS.   If the patient is not experiencing any of these symptoms, the PAT nurse will instruct them to NOT bring anyone with them to their appointment since they may have these symptoms or traveled as well.   Please remind your patients and families that hospital visitation restrictions are in effect and the importance of the restrictions.

## 2020-04-11 NOTE — Progress Notes (Signed)
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  History includes never smoker, THN, OSA (CPAP), murmur (no murmur documented at his 05/17/16 preoperative cardiology evaluation prior to right TKA; no murmur documented 01/08/19 annual PCP exam), TKA (right 06/01/16; left 07/25/18), DVT (post-knee arthroscopy 2004), right check seborrheic keratosis lesion & left neck sebaceous cyst excisions (03/23/19), cc-spine surgery (1990). BMI is consistent with obesity.   Preoperative COVID-19 test negative on 04/10/2020.  Anesthesia team to evaluate on the day of surgery.   VS: BP 140/65   Pulse (!) 58   Temp 36.8 C (Oral)   Resp 18   Ht 5\' 9"  (1.753 m)   Wt 93 kg   SpO2 99%   BMI 30.27 kg/m    PROVIDERS: Hilts, Michael, MD is PCP  - He is not routinely followed by cardiology, but had a preoperative cardiac evaluation by Neldon Labella, NP/Ganji, Ulice Dash, MD in 2017 and had a non-ischemic stress test.    LABS: Labs reviewed: Acceptable for surgery. (all labs ordered are listed, but only abnormal results are displayed)  Labs Reviewed  BASIC METABOLIC PANEL - Abnormal; Notable for the following components:      Result Value   Glucose, Bld 136 (*)    BUN 25 (*)    All other components within normal limits  SURGICAL PCR SCREEN  CBC  TYPE AND SCREEN     EKG: 04/10/20: Sinus bradycardia at 53 bpm Cannot rule out Anterior infarct , age undetermined Abnormal ECG since last tracing no significant change Confirmed by Larae Grooms [phone]) on 04/10/2020 11:42:43 AM   CV: Nuclear stress test  05/20/16 [facility]; scanned under Media tab 06/01/16):   Impression: 1.  The resting ECG demonstrates NSR, normal resting conduction and no resting arrhythmias.  Stress ECG is nondiagnostic for ischemia as it is a pharmacologic stress using Lexiscan.  Stress symptoms included dyspnea. 2.  Myocardial perfusion imaging is normal. Overall LV systolic function was normal without regional wall motion abnormalities. LVEF 69%. This is a normal/low risk study.   Past Medical History:  Diagnosis Date  . Arthritis    back & L knee, R foot - lis franc   . Deep vein thrombosis ([place])    after knee surgery in 2004  . Hypertension   . Murmur, cardiac    pt. reports its difficult to auscultate   . Pneumonia    back in college days.   . Sleep apnea    CPAP- last study- 2014    Past Surgical History:  Procedure Laterality Date  . CERVICAL SPINE SURGERY N/A 1990   C5-6 discectomy  . FINGER SURGERY Right 2008   x 3 index finger  . KNEE ARTHROPLASTY    . KNEE ARTHROSCOPY Bilateral    L 2004, R 2005  . MASS EXCISION Right 01/18/2017   Procedure: EXCISION BENIGN RIGHT NECK MASS;  Surgeon: Rozetta Nunnery, MD;  Location: [place];  Service: ENT;  Laterality: Right;  . MASS EXCISION Bilateral 03/23/2019   Procedure: EXCISIONS OF RIGHT CHEEK LESION, RIGHT POSTERIOR NECK LIPOMA  AND LEFT NECK NODULE;  Surgeon: Rozetta Nunnery, MD;  Location: [place];  Service: ENT;  Laterality: Bilateral;  . NASAL FRACTURE SURGERY    . 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 TOTAL KNEE ARTHROPLASTY Right 06/01/2016   Procedure: TOTAL KNEE ARTHROPLASTY;  Surgeon: [name], MD;  Location: [place];  Service: Orthopedics;  Laterality: Right;  . TOTAL KNEE ARTHROPLASTY Left 07/25/2018   Procedure: LEFT TOTAL KNEE ARTHROPLASTY;  Surgeon: [name], MD;  Location: [place];  Service: Orthopedics;  Laterality: Left;    MEDICATIONS: . Acetylcysteine  (N-ACETYL-L-CYSTEINE PO)  . amoxicillin (AMOXIL) 500 MG capsule  . Ascorbic Acid (VITAMIN C) 1000 MG tablet  . B Complex-C (B-COMPLEX WITH VITAMIN C) tablet  . Cholecalciferol (VITAMIN D-3) 5000 units TABS  . Cyanocobalamin (VITAMIN B-12) 5000 MCG TBDP  . DHEA 50 MG CAPS  . Diindolylmethane POWD  . fluticasone (FLONASE) 50 MCG/ACT nasal spray  . Krill Oil 500 MG CAPS  . losartan-hydrochlorothiazide (HYZAAR) 100-25 MG tablet  . Lysine HCl 500 MG TABS  . Magnesium 400 MG TABS  . Menaquinone-7 (VITAMIN K2) 40 MCG TABS  . PRESCRIPTION MEDICATION  . QUERCETIN PO  . saw palmetto 160 MG capsule   No current facility-administered medications for this encounter.    Myra Gianotti, PA-C Surgical Short Stay/Anesthesiology [facility] Phone [phone] [facility] Phone [phone] 04/11/2020 10:05 AM

## 2020-04-11 NOTE — Anesthesia Preprocedure Evaluation (Addendum)
Anesthesia Evaluation  Patient identified by MRN, date of birth, ID band Patient awake    Reviewed: Allergy & Precautions, NPO status , Patient's Chart, lab work & pertinent test results  History of Anesthesia Complications Negative for: history of anesthetic complications  Airway Mallampati: II  TM Distance: >3 FB Neck ROM: Full    Dental  (+) Missing,    Pulmonary sleep apnea and Continuous Positive Airway Pressure Ventilation ,    Pulmonary exam normal        Cardiovascular hypertension, + DVT (2004, postsurgical)  Normal cardiovascular exam     Neuro/Psych negative psych ROS   GI/Hepatic negative GI ROS, Neg liver ROS,   Endo/Other  negative endocrine ROS  Renal/GU negative Renal ROS  negative genitourinary   Musculoskeletal  (+) Arthritis ,   Abdominal   Peds  Hematology negative hematology ROS (+)   Anesthesia Other Findings   Reproductive/Obstetrics                           Anesthesia Physical Anesthesia Plan  ASA: II  Anesthesia Plan: General   Post-op Pain Management:    Induction: Intravenous  PONV Risk Score and Plan: 2 and Ondansetron, Dexamethasone, Treatment may vary due to age or medical condition and Midazolam  Airway Management Planned: Oral ETT and Video Laryngoscope Planned  Additional Equipment: None  Intra-op Plan:   Post-operative Plan: Extubation in OR  Informed Consent: I have reviewed the patients History and Physical, chart, labs and discussed the procedure including the risks, benefits and alternatives for the proposed anesthesia with the patient or authorized representative who has indicated his/her understanding and acceptance.     Dental advisory given  Plan Discussed with:   Anesthesia Plan Comments: (PAT note written 04/11/2020 by Myra Gianotti, PA-C. )      Anesthesia Quick Evaluation

## 2020-04-14 ENCOUNTER — Inpatient Hospital Stay (HOSPITAL_COMMUNITY): Payer: 59 | Admitting: Vascular Surgery

## 2020-04-14 ENCOUNTER — Inpatient Hospital Stay (HOSPITAL_COMMUNITY)
Admission: RE | Disposition: A | Discharge status: Home / Self Care | Payer: Self-pay | Source: Home / Self Care | Attending: Neurosurgery

## 2020-04-14 ENCOUNTER — Inpatient Hospital Stay (HOSPITAL_COMMUNITY): Payer: 59

## 2020-04-14 ENCOUNTER — Inpatient Hospital Stay (HOSPITAL_COMMUNITY): Payer: 59 | Admitting: Certified Registered"

## 2020-04-14 ENCOUNTER — Inpatient Hospital Stay (HOSPITAL_COMMUNITY)
Admission: RE | Admit: 2020-04-14 | Discharge: 2020-04-15 | DRG: 472 | Disposition: A | Discharge status: Home / Self Care | Payer: 59 | Attending: Neurosurgery | Admitting: Neurosurgery

## 2020-04-14 DIAGNOSIS — M4712 Other spondylosis with myelopathy, cervical region: Principal | ICD-10-CM | POA: Diagnosis present

## 2020-04-14 DIAGNOSIS — Z419 Encounter for procedure for purposes other than remedying health state, unspecified: Secondary | ICD-10-CM

## 2020-04-14 DIAGNOSIS — M50123 Cervical disc disorder at C6-C7 level with radiculopathy: Secondary | ICD-10-CM | POA: Diagnosis present

## 2020-04-14 DIAGNOSIS — Z96653 Presence of artificial knee joint, bilateral: Secondary | ICD-10-CM | POA: Diagnosis present

## 2020-04-14 DIAGNOSIS — Z981 Arthrodesis status: Secondary | ICD-10-CM | POA: Diagnosis not present

## 2020-04-14 DIAGNOSIS — I10 Essential (primary) hypertension: Secondary | ICD-10-CM | POA: Diagnosis present

## 2020-04-14 DIAGNOSIS — M4322 Fusion of spine, cervical region: Secondary | ICD-10-CM | POA: Diagnosis not present

## 2020-04-14 DIAGNOSIS — Z86718 Personal history of other venous thrombosis and embolism: Secondary | ICD-10-CM | POA: Diagnosis not present

## 2020-04-14 DIAGNOSIS — Z820 Family history of epilepsy and other diseases of the nervous system: Secondary | ICD-10-CM

## 2020-04-14 DIAGNOSIS — M4802 Spinal stenosis, cervical region: Secondary | ICD-10-CM | POA: Diagnosis not present

## 2020-04-14 DIAGNOSIS — M1990 Unspecified osteoarthritis, unspecified site: Secondary | ICD-10-CM | POA: Diagnosis not present

## 2020-04-14 DIAGNOSIS — M4722 Other spondylosis with radiculopathy, cervical region: Secondary | ICD-10-CM | POA: Diagnosis not present

## 2020-04-14 DIAGNOSIS — G4730 Sleep apnea, unspecified: Secondary | ICD-10-CM | POA: Diagnosis present

## 2020-04-14 DIAGNOSIS — M5001 Cervical disc disorder with myelopathy,  high cervical region: Secondary | ICD-10-CM | POA: Diagnosis not present

## 2020-04-14 DIAGNOSIS — Z79899 Other long term (current) drug therapy: Secondary | ICD-10-CM

## 2020-04-14 DIAGNOSIS — M50023 Cervical disc disorder at C6-C7 level with myelopathy: Secondary | ICD-10-CM | POA: Diagnosis not present

## 2020-04-14 HISTORY — PX: ANTERIOR CERVICAL DECOMP/DISCECTOMY FUSION: SHX1161

## 2020-04-14 SURGERY — ANTERIOR CERVICAL DECOMPRESSION/DISCECTOMY FUSION 3 LEVELS
Anesthesia: General | Site: Spine Cervical

## 2020-04-14 MED ORDER — ROCURONIUM BROMIDE 10 MG/ML (PF) SYRINGE
PREFILLED_SYRINGE | INTRAVENOUS | Status: DC | PRN
  Administered 2020-04-14: 40 mg via INTRAVENOUS
  Administered 2020-04-14: 60 mg via INTRAVENOUS

## 2020-04-14 MED ORDER — ONDANSETRON HCL 4 MG/2ML IJ SOLN
INTRAMUSCULAR | Status: DC | PRN
  Administered 2020-04-14: 4 mg via INTRAVENOUS

## 2020-04-14 MED ORDER — MIDAZOLAM HCL 2 MG/2ML IJ SOLN
INTRAMUSCULAR | Status: DC | PRN
  Administered 2020-04-14: 2 mg via INTRAVENOUS

## 2020-04-14 MED ORDER — ALUM & MAG HYDROXIDE-SIMETH 200-200-20 MG/5ML PO SUSP: 30.0000 mL | Freq: Four times a day (QID) | ORAL | Status: DC | PRN

## 2020-04-14 MED ORDER — MAGNESIUM OXIDE 400 (241.3 MG) MG PO TABS
400.0000 mg | ORAL_TABLET | Freq: Every day | ORAL | Status: DC
  Administered 2020-04-14: 400 mg via ORAL
  Filled 2020-04-14: qty 1

## 2020-04-14 MED ORDER — LIDOCAINE 2% (20 MG/ML) 5 ML SYRINGE
INTRAMUSCULAR | Status: DC | PRN
  Administered 2020-04-14: 100 mg via INTRAVENOUS

## 2020-04-14 MED ORDER — CEFAZOLIN SODIUM-DEXTROSE 2-4 GM/100ML-% IV SOLN
2.0000 g | INTRAVENOUS | Status: AC
  Administered 2020-04-14: 2 g via INTRAVENOUS
  Filled 2020-04-14: qty 100

## 2020-04-14 MED ORDER — MORPHINE SULFATE (PF) 4 MG/ML IV SOLN
4.0000 mg | INTRAVENOUS | Status: DC | PRN
  Filled 2020-04-14: qty 1

## 2020-04-14 MED ORDER — CHLORHEXIDINE GLUCONATE 0.12 % MT SOLN
15.0000 mL | Freq: Once | OROMUCOSAL | Status: AC
  Administered 2020-04-14: 15 mL via OROMUCOSAL
  Filled 2020-04-14: qty 15

## 2020-04-14 MED ORDER — DEXAMETHASONE SODIUM PHOSPHATE 4 MG/ML IJ SOLN: 4.0000 mg | Freq: Four times a day (QID) | INTRAMUSCULAR | Status: AC

## 2020-04-14 MED ORDER — BUPIVACAINE-EPINEPHRINE (PF) 0.5% -1:200000 IJ SOLN
INTRAMUSCULAR | Status: DC | PRN
  Administered 2020-04-14: 10 mL

## 2020-04-14 MED ORDER — CEFAZOLIN SODIUM-DEXTROSE 2-4 GM/100ML-% IV SOLN
2.0000 g | Freq: Three times a day (TID) | INTRAVENOUS | Status: AC
  Administered 2020-04-14 (×2): 2 g via INTRAVENOUS
  Filled 2020-04-14 (×2): qty 100

## 2020-04-14 MED ORDER — DEXAMETHASONE SODIUM PHOSPHATE 10 MG/ML IJ SOLN
INTRAMUSCULAR | Status: AC
  Filled 2020-04-14: qty 1

## 2020-04-14 MED ORDER — ACETAMINOPHEN 325 MG PO TABS
650.0000 mg | ORAL_TABLET | ORAL | Status: DC | PRN
  Administered 2020-04-14 – 2020-04-15 (×2): 650 mg via ORAL
  Filled 2020-04-14 (×2): qty 2

## 2020-04-14 MED ORDER — AMISULPRIDE (ANTIEMETIC) 5 MG/2ML IV SOLN: 10.0000 mg | Freq: Once | INTRAVENOUS | Status: DC | PRN

## 2020-04-14 MED ORDER — HYDROMORPHONE HCL 1 MG/ML IJ SOLN
INTRAMUSCULAR | Status: AC
  Filled 2020-04-14: qty 1

## 2020-04-14 MED ORDER — ONDANSETRON HCL 4 MG/2ML IJ SOLN: 4.0000 mg | Freq: Once | INTRAMUSCULAR | Status: DC | PRN

## 2020-04-14 MED ORDER — LOSARTAN POTASSIUM 50 MG PO TABS
100.0000 mg | ORAL_TABLET | Freq: Every day | ORAL | Status: DC
  Administered 2020-04-14 – 2020-04-15 (×2): 100 mg via ORAL
  Filled 2020-04-14 (×2): qty 2

## 2020-04-14 MED ORDER — FLUTICASONE PROPIONATE 50 MCG/ACT NA SUSP
1.0000 | Freq: Every evening | NASAL | Status: DC | PRN
  Filled 2020-04-14: qty 16

## 2020-04-14 MED ORDER — DEXAMETHASONE SODIUM PHOSPHATE 10 MG/ML IJ SOLN
INTRAMUSCULAR | Status: DC | PRN
  Administered 2020-04-14: 10 mg via INTRAVENOUS

## 2020-04-14 MED ORDER — MIDAZOLAM HCL 2 MG/2ML IJ SOLN
INTRAMUSCULAR | Status: AC
  Filled 2020-04-14: qty 2

## 2020-04-14 MED ORDER — ORAL CARE MOUTH RINSE: 15.0000 mL | Freq: Once | OROMUCOSAL | Status: AC

## 2020-04-14 MED ORDER — OXYCODONE HCL 5 MG PO TABS: 5.0000 mg | ORAL_TABLET | ORAL | Status: DC | PRN

## 2020-04-14 MED ORDER — HYDROMORPHONE HCL 1 MG/ML IJ SOLN
INTRAMUSCULAR | Status: AC
  Administered 2020-04-14: 0.5 mg via INTRAVENOUS
  Filled 2020-04-14: qty 1

## 2020-04-14 MED ORDER — CHLORHEXIDINE GLUCONATE CLOTH 2 % EX PADS: 6.0000 | MEDICATED_PAD | Freq: Once | CUTANEOUS | Status: DC

## 2020-04-14 MED ORDER — MENTHOL 3 MG MT LOZG
1.0000 | LOZENGE | OROMUCOSAL | Status: DC | PRN
  Administered 2020-04-14: 3 mg via ORAL
  Filled 2020-04-14: qty 9

## 2020-04-14 MED ORDER — SUGAMMADEX SODIUM 200 MG/2ML IV SOLN
INTRAVENOUS | Status: DC | PRN
  Administered 2020-04-14: 200 mg via INTRAVENOUS

## 2020-04-14 MED ORDER — OXYCODONE HCL 5 MG/5ML PO SOLN: 5.0000 mg | Freq: Once | ORAL | Status: AC | PRN

## 2020-04-14 MED ORDER — BISACODYL 10 MG RE SUPP: 10.0000 mg | Freq: Every day | RECTAL | Status: DC | PRN

## 2020-04-14 MED ORDER — FENTANYL CITRATE (PF) 250 MCG/5ML IJ SOLN
INTRAMUSCULAR | Status: AC
  Filled 2020-04-14: qty 5

## 2020-04-14 MED ORDER — HYDROCHLOROTHIAZIDE 25 MG PO TABS
25.0000 mg | ORAL_TABLET | Freq: Every day | ORAL | Status: DC
  Administered 2020-04-14 – 2020-04-15 (×2): 25 mg via ORAL
  Filled 2020-04-14 (×2): qty 1

## 2020-04-14 MED ORDER — ACETAMINOPHEN 500 MG PO TABS
1000.0000 mg | ORAL_TABLET | Freq: Four times a day (QID) | ORAL | Status: AC
  Administered 2020-04-14 – 2020-04-15 (×3): 1000 mg via ORAL
  Filled 2020-04-14 (×4): qty 2

## 2020-04-14 MED ORDER — LACTATED RINGERS IV SOLN: INTRAVENOUS | Status: DC

## 2020-04-14 MED ORDER — FENTANYL CITRATE (PF) 250 MCG/5ML IJ SOLN
INTRAMUSCULAR | Status: DC | PRN
  Administered 2020-04-14 (×4): 50 ug via INTRAVENOUS
  Administered 2020-04-14: 100 ug via INTRAVENOUS
  Administered 2020-04-14 (×5): 50 ug via INTRAVENOUS

## 2020-04-14 MED ORDER — DOCUSATE SODIUM 100 MG PO CAPS
100.0000 mg | ORAL_CAPSULE | Freq: Two times a day (BID) | ORAL | Status: DC
  Administered 2020-04-14 – 2020-04-15 (×3): 100 mg via ORAL
  Filled 2020-04-14 (×3): qty 1

## 2020-04-14 MED ORDER — ACETAMINOPHEN 650 MG RE SUPP: 650.0000 mg | RECTAL | Status: DC | PRN

## 2020-04-14 MED ORDER — THROMBIN 5000 UNITS EX SOLR
CUTANEOUS | Status: AC
  Filled 2020-04-14: qty 5000

## 2020-04-14 MED ORDER — BACITRACIN ZINC 500 UNIT/GM EX OINT
TOPICAL_OINTMENT | CUTANEOUS | Status: DC | PRN
  Administered 2020-04-14: 1 via TOPICAL

## 2020-04-14 MED ORDER — HYDROMORPHONE HCL 1 MG/ML IJ SOLN
0.2500 mg | INTRAMUSCULAR | Status: DC | PRN
  Administered 2020-04-14 (×2): 0.5 mg via INTRAVENOUS

## 2020-04-14 MED ORDER — OXYCODONE HCL 5 MG PO TABS
5.0000 mg | ORAL_TABLET | Freq: Once | ORAL | Status: AC | PRN
  Administered 2020-04-14: 5 mg via ORAL

## 2020-04-14 MED ORDER — OXYCODONE HCL 5 MG PO TABS
ORAL_TABLET | ORAL | Status: AC
  Filled 2020-04-14: qty 1

## 2020-04-14 MED ORDER — ONDANSETRON HCL 4 MG/2ML IJ SOLN: 4.0000 mg | Freq: Four times a day (QID) | INTRAMUSCULAR | Status: DC | PRN

## 2020-04-14 MED ORDER — LIDOCAINE 2% (20 MG/ML) 5 ML SYRINGE
INTRAMUSCULAR | Status: AC
  Filled 2020-04-14: qty 5

## 2020-04-14 MED ORDER — CYCLOBENZAPRINE HCL 10 MG PO TABS
10.0000 mg | ORAL_TABLET | Freq: Three times a day (TID) | ORAL | Status: DC | PRN
  Administered 2020-04-14: 10 mg via ORAL
  Filled 2020-04-14: qty 1

## 2020-04-14 MED ORDER — DEXAMETHASONE 4 MG PO TABS
4.0000 mg | ORAL_TABLET | Freq: Four times a day (QID) | ORAL | Status: AC
  Administered 2020-04-14 (×2): 4 mg via ORAL
  Filled 2020-04-14 (×2): qty 1

## 2020-04-14 MED ORDER — ROCURONIUM BROMIDE 10 MG/ML (PF) SYRINGE
PREFILLED_SYRINGE | INTRAVENOUS | Status: AC
  Filled 2020-04-14: qty 10

## 2020-04-14 MED ORDER — LOSARTAN POTASSIUM-HCTZ 100-25 MG PO TABS: 1.0000 | ORAL_TABLET | Freq: Every day | ORAL | Status: DC

## 2020-04-14 MED ORDER — PROPOFOL 10 MG/ML IV BOLUS
INTRAVENOUS | Status: AC
  Filled 2020-04-14: qty 20

## 2020-04-14 MED ORDER — ONDANSETRON HCL 4 MG/2ML IJ SOLN
INTRAMUSCULAR | Status: AC
  Filled 2020-04-14: qty 2

## 2020-04-14 MED ORDER — OXYCODONE HCL 5 MG PO TABS
10.0000 mg | ORAL_TABLET | ORAL | Status: DC | PRN
  Administered 2020-04-14 – 2020-04-15 (×6): 10 mg via ORAL
  Filled 2020-04-14 (×6): qty 2

## 2020-04-14 MED ORDER — BUPIVACAINE-EPINEPHRINE 0.5% -1:200000 IJ SOLN
INTRAMUSCULAR | Status: AC
  Filled 2020-04-14: qty 1

## 2020-04-14 MED ORDER — THROMBIN 5000 UNITS EX SOLR
OROMUCOSAL | Status: DC | PRN
  Administered 2020-04-14 (×2): 5 mL via TOPICAL

## 2020-04-14 MED ORDER — BACITRACIN ZINC 500 UNIT/GM EX OINT
TOPICAL_OINTMENT | CUTANEOUS | Status: AC
  Filled 2020-04-14: qty 28.35

## 2020-04-14 MED ORDER — PHENOL 1.4 % MT LIQD
1.0000 | OROMUCOSAL | Status: DC | PRN
  Administered 2020-04-14: 1 via OROMUCOSAL
  Filled 2020-04-14: qty 177

## 2020-04-14 MED ORDER — PROPOFOL 10 MG/ML IV BOLUS
INTRAVENOUS | Status: DC | PRN
  Administered 2020-04-14: 150 mg via INTRAVENOUS

## 2020-04-14 MED ORDER — 0.9 % SODIUM CHLORIDE (POUR BTL) OPTIME
TOPICAL | Status: DC | PRN
  Administered 2020-04-14: 1000 mL

## 2020-04-14 MED ORDER — PANTOPRAZOLE SODIUM 40 MG IV SOLR
40.0000 mg | Freq: Every day | INTRAVENOUS | Status: DC
  Administered 2020-04-14: 40 mg via INTRAVENOUS
  Filled 2020-04-14: qty 40

## 2020-04-14 MED ORDER — ONDANSETRON HCL 4 MG PO TABS: 4.0000 mg | ORAL_TABLET | Freq: Four times a day (QID) | ORAL | Status: DC | PRN

## 2020-04-14 SURGICAL SUPPLY — 69 items
APL SKNCLS STERI-STRIP NONHPOA (GAUZE/BANDAGES/DRESSINGS) ×1
BAND INSRT 18 STRL LF DISP RB (MISCELLANEOUS) ×2
BAND RUBBER #18 3X1/16 STRL (MISCELLANEOUS) ×2 IMPLANT
BENZOIN TINCTURE PRP APPL 2/3 (GAUZE/BANDAGES/DRESSINGS) ×3 IMPLANT
BIT DRILL NEURO 2X3.1 SFT TUCH (MISCELLANEOUS) ×1 IMPLANT
BLADE SURG 15 STRL LF DISP TIS (BLADE) ×1 IMPLANT
BLADE SURG 15 STRL SS (BLADE) ×6
BLADE ULTRA TIP 2M (BLADE) ×2 IMPLANT
BUR BARREL STRAIGHT FLUTE 4.0 (BURR) ×3 IMPLANT
BUR MATCHSTICK NEURO 3.0 LAGG (BURR) ×2 IMPLANT
CANISTER SUCT 3000ML PPV (MISCELLANEOUS) ×2 IMPLANT
CARTRIDGE OIL MAESTRO DRILL (MISCELLANEOUS) ×1 IMPLANT
COVER MAYO STAND STRL (DRAPES) ×2 IMPLANT
COVER WAND RF STERILE (DRAPES) ×1 IMPLANT
DEVICE FUSION VIST S 14X14X6MM (Trauma) IMPLANT
DIFFUSER DRILL AIR PNEUMATIC (MISCELLANEOUS) ×2 IMPLANT
DRAIN JACKSON PRATT 10MM FLAT (MISCELLANEOUS) ×1 IMPLANT
DRAPE LAPAROTOMY 100X72 PEDS (DRAPES) ×2 IMPLANT
DRAPE MICROSCOPE LEICA (MISCELLANEOUS) ×1 IMPLANT
DRAPE SURG 17X23 STRL (DRAPES) ×4 IMPLANT
DRILL NEURO 2X3.1 SOFT TOUCH (MISCELLANEOUS) ×2
DRSG OPSITE POSTOP 3X4 (GAUZE/BANDAGES/DRESSINGS) ×1 IMPLANT
DRSG OPSITE POSTOP 4X6 (GAUZE/BANDAGES/DRESSINGS) ×1 IMPLANT
ELECT BLADE 4.0 EZ CLEAN MEGAD (MISCELLANEOUS) ×2
ELECT REM PT RETURN 9FT ADLT (ELECTROSURGICAL) ×2
ELECTRODE BLDE 4.0 EZ CLN MEGD (MISCELLANEOUS) IMPLANT
ELECTRODE REM PT RTRN 9FT ADLT (ELECTROSURGICAL) ×1 IMPLANT
EVACUATOR SILICONE 100CC (DRAIN) ×1 IMPLANT
GAUZE 4X4 16PLY RFD (DISPOSABLE) IMPLANT
GLOVE BIO SURGEON STRL SZ 6.5 (GLOVE) ×4 IMPLANT
GLOVE BIO SURGEON STRL SZ8 (GLOVE) ×2 IMPLANT
GLOVE BIO SURGEON STRL SZ8.5 (GLOVE) ×2 IMPLANT
GLOVE BIOGEL PI IND STRL 6.5 (GLOVE) IMPLANT
GLOVE BIOGEL PI INDICATOR 6.5 (GLOVE) ×2
GLOVE EXAM NITRILE XL STR (GLOVE) IMPLANT
GOWN STRL REUS W/ TWL LRG LVL3 (GOWN DISPOSABLE) IMPLANT
GOWN STRL REUS W/ TWL XL LVL3 (GOWN DISPOSABLE) ×1 IMPLANT
GOWN STRL REUS W/TWL LRG LVL3 (GOWN DISPOSABLE) ×4
GOWN STRL REUS W/TWL XL LVL3 (GOWN DISPOSABLE) ×2
HEMOSTAT POWDER KIT SURGIFOAM (HEMOSTASIS) ×3 IMPLANT
KIT BASIN OR (CUSTOM PROCEDURE TRAY) ×2 IMPLANT
KIT TURNOVER KIT B (KITS) ×2 IMPLANT
MARKER SKIN DUAL TIP RULER LAB (MISCELLANEOUS) ×2 IMPLANT
NDL SPNL 18GX3.5 QUINCKE PK (NEEDLE) ×1 IMPLANT
NEEDLE HYPO 22GX1.5 SAFETY (NEEDLE) ×2 IMPLANT
NEEDLE SPNL 18GX3.5 QUINCKE PK (NEEDLE) ×2 IMPLANT
NS IRRIG 1000ML POUR BTL (IV SOLUTION) ×2 IMPLANT
OIL CARTRIDGE MAESTRO DRILL (MISCELLANEOUS) ×2
PACK LAMINECTOMY NEURO (CUSTOM PROCEDURE TRAY) ×2 IMPLANT
PATTIES SURGICAL .5 X.5 (GAUZE/BANDAGES/DRESSINGS) ×1 IMPLANT
PATTIES SURGICAL 1X1 (DISPOSABLE) ×1 IMPLANT
PEEK VISTA 14X14X8MM (Peek) ×2 IMPLANT
PIN DISTRACTION 14MM (PIN) ×4 IMPLANT
PLATE ANT CERV XTEND 1 LV 18 (Plate) ×1 IMPLANT
PLATE ANT CERV XTEND 2 LV 32 (Plate) ×1 IMPLANT
PUTTY DBM 5CC CALC GRAN ×1 IMPLANT
SCREW VAR 4.2 XD SELF DRILL 14 (Screw) ×1 IMPLANT
SCREW XTD VAR 4.2 SELF TAP (Screw) ×9 IMPLANT
SPONGE INTESTINAL PEANUT (DISPOSABLE) ×4 IMPLANT
SPONGE SURGIFOAM ABS GEL 100 (HEMOSTASIS) IMPLANT
STRIP CLOSURE SKIN 1/2X4 (GAUZE/BANDAGES/DRESSINGS) ×2 IMPLANT
SUT VIC AB 0 CT1 27 (SUTURE) ×4
SUT VIC AB 0 CT1 27XBRD ANTBC (SUTURE) ×1 IMPLANT
SUT VIC AB 3-0 SH 8-18 (SUTURE) ×3 IMPLANT
SYR BULB IRRIG 60ML STRL (SYRINGE) ×1 IMPLANT
TOWEL GREEN STERILE (TOWEL DISPOSABLE) ×2 IMPLANT
TOWEL GREEN STERILE FF (TOWEL DISPOSABLE) ×2 IMPLANT
VISTA S O 14X14X6MM (Trauma) ×2 IMPLANT
WATER STERILE IRR 1000ML POUR (IV SOLUTION) ×2 IMPLANT

## 2020-04-14 NOTE — Op Note (Signed)
Brief history: The patient is a 69 year old white male on whom another physician performed a C5-6 anterior cervical discectomy and fusion many years ago.  The patient has developed recurrent neck and left great and right arm pain consistent with cervical radiculopathy.  He has failed medical management and was worked up with a cervical MRI which demonstrated spondylosis and stenosis most prominent at C3-4, C4-5 and C6-7.  I discussed the various treatment options with the patient including surgery.  He has weighed the risk, benefits and alternatives and decided proceed with a C3-4, C4-5 and C6-7 anterior cervicectomy, fusion and plating.  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 2 plates, with globus titanium plate  Surgeon: Dr. Earle Gell  Asst.: Arnetha Massy, NP  Anesthesia: Gen. endotracheal  Estimated blood loss: 125 cc  Drains: One 10 mm flat Jackson-Pratt drain in the prevertebral space  Complications: None  Description of procedure: The patient was brought to the operating room by the anesthesia team. General endotracheal anesthesia was induced. A roll was placed under the patient's shoulders to keep the neck in the neutral position. The patient's anterior cervical region was then prepared with Betadine scrub and Betadine solution. Sterile drapes were applied.  The area to be incised was then injected with Marcaine with epinephrine solution. I then used a scalpel to make a transverse incision in the patient's left anterior neck. I used the  Metzenbaum scissors to dissected through the scar tissue and to divide the platysmal muscle and then to dissect medial to the sternocleidomastoid muscle, jugular vein, and carotid artery. I carefully dissected down towards the anterior cervical spine identifying the esophagus and retracting it medially. Then using Kitner swabs to clear soft tissue from the anterior cervical spine. We then inserted a bent spinal needle into the upper exposed intervertebral disc space. We then obtained intraoperative radiographs confirm our location.  I then used electrocautery to detach the medial border of the longus colli muscle bilaterally from the C3-4, C4-5 and C6-7 intervertebral disc spaces. I then inserted the Caspar self-retaining retractor underneath the longus colli muscle bilaterally to provide exposure.  We then incised the intervertebral disc at C3-4. We then performed a partial intervertebral discectomy with a pituitary forceps and the Karlin curettes. I then inserted distraction screws into the vertebral bodies at C3 and C4. We then distracted the interspace. We then used the high-speed drill to decorticate the vertebral endplates at W5-4, to drill away the remainder of the intervertebral disc, to drill away some posterior spondylosis, and to thin out the posterior longitudinal ligament. I then incised ligament with the arachnoid knife. We then removed the ligament with a Kerrison punches undercutting the vertebral endplates and decompressing the thecal sac. We then performed foraminotomies about the bilateral C4 nerve roots. This completed the decompression at this level.  We then repeat this procedure in analogous fashion at C4-5 decompressing the thecal sac and the bilateral C5 nerve roots.  We then repeated this procedure in analogous fashion at C6-7 decompressing the thecal sac and the bilateral C7 nerve roots.  We could not visualize C6-7 via radiograph so we counted down from the C4-5 disc space.  We  now turned our to attention to the interbody fusion. We used the trial spacers to determine the appropriate size for the interbody prosthesis. We then pre-filled prosthesis with a combination of local morcellized autograft bone that we obtained during decompression as well as Zimmer DBM. We then inserted the prosthesis into the distracted interspace at C3-4, C4-5 and C6-7. We then removed the distraction screws. There was a good snug fit of the prosthesis in the interspace.  Having completed the fusion we now turned attention to the anterior spinal instrumentation. We used the high-speed drill to drill away some anterior spondylosis at the disc spaces so that the plate lay down flat. We selected the appropriate length titanium anterior cervical plate. We laid it along the anterior aspect of the vertebral bodies from C3-C5 and at C6-7, i.e 2 plates. We then drilled 14 mm holes at C3, C4, C5, C6 and C7. We then secured the plate to the vertebral bodies by placing two 14 mm self-tapping screws at C3, C4, C5, C6 and C7. We then obtained intraoperative radiograph. The demonstrating good position of the instrumentation. We therefore secured the screws the plate the locking each cam. This completed the instrumentation.  We then obtained hemostasis using bipolar electrocautery. We irrigated the wound out with bacitracin solution. We then removed the retractor. We inspected the esophagus for any damage. There was none apparent.  We placed a 10 mm flat Jackson-Pratt drain in the prevertebral space and tunneled it out through a separate stab wound.  We then reapproximated patient's platysmal muscle with interrupted 3-0 Vicryl suture. We then reapproximated the subcutaneous tissue with interrupted 3-0 Vicryl suture. The skin was reapproximated with Steri-Strips and benzoin. The wound was then covered with bacitracin ointment. A sterile dressing was applied. The drapes were removed. Patient was subsequently extubated by the  anesthesia team and transported to the post anesthesia care unit in stable condition. All sponge instrument and needle counts were reportedly correct at the end of this case.

## 2020-04-14 NOTE — Progress Notes (Signed)
Subjective: The patient is alert and pleasant.  He looks well.  He is in no apparent distress.  Objective: Vital signs in last 24 hours: Temp:  [97.5 F (36.4 C)-98.9 F (37.2 C)] 98.9 F (37.2 C) (11/01 1252) Pulse Rate:  [58-86] 69 (11/01 1252) Resp:  [13-18] 18 (11/01 1252) BP: (139-158)/(53-67) 154/67 (11/01 1252) SpO2:  [93 %-98 %] 95 % (11/01 1252) Weight:  [90.7 kg] 90.7 kg (11/01 0623) Estimated body mass index is 29.53 kg/m as calculated from the following:   Height as of this encounter: 5\' 9"  (1.753 m).   Weight as of this encounter: 90.7 kg.   Intake/Output from previous day: No intake/output data recorded. Intake/Output this shift: Total I/O In: 2300 [I.V.:2300] Out: 600 [Urine:200; Blood:400]  Physical exam the patient is alert and pleasant.  His dressing is clean and dry.  There is no hematoma or shift.  The patient is moving all 4 extremities well without weakness.  Lab Results: No results for input(s): WBC, HGB, HCT, PLT in the last 72 hours. BMET No results for input(s): NA, K, CL, CO2, GLUCOSE, BUN, CREATININE, CALCIUM in the last 72 hours.  Studies/Results: No results found.  Assessment/Plan: The patient is doing well.  I spoke with his wife.  LOS: 0 days     Ophelia Charter 04/14/2020, 12:54 PM

## 2020-04-14 NOTE — Transfer of Care (Signed)
Immediate Anesthesia Transfer of Care Note  Patient: RAMADAN COUEY  Procedure(s) Performed: ANTERIOR CERVICAL DECOMPRESSION/DISCECTOMY FUSION, INTERBODY PROSTHESIS, PLATE/SCREWS CERVICAL THREE-FOUR,CERVICAL FOUR-FIVE,CERVICAL SIX-SEVEN (N/A Spine Cervical)  Patient Location: PACU  Anesthesia Type:General  Level of Consciousness: drowsy and patient cooperative  Airway & Oxygen Therapy: Patient Spontanous Breathing  Post-op Assessment: Report given to RN and Post -op Vital signs reviewed and stable  Post vital signs: Reviewed and stable  Last Vitals:  Vitals Value Taken Time  BP 158/62 04/14/20 1148  Temp    Pulse 86 04/14/20 1149  Resp 15 04/14/20 1149  SpO2 98 % 04/14/20 1149  Vitals shown include unvalidated device data.  Last Pain:  Vitals:   04/14/20 0635  TempSrc:   PainSc: 3       Patients Stated Pain Goal: 2 (47/53/39 1792)  Complications: No complications documented.

## 2020-04-14 NOTE — H&P (Signed)
Subjective: The patient is a 69 year old white male whose had a previous C5-6 anterior cervicectomy, fusion and plating years ago by another physician.  He has done well until recently when he developed neck and left great and right shoulder and arm pain.  He has failed medical management.  He was worked up with a cervical MRI which demonstrated spondylosis and stenosis at C3-4, C4-5 and C6-7.  I discussed the various treatment options with him.  He has decided proceed with surgery.  Past Medical History:  Diagnosis Date  . Arthritis    back & L knee, R foot - lis franc   . Deep vein thrombosis ([place])    after knee surgery in 2004  . Hypertension   . Murmur, cardiac    pt. reports its difficult to auscultate   . Pneumonia    back in college days.   . Sleep apnea    CPAP- last study- 2014    Past Surgical History:  Procedure Laterality Date  . CERVICAL SPINE SURGERY N/A 1990   C5-6 discectomy  . FINGER SURGERY Right 2008   x 3 index finger  . KNEE ARTHROPLASTY    . KNEE ARTHROSCOPY Bilateral    L 2004, R 2005  . MASS EXCISION Right 01/18/2017   Procedure: EXCISION BENIGN RIGHT NECK MASS;  Surgeon: Rozetta Nunnery, MD;  Location: [facility];  Service: ENT;  Laterality: Right;  . MASS EXCISION Bilateral 03/23/2019   Procedure: EXCISIONS OF RIGHT CHEEK LESION, RIGHT POSTERIOR NECK LIPOMA  AND LEFT NECK NODULE;  Surgeon: Rozetta Nunnery, MD;  Location: [facility];  Service: ENT;  Laterality: Bilateral;  . NASAL FRACTURE SURGERY    . 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 TOTAL KNEE ARTHROPLASTY Right 06/01/2016   Procedure: TOTAL KNEE ARTHROPLASTY;  Surgeon: [name], MD;  Location: [facility];  Service: Orthopedics;  Laterality: Right;  . TOTAL KNEE ARTHROPLASTY Left 07/25/2018   Procedure: LEFT TOTAL KNEE ARTHROPLASTY;  Surgeon: [name], MD;  Location: [facility];  Service: Orthopedics;  Laterality: Left;    No Known Allergies   Social History   Tobacco Use  . Smoking status: Never Smoker  . Smokeless tobacco: Never Used  Substance Use Topics  . Alcohol use: Yes    Comment: rarely    Family History  Problem Relation Age of Onset  . Alzheimer's disease Father   . Colon cancer Neg Hx   . Esophageal cancer Neg Hx   . Rectal cancer Neg Hx   . Stomach cancer Neg Hx    Prior to Admission medications   Medication Sig Start Date End Date Taking? Authorizing Provider  Acetylcysteine (N-ACETYL-L-CYSTEINE PO) Take 100 mg by mouth daily.   Yes [provider]  Ascorbic Acid (VITAMIN C) 1000 MG tablet Take 5,000 mg by mouth daily.   Yes [provider]  B Complex-C (B-COMPLEX WITH VITAMIN C) tablet Take 1 tablet by mouth daily. B- 100   Yes [provider]  Cholecalciferol (VITAMIN D-3) 5000 units TABS Take 5,000 Units by mouth daily.   Yes [provider]  Cyanocobalamin (VITAMIN B-12) 5000 MCG TBDP Take 5,000 mcg by mouth daily.   Yes [provider]  DHEA 50 MG CAPS Take 50 mg by mouth daily.    Yes [provider]  Diindolylmethane POWD Take 250 mg by mouth daily. Capsule   Yes [provider]  fluticasone (FLONASE) 50 MCG/ACT nasal spray Place 1 spray  into both nostrils at bedtime as needed for allergies or rhinitis.    Yes [provider]  Javier Docker Oil 500 MG CAPS Take 500 mg by mouth daily.   Yes [provider]  losartan-hydrochlorothiazide (HYZAAR) 100-25 MG tablet TAKE 1 TABLET BY MOUTH DAILY. 04/07/20  Yes Hilts, Legrand Como, MD  Lysine HCl 500 MG TABS Take 1,000 mg by mouth daily.    Yes [provider]  Magnesium 400 MG TABS Take 400 mg by mouth daily at 8 pm.    Yes [provider]  Menaquinone-7 (VITAMIN K2) 40 MCG TABS Take 40 mcg by mouth daily.    Yes [provider]  PRESCRIPTION MEDICATION Apply 1 application topically daily. Testerone 13.5 % applied to shoulder [facility]   Yes  [provider]  QUERCETIN PO Take 1,000 mg by mouth daily.   Yes [provider]  saw palmetto 160 MG capsule Take 320 mg by mouth daily.   Yes [provider]  amoxicillin (AMOXIL) 500 MG capsule Take 2,000 mg by mouth once.  10/15/19   [provider]     Review of Systems  Positive ROS: As above  All other systems have been reviewed and were otherwise negative with the exception of those mentioned in the HPI and as above.  Objective: Vital signs in last 24 hours: Temp:  [97.5 F (36.4 C)] 97.5 F (36.4 C) (11/01 0623) Pulse Rate:  [58] 58 (11/01 0623) Resp:  [18] 18 (11/01 0623) BP: (140)/(53) 140/53 (11/01 0623) SpO2:  [98 %] 98 % (11/01 0623) Weight:  [90.7 kg] 90.7 kg (11/01 0623) Estimated body mass index is 29.53 kg/m as calculated from the following:   Height as of this encounter: 5\' 9"  (1.753 m).   Weight as of this encounter: 90.7 kg.   General Appearance: Alert Head: Normocephalic, without obvious abnormality, atraumatic Eyes: PERRL, conjunctiva/corneas clear, EOM's intact,    Ears: Normal  Throat: Normal  Neck: Limited cervical range of motion.  Spurling's testing is positive on the left.  His left anterior cervical incision is well-healed. Back: unremarkable Lungs: Clear to auscultation bilaterally, respirations unlabored Heart: Regular rate and rhythm, no murmur, rub or gallop Abdomen: Soft, non-tender Extremities: Extremities normal, atraumatic, no cyanosis or edema Skin: unremarkable  NEUROLOGIC:   Mental status: alert and oriented,Motor Exam - grossly normal Sensory Exam - grossly normal Reflexes:  Coordination - grossly normal Gait - grossly normal Balance - grossly normal Cranial Nerves: I: smell Not tested  II: visual acuity  OS: Normal  OD: Normal   II: visual fields Full to confrontation  II: pupils Equal, round, reactive to light  III,VII: ptosis None  III,IV,VI: extraocular muscles  Full ROM  V:  mastication Normal  V: facial light touch sensation  Normal  V,VII: corneal reflex  Present  VII: facial muscle function - upper  Normal  VII: facial muscle function - lower Normal  VIII: hearing Not tested  IX: soft palate elevation  Normal  IX,X: gag reflex Present  XI: trapezius strength  5/5  XI: sternocleidomastoid strength 5/5  XI: neck flexion strength  5/5  XII: tongue strength  Normal    Data Review Lab Results  Component Value Date   WBC 4.7 04/10/2020   HGB 14.0 04/10/2020   HCT 42.1 04/10/2020   MCV 98.1 04/10/2020   PLT 194 04/10/2020   Lab Results  Component Value Date   NA 139 04/10/2020   K 3.9 04/10/2020  CL 105 04/10/2020   CO2 22 04/10/2020   BUN 25 (H) 04/10/2020   CREATININE 1.00 04/10/2020   GLUCOSE 136 (H) 04/10/2020   Lab Results  Component Value Date   INR 0.98 07/14/2018    Assessment/Plan: Cervical spondylosis, cervical stenosis, cervical radiculopathy, cervicalgia: I have discussed the situation with the patient.  I reviewed his imaging studies with him and pointed out the abnormalities.  We have discussed the various treatment options including surgery.  I have described the surgical treatment option of a C3-4, C4-5 and C6-7 anterior cervical discectomy, fusion and plating.  I have shown him surgical models.  I have given him a surgical pamphlet.  We have discussed the risk, benefits, alternatives, expected postop course, and likelihood of achieving our goals with surgery.  I have answered all the patient's questions.  He has decided to proceed with surgery.   Ophelia Charter 04/14/2020 7:27 AM

## 2020-04-14 NOTE — Anesthesia Postprocedure Evaluation (Signed)
Anesthesia Post Note  Patient: RAWLIN REAUME  Procedure(s) Performed: ANTERIOR CERVICAL DECOMPRESSION/DISCECTOMY FUSION, INTERBODY PROSTHESIS, PLATE/SCREWS CERVICAL THREE-FOUR,CERVICAL FOUR-FIVE,CERVICAL SIX-SEVEN (N/A Spine Cervical)     Patient location during evaluation: PACU Anesthesia Type: General Level of consciousness: awake and alert Pain management: pain level controlled Vital Signs Assessment: post-procedure vital signs reviewed and stable Respiratory status: spontaneous breathing, nonlabored ventilation and respiratory function stable Cardiovascular status: blood pressure returned to baseline and stable Postop Assessment: no apparent nausea or vomiting Anesthetic complications: no   No complications documented.  Last Vitals:  Vitals:   04/14/20 1233 04/14/20 1252  BP: (!) 148/61 (!) 154/67  Pulse: 78 69  Resp: 17 18  Temp:  37.2 C  SpO2: 95% 95%    Last Pain:  Vitals:   04/14/20 1328  TempSrc:   PainSc: 5                  Lidia Collum

## 2020-04-14 NOTE — Anesthesia Procedure Notes (Signed)
Procedure Name: Intubation Date/Time: 04/14/2020 7:43 AM Performed by: Lance Coon, CRNA Pre-anesthesia Checklist: Patient identified, Suction available, Emergency Drugs available, Patient being monitored and Timeout performed Patient Re-evaluated:Patient Re-evaluated prior to induction Oxygen Delivery Method: Circle system utilized Preoxygenation: Pre-oxygenation with 100% oxygen Induction Type: IV induction Ventilation: Mask ventilation without difficulty Laryngoscope Size: Miller and 3 Grade View: Grade I Tube type: Oral Tube size: 7.5 mm Number of attempts: 1 Airway Equipment and Method: Stylet Placement Confirmation: ETT inserted through vocal cords under direct vision,  positive ETCO2 and breath sounds checked- equal and bilateral Secured at: 21 cm Tube secured with: Tape Dental Injury: Teeth and Oropharynx as per pre-operative assessment

## 2020-04-14 NOTE — Progress Notes (Signed)
Orthopedic Tech Progress Note Patient Details:  MANNIX KROEKER 1950-09-12 [id number] Confirmed with RN that pt has cervical collar Patient ID: HERCULES HASLER, male   DOB: [birth year], 69 y.o.   MRN: [id number]   Tammy Sours 04/14/2020, 1:15 PM

## 2020-04-15 ENCOUNTER — Other Ambulatory Visit (HOSPITAL_COMMUNITY): Payer: Self-pay | Admitting: Neurosurgery

## 2020-04-15 ENCOUNTER — Encounter (HOSPITAL_COMMUNITY): Payer: Self-pay | Admitting: Neurosurgery

## 2020-04-15 MED ORDER — OXYCODONE-ACETAMINOPHEN 5-325 MG PO TABS: 1.0000 | ORAL_TABLET | ORAL | Status: DC | PRN

## 2020-04-15 MED ORDER — OXYCODONE-ACETAMINOPHEN 5-325 MG PO TABS: 1.0000 | ORAL_TABLET | ORAL | 0 refills | Status: DC | PRN

## 2020-04-15 MED ORDER — CYCLOBENZAPRINE HCL 10 MG PO TABS: 10.0000 mg | ORAL_TABLET | Freq: Three times a day (TID) | ORAL | 0 refills | Status: DC | PRN

## 2020-04-15 MED ORDER — DOCUSATE SODIUM 100 MG PO CAPS
100.0000 mg | ORAL_CAPSULE | Freq: Two times a day (BID) | ORAL | 0 refills | Status: DC
Start: 2020-04-15 — End: 2020-10-20

## 2020-04-15 MED ORDER — PANTOPRAZOLE SODIUM 40 MG PO TBEC: 40.0000 mg | DELAYED_RELEASE_TABLET | Freq: Every day | ORAL | Status: DC

## 2020-04-15 MED FILL — OXYCODONE-APAP 5-325MG: 5-325 | 3 days supply | Qty: 30 | Fill #0

## 2020-04-15 MED FILL — CYCLOBENZAPRINE HCL 10 MG T: 10 | 10 days supply | Qty: 30 | Fill #0

## 2020-04-15 MED FILL — Thrombin For Soln 5000 Unit: CUTANEOUS | Qty: 5000 | Status: AC

## 2020-04-15 NOTE — Discharge Summary (Signed)
Physician Discharge Summary  Patient ID: James Gallegos MRN: [id number] DOB/AGE: August 11, 1950 69 y.o.  Admit date: 04/14/2020 Discharge date: 04/15/2020  Admission Diagnoses: cervical spondylosis, cervical stenosis, cervical radiculopathy, cervical myelopathy,  cervicalgia  Discharge Diagnoses:  The same Active Problems:   Cervical spondylosis with myelopathy and radiculopathy   Discharged Condition: good  Hospital Course:  I performed a C3-4, C4-5 and C6-7 anterior cervical diskectomy, fusion and plating on the patient on 04/14/2020.  The surgery went well.    The patient's postoperative course was unremarkable.  On postoperative day 1. He requested discharge home.  He was given written and oral discharge instructions.  All his questions were answered.  Consults:  PT, OT, care management Significant Diagnostic Studies: none Treatments: C3-4, C4-5 and C6-7 anterior cervical diskectomy, fusion and plating. Discharge Exam: Blood pressure (!) 130/57, pulse (!) 54, temperature 98.8 F (37.1 C), temperature source Oral, resp. rate 19, height 5\' 9"  (1.753 m), weight 90.7 kg, SpO2 98 %.   The patient is alert and pleasant.  His strength is normal.  His dressing is clean and dry.  There is no hematoma or shift.  Disposition:   Home  Discharge Instructions    Call MD for:  difficulty breathing, headache or visual disturbances   Complete by: As directed    Call MD for:  extreme fatigue   Complete by: As directed    Call MD for:  hives   Complete by: As directed    Call MD for:  persistant dizziness or light-headedness   Complete by: As directed    Call MD for:  persistant nausea and vomiting   Complete by: As directed    Call MD for:  redness, tenderness, or signs of infection (pain, swelling, redness, odor or green/yellow discharge around incision site)   Complete by: As directed    Call MD for:  severe uncontrolled pain   Complete by: As directed    Call MD for:  temperature  >100.4   Complete by: As directed    Diet - low sodium heart healthy   Complete by: As directed    Discharge instructions   Complete by: As directed    Call [phone] for a followup appointment. Take a stool softener while you are using pain medications.   Driving Restrictions   Complete by: As directed    Do not drive for 2 weeks.   Increase activity slowly   Complete by: As directed    Lifting restrictions   Complete by: As directed    Do not lift more than 5 pounds. No excessive bending or twisting.   May shower / Bathe   Complete by: As directed    Remove the dressing for 3 days after surgery.  You may shower, but leave the incision alone.   Remove dressing in 48 hours   Complete by: As directed      Allergies as of 04/15/2020   No Known Allergies     Medication List    TAKE these medications   amoxicillin 500 MG capsule Commonly known as: AMOXIL Take 2,000 mg by mouth once.   B-complex with vitamin C tablet Take 1 tablet by mouth daily. B- 100   cyclobenzaprine 10 MG tablet Commonly known as: FLEXERIL Take 1 tablet (10 mg total) by mouth 3 (three) times daily as needed for muscle spasms.   DHEA 50 MG Caps Take 50 mg by mouth daily.   Diindolylmethane Powd Take 250 mg by mouth daily. Capsule  docusate sodium 100 MG capsule Commonly known as: COLACE Take 1 capsule (100 mg total) by mouth 2 (two) times daily.   fluticasone 50 MCG/ACT nasal spray Commonly known as: FLONASE Place 1 spray into both nostrils at bedtime as needed for allergies or rhinitis.   Krill Oil 500 MG Caps Take 500 mg by mouth daily.   losartan-hydrochlorothiazide 100-25 MG tablet Commonly known as: HYZAAR TAKE 1 TABLET BY MOUTH DAILY.   Lysine HCl 500 MG Tabs Take 1,000 mg by mouth daily.   Magnesium 400 MG Tabs Take 400 mg by mouth daily at 8 pm.   N-ACETYL-L-CYSTEINE PO Take 100 mg by mouth daily.   oxyCODONE-acetaminophen 5-325 MG tablet Commonly known as:  PERCOCET/ROXICET Take 1-2 tablets by mouth every 4 (four) hours as needed for moderate pain.   PRESCRIPTION MEDICATION Apply 1 application topically daily. Testerone 13.5 % applied to shoulder [facility]   QUERCETIN PO Take 1,000 mg by mouth daily.   saw palmetto 160 MG capsule Take 320 mg by mouth daily.   Vitamin B-12 5000 MCG Tbdp Take 5,000 mcg by mouth daily.   vitamin C 1000 MG tablet Take 5,000 mg by mouth daily.   Vitamin D-3 125 MCG (5000 UT) Tabs Take 5,000 Units by mouth daily.   Vitamin K2 40 MCG Tabs Take 40 mcg by mouth daily.        Signed: Ophelia Charter 04/15/2020, 9:13 AM

## 2020-04-15 NOTE — Plan of Care (Signed)
Patient alert and oriented, mae's well, voiding adequate amount of urine, swallowing without difficulty, no c/o pain at time of discharge. Patient discharged home with family. Script and discharged instructions given to patient. Patient and family stated understanding of instructions given. Patient has an appointment with Dr. Jenkins   

## 2020-04-15 NOTE — Evaluation (Signed)
Occupational Therapy Evaluation Patient Details Name: James Gallegos MRN: [id number] DOB: March 18, 1951 Today's Date: 04/15/2020    History of Present Illness Pt is a 69 y/o male with previous C5-6 discectomy, presenting for C3-4, C4-5, C6-7 ACDF due to pain. PMH includes: arthritis, HTN, PNA, sleep apnea.    Clinical Impression   PTA patient reports independent and working. Admitted for above and limited by problem list below, including cervical precautions, pain.  Patient educated on precautions, ADl compensatory techniques, activity progression, AE recommendations and safety.  He will have support of daughter as needed initially, spouse will be working.  Patient completing ADls with supervision, transfers and in room mobility with supervision. Good understanding of brace wear schedule and management.  Based on performance today, no further OT needs have been identified and OT will sign off. Thank you for this referral!   Follow Up Recommendations  No OT follow up;Supervision - Intermittent    Equipment Recommendations  None recommended by OT    Recommendations for Other Services       Precautions / Restrictions Precautions Precautions: Cervical Precaution Booklet Issued: Yes (comment) Precaution Comments: reviewed precautions  Required Braces or Orthoses: Cervical Brace Cervical Brace: Hard collar (when OOB ) Restrictions Weight Bearing Restrictions: No      Mobility Bed Mobility Overal bed mobility: Modified Independent             General bed mobility comments: log roll technique, no assist required     Transfers Overall transfer level: Needs assistance Equipment used: None Transfers: Sit to/from Stand Sit to Stand: Supervision         General transfer comment: good posture and technique, no physical assist required    Balance Overall balance assessment: Mild deficits observed, not formally tested                                          ADL either performed or assessed with clinical judgement   ADL Overall ADL's : Needs assistance/impaired     Grooming: Modified independent;Standing   Upper Body Bathing: Set up;Sitting   Lower Body Bathing: Supervison/ safety;Sit to/from stand Lower Body Bathing Details (indicate cue type and reason): reviewed techniques for safety, using long sponge as needed Upper Body Dressing : Set up;Sitting   Lower Body Dressing: Supervision/safety;Sit to/from stand Lower Body Dressing Details (indicate cue type and reason): reviewed techniques for safety  Toilet Transfer: Ambulation;Modified Independent           Functional mobility during ADLs: Supervision/safety General ADL Comments: reviewed cervical precautions, ADL compesnatory techniques and activity progression      Vision         Perception     Praxis      Pertinent Vitals/Pain Pain Assessment: Faces Faces Pain Scale: Hurts a little bit Pain Location: cervical incision Pain Descriptors / Indicators: Discomfort;Operative site guarding Pain Intervention(s): Limited activity within patient's tolerance;Monitored during session     Hand Dominance Right   Extremity/Trunk Assessment Upper Extremity Assessment Upper Extremity Assessment: Overall WFL for tasks assessed   Lower Extremity Assessment Lower Extremity Assessment: Overall WFL for tasks assessed   Cervical / Trunk Assessment Cervical / Trunk Assessment: Other exceptions Cervical / Trunk Exceptions: s/p cervical surgery   Communication Communication Communication: No difficulties   Cognition Arousal/Alertness: Awake/alert Behavior During Therapy: WFL for tasks assessed/performed Overall Cognitive Status: Within Functional Limits for tasks assessed  General Comments  reports his daugther will be providing 24/7 support as long as needed    Exercises     Shoulder Instructions      Home Living  Family/patient expects to be discharged to:: Private residence Living Arrangements: Spouse/significant other Available Help at Discharge: Family;Available 24 hours/day Type of Home: House Home Access: Stairs to enter [employer] of Steps: 3 Entrance Stairs-Rails:  (+ rail) Home Layout: One level     Bathroom Shower/Tub: Occupational psychologist: Handicapped height                Prior Functioning/Environment Level of Independence: Independent        Comments: works, drives         OT Problem List: Pain;Decreased knowledge of precautions;Decreased knowledge of use of DME or AE;Impaired balance (sitting and/or standing);Decreased activity tolerance      OT Treatment/Interventions:      OT Goals(Current goals can be found in the care plan section) Acute Rehab OT Goals Patient Stated Goal: home and get back to work soon OT Goal Formulation: With patient  OT Frequency:     Barriers to D/C:            Co-evaluation              AM-PAC OT "6 Clicks" Daily Activity     Outcome Measure Help from another person eating meals?: None Help from another person taking care of personal grooming?: None Help from another person toileting, which includes using toliet, bedpan, or urinal?: None Help from another person bathing (including washing, rinsing, drying)?: A Little Help from another person to put on and taking off regular upper body clothing?: None Help from another person to put on and taking off regular lower body clothing?: A Little 6 Click Score: 22   End of Session Equipment Utilized During Treatment: Cervical collar Nurse Communication: Mobility status  Activity Tolerance: Patient tolerated treatment well Patient left: in bed;with call bell/phone within reach  OT Visit Diagnosis: Pain Pain - part of body:  (neck incisional)                Time: 0177-9390 OT Time Calculation (min): 18 min Charges:  OT General Charges $OT Visit: 1  Visit OT Evaluation $OT Eval Low Complexity: 1 Low  Jolaine Artist, OT Acute Rehabilitation Services Pager [phone] Office [phone]   Delight Stare 04/15/2020, 8:44 AM

## 2020-04-16 ENCOUNTER — Other Ambulatory Visit: Payer: Self-pay | Admitting: *Deleted

## 2020-04-16 NOTE — Patient Outreach (Signed)
[facility]) Care Management  04/16/2020  James Gallegos 03/30/1951 [id number]   Transition of care telephone call  Referral received:04/14/20 Initial outreach:04/16/20 Insurance: UMR   Initial unsuccessful telephone call to patient's preferred number in order to complete transition of care assessment; no answer, left HIPAA compliant voicemail message requesting return call.   Objective: Per the electronic medical record, James Gallegos   was hospitalized at [facility] 11/1-11/2/21 for Anterior Cervical discectomy C3-4, 4-5, 6-7.Past Medical history : Hypertension, Osteoarthritis, Right total knee replacement, Cervical spondylosis with myelopathy and radiculopathy.  He was discharged to home on 04/15/20 without the need for home health services or durable medical equipment per the discharge summary.   Plan: This RNCM will route unsuccessful outreach letter with [place] Management pamphlet and 24 hour Nurse Advice Line Magnet to [facility] clinical pool to be mailed to patient's home address. This RNCM will attempt another outreach within 4 business days.   Joylene Draft, RN, BSN  [place] Management Coordinator  [phone]- Mobile [phone]- Toll Free Main Office

## 2020-04-21 ENCOUNTER — Encounter: Payer: Self-pay | Admitting: *Deleted

## 2020-04-21 ENCOUNTER — Other Ambulatory Visit: Payer: Self-pay | Admitting: *Deleted

## 2020-04-21 NOTE — Patient Outreach (Signed)
[name]) Care Management  04/21/2020  James Gallegos 03/17/1951 [id number]   Transition of care call/case closure   Referral received:04/14/20 Initial outreach:04/16/20 Insurance: UMR    Subjective: 2nd attempt successful telephone call to patient's preferred number in order to complete transition of care assessment; 2 HIPAA identifiers verified. Explained purpose of call and completed transition of care assessment.  James Gallegos states that he is doing pretty good to have gone through recent surgery.  denies post-operative problems, says surgical incisions are unremarkable, states surgical pain well managed with prescribed medications. She discussed having sore throat from surgery, states surgeon told him to expect that , he is still sticking with softer foods. He  denies bowel or bladder problems taking stool softeners . Spouse/daughter are assisting with his recovery.   Reviewed accessing the following [place] Benefits :  He is unsure if his wife has the  hospital indemnity plan but will have her file a claim if needed.  He uses a [facility] outpatient pharmacy, [facility].      Objective:  James Gallegos   was hospitalized at [facility] 11/1-11/2/21 for Anterior Cervical discectomy C3-4, 4-5, 6-7.Past Medical history : Hypertension, Osteoarthritis, Right total knee replacement, Cervical spondylosis with myelopathy and radiculopathy.  He was discharged to home on 04/15/20 without the need for home health services or durable medical equipment per the discharge summary.  Assessment:  Patient voices good understanding of all discharge instructions.  See transition of care flowsheet for assessment details.   Plan:  Reviewed hospital discharge diagnosis of Anterior discectomy  and discharge treatment plan using hospital discharge instructions, assessing medication adherence, reviewing problems requiring provider notification, and  discussing the importance of follow up with surgeon, primary care provider and/or specialists as directed.  Reviewed [place] healthy lifestyle program information to receive discounted premium for  2023   Step 1: Get  your annual physical  Step 2: Complete your health assessment  Step 3:Identify your current health status and complete the corresponding action step between January 1, and February 12, 2021.    No ongoing care management needs identified so will close case to [facility] Management services. Patient has been routed outreach letter on initial  outreach call . Marland Kitchen   Joylene Draft, RN, BSN  [place] Management Coordinator  [phone]- Mobile [phone]- Toll Free Main Office

## 2020-05-13 DIAGNOSIS — M4712 Other spondylosis with myelopathy, cervical region: Secondary | ICD-10-CM | POA: Diagnosis not present

## 2020-05-21 ENCOUNTER — Other Ambulatory Visit: Payer: Self-pay

## 2020-05-21 ENCOUNTER — Ambulatory Visit (INDEPENDENT_AMBULATORY_CARE_PROVIDER_SITE_OTHER): Payer: 59 | Admitting: Orthopaedic Surgery

## 2020-05-21 ENCOUNTER — Encounter: Payer: Self-pay | Admitting: Orthopaedic Surgery

## 2020-05-21 VITALS — Ht 69.0 in | Wt 200.0 lb

## 2020-05-21 DIAGNOSIS — M545 Low back pain, unspecified: Secondary | ICD-10-CM

## 2020-05-21 DIAGNOSIS — G8929 Other chronic pain: Secondary | ICD-10-CM

## 2020-05-21 NOTE — Progress Notes (Signed)
Office Visit Note   Patient: James Gallegos           Date of Birth: [birth year]           MRN: [id number] Visit Date: 05/21/2020              Requested by: Eunice Blase, MD [address] [place],  [place] [address] PCP: Eunice Blase, MD   Assessment & Plan: Visit Diagnoses:  1. Chronic midline low back pain without sciatica     Plan: Mr. Weigelt recently had neck surgery by Dr. Arnoldo Morale and is doing very well.  He notes that in the postoperative period he developed acute onset of right buttock pain.  It slowly has resolved but he was just concerned.  He has had a prior MRI scan of the lumbar spine demonstrating degenerative changes but no evidence of nerve compression.  I suspect that he has probably had an exacerbation of his back pain with referred discomfort to the right buttock.  Neurologically is intact.  He does have evidence of arthritis in both of his hips and a course that could have played some part but he is feeling much much better.  No further treatment at this point if he has any further trouble or exacerbation he will return. He also notes that at some point in the past he had a tick bite and there was a concerned about Lyme disease and was treated with a combination of doxycycline and Cipro.  He had concerns that he may be having flareups of some type of arthritis.  I suspect based on the arthritis in his neck and back both hips and in the knees that it is probably osteoarthritis.  I did offer him a referral to one of the rheumatologist but he preferred to hold  Follow-Up Instructions: No follow-ups on file.   Orders:  No orders of the defined types were placed in this encounter.  No orders of the defined types were placed in this encounter.     Procedures: No procedures performed   Clinical Data: No additional findings.   Subjective: Chief Complaint  Patient presents with  . Right Hip - Pain  Patient presents today for right sided buttock pain. He said  that he had neck surgery at the beginning of November with Dr.Jenkins. He said that following the surgery he had surgery in his right buttock with no injury. He wonders if it was caused by a way the staff may have moved him during surgery. The pain does not radiate down his leg. His right leg was very weak. He states that it has basically resolved on his own. He does still have some pain with bending over. He takes Ibuprofen if needed. No groin pain.   HPI  Review of Systems   Objective: Vital Signs: Ht 5\' 9"  (1.753 m)   Wt 200 lb (90.7 kg)   BMI 29.53 kg/m   Physical Exam Constitutional:      Appearance: He is well-developed.  Eyes:     Pupils: Pupils are equal, round, and reactive to light.  Pulmonary:     Effort: Pulmonary effort is normal.  Skin:    General: Skin is warm and dry.  Neurological:     Mental Status: He is alert and oriented to person, place, and time.  Psychiatric:        Behavior: Behavior normal.     Ortho Exam straight leg raise negative bilaterally.  He did not have any pain in  either the right or left buttock.  There was no pain over the either trochanter.  He did have some slight decrease range of motion of both hips with internal and external rotation.  He has had bilateral knee replacements without a problem.  Neurologically intact  Specialty Comments:  No specialty comments available.  Imaging: No results found.   PMFS History: Patient Active Problem List   Diagnosis Date Noted  . Cervical spondylosis with myelopathy and radiculopathy 04/14/2020  . Impingement syndrome of right shoulder 10/10/2019  . Low back pain 10/10/2019  . Irritation of ulnar nerve, left 08/28/2019  . Class 2 obesity due to excess calories without serious comorbidity with body mass index (BMI) of 39.0 to 39.9 in adult 01/08/2019  . Pain in right elbow 12/06/2018  . Primary osteoarthritis of left knee 07/25/2018  . History of left knee replacement 07/25/2018  . Right  foot pain 06/22/2018  . Acute left-sided low back pain with left-sided sciatica 06/22/2018  . Osteoarthritis 11/21/2017  . Hyperlipidemia 11/21/2017  . Low testosterone 09/26/2016  . Healthcare maintenance 09/26/2016  . S/P total knee replacement using cement, right 06/01/2016  . Sleep apnea 05/19/2016  . Hypertension 05/19/2016   Past Medical History:  Diagnosis Date  . Arthritis    back & L knee, R foot - lis franc   . Deep vein thrombosis ([place])    after knee surgery in 2004  . Hypertension   . Murmur, cardiac    pt. reports its difficult to auscultate   . Pneumonia    back in college days.   . Sleep apnea    CPAP- last study- 2014    Family History  Problem Relation Age of Onset  . Alzheimer's disease Father   . Colon cancer Neg Hx   . Esophageal cancer Neg Hx   . Rectal cancer Neg Hx   . Stomach cancer Neg Hx     Past Surgical History:  Procedure Laterality Date  . ANTERIOR CERVICAL DECOMP/DISCECTOMY FUSION N/A 04/14/2020   Procedure: ANTERIOR CERVICAL DECOMPRESSION/DISCECTOMY FUSION, INTERBODY PROSTHESIS, PLATE/SCREWS CERVICAL THREE-FOUR,CERVICAL FOUR-FIVE,CERVICAL SIX-SEVEN;  Surgeon: Newman Pies, MD;  Location: [facility];  Service: Neurosurgery;  Laterality: N/A;  anterior  . CERVICAL SPINE SURGERY N/A 1990   C5-6 discectomy  . FINGER SURGERY Right 2008   x 3 index finger  . KNEE ARTHROPLASTY    . KNEE ARTHROSCOPY Bilateral    L 2004, R 2005  . MASS EXCISION Right 01/18/2017   Procedure: EXCISION BENIGN RIGHT NECK MASS;  Surgeon: Rozetta Nunnery, MD;  Location: [facility];  Service: ENT;  Laterality: Right;  . MASS EXCISION Bilateral 03/23/2019   Procedure: EXCISIONS OF RIGHT CHEEK LESION, RIGHT POSTERIOR NECK LIPOMA  AND LEFT NECK NODULE;  Surgeon: Rozetta Nunnery, MD;  Location: [facility];  Service: ENT;  Laterality: Bilateral;  . NASAL FRACTURE SURGERY    . 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 TOTAL KNEE  ARTHROPLASTY Right 06/01/2016   Procedure: TOTAL KNEE ARTHROPLASTY;  Surgeon: [name], MD;  Location: [facility];  Service: Orthopedics;  Laterality: Right;  . TOTAL KNEE ARTHROPLASTY Left 07/25/2018   Procedure: LEFT TOTAL KNEE ARTHROPLASTY;  Surgeon: [name], MD;  Location: [facility];  Service: Orthopedics;  Laterality: Left;   Social History   Occupational History  . Not on file  Tobacco Use  . Smoking status: Never Smoker  . Smokeless tobacco: Never Used  Vaping Use  . Vaping Use:  Never used  Substance and Sexual Activity  . Alcohol use: Yes    Comment: rarely  . Drug use: No  . Sexual activity: Not on file

## 2020-05-23 DIAGNOSIS — G4733 Obstructive sleep apnea (adult) (pediatric): Secondary | ICD-10-CM | POA: Diagnosis not present

## 2020-07-04 MED FILL — LOSARTAN-HCTZ 100-25 MG TAB: 100-25 | 90 days supply | Qty: 90 | Fill #1

## 2020-08-29 DIAGNOSIS — G5622 Lesion of ulnar nerve, left upper limb: Secondary | ICD-10-CM | POA: Diagnosis not present

## 2020-08-29 DIAGNOSIS — M4712 Other spondylosis with myelopathy, cervical region: Secondary | ICD-10-CM | POA: Diagnosis not present

## 2020-09-02 ENCOUNTER — Ambulatory Visit (INDEPENDENT_AMBULATORY_CARE_PROVIDER_SITE_OTHER): Payer: 59 | Admitting: Orthopaedic Surgery

## 2020-09-02 ENCOUNTER — Other Ambulatory Visit: Payer: Self-pay

## 2020-09-02 ENCOUNTER — Encounter: Payer: Self-pay | Admitting: Orthopaedic Surgery

## 2020-09-02 VITALS — Ht 69.0 in | Wt 217.0 lb

## 2020-09-02 DIAGNOSIS — M25522 Pain in left elbow: Secondary | ICD-10-CM | POA: Diagnosis not present

## 2020-09-02 DIAGNOSIS — G5622 Lesion of ulnar nerve, left upper limb: Secondary | ICD-10-CM

## 2020-09-02 DIAGNOSIS — G5620 Lesion of ulnar nerve, unspecified upper limb: Secondary | ICD-10-CM | POA: Insufficient documentation

## 2020-09-02 NOTE — Progress Notes (Signed)
Office Visit Note   Patient: James Gallegos           Date of Birth: 1950-11-28           MRN: [id number] Visit Date: 09/02/2020              Requested by: Eunice Blase, MD [address] [place],  [place] [address] PCP: Eunice Blase, MD   Assessment & Plan: Visit Diagnoses:  1. Pain in left elbow   2. Ulnar neuropathy of left upper extremity     Plan: Mr. Alberto is experiencing symptoms consistent with ulnar nerve compression at the left elbow.  He has positive Tinel's over the nerve with some altered sensibility in the ring more than the little finger.  Motor exam is intact.  He had recent cervical spine surgery by Dr. Arnoldo Morale who did not feel it the neck was involved with this present symptoms.  He thought that the issue was the ulnar nerve compression I referred him back to the office.  He had EMGs and nerve conduction studies of his left upper extremity last year that were "inconclusive".  I am going to repeat the studies as I think the problem is localized to the ulnar nerve at the elbow  Follow-Up Instructions: Return After EMGs and nerve conduction studies left upper extremity.   Orders:  Orders Placed This Encounter  Procedures  . Ambulatory referral to Physical Medicine Rehab   No orders of the defined types were placed in this encounter.     Procedures: No procedures performed   Clinical Data: No additional findings.   Subjective: Chief Complaint  Patient presents with  . Left Hand - Pain, Numbness  Patient presents today for left hand pain. He states that he has pain and numbness in his left pinky and ring finger. This has been going on for quite some time, but worsening. He is right hand dominant. He states that he spoke with Dr.Whtifield about this before and the possibility of it being ulnar nerve entrapment. He has had previous C-Spine surgery with Dr.Jenkins on 04/14/2020.  HPI  Review of Systems   Objective: Vital Signs: Ht 5\' 9"  (1.753 m)    Wt 217 lb (98.4 kg)   BMI 32.05 kg/m   Physical Exam Constitutional:      Appearance: He is well-developed.  Eyes:     Pupils: Pupils are equal, round, and reactive to light.  Pulmonary:     Effort: Pulmonary effort is normal.  Skin:    General: Skin is warm and dry.  Neurological:     Mental Status: He is alert and oriented to person, place, and time.  Psychiatric:        Behavior: Behavior normal.     Ortho Exam awake alert and oriented x3.  Comfortable sitting.  Full range of motion left elbow but definite positive Tinel's over the ulnar nerve.  Minimal discomfort.  No pain in Guyon's canal.  Good strength with AB duction and abduction of fingers.  Has a little altered sensibility in the ring finger and minimally in the little finger.  No carpal tunnel symptoms.  No pain with range of motion of the shoulder  Specialty Comments:  No specialty comments available.  Imaging: No results found.   PMFS History: Patient Active Problem List   Diagnosis Date Noted  . Neuropathy, 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  . Cervical spondylosis with myelopathy and radiculopathy 04/14/2020  . Impingement syndrome of right shoulder 10/10/2019  . Low back  pain 10/10/2019  . Irritation of ulnar nerve, left 08/28/2019  . Class 2 obesity due to excess calories without serious comorbidity with body mass index (BMI) of 39.0 to 39.9 in adult 01/08/2019  . Pain in right elbow 12/06/2018  . Primary osteoarthritis of left knee 07/25/2018  . History of left knee replacement 07/25/2018  . Right foot pain 06/22/2018  . Acute left-sided low back pain with left-sided sciatica 06/22/2018  . Osteoarthritis 11/21/2017  . Hyperlipidemia 11/21/2017  . Low testosterone 09/26/2016  . Healthcare maintenance 09/26/2016  . S/P total knee replacement using cement, right 06/01/2016  . Sleep apnea 05/19/2016  . Hypertension 05/19/2016   Past Medical History:  Diagnosis Date  . Arthritis    back & L knee, R foot - lis  franc   . Deep vein thrombosis ([place])    after knee surgery in 2004  . Hypertension   . Murmur, cardiac    pt. reports its difficult to auscultate   . Pneumonia    back in college days.   . Sleep apnea    CPAP- last study- 2014    Family History  Problem Relation Age of Onset  . Alzheimer's disease Father   . Colon cancer Neg Hx   . Esophageal cancer Neg Hx   . Rectal cancer Neg Hx   . Stomach cancer Neg Hx     Past Surgical History:  Procedure Laterality Date  . ANTERIOR CERVICAL DECOMP/DISCECTOMY FUSION N/A 04/14/2020   Procedure: ANTERIOR CERVICAL DECOMPRESSION/DISCECTOMY FUSION, INTERBODY PROSTHESIS, PLATE/SCREWS CERVICAL THREE-FOUR,CERVICAL FOUR-FIVE,CERVICAL SIX-SEVEN;  Surgeon: Newman Pies, MD;  Location: [facility];  Service: Neurosurgery;  Laterality: N/A;  anterior  . CERVICAL SPINE SURGERY N/A 1990   C5-6 discectomy  . FINGER SURGERY Right 2008   x 3 index finger  . KNEE ARTHROPLASTY    . KNEE ARTHROSCOPY Bilateral    L 2004, R 2005  . MASS EXCISION Right 01/18/2017   Procedure: EXCISION BENIGN RIGHT NECK MASS;  Surgeon: Rozetta Nunnery, MD;  Location: [facility];  Service: ENT;  Laterality: Right;  . MASS EXCISION Bilateral 03/23/2019   Procedure: EXCISIONS OF RIGHT CHEEK LESION, RIGHT POSTERIOR NECK LIPOMA  AND LEFT NECK NODULE;  Surgeon: Rozetta Nunnery, MD;  Location: [facility];  Service: ENT;  Laterality: Bilateral;  . NASAL FRACTURE SURGERY    . 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 TOTAL KNEE ARTHROPLASTY Right 06/01/2016   Procedure: TOTAL KNEE ARTHROPLASTY;  Surgeon: [name], MD;  Location: [facility];  Service: Orthopedics;  Laterality: Right;  . TOTAL KNEE ARTHROPLASTY Left 07/25/2018   Procedure: LEFT TOTAL KNEE ARTHROPLASTY;  Surgeon: [name], MD;  Location: [facility];  Service: Orthopedics;  Laterality: Left;   Social History   Occupational History  . Not on file  Tobacco Use  . Smoking  status: Never Smoker  . Smokeless tobacco: Never Used  Vaping Use  . Vaping Use: Never used  Substance and Sexual Activity  . Alcohol use: Yes    Comment: rarely  . Drug use: No  . Sexual activity: Not on file

## 2020-10-09 ENCOUNTER — Other Ambulatory Visit (HOSPITAL_COMMUNITY): Payer: Self-pay

## 2020-10-09 ENCOUNTER — Other Ambulatory Visit: Payer: Self-pay | Admitting: Family Medicine

## 2020-10-09 MED ORDER — LOSARTAN POTASSIUM-HCTZ 100-25 MG PO TABS
1.0000 | ORAL_TABLET | Freq: Every day | ORAL | 1 refills | Status: DC
  Filled 2020-10-09: qty 30, 30d supply, fill #0

## 2020-10-17 ENCOUNTER — Encounter: Payer: Self-pay | Admitting: Physical Medicine and Rehabilitation

## 2020-10-17 ENCOUNTER — Ambulatory Visit (INDEPENDENT_AMBULATORY_CARE_PROVIDER_SITE_OTHER): Payer: 59 | Admitting: Physical Medicine and Rehabilitation

## 2020-10-17 ENCOUNTER — Other Ambulatory Visit: Payer: Self-pay

## 2020-10-17 DIAGNOSIS — R202 Paresthesia of skin: Secondary | ICD-10-CM

## 2020-10-17 NOTE — Progress Notes (Signed)
James Gallegos - 70 y.o. male MRN [id number]  Date of birth: December 31, 1950  Office Visit Note: Visit Date: 10/17/2020 PCP: Eunice Blase, MD Referred by: Eunice Blase, MD  Subjective: Chief Complaint  Patient presents with  . Left Hand - Pain, Numbness, Tingling   HPI:  James Gallegos is a 70 y.o. male who comes in today at the request of Dr. [name] for electrodiagnostic study of the Left upper extremities.  Patient is Right hand dominant.  Patient has a history of prior C5-6 cervical fusion and then more recently in 2021 patient underwent C3-4 and C4-5 and C6/7 had on fusions by Dr. Newman Pies.  He continues to have 3 out of 10 pain with tingling and numbness in the left hand and the left fifth digit and ring finger.  No symptoms on the right.  He has mainly numbness more than pain.  Prior electrodiagnostic study by Dr. Marlaine Hind has been referred to is inconclusive.  I did review the procedure although I do not have his impression and this does seem to show some level of mild carpal tunnel issue and cervical radiculopathy but no ulnar neuropathy noted.   ROS Otherwise per HPI.  Assessment & Plan: Visit Diagnoses:    ICD-10-CM   1. Paresthesia of skin  R20.2 NCV with EMG (electromyography)    Plan: Impression: Essentially NORMAL electrodiagnostic study of the left upper limb.  There is no significant electrodiagnostic evidence of nerve entrapment (specifically ulnar nerve), brachial plexopathy or cervical radiculopathy.  As you know, purely sensory or demyelinating radiculopathies and chemical radiculitis may not be detected with this particular electrodiagnostic study.  Recommendations: 1.  Follow-up with referring physician. 2.  Continue current management of symptoms.  Meds & Orders: No orders of the defined types were placed in this encounter.   Orders Placed This Encounter  Procedures  . NCV with EMG (electromyography)    Follow-up: Return for [name], MD.   Procedures: No procedures performed  EMG & NCV Findings: All nerve conduction studies (as indicated in the following tables) were within normal limits.    All examined muscles (as indicated in the following table) showed no evidence of electrical instability.    Impression: Essentially NORMAL electrodiagnostic study of the left upper limb.  There is no significant electrodiagnostic evidence of nerve entrapment (specifically ulnar nerve), brachial plexopathy or cervical radiculopathy.  As you know, purely sensory or demyelinating radiculopathies and chemical radiculitis may not be detected with this particular electrodiagnostic study.  Recommendations: 1.  Follow-up with referring physician. 2.  Continue current management of symptoms.  ___________________________ Laurence Spates FAAPMR Board Certified, American Board of Physical Medicine and Rehabilitation    Nerve Conduction Studies Anti Sensory Summary Table   Stim Site NR Peak (ms) Norm Peak (ms) P-T Amp (V) Norm P-T Amp Site1 Site2 Delta-P (ms) Dist (cm) Vel (m/s) Norm Vel (m/s)  Left Median Acr Palm Anti Sensory (2nd Digit)  30.3C  Wrist    3.6 <3.6 35.4 >10 Wrist Palm 1.7 0.0    Palm    1.9 <2.0 31.3         Left Radial Anti Sensory (Base 1st Digit)  30.4C  Wrist    2.2 <3.1 41.3  Wrist Base 1st Digit 2.2 0.0    Left Ulnar Anti Sensory (5th Digit)  31C  Wrist    3.5 <3.7 30.8 >15.0 Wrist 5th Digit 3.5 14.0 40 >38   Motor Summary Table   Stim Site  NR Onset (ms) Norm Onset (ms) O-P Amp (mV) Norm O-P Amp Site1 Site2 Delta-0 (ms) Dist (cm) Vel (m/s) Norm Vel (m/s)  Left Median Motor (Abd Poll Brev)  31.9C  Wrist    4.0 <4.2 5.5 >5 Elbow Wrist 4.7 23.5 50 >50  Elbow    8.7  5.3         Left Ulnar Motor (Abd Dig Min)  31.9C  Wrist    3.2 <4.2 6.6 >3 B Elbow Wrist 4.5 24.0 53 >53  B Elbow    7.7  6.5  A Elbow B Elbow 1.7 10.0 59 >53  A Elbow    9.4  6.4          EMG   Side Muscle Nerve Root Ins Act  Fibs Psw Amp Dur Poly Recrt Int Fraser Din Comment  Left Abd Poll Brev Median C8-T1 Nml Nml Nml Nml Nml 0 Nml Nml   Left 1stDorInt Ulnar C8-T1 Nml Nml Nml Nml Nml 0 Nml Nml   Left PronatorTeres Median C6-7 Nml Nml Nml Nml Nml 0 Nml Nml   Left Biceps Musculocut C5-6 Nml Nml Nml Nml Nml 0 Nml Nml   Left Deltoid Axillary C5-6 Nml Nml Nml Nml Nml 0 Nml Nml     Nerve Conduction Studies Anti Sensory Left/Right Comparison   Stim Site L Lat (ms) R Lat (ms) L-R Lat (ms) L Amp (V) R Amp (V) L-R Amp (%) Site1 Site2 L Vel (m/s) R Vel (m/s) L-R Vel (m/s)  Median Acr Palm Anti Sensory (2nd Digit)  30.3C  Wrist 3.6   35.4   Wrist Palm     Palm 1.9   31.3         Radial Anti Sensory (Base 1st Digit)  30.4C  Wrist 2.2   41.3   Wrist Base 1st Digit     Ulnar Anti Sensory (5th Digit)  31C  Wrist 3.5   30.8   Wrist 5th Digit 40     Motor Left/Right Comparison   Stim Site L Lat (ms) R Lat (ms) L-R Lat (ms) L Amp (mV) R Amp (mV) L-R Amp (%) Site1 Site2 L Vel (m/s) R Vel (m/s) L-R Vel (m/s)  Median Motor (Abd Poll Brev)  31.9C  Wrist 4.0   5.5   Elbow Wrist 50    Elbow 8.7   5.3         Ulnar Motor (Abd Dig Min)  31.9C  Wrist 3.2   6.6   B Elbow Wrist 53    B Elbow 7.7   6.5   A Elbow B Elbow 59    A Elbow 9.4   6.4            Waveforms:             Clinical History: No specialty comments available.     Objective:  VS:  HT:    WT:   BMI:     BP:   HR: bpm  TEMP: ( )  RESP:  Physical Exam Musculoskeletal:        General: No tenderness.     Comments: Inspection reveals no atrophy of the bilateral APB or FDI or hand intrinsics. There is no swelling, color changes, allodynia or dystrophic changes. There is 5 out of 5 strength in the bilateral wrist extension, finger abduction and long finger flexion.  There are dysesthesias or paresthesias in the left ulnar nerve distribution or C8 distribution.. There is a negative Froment's test bilaterally.  There is a negative Hoffmann's test  bilaterally.  Skin:    General: Skin is warm and dry.     Findings: No erythema or rash.  Neurological:     General: No focal deficit present.     Mental Status: He is alert and oriented to person, place, and time.     Sensory: No sensory deficit.     Motor: No weakness or abnormal muscle tone.     Coordination: Coordination normal.     Gait: Gait normal.  Psychiatric:        Mood and Affect: Mood normal.        Behavior: Behavior normal.        Thought Content: Thought content normal.      Imaging: No results found.

## 2020-10-17 NOTE — Progress Notes (Signed)
Pt state pain, tingling and numbness in his left hand. Pt state he has numbness in his left pinky and ring fingers. Pt state he doesn't know what causes the numbness or pain. Pt state he doesn't take anything just deal with the pain. Pt state he is right handed.  Numeric Pain Rating Scale and Functional Assessment Average Pain 3   In the last MONTH (on 0-10 scale) has pain interfered with the following?  1. General activity like being  able to carry out your everyday physical activities such as walking, climbing stairs, carrying groceries, or moving a chair?  Rating(5)

## 2020-10-17 NOTE — Procedures (Signed)
EMG & NCV Findings: All nerve conduction studies (as indicated in the following tables) were within normal limits.    All examined muscles (as indicated in the following table) showed no evidence of electrical instability.    Impression: Essentially NORMAL electrodiagnostic study of the left upper limb.  There is no significant electrodiagnostic evidence of nerve entrapment (specifically ulnar nerve), brachial plexopathy or cervical radiculopathy.  As you know, purely sensory or demyelinating radiculopathies and chemical radiculitis may not be detected with this particular electrodiagnostic study.  Recommendations: 1.  Follow-up with referring physician. 2.  Continue current management of symptoms.  ___________________________ Laurence Spates FAAPMR Board Certified, American Board of Physical Medicine and Rehabilitation    Nerve Conduction Studies Anti Sensory Summary Table   Stim Site NR Peak (ms) Norm Peak (ms) P-T Amp (V) Norm P-T Amp Site1 Site2 Delta-P (ms) Dist (cm) Vel (m/s) Norm Vel (m/s)  Left Median Acr Palm Anti Sensory (2nd Digit)  30.3C  Wrist    3.6 <3.6 35.4 >10 Wrist Palm 1.7 0.0    Palm    1.9 <2.0 31.3         Left Radial Anti Sensory (Base 1st Digit)  30.4C  Wrist    2.2 <3.1 41.3  Wrist Base 1st Digit 2.2 0.0    Left Ulnar Anti Sensory (5th Digit)  31C  Wrist    3.5 <3.7 30.8 >15.0 Wrist 5th Digit 3.5 14.0 40 >38   Motor Summary Table   Stim Site NR Onset (ms) Norm Onset (ms) O-P Amp (mV) Norm O-P Amp Site1 Site2 Delta-0 (ms) Dist (cm) Vel (m/s) Norm Vel (m/s)  Left Median Motor (Abd Poll Brev)  31.9C  Wrist    4.0 <4.2 5.5 >5 Elbow Wrist 4.7 23.5 50 >50  Elbow    8.7  5.3         Left Ulnar Motor (Abd Dig Min)  31.9C  Wrist    3.2 <4.2 6.6 >3 B Elbow Wrist 4.5 24.0 53 >53  B Elbow    7.7  6.5  A Elbow B Elbow 1.7 10.0 59 >53  A Elbow    9.4  6.4          EMG   Side Muscle Nerve Root Ins Act Fibs Psw Amp Dur Poly Recrt Int Fraser Din Comment  Left Abd Poll  Brev Median C8-T1 Nml Nml Nml Nml Nml 0 Nml Nml   Left 1stDorInt Ulnar C8-T1 Nml Nml Nml Nml Nml 0 Nml Nml   Left PronatorTeres Median C6-7 Nml Nml Nml Nml Nml 0 Nml Nml   Left Biceps Musculocut C5-6 Nml Nml Nml Nml Nml 0 Nml Nml   Left Deltoid Axillary C5-6 Nml Nml Nml Nml Nml 0 Nml Nml     Nerve Conduction Studies Anti Sensory Left/Right Comparison   Stim Site L Lat (ms) R Lat (ms) L-R Lat (ms) L Amp (V) R Amp (V) L-R Amp (%) Site1 Site2 L Vel (m/s) R Vel (m/s) L-R Vel (m/s)  Median Acr Palm Anti Sensory (2nd Digit)  30.3C  Wrist 3.6   35.4   Wrist Palm     Palm 1.9   31.3         Radial Anti Sensory (Base 1st Digit)  30.4C  Wrist 2.2   41.3   Wrist Base 1st Digit     Ulnar Anti Sensory (5th Digit)  31C  Wrist 3.5   30.8   Wrist 5th Digit 40     Motor Left/Right Comparison  Stim Site L Lat (ms) R Lat (ms) L-R Lat (ms) L Amp (mV) R Amp (mV) L-R Amp (%) Site1 Site2 L Vel (m/s) R Vel (m/s) L-R Vel (m/s)  Median Motor (Abd Poll Brev)  31.9C  Wrist 4.0   5.5   Elbow Wrist 50    Elbow 8.7   5.3         Ulnar Motor (Abd Dig Min)  31.9C  Wrist 3.2   6.6   B Elbow Wrist 53    B Elbow 7.7   6.5   A Elbow B Elbow 59    A Elbow 9.4   6.4            Waveforms:

## 2020-10-30 ENCOUNTER — Encounter: Payer: Self-pay | Admitting: Orthopaedic Surgery

## 2020-10-30 ENCOUNTER — Other Ambulatory Visit: Payer: Self-pay

## 2020-10-30 ENCOUNTER — Ambulatory Visit (INDEPENDENT_AMBULATORY_CARE_PROVIDER_SITE_OTHER): Payer: 59 | Admitting: Orthopaedic Surgery

## 2020-10-30 DIAGNOSIS — G5622 Lesion of ulnar nerve, left upper limb: Secondary | ICD-10-CM

## 2020-10-30 NOTE — Progress Notes (Signed)
Office Visit Note   Patient: James Gallegos           Date of Birth: 1950-09-15           MRN: [id number] Visit Date: 10/30/2020              Requested by: Eunice Blase, MD [address] [place],  [place] [address] PCP: Eunice Blase, MD   Assessment & Plan: Visit Diagnoses:  1. Ulnar neuropathy of left upper extremity     Plan: Clinically Mr. Taul has compression of the ulnar nerve left elbow.  His recent EMGs nerve conduction studies were normal.  He has positive Tinel's and pain over the ulnar nerve at its groove with numbness and tingling into the radial to digits of his left hand.  He specifically has a little decreased sensibility of the little finger and the ulnar side of the ring.  Despite the EMG and nerve conduction study findings I think the best approach would be ulnar nerve decompression.  He is really having a hard time and interferes with his activities.  Discussed this with detail with him including the surgery what he may expect and he like to proceed.  Follow-Up Instructions: Return We will schedule decompression ulnar nerve left elbow.   Orders:  No orders of the defined types were placed in this encounter.  No orders of the defined types were placed in this encounter.     Procedures: No procedures performed   Clinical Data: No additional findings.   Subjective: Chief Complaint  Patient presents with  . Left Elbow - Follow-up  Patient presents today for follow up on his left elbow pain. He had an EMG study with Dr.Newton on 10/17/2020. He is here to go over those results.  Still having considerable trouble with pain in the ulnar 2 digits of his left hand associated with pain along the ulnar nerve at the elbow  HPI  Review of Systems   Objective: Vital Signs: There were no vitals taken for this visit.  Physical Exam Constitutional:      Appearance: He is well-developed.  Eyes:     Pupils: Pupils are equal, round, and reactive to light.   Pulmonary:     Effort: Pulmonary effort is normal.  Skin:    General: Skin is warm and dry.  Neurological:     Mental Status: He is alert and oriented to person, place, and time.  Psychiatric:        Behavior: Behavior normal.     Ortho Exam awake alert and oriented x3.  Comfortable sitting.  Positive Tinel's and pain over the ulnar nerve at the left elbow.  Pain is referred to the ulnar 2 digits of his left hand.  He does have slight decrease sensibility to the little finger and the ulnar side of the ring.  Motor exam intact  Specialty Comments:  No specialty comments available.  Imaging: No results found.   PMFS History: Patient Active Problem List   Diagnosis Date Noted  . Neuropathy, 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  . Cervical spondylosis with myelopathy and radiculopathy 04/14/2020  . Impingement syndrome of right shoulder 10/10/2019  . Low back pain 10/10/2019  . Irritation of ulnar nerve, left 08/28/2019  . Class 2 obesity due to excess calories without serious comorbidity with body mass index (BMI) of 39.0 to 39.9 in adult 01/08/2019  . Pain in right elbow 12/06/2018  . Primary osteoarthritis of left knee 07/25/2018  . History of left knee replacement  07/25/2018  . Right foot pain 06/22/2018  . Acute left-sided low back pain with left-sided sciatica 06/22/2018  . Osteoarthritis 11/21/2017  . Hyperlipidemia 11/21/2017  . Low testosterone 09/26/2016  . Healthcare maintenance 09/26/2016  . S/P total knee replacement using cement, right 06/01/2016  . Sleep apnea 05/19/2016  . Hypertension 05/19/2016   Past Medical History:  Diagnosis Date  . Arthritis    back & L knee, R foot - lis franc   . Deep vein thrombosis ([place])    after knee surgery in 2004  . Hypertension   . Murmur, cardiac    pt. reports its difficult to auscultate   . Pneumonia    back in college days.   . Sleep apnea    CPAP- last study- 2014    Family History  Problem Relation Age of Onset  .  Alzheimer's disease Father   . Colon cancer Neg Hx   . Esophageal cancer Neg Hx   . Rectal cancer Neg Hx   . Stomach cancer Neg Hx     Past Surgical History:  Procedure Laterality Date  . ANTERIOR CERVICAL DECOMP/DISCECTOMY FUSION N/A 04/14/2020   Procedure: ANTERIOR CERVICAL DECOMPRESSION/DISCECTOMY FUSION, INTERBODY PROSTHESIS, PLATE/SCREWS CERVICAL THREE-FOUR,CERVICAL FOUR-FIVE,CERVICAL SIX-SEVEN;  Surgeon: Newman Pies, MD;  Location: [facility];  Service: Neurosurgery;  Laterality: N/A;  anterior  . CERVICAL SPINE SURGERY N/A 1990   C5-6 discectomy  . FINGER SURGERY Right 2008   x 3 index finger  . KNEE ARTHROPLASTY    . KNEE ARTHROSCOPY Bilateral    L 2004, R 2005  . MASS EXCISION Right 01/18/2017   Procedure: EXCISION BENIGN RIGHT NECK MASS;  Surgeon: Rozetta Nunnery, MD;  Location: [facility];  Service: ENT;  Laterality: Right;  . MASS EXCISION Bilateral 03/23/2019   Procedure: EXCISIONS OF RIGHT CHEEK LESION, RIGHT POSTERIOR NECK LIPOMA  AND LEFT NECK NODULE;  Surgeon: Rozetta Nunnery, MD;  Location: [facility];  Service: ENT;  Laterality: Bilateral;  . NASAL FRACTURE SURGERY    . 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 TOTAL KNEE ARTHROPLASTY Right 06/01/2016   Procedure: TOTAL KNEE ARTHROPLASTY;  Surgeon: [name], MD;  Location: [facility];  Service: Orthopedics;  Laterality: Right;  . TOTAL KNEE ARTHROPLASTY Left 07/25/2018   Procedure: LEFT TOTAL KNEE ARTHROPLASTY;  Surgeon: [name], MD;  Location: [facility];  Service: Orthopedics;  Laterality: Left;   Social History   Occupational History  . Not on file  Tobacco Use  . Smoking status: Never Smoker  . Smokeless tobacco: Never Used  Vaping Use  . Vaping Use: Never used  Substance and Sexual Activity  . Alcohol use: Yes    Comment: rarely  . Drug use: No  . Sexual activity: Not on file

## 2020-10-31 ENCOUNTER — Encounter: Payer: Self-pay | Admitting: Family Medicine

## 2020-11-18 ENCOUNTER — Encounter: Payer: 59 | Admitting: Family Medicine

## 2020-11-19 ENCOUNTER — Other Ambulatory Visit: Payer: Self-pay

## 2020-11-19 ENCOUNTER — Encounter: Payer: Self-pay | Admitting: Family Medicine

## 2020-11-19 ENCOUNTER — Ambulatory Visit (INDEPENDENT_AMBULATORY_CARE_PROVIDER_SITE_OTHER): Payer: 59 | Admitting: Family Medicine

## 2020-11-19 ENCOUNTER — Other Ambulatory Visit (HOSPITAL_COMMUNITY): Payer: Self-pay

## 2020-11-19 VITALS — BP 138/68 | HR 55 | Ht 67.5 in | Wt 219.8 lb

## 2020-11-19 DIAGNOSIS — E785 Hyperlipidemia, unspecified: Secondary | ICD-10-CM

## 2020-11-19 DIAGNOSIS — R7989 Other specified abnormal findings of blood chemistry: Secondary | ICD-10-CM | POA: Diagnosis not present

## 2020-11-19 DIAGNOSIS — R739 Hyperglycemia, unspecified: Secondary | ICD-10-CM | POA: Diagnosis not present

## 2020-11-19 DIAGNOSIS — I10 Essential (primary) hypertension: Secondary | ICD-10-CM | POA: Diagnosis not present

## 2020-11-19 DIAGNOSIS — E6609 Other obesity due to excess calories: Secondary | ICD-10-CM

## 2020-11-19 DIAGNOSIS — Z0000 Encounter for general adult medical examination without abnormal findings: Secondary | ICD-10-CM | POA: Diagnosis not present

## 2020-11-19 DIAGNOSIS — Z6839 Body mass index (BMI) 39.0-39.9, adult: Secondary | ICD-10-CM

## 2020-11-19 DIAGNOSIS — G4733 Obstructive sleep apnea (adult) (pediatric): Secondary | ICD-10-CM

## 2020-11-19 MED ORDER — LOSARTAN POTASSIUM-HCTZ 100-25 MG PO TABS
1.0000 | ORAL_TABLET | Freq: Every day | ORAL | 3 refills | Status: DC
  Filled 2020-11-19: qty 90, 90d supply, fill #0
  Filled 2021-03-10: qty 90, 90d supply, fill #1

## 2020-11-19 NOTE — Progress Notes (Signed)
Office Visit Note   Patient: James Gallegos           Date of Birth: Dec 12, 1950           MRN: [id number] Visit Date: 11/19/2020 Requested by: James Blase, MD [address] [place],  [place] [address] PCP: James Blase, MD  Subjective: Chief Complaint  Patient presents with  . Annual Exam    HPI: He is here for annual wellness exam.  Last time his blood sugar was elevated.  He decided to try intermittent fasting and lost over 30 pounds in 6 weeks.  His fasting blood sugars went from consistently over 150 down to consistently below 100.  He has since stopped being as consistent with his diet, but he is optimistic that he can manage things without medication.  Blood pressure also improved significantly with those lifestyle changes.  Today he has not yet taken his medication and his blood pressure is almost normal.  He is getting ready to have left elbow ulnar nerve transfer in the near future.  He struggles with arthritic pains in multiple joints, but he noted that those were better with proper eating as well.  Otherwise he is feeling well, no specific concerns.              ROS:   All other systems were reviewed and are negative.  Objective: Vital Signs: BP 138/68 (BP Location: Left Arm, Patient Position: Sitting, Cuff Size: Normal)   Pulse (!) 55   Ht 5' 7.5" (1.715 m)   Wt 219 lb 12.8 oz (99.7 kg)   BMI 33.92 kg/m   Physical Exam:  General:  Alert and oriented, in no acute distress. Pulm:  Breathing unlabored. Psy:  Normal mood, congruent affect. Skin: He has 3 nevi on his back, 1 left posterior shoulder and 2 in the left lower lumbar area which are slightly irregular/asymmetric but uniform in color.  Multiple seborrheic keratoses and cherry hemangiomas. HEENT:  [place]/AT, PERRLA, EOM Full, no nystagmus.  Funduscopic examination within normal limits.  No conjunctival erythema.  Tympanic membranes are pearly gray with normal landmarks.  External ear canals are normal.   Nasal passages are clear.  Oropharynx is clear.  No significant lymphadenopathy.  No thyromegaly or nodules.  2+ carotid pulses without bruits. CV: Regular rate and rhythm without murmurs, rubs, or gallops.  No peripheral edema.  2+ radial and posterior tibial pulses. Lungs: Clear to auscultation throughout with no wheezing or areas of consolidation. Abd: Bowel sounds are active, no hepatosplenomegaly or masses.  Soft and nontender.  No audible bruits.  No evidence of ascites.   Imaging: No results found.  Assessment & Plan: 1.  Wellness examination -He wants to wait several months before doing labs, and get back into his healthy lifestyle and diet.  2.  Hyperglycemia -Seems to be very controllable with lifestyle change.  3.  Hypertension - Refilled medication.  May be able to get off medication if he works on lifestyle change.  4.  Testosterone deficiency, doing well with topical treatment. - Labs in the near future.  Refilled medication.       Procedures: No procedures performed        PMFS History: Patient Active Problem List   Diagnosis Date Noted  . Neuropathy, 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  . Cervical spondylosis with myelopathy and radiculopathy 04/14/2020  . Impingement syndrome of right shoulder 10/10/2019  . Low back pain 10/10/2019  . Irritation of ulnar nerve, left 08/28/2019  . Class  2 obesity due to excess calories without serious comorbidity with body mass index (BMI) of 39.0 to 39.9 in adult 01/08/2019  . Pain in right elbow 12/06/2018  . Primary osteoarthritis of left knee 07/25/2018  . History of left knee replacement 07/25/2018  . Right foot pain 06/22/2018  . Acute left-sided low back pain with left-sided sciatica 06/22/2018  . Osteoarthritis 11/21/2017  . Hyperlipidemia 11/21/2017  . Low testosterone 09/26/2016  . Healthcare maintenance 09/26/2016  . S/P total knee replacement using cement, right 06/01/2016  . Sleep apnea 05/19/2016  . Hypertension  05/19/2016   Past Medical History:  Diagnosis Date  . Arthritis    back & L knee, R foot - lis franc   . Deep vein thrombosis ([place])    after knee surgery in 2004  . Hypertension   . Murmur, cardiac    pt. reports its difficult to auscultate   . Pneumonia    back in college days.   . Sleep apnea    CPAP- last study- 2014    Family History  Problem Relation Age of Onset  . Dementia Mother   . Alzheimer's disease Father   . Healthy Brother   . Colon cancer Neg Hx   . Esophageal cancer Neg Hx   . Rectal cancer Neg Hx   . Stomach cancer Neg Hx   . Cancer Neg Hx   . Diabetes Neg Hx     Past Surgical History:  Procedure Laterality Date  . ANTERIOR CERVICAL DECOMP/DISCECTOMY FUSION N/A 04/14/2020   Procedure: ANTERIOR CERVICAL DECOMPRESSION/DISCECTOMY FUSION, INTERBODY PROSTHESIS, PLATE/SCREWS CERVICAL THREE-FOUR,CERVICAL FOUR-FIVE,CERVICAL SIX-SEVEN;  Surgeon: Newman Pies, MD;  Location: [facility];  Service: Neurosurgery;  Laterality: N/A;  anterior  . CERVICAL SPINE SURGERY N/A 1990   C5-6 discectomy  . FINGER SURGERY Right 2008   x 3 index finger  . KNEE ARTHROPLASTY    . KNEE ARTHROSCOPY Bilateral    L 2004, R 2005  . MASS EXCISION Right 01/18/2017   Procedure: EXCISION BENIGN RIGHT NECK MASS;  Surgeon: Rozetta Nunnery, MD;  Location: [facility];  Service: ENT;  Laterality: Right;  . MASS EXCISION Bilateral 03/23/2019   Procedure: EXCISIONS OF RIGHT CHEEK LESION, RIGHT POSTERIOR NECK LIPOMA  AND LEFT NECK NODULE;  Surgeon: Rozetta Nunnery, MD;  Location: [facility];  Service: ENT;  Laterality: Bilateral;  . NASAL FRACTURE SURGERY    . 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 TOTAL KNEE ARTHROPLASTY Right 06/01/2016   Procedure: TOTAL KNEE ARTHROPLASTY;  Surgeon: [name], MD;  Location: [facility];  Service: Orthopedics;  Laterality: Right;  . TOTAL KNEE ARTHROPLASTY Left 07/25/2018   Procedure: LEFT TOTAL KNEE ARTHROPLASTY;   Surgeon: [name], MD;  Location: [facility];  Service: Orthopedics;  Laterality: Left;   Social History   Occupational History  . Not on file  Tobacco Use  . Smoking status: Never Smoker  . Smokeless tobacco: Never Used  Vaping Use  . Vaping Use: Never used  Substance and Sexual Activity  . Alcohol use: Yes    Comment: rarely  . Drug use: No  . Sexual activity: Not on file

## 2020-11-20 ENCOUNTER — Other Ambulatory Visit: Payer: Self-pay

## 2020-11-20 ENCOUNTER — Encounter (HOSPITAL_BASED_OUTPATIENT_CLINIC_OR_DEPARTMENT_OTHER): Payer: Self-pay | Admitting: Orthopaedic Surgery

## 2020-11-20 DIAGNOSIS — G5622 Lesion of ulnar nerve, left upper limb: Secondary | ICD-10-CM

## 2020-11-20 DIAGNOSIS — Z973 Presence of spectacles and contact lenses: Secondary | ICD-10-CM

## 2020-11-20 HISTORY — DX: Presence of spectacles and contact lenses: Z97.3

## 2020-11-20 HISTORY — DX: Lesion of ulnar nerve, left upper limb: G56.22

## 2020-11-20 NOTE — Progress Notes (Signed)
Spoke w/ via phone for pre-op interview---pt Lab needs dos----    I stat            Lab results------ekg 03-21-2020 epic COVID test -----patient states asymptomatic no test needed Arrive at -------830 am 11-25-2020 NPO after MN NO Solid Food.  Clear liquids from MN until---730 am then npo Med rec completed Medications to take morning of surgery -----none Diabetic medication -----n/a Patient instructed no nail polish to be worn day of surgery n/a Patient instructed to bring photo id and insurance card day of surgery Patient aware to have Driver (ride ) / caregiver   grandosn trey levan will stay  for 24 hours after surgery  Patient Special Instructions -----none Pre-Op special Istructions -----none Patient verbalized understanding of instructions that were given at this phone interview. Patient denies shortness of breath, chest pain, fever, cough at this phone interview.

## 2020-11-21 ENCOUNTER — Other Ambulatory Visit: Payer: Self-pay

## 2020-11-24 NOTE — H&P (Signed)
[name], MD   Biagio Borg, PA-C [address], [place], [place]  [address]                             [phone]   ORTHOPAEDIC HISTORY & PHYSICAL  James Gallegos MRN:  [id number] DOB/SEX:  1950-12-19/male  CHIEF COMPLAINT:  Painful left Knee  HISTORY: Patient is a 69 y.o. male presented with a history of pain in the left elbow pain with associated referred pain to ulnar 2 digits of the left hand. He has a positive tinel's and pain over ulnar nerve at the groove with palpation producing numbness and tingling into ulnar 2 digits.   EMG's performed and were determined to be normal.   Has failed conservative treatment.   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   Cervical spondylosis with myelopathy and radiculopathy 04/14/2020   Impingement syndrome of right shoulder 10/10/2019   Low back pain 10/10/2019   Irritation of ulnar nerve, left 08/28/2019   Class 2 obesity due to excess calories without serious comorbidity with body mass index (BMI) of 39.0 to 39.9 in adult 01/08/2019   Pain in right elbow 12/06/2018   Primary osteoarthritis of left knee 07/25/2018   History of left knee replacement 07/25/2018   Right foot pain 06/22/2018   Acute left-sided low back pain with left-sided sciatica 06/22/2018   Osteoarthritis 11/21/2017   Hyperlipidemia 11/21/2017   Low testosterone 09/26/2016   Healthcare maintenance 09/26/2016   S/P total knee replacement using cement, right 06/01/2016   Sleep apnea 05/19/2016   Hypertension 05/19/2016   Past Medical History:  Diagnosis Date   Arthritis    back , R foot -   Deep vein thrombosis ([place])    after knee surgery in 2004   GERD (gastroesophageal reflux disease)    diet controlled no meds per pt   Hypertension    Murmur, cardiac    pt. 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    Sleep apnea    CPAP- last study- 2014 cpap set on 13 per pt   Wears glasses 11/20/2020   for reading   Past Surgical History:  Procedure Laterality Date   ANTERIOR CERVICAL DECOMP/DISCECTOMY FUSION N/A 04/14/2020   Procedure: ANTERIOR CERVICAL DECOMPRESSION/DISCECTOMY FUSION, INTERBODY PROSTHESIS, PLATE/SCREWS CERVICAL THREE-FOUR,CERVICAL FOUR-FIVE,CERVICAL SIX-SEVEN;  Surgeon: Newman Pies, MD;  Location: [place];  Service: Neurosurgery;  Laterality: N/A;  anterior   CERVICAL SPINE SURGERY N/A 1990   C5-6 discectomy plates and screws in neck per pt   colonscopy  2017   polyps removed per pt follow up in 5 years   FINGER SURGERY Right 2008   x 3 index finger   KNEE ARTHROSCOPY Bilateral    L 2004, R 2005   MASS EXCISION Right 01/18/2017   Procedure: EXCISION BENIGN RIGHT NECK MASS;  Surgeon: Rozetta Nunnery, MD;  Location: [place];  Service: ENT;  Laterality: Right;   MASS EXCISION Bilateral 03/23/2019   Procedure: EXCISIONS OF RIGHT CHEEK LESION, RIGHT POSTERIOR NECK LIPOMA  AND LEFT NECK NODULE;  Surgeon: Rozetta Nunnery, MD;  Location: [facility];  Service: ENT;  Laterality: Bilateral;   NASAL FRACTURE SURGERY  1980   TOTAL KNEE ARTHROPLASTY Right 06/01/2016   Procedure: TOTAL KNEE ARTHROPLASTY;  Surgeon: [name], MD;  Location: [place];  Service: Orthopedics;  Laterality: Right;   TOTAL KNEE ARTHROPLASTY Left 07/25/2018   Procedure: LEFT TOTAL KNEE ARTHROPLASTY;  Surgeon: [name], MD;  Location: [place];  Service: Orthopedics;  Laterality: Left;     MEDICATIONS PRIOR TO ADMISSION: No current facility-administered medications for this encounter.  Current Outpatient Medications:    Acetylcysteine (N-ACETYL-L-CYSTEINE PO), Take 100 mg by mouth daily., Disp: , Rfl:    amoxicillin  (AMOXIL) 500 MG capsule, Take 2,000 mg by mouth See admin instructions. For dental procedure, Disp: , Rfl:    Ascorbic Acid (VITAMIN C) 1000 MG tablet, Take 5,000 mg by mouth daily., Disp: , Rfl:    b complex vitamins capsule, Take 1 capsule by mouth daily. B-100, Disp: , Rfl:    Cholecalciferol (VITAMIN D-3) 5000 units TABS, Take 5,000 Units by mouth daily., Disp: , Rfl:    Cyanocobalamin (VITAMIN B-12) 5000 MCG TBDP, Take 5,000 mcg by mouth daily., Disp: , Rfl:    DHEA 50 MG CAPS, Take 50 mg by mouth daily. , Disp: , Rfl:    Diindolylmethane POWD, Take 250 mg by mouth daily. Capsule, Disp: , Rfl:    fluticasone (FLONASE) 50 MCG/ACT nasal spray, Place 1 spray into both nostrils at bedtime as needed for allergies or rhinitis. , Disp: , Rfl:    Krill Oil 500 MG CAPS, Take 500 mg by mouth daily., Disp: , Rfl:    losartan-hydrochlorothiazide (HYZAAR) 100-25 MG tablet, Take 1 tablet by mouth daily., Disp: 90 tablet, Rfl: 3   Lysine HCl 500 MG TABS, Take 1,000 mg by mouth daily. , Disp: , Rfl:    Magnesium 400 MG TABS, Take 400 mg by mouth daily at 8 pm. , Disp: , Rfl:    Menaquinone-7 (VITAMIN K2 PO), Take 200 mcg by mouth daily., Disp: , Rfl:    PRESCRIPTION MEDICATION, Apply 1 application topically at bedtime. Testerone 13.5 % applied to shoulder [facility], Disp: , Rfl:    QUERCETIN PO, Take 1,000 mg by mouth daily., Disp: , Rfl:    Saw Palmetto, Serenoa repens, 320 MG CAPS, Take 320 mg by mouth daily., Disp: , Rfl:    zinc gluconate 50 MG tablet, Take 50 mg by mouth daily., Disp: , Rfl:    ALLERGIES:  No Known Allergies  REVIEW OF SYSTEMS:  Review of Systems  All other systems reviewed and are negative.  FAMILY HISTORY:   Family History  Problem Relation Age of Onset   Dementia Mother    Alzheimer's disease Father    Healthy Brother    Colon cancer Neg Hx    Esophageal cancer Neg Hx    Rectal cancer Neg Hx    Stomach cancer Neg Hx    Cancer Neg Hx    Diabetes Neg  Hx     SOCIAL HISTORY:   Social History   Occupational History   Not on file  Tobacco Use   Smoking status: Never   Smokeless tobacco: Never  Vaping Use   Vaping Use: Never used  Substance and Sexual Activity   Alcohol use: Yes    Comment: rarely wine   Drug use: Never   Sexual activity: Not on file     EXAMINATION:  Vital signs in last 24 hours: There were no vitals  taken for this visit.  Physical Exam Constitutional:      Appearance: Normal appearance.  HENT:     Head: Normocephalic.     Nose: Nose normal.  Eyes:     Pupils: Pupils are equal, round, and reactive to light.  Cardiovascular:     Rate and Rhythm: Normal rate and regular rhythm.     Pulses: Normal pulses.     Heart sounds: Normal heart sounds.  Pulmonary:     Effort: Pulmonary effort is normal.     Breath sounds: Normal breath sounds.  Abdominal:     General: Bowel sounds are normal.     Palpations: Abdomen is soft.  Skin:    General: Skin is warm and dry.  Neurological:     General: No focal deficit present.     Mental Status: He is alert and oriented to person, place, and time.  Psychiatric:        Mood and Affect: Mood normal.        Behavior: Behavior normal.        Thought Content: Thought content normal.        Judgment: Judgment normal.    Ortho Exam Tender at ulnar Full range of motion left elbow but definite positive Tinel's over the ulnar nerve.  Minimal discomfort.  No pain in Guyon's canal.  Good strength with AB duction and abduction of fingers.  Has a little altered sensibility in the ring finger and minimally in the little finger.  No carpal tunnel symptoms.  No pain with range of motion of the shouldernerve.   ASSESSMENT: Ulnar nerve entrapment left elbow Past Medical History:  Diagnosis Date   Arthritis    back , R foot -   Deep vein thrombosis ([place])    after knee surgery in 2004   GERD (gastroesophageal reflux disease)    diet controlled no meds per pt   Hypertension     Murmur, cardiac    pt. 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    Sleep apnea    CPAP- last study- 2014 cpap set on 13 per pt   Wears glasses 11/20/2020   for reading    PLAN: Plan for left ulnar nerve decompression  Aaron Edelman D. Micheal Likens [phone]  11/24/2020 1:57 PM

## 2020-11-24 NOTE — Progress Notes (Signed)
Anesthesia Chart Review: James Gallegos  Case: [id number] Date/Time: 11/25/20 1015   Procedure: ulnar nerve decompression left elbow (Left: Elbow)   Anesthesia type: Choice   Pre-op diagnosis: ulnar nerve neuropathy   Location: [facility] OR ROOM 05 / [facility]   Surgeons: [name], MD       DISCUSSION: Patient is a 70 year old male scheduled for the above procedure.   History includes never smoker, THN, OSA (CPAP), murmur (no murmur documented at his 05/17/16 preoperative cardiology evaluation prior to right TKA; no murmur documented 01/08/19 annual PCP exam), TKA (right 06/01/16; left 07/25/18), DVT (post-knee arthroscopy 2004), right check seborrheic keratosis lesion & left neck sebaceous cyst excisions (03/23/19), c-spine surgery (1990; C3-5, C6-7 ACDF 04/14/20), obesity.   Last saw PCP Dr. Junius Roads on 11/19/20. He is aware of surgery plans.    Preoperative COVID-19 test negative on 04/10/2020.  Anesthesia team to evaluate on the day of surgery.    VS:  BP Readings from Last 3 Encounters:  11/19/20 138/68  04/15/20 (!) 130/57  04/10/20 140/65   Pulse Readings from Last 3 Encounters:  11/19/20 (!) 55  04/15/20 (!) 54  04/10/20 (!) 58     PROVIDERS: Eunice Blase, MD is PCP. Last annual exam 11/19/20.  - He is not routinely followed by cardiology, but had a preoperative cardiac evaluation by Neldon Labella, NP/Ganji, Ulice Dash, MD in 2017 and had a non-ischemic stress test.     LABS: For day of surgery as indicated. Glucose 135, Normal CBC and Creatinine on 04/10/20.    EKG: 04/10/20: Sinus bradycardia at 53 bpm Cannot rule out Anterior infarct , age undetermined Abnormal ECG since last tracing no significant change Confirmed by Larae Grooms [phone]) on 04/10/2020 11:42:43 AM     CV: Nuclear stress test 05/20/16 [facility]; scanned under Media tab 06/01/16):   Impression: 1.  The resting ECG demonstrates NSR, normal resting conduction and no resting arrhythmias.   Stress ECG is nondiagnostic for ischemia as it is a pharmacologic stress using Lexiscan.  Stress symptoms included dyspnea. 2.  Myocardial perfusion imaging is normal. Overall LV systolic function was normal without regional wall motion abnormalities. LVEF 69%. This is a normal/low risk study.   Past Medical History:  Diagnosis Date   Arthritis    back , R foot -   Deep vein thrombosis ([place])    after knee surgery in 2004   GERD (gastroesophageal reflux disease)    diet controlled no meds per pt   Hypertension    Murmur, cardiac    pt. 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    Sleep apnea    CPAP- last study- 2014 cpap set on 13 per pt   Wears glasses 11/20/2020   for reading    Past Surgical History:  Procedure Laterality Date   ANTERIOR CERVICAL DECOMP/DISCECTOMY FUSION N/A 04/14/2020   Procedure: ANTERIOR CERVICAL DECOMPRESSION/DISCECTOMY FUSION, INTERBODY PROSTHESIS, PLATE/SCREWS CERVICAL THREE-FOUR,CERVICAL FOUR-FIVE,CERVICAL SIX-SEVEN;  Surgeon: Newman Pies, MD;  Location: [facility];  Service: Neurosurgery;  Laterality: N/A;  anterior   CERVICAL SPINE SURGERY N/A 1990   C5-6 discectomy plates and screws in neck per pt   colonscopy  2017   polyps removed per pt follow up in 5 years   FINGER  SURGERY Right 2008   x 3 index finger   KNEE ARTHROSCOPY Bilateral    L 2004, R 2005   MASS EXCISION Right 01/18/2017   Procedure: EXCISION BENIGN RIGHT NECK MASS;  Surgeon: Rozetta Nunnery, MD;  Location: [facility] [place];  Service: ENT;  Laterality: Right;   MASS EXCISION Bilateral 03/23/2019   Procedure: EXCISIONS OF RIGHT CHEEK LESION, RIGHT POSTERIOR NECK LIPOMA  AND LEFT NECK NODULE;  Surgeon: Rozetta Nunnery, MD;  Location: [place];  Service: ENT;   Laterality: Bilateral;   [place] Right 06/01/2016   Procedure: TOTAL KNEE ARTHROPLASTY;  Surgeon: [name], MD;  Location: [facility] [place];  Service: Orthopedics;  Laterality: Right;   TOTAL KNEE ARTHROPLASTY Left 07/25/2018   Procedure: LEFT TOTAL KNEE ARTHROPLASTY;  Surgeon: [name], MD;  Location: [place];  Service: Orthopedics;  Laterality: Left;    MEDICATIONS: No current facility-administered medications for this encounter.    Acetylcysteine (N-ACETYL-L-CYSTEINE PO)   amoxicillin (AMOXIL) 500 MG capsule   Ascorbic Acid (VITAMIN C) 1000 MG tablet   b complex vitamins capsule   Cholecalciferol (VITAMIN D-3) 5000 units TABS   Cyanocobalamin (VITAMIN B-12) 5000 MCG TBDP   DHEA 50 MG CAPS   Diindolylmethane POWD   fluticasone (FLONASE) 50 MCG/ACT nasal spray   Krill Oil 500 MG CAPS   losartan-hydrochlorothiazide (HYZAAR) 100-25 MG tablet   Lysine HCl 500 MG TABS   Magnesium 400 MG TABS   Menaquinone-7 (VITAMIN K2 PO)   PRESCRIPTION MEDICATION   QUERCETIN PO   Saw Palmetto, Serenoa repens, 320 MG CAPS   zinc gluconate 50 MG tablet    Myra Gianotti, PA-C Surgical Short Stay/Anesthesiology [facility] Phone [phone] [facility] Phone [phone] 11/24/2020 4:53 PM

## 2020-11-24 NOTE — Anesthesia Preprocedure Evaluation (Addendum)
Anesthesia Evaluation  Patient identified by MRN, date of birth, ID band Patient awake    Reviewed: Allergy & Precautions, NPO status , Patient's Chart, lab work & pertinent test results  Airway Mallampati: III  TM Distance: >3 FB Neck ROM: Limited    Dental no notable dental hx. (+) Teeth Intact, Dental Advisory Given   Pulmonary sleep apnea and Continuous Positive Airway Pressure Ventilation ,    Pulmonary exam normal breath sounds clear to auscultation       Cardiovascular hypertension (183/79 in preop, usually 130-140s SBP ), Pt. on medications + DVT (postop 2004)  Normal cardiovascular exam Rhythm:Regular Rate:Normal     Neuro/Psych  Neuromuscular disease (ulnar neuropathy L hand) negative psych ROS   GI/Hepatic Neg liver ROS, GERD  Controlled,  Endo/Other  negative endocrine ROS  Renal/GU negative Renal ROS  negative genitourinary   Musculoskeletal  (+) Arthritis , Osteoarthritis,  Neuropathy L wrist   Abdominal (+) + obese,   Peds  Hematology negative hematology ROS (+)   Anesthesia Other Findings   Reproductive/Obstetrics negative OB ROS                           Anesthesia Physical Anesthesia Plan  ASA: 3  Anesthesia Plan: MAC and Regional   Post-op Pain Management:    Induction:   PONV Risk Score and Plan: 2 and Propofol infusion and TIVA  Airway Management Planned: Natural Airway and Simple Face Mask  Additional Equipment: None  Intra-op Plan:   Post-operative Plan:   Informed Consent: I have reviewed the patients History and Physical, chart, labs and discussed the procedure including the risks, benefits and alternatives for the proposed anesthesia with the patient or authorized representative who has indicated his/her understanding and acceptance.       Plan Discussed with: CRNA  Anesthesia Plan Comments: (D/w pt minimal sedation given high CPAP settings,  non-reassuring airway- pt understands )      Anesthesia Quick Evaluation

## 2020-11-24 NOTE — Progress Notes (Signed)
Unable to reach patient at all numbers.  Left message for patient to arrive to short stay at 0800 and to have nothing to eat after midnight including gum and candy.  Informed patient that he could have clear liquids until 0730 on DOS.     Anesthesia review: yes; called Karoline Caldwell

## 2020-11-25 ENCOUNTER — Encounter (HOSPITAL_COMMUNITY): Payer: Self-pay | Admitting: Orthopaedic Surgery

## 2020-11-25 ENCOUNTER — Other Ambulatory Visit (HOSPITAL_COMMUNITY): Payer: Self-pay

## 2020-11-25 ENCOUNTER — Ambulatory Visit (HOSPITAL_COMMUNITY): Payer: 59 | Admitting: Anesthesiology

## 2020-11-25 ENCOUNTER — Other Ambulatory Visit: Payer: Self-pay

## 2020-11-25 ENCOUNTER — Encounter (HOSPITAL_COMMUNITY)
Admission: RE | Disposition: A | Discharge status: Home / Self Care | Payer: Self-pay | Source: Home / Self Care | Attending: Orthopaedic Surgery

## 2020-11-25 ENCOUNTER — Ambulatory Visit (HOSPITAL_COMMUNITY)
Admission: RE | Admit: 2020-11-25 | Discharge: 2020-11-25 | Disposition: A | Discharge status: Home / Self Care | Payer: 59 | Attending: Orthopaedic Surgery | Admitting: Orthopaedic Surgery

## 2020-11-25 DIAGNOSIS — Z79899 Other long term (current) drug therapy: Secondary | ICD-10-CM | POA: Insufficient documentation

## 2020-11-25 DIAGNOSIS — E785 Hyperlipidemia, unspecified: Secondary | ICD-10-CM | POA: Diagnosis not present

## 2020-11-25 DIAGNOSIS — G5622 Lesion of ulnar nerve, left upper limb: Secondary | ICD-10-CM | POA: Diagnosis not present

## 2020-11-25 DIAGNOSIS — Z96653 Presence of artificial knee joint, bilateral: Secondary | ICD-10-CM | POA: Diagnosis not present

## 2020-11-25 DIAGNOSIS — M4722 Other spondylosis with radiculopathy, cervical region: Secondary | ICD-10-CM | POA: Diagnosis not present

## 2020-11-25 DIAGNOSIS — M4712 Other spondylosis with myelopathy, cervical region: Secondary | ICD-10-CM | POA: Diagnosis not present

## 2020-11-25 HISTORY — PX: ULNAR NERVE TRANSPOSITION: SHX2595

## 2020-11-25 LAB — POCT I-STAT, CHEM 8
BUN: 18 mg/dL (ref 8–23)
Calcium, Ion: 1.04 mmol/L — ABNORMAL LOW (ref 1.15–1.40)
Chloride: 107 mmol/L (ref 98–111)
Creatinine, Ser: 0.8 mg/dL (ref 0.61–1.24)
Glucose, Bld: 188 mg/dL — ABNORMAL HIGH (ref 70–99)
HCT: 43 % (ref 39.0–52.0)
Hemoglobin: 14.6 g/dL (ref 13.0–17.0)
Potassium: 3.9 mmol/L (ref 3.5–5.1)
Sodium: 139 mmol/L (ref 135–145)
TCO2: 22 mmol/L (ref 22–32)

## 2020-11-25 SURGERY — ULNAR NERVE DECOMPRESSION/TRANSPOSITION
Anesthesia: Monitor Anesthesia Care | Site: Elbow | Laterality: Left

## 2020-11-25 MED ORDER — OXYCODONE HCL 5 MG PO TABS: 5.0000 mg | ORAL_TABLET | Freq: Once | ORAL | Status: DC | PRN

## 2020-11-25 MED ORDER — LACTATED RINGERS IV SOLN: INTRAVENOUS | Status: DC

## 2020-11-25 MED ORDER — ACETAMINOPHEN 500 MG PO TABS
1000.0000 mg | ORAL_TABLET | Freq: Once | ORAL | Status: AC
  Administered 2020-11-25: 1000 mg via ORAL
  Filled 2020-11-25: qty 2

## 2020-11-25 MED ORDER — CEFAZOLIN SODIUM-DEXTROSE 2-4 GM/100ML-% IV SOLN
2.0000 g | INTRAVENOUS | Status: AC
  Administered 2020-11-25: 2 g via INTRAVENOUS
  Filled 2020-11-25: qty 100

## 2020-11-25 MED ORDER — BUPIVACAINE-EPINEPHRINE (PF) 0.25% -1:200000 IJ SOLN
INTRAMUSCULAR | Status: AC
  Filled 2020-11-25: qty 30

## 2020-11-25 MED ORDER — FENTANYL CITRATE (PF) 100 MCG/2ML IJ SOLN: 50.0000 ug | Freq: Once | INTRAMUSCULAR | Status: AC

## 2020-11-25 MED ORDER — BUPIVACAINE HCL (PF) 0.5 % IJ SOLN
INTRAMUSCULAR | Status: DC | PRN
  Administered 2020-11-25: 25 mL via PERINEURAL

## 2020-11-25 MED ORDER — MIDAZOLAM HCL 2 MG/2ML IJ SOLN
INTRAMUSCULAR | Status: AC
  Administered 2020-11-25: 1 mg via INTRAVENOUS
  Filled 2020-11-25: qty 2

## 2020-11-25 MED ORDER — SODIUM CHLORIDE 0.9 % IV SOLN: INTRAVENOUS | Status: DC

## 2020-11-25 MED ORDER — OXYCODONE HCL 5 MG PO TABS
5.0000 mg | ORAL_TABLET | ORAL | 0 refills | Status: DC | PRN
  Filled 2020-11-25: qty 30, 5d supply, fill #0

## 2020-11-25 MED ORDER — PHENYLEPHRINE HCL-NACL 10-0.9 MG/250ML-% IV SOLN
INTRAVENOUS | Status: AC
  Filled 2020-11-25: qty 250

## 2020-11-25 MED ORDER — FENTANYL CITRATE (PF) 100 MCG/2ML IJ SOLN
INTRAMUSCULAR | Status: AC
  Administered 2020-11-25: 50 ug via INTRAVENOUS
  Filled 2020-11-25: qty 2

## 2020-11-25 MED ORDER — PROPOFOL 500 MG/50ML IV EMUL
INTRAVENOUS | Status: DC | PRN
  Administered 2020-11-25: 50 ug/kg/min via INTRAVENOUS

## 2020-11-25 MED ORDER — POVIDONE-IODINE 7.5 % EX SOLN
Freq: Once | CUTANEOUS | Status: DC
  Filled 2020-11-25: qty 118

## 2020-11-25 MED ORDER — ORAL CARE MOUTH RINSE: 15.0000 mL | Freq: Once | OROMUCOSAL | Status: AC

## 2020-11-25 MED ORDER — DEXAMETHASONE SODIUM PHOSPHATE 10 MG/ML IJ SOLN
INTRAMUSCULAR | Status: DC | PRN
  Administered 2020-11-25: 10 mg

## 2020-11-25 MED ORDER — SODIUM CHLORIDE 0.9 % IR SOLN
Status: DC | PRN
  Administered 2020-11-25: 1000 mL

## 2020-11-25 MED ORDER — MIDAZOLAM HCL 2 MG/2ML IJ SOLN
INTRAMUSCULAR | Status: AC
  Filled 2020-11-25: qty 2

## 2020-11-25 MED ORDER — DEXAMETHASONE SODIUM PHOSPHATE 10 MG/ML IJ SOLN: INTRAMUSCULAR | Status: DC | PRN

## 2020-11-25 MED ORDER — PROPOFOL 10 MG/ML IV BOLUS
INTRAVENOUS | Status: DC | PRN
  Administered 2020-11-25: 30 mg via INTRAVENOUS

## 2020-11-25 MED ORDER — PROPOFOL 10 MG/ML IV BOLUS
INTRAVENOUS | Status: AC
  Filled 2020-11-25: qty 20

## 2020-11-25 MED ORDER — HYDROMORPHONE HCL 1 MG/ML IJ SOLN: 0.2500 mg | INTRAMUSCULAR | Status: DC | PRN

## 2020-11-25 MED ORDER — OXYCODONE HCL 5 MG/5ML PO SOLN: 5.0000 mg | Freq: Once | ORAL | Status: DC | PRN

## 2020-11-25 MED ORDER — FENTANYL CITRATE (PF) 250 MCG/5ML IJ SOLN
INTRAMUSCULAR | Status: AC
  Filled 2020-11-25: qty 5

## 2020-11-25 MED ORDER — ONDANSETRON HCL 4 MG/2ML IJ SOLN: 4.0000 mg | Freq: Once | INTRAMUSCULAR | Status: DC | PRN

## 2020-11-25 MED ORDER — MIDAZOLAM HCL 2 MG/2ML IJ SOLN: 1.0000 mg | Freq: Once | INTRAMUSCULAR | Status: AC

## 2020-11-25 MED ORDER — CHLORHEXIDINE GLUCONATE 0.12 % MT SOLN
15.0000 mL | Freq: Once | OROMUCOSAL | Status: AC
  Administered 2020-11-25: 15 mL via OROMUCOSAL
  Filled 2020-11-25: qty 15

## 2020-11-25 SURGICAL SUPPLY — 45 items
BNDG CMPR 9X4 STRL LF SNTH (GAUZE/BANDAGES/DRESSINGS) ×1
BNDG COHESIVE 4X5 TAN STRL (GAUZE/BANDAGES/DRESSINGS) ×2 IMPLANT
BNDG ELASTIC 4X5.8 VLCR STR LF (GAUZE/BANDAGES/DRESSINGS) ×3 IMPLANT
BNDG ESMARK 4X9 LF (GAUZE/BANDAGES/DRESSINGS) ×2 IMPLANT
COVER SURGICAL LIGHT HANDLE (MISCELLANEOUS) ×2 IMPLANT
COVER WAND RF STERILE (DRAPES) ×2 IMPLANT
DECANTER SPIKE VIAL GLASS SM (MISCELLANEOUS) ×2 IMPLANT
DRAPE OEC MINIVIEW 54X84 (DRAPES) IMPLANT
DRSG ADAPTIC 3X8 NADH LF (GAUZE/BANDAGES/DRESSINGS) ×1 IMPLANT
DRSG EMULSION OIL 3X3 NADH (GAUZE/BANDAGES/DRESSINGS) ×2 IMPLANT
DURAPREP 26ML APPLICATOR (WOUND CARE) ×2 IMPLANT
ELECT REM PT RETURN 9FT ADLT (ELECTROSURGICAL) ×2
ELECTRODE REM PT RTRN 9FT ADLT (ELECTROSURGICAL) ×1 IMPLANT
GAUZE SPONGE 4X4 12PLY STRL (GAUZE/BANDAGES/DRESSINGS) ×2 IMPLANT
GAUZE SPONGE 4X4 12PLY STRL LF (GAUZE/BANDAGES/DRESSINGS) ×1 IMPLANT
GLOVE ECLIPSE 8.0 STRL XLNG CF (GLOVE) ×2 IMPLANT
GLOVE SRG 8 PF TXTR STRL LF DI (GLOVE) ×1 IMPLANT
GLOVE SURG UNDER POLY LF SZ8 (GLOVE) ×2
GOWN STRL REUS W/ TWL LRG LVL3 (GOWN DISPOSABLE) ×3 IMPLANT
GOWN STRL REUS W/TWL LRG LVL3 (GOWN DISPOSABLE) ×6
KIT BASIN OR (CUSTOM PROCEDURE TRAY) ×2 IMPLANT
KIT TURNOVER KIT B (KITS) ×2 IMPLANT
MANIFOLD NEPTUNE II (INSTRUMENTS) ×2 IMPLANT
NS IRRIG 1000ML POUR BTL (IV SOLUTION) ×2 IMPLANT
PACK ORTHO EXTREMITY (CUSTOM PROCEDURE TRAY) ×2 IMPLANT
PAD ARMBOARD 7.5X6 YLW CONV (MISCELLANEOUS) ×4 IMPLANT
PAD CAST 4YDX4 CTTN HI CHSV (CAST SUPPLIES) ×2 IMPLANT
PADDING CAST ABS 4INX4YD NS (CAST SUPPLIES) ×1
PADDING CAST ABS COTTON 4X4 ST (CAST SUPPLIES) IMPLANT
PADDING CAST COTTON 4X4 STRL (CAST SUPPLIES) ×4
PENCIL BUTTON HOLSTER BLD 10FT (ELECTRODE) IMPLANT
SPONGE LAP 4X18 RFD (DISPOSABLE) ×4 IMPLANT
STAPLER VISISTAT 35W (STAPLE) ×2 IMPLANT
STOCKINETTE IMPERVIOUS 9X36 MD (GAUZE/BANDAGES/DRESSINGS) ×2 IMPLANT
SUCTION FRAZIER HANDLE 10FR (MISCELLANEOUS) ×2
SUCTION TUBE FRAZIER 10FR DISP (MISCELLANEOUS) ×1 IMPLANT
SUT VIC AB 0 CT1 27 (SUTURE) ×4
SUT VIC AB 0 CT1 27XBRD ANBCTR (SUTURE) ×2 IMPLANT
SUT VIC AB 2-0 CTB1 (SUTURE) ×4 IMPLANT
TOWEL GREEN STERILE (TOWEL DISPOSABLE) ×2 IMPLANT
TOWEL GREEN STERILE FF (TOWEL DISPOSABLE) ×2 IMPLANT
TUBE CONNECTING 12X1/4 (SUCTIONS) ×2 IMPLANT
UNDERPAD 30X36 HEAVY ABSORB (UNDERPADS AND DIAPERS) ×2 IMPLANT
WATER STERILE IRR 1000ML POUR (IV SOLUTION) ×2 IMPLANT
YANKAUER SUCT BULB TIP NO VENT (SUCTIONS) ×2 IMPLANT

## 2020-11-25 NOTE — Anesthesia Procedure Notes (Signed)
Anesthesia Regional Block: Supraclavicular block   Pre-Anesthetic Checklist: , timeout performed,  Correct Patient, Correct Site, Correct Laterality,  Correct Procedure, Correct Position, site marked,  Risks and benefits discussed,  Surgical consent,  Pre-op evaluation,  At surgeon's request and post-op pain management  Laterality: Left  Prep: Maximum Sterile Barrier Precautions used, chloraprep       Needles:  Injection technique: Single-shot  Needle Type: Echogenic Stimulator Needle     Needle Length: 9cm  Needle Gauge: 22     Additional Needles:   Procedures:,,,, ultrasound used (permanent image in chart),,    Narrative:  Start time: 11/25/2020 9:25 AM End time: 11/25/2020 9:30 AM Injection made incrementally with aspirations every 5 mL.  Performed by: Personally  Anesthesiologist: Pervis Hocking, DO  Additional Notes: Monitors applied. No increased pain on injection. No increased resistance to injection. Injection made in 5cc increments. Good needle visualization. Patient tolerated procedure well.

## 2020-11-25 NOTE — Anesthesia Procedure Notes (Addendum)
Procedure Name: MAC Date/Time: 11/25/2020 10:30 AM Performed by: Michele Rockers, CRNA Pre-anesthesia Checklist: Patient identified, Emergency Drugs available, Suction available, Timeout performed and Patient being monitored Patient Re-evaluated:Patient Re-evaluated prior to induction Oxygen Delivery Method: Nasal Cannula

## 2020-11-25 NOTE — Op Note (Signed)
0000

## 2020-11-25 NOTE — Anesthesia Postprocedure Evaluation (Signed)
Anesthesia Post Note  Patient: James Gallegos  Procedure(s) Performed: ulnar nerve decompression left elbow (Left: Elbow)     Patient location during evaluation: PACU Anesthesia Type: Regional and MAC Level of consciousness: awake and alert Pain management: pain level controlled Vital Signs Assessment: post-procedure vital signs reviewed and stable Respiratory status: spontaneous breathing, nonlabored ventilation and respiratory function stable Cardiovascular status: blood pressure returned to baseline and stable Postop Assessment: no apparent nausea or vomiting Anesthetic complications: no   No notable events documented.  Last Vitals:  Vitals:   11/25/20 1156 11/25/20 1221  BP: (!) 153/72 (!) 156/66  Pulse: 70 73  Resp: 18 15  Temp:    SpO2: 99% 96%    Last Pain:  Vitals:   11/25/20 1221  TempSrc:   PainSc: 0-No pain                 Pervis Hocking

## 2020-11-25 NOTE — Progress Notes (Signed)
Pt. Spouse is coming to take pt. Home today.

## 2020-11-25 NOTE — H&P (Signed)
The recent History & Physical has been reviewed. I have personally examined the patient today. There is no interval change to the documented History & Physical. The patient would like to proceed with the procedure.  [name] 11/25/2020,  9:55 AM

## 2020-11-25 NOTE — Transfer of Care (Signed)
Immediate Anesthesia Transfer of Care Note  Patient: James Gallegos  Procedure(s) Performed: ulnar nerve decompression left elbow (Left: Elbow)  Patient Location: PACU  Anesthesia Type:MAC  Level of Consciousness: awake, alert , patient cooperative and responds to stimulation  Airway & Oxygen Therapy: Patient Spontanous Breathing  Post-op Assessment: Report given to RN and Post -op Vital signs reviewed and stable  Post vital signs: Reviewed and stable  Last Vitals:  Vitals Value Taken Time  BP    Temp 36.7 C 11/25/20 1141  Pulse 71 11/25/20 1143  Resp 16 11/25/20 1143  SpO2 99 % 11/25/20 1143  Vitals shown include unvalidated device data.  Last Pain:  Vitals:   11/25/20 0835  TempSrc:   PainSc: 3       Patients Stated Pain Goal: 3 (01/77/93 9030)  Complications: No notable events documented.

## 2020-11-25 NOTE — Op Note (Signed)
NAME: ANGELICA, WIX MEDICAL RECORD NO: [id number] ACCOUNT NO: [id number] DATE OF BIRTH: 04/22/1951 FACILITY: [facility] LOCATION: [facility] PHYSICIAN: Vonna Kotyk. Durward Fortes, MD  Operative Report   DATE OF PROCEDURE: 11/25/2020  PREOPERATIVE DIAGNOSIS:  Ulnar nerve compression, left elbow.  POSTOPERATIVE DIAGNOSIS:  Ulnar nerve compression, left elbow.  PROCEDURE: Decompression of ulnar nerve, left elbow.  SURGEON:  Vonna Kotyk. Durward Fortes, MD  ASSISTANT:  Biagio Borg, PA-C  ANESTHESIA:  Interscalene nerve block with IV sedation.  COMPLICATIONS:  None.  HISTORY:  A 70 year old gentleman who has been experiencing numbness and tingling into the ulnar two digits of his left hand for months.  He has had EMGs and nerve conduction studies that were negative for obvious compression or neuropathy, but does have  a positive Tinel's over the nerve with pain.  Despite poor response to conservative treatment, he is now to have decompression of the ulnar nerve.  DESCRIPTION OF PROCEDURE:  The patient was met in the holding area, identified the left elbow as appropriate operative site and marked it accordingly.  Anesthesia performed an interscalene nerve block without complication.  The patient was then transported to room #5.  Under IV sedation, the left upper extremity was placed in an arm tourniquet.  The arm was then prepped with DuraPrep from the tourniquet to the wrist.  Sterile draping was performed.  Timeout was called.  Skin incision was outlined along the ulnar nerve about 2 inches in length beginning just proximal to the nerve canal extending distally.  Via sharp dissection, incision was carried down to the subcutaneous tissue.  The arm was then elevated and Esmarch  exsanguinated with proximal tourniquet at 250 mmHg.  The incision was then made as outlined and via sharp dissection carried down to subcutaneous tissue.  I could palpate the ulnar nerve in its groove.  With careful blunt  dissection, the soft tissue was removed from the nerve starting proximally extending  distally to the muscle.  The first motor branch was identified and carefully decompressed.  There was considerable cicatrix and scar running transversely within its groove, which appeared to compress the nerve.  I thought he had excellent decompression  at that point from its proximal to distal extent in the groove over an area of about 2 inches.  I did not see any inflammatory tissue within the bed and left it in place.  The wound was irrigated with saline solution.  The wound was then  closed with 3-0 Vicryls and skin was closed with skin clips.  Sterile bulky dressing was applied followed by a posterior fiberglass splint and a sling.  Tourniquet was released with immediate capillary refill to the fingers.  The patient tolerated the procedure without complications.  PLAN:  Oxycodone for pain.  Office one week.   [facility] D: 11/25/2020 11:20:58 am T: 11/25/2020 8:04:00 pm  JOB: [id number]/ [id number]

## 2020-11-25 NOTE — Op Note (Addendum)
PATIENT ID:      James Gallegos  MRN:     [id number] DOB/AGE:    [birth year] / 70 y.o.       OPERATIVE REPORT    DATE OF PROCEDURE:  11/25/2020       PREOPERATIVE DIAGNOSIS:   ulnar nerve compression left elbow                                                       Estimated body mass index is 33.79 kg/m as calculated from the following:   Height as of this encounter: 5' 7.5" (1.715 m).   Weight as of this encounter: 99.3 kg.     POSTOPERATIVE DIAGNOSIS:  same                                                                     Estimated body mass index is 33.79 kg/m as calculated from the following:   Height as of this encounter: 5' 7.5" (1.715 m).   Weight as of this encounter: 99.3 kg.     PROCEDURE:  Procedure(s): ulnar nerve decompression left elbow      SURGEON:  [name], MD    ASSISTANT:   Biagio Borg, PA-C   (Present and scrubbed throughout the case, critical for assistance with exposure, retraction, instrumentation, and closure.)          ANESTHESIA: regional and IV sedation     DRAINS: none :      TOURNIQUET TIME: * Missing tourniquet times found for documented tourniquets in log: [id number] *    COMPLICATIONS:  None   CONDITION:  stable  PROCEDURE IN DETAIL: [id number]   James Gallegos 11/25/2020, 11:15 AM

## 2020-11-26 ENCOUNTER — Encounter (HOSPITAL_COMMUNITY): Payer: Self-pay | Admitting: Orthopaedic Surgery

## 2020-11-28 ENCOUNTER — Encounter: Payer: Self-pay | Admitting: Orthopaedic Surgery

## 2020-12-02 ENCOUNTER — Encounter: Payer: 59 | Admitting: Family Medicine

## 2020-12-03 ENCOUNTER — Encounter: Payer: Self-pay | Admitting: Orthopaedic Surgery

## 2020-12-03 ENCOUNTER — Other Ambulatory Visit: Payer: Self-pay

## 2020-12-03 ENCOUNTER — Ambulatory Visit (INDEPENDENT_AMBULATORY_CARE_PROVIDER_SITE_OTHER): Payer: 59 | Admitting: Orthopaedic Surgery

## 2020-12-03 VITALS — Wt 219.0 lb

## 2020-12-03 DIAGNOSIS — G5622 Lesion of ulnar nerve, left upper limb: Secondary | ICD-10-CM

## 2020-12-03 NOTE — Progress Notes (Signed)
Office Visit Note   Patient: James Gallegos           Date of Birth: 01-28-1951           MRN: [id number] Visit Date: 12/03/2020              Requested by: Eunice Blase, MD [address] [place],  [place] [address] PCP: Eunice Blase, MD   Assessment & Plan: Visit Diagnoses:  1. Irritation of ulnar nerve, left     Plan: 8 days status post decompression ulnar nerve left elbow and doing well.  He notes that he is "at least 10 times better" than he was before surgery with little if any pain and most of the sensation has returned to the ulnar 2 digits.  Staples removed and Steri-Strips were applied.  He will begin range of motion exercises and be careful about the incision.  He can now get them wet.  Office 2-week  Follow-Up Instructions: Return in about 2 weeks (around 12/17/2020).   Orders:  No orders of the defined types were placed in this encounter.  No orders of the defined types were placed in this encounter.     Procedures: No procedures performed   Clinical Data: No additional findings.   Subjective: Chief Complaint  Patient presents with   Left Elbow - Follow-up, Routine Post Op    Left ulnar nerve decompression 11/25/2020  Patient presents today for follow up on his left elbow. He is now 8 days out from a left ulnar nerve decompression. His surgery was on 11/25/2020. Patient states that he is doing well.   HPI  Review of Systems   Objective: Vital Signs: Wt 219 lb (99.3 kg)   BMI 33.79 kg/m   Physical Exam  Ortho Exam awake alert and comfortable.  No acute distress.  No pain in the area of his 2 inch incision along the medial aspect of the left elbow.  No redness or evidence of infection.  Normal AB duction and ad duction of the fingers.  Good capillary refill.  Specialty Comments:  No specialty comments available.  Imaging: No results found.   PMFS History: Patient Active Problem List   Diagnosis Date Noted   Neuropathy, ulnar nerve  09/02/2020   Cervical spondylosis with myelopathy and radiculopathy 04/14/2020   Impingement syndrome of right shoulder 10/10/2019   Low back pain 10/10/2019   Irritation of ulnar nerve, left 08/28/2019   Class 2 obesity due to excess calories without serious comorbidity with body mass index (BMI) of 39.0 to 39.9 in adult 01/08/2019   Pain in right elbow 12/06/2018   Primary osteoarthritis of left knee 07/25/2018   History of left knee replacement 07/25/2018   Right foot pain 06/22/2018   Acute left-sided low back pain with left-sided sciatica 06/22/2018   Osteoarthritis 11/21/2017   Hyperlipidemia 11/21/2017   Low testosterone 09/26/2016   Healthcare maintenance 09/26/2016   S/P total knee replacement using cement, right 06/01/2016   Sleep apnea 05/19/2016   Hypertension 05/19/2016   Past Medical History:  Diagnosis Date   Arthritis    back , R foot -   Deep vein thrombosis ([place])    after knee surgery in 2004   GERD (gastroesophageal reflux disease)    diet controlled no meds per pt   Hypertension    Murmur, cardiac    pt. 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    Sleep apnea    CPAP- last study- 2014 cpap set on 13 per pt   Wears glasses 11/20/2020   for reading    Family History  Problem Relation Age of Onset   Dementia Mother    Alzheimer's disease Father    Healthy Brother    Colon cancer Neg Hx    Esophageal cancer Neg Hx    Rectal cancer Neg Hx    Stomach cancer Neg Hx    Cancer Neg Hx    Diabetes Neg Hx     Past Surgical History:  Procedure Laterality Date   ANTERIOR CERVICAL DECOMP/DISCECTOMY FUSION N/A 04/14/2020   Procedure: ANTERIOR CERVICAL DECOMPRESSION/DISCECTOMY FUSION, INTERBODY PROSTHESIS, PLATE/SCREWS CERVICAL THREE-FOUR,CERVICAL FOUR-FIVE,CERVICAL  SIX-SEVEN;  Surgeon: Newman Pies, MD;  Location: [facility];  Service: Neurosurgery;  Laterality: N/A;  anterior   CERVICAL SPINE SURGERY N/A 1990   C5-6 discectomy plates and screws in neck per pt   colonscopy  2017   polyps removed per pt follow up in 5 years   FINGER SURGERY Right 2008   x 3 index finger   KNEE ARTHROSCOPY Bilateral    L 2004, R 2005   MASS EXCISION Right 01/18/2017   Procedure: EXCISION BENIGN RIGHT NECK MASS;  Surgeon: Rozetta Nunnery, MD;  Location: [facility];  Service: ENT;  Laterality: Right;   MASS EXCISION Bilateral 03/23/2019   Procedure: EXCISIONS OF RIGHT CHEEK LESION, RIGHT POSTERIOR NECK LIPOMA  AND LEFT NECK NODULE;  Surgeon: Rozetta Nunnery, MD;  Location: [facility];  Service: ENT;  Laterality: Bilateral;   [place] Right 06/01/2016   Procedure: TOTAL KNEE ARTHROPLASTY;  Surgeon: [name], MD;  Location: [facility];  Service: Orthopedics;  Laterality: Right;   TOTAL KNEE ARTHROPLASTY Left 07/25/2018   Procedure: LEFT TOTAL KNEE ARTHROPLASTY;  Surgeon: [name], MD;  Location: [facility];  Service: Orthopedics;  Laterality: Left;   ULNAR NERVE TRANSPOSITION Left 11/25/2020   Procedure: ulnar nerve decompression left elbow;  Surgeon: [name], MD;  Location: [facility];  Service: Orthopedics;  Laterality: Left;   Social History   Occupational History   Not on file  Tobacco Use   Smoking status: Never   Smokeless tobacco: Never  Vaping Use   Vaping Use: Never used  Substance and Sexual Activity   Alcohol use: Yes    Comment: rarely wine   Drug use: Never   Sexual activity: Not on file

## 2020-12-17 ENCOUNTER — Other Ambulatory Visit: Payer: Self-pay

## 2020-12-17 ENCOUNTER — Ambulatory Visit (INDEPENDENT_AMBULATORY_CARE_PROVIDER_SITE_OTHER): Payer: 59 | Admitting: Orthopaedic Surgery

## 2020-12-17 ENCOUNTER — Encounter: Payer: Self-pay | Admitting: Orthopaedic Surgery

## 2020-12-17 VITALS — Ht 67.5 in | Wt 219.0 lb

## 2020-12-17 DIAGNOSIS — G5622 Lesion of ulnar nerve, left upper limb: Secondary | ICD-10-CM

## 2020-12-17 NOTE — Progress Notes (Signed)
Office Visit Note   Patient: James Gallegos           Date of Birth: 1951/03/25           MRN: [id number] Visit Date: 12/17/2020              Requested by: Eunice Blase, MD [address] [place],  [place] [address] PCP: Eunice Blase, MD   Assessment & Plan: Visit Diagnoses:  1. Ulnar neuropathy of left upper extremity     Plan: Approaching a month status post decompression of the ulnar nerve left elbow and doing well.  He notes that most of his feeling has returned and is now really not having any appreciable pain.  He is trying to be careful about placing the medial aspect of his elbow on hard surfaces and I suggested he may want to pick up an elbow pad.  He is done very well I will plan to see him back as needed.  I did complete a temporary handicap parking sticker based on multiple orthopedic problems  Follow-Up Instructions: Return if symptoms worsen or fail to improve.   Orders:  No orders of the defined types were placed in this encounter.  No orders of the defined types were placed in this encounter.     Procedures: No procedures performed   Clinical Data: No additional findings.   Subjective: Chief Complaint  Patient presents with   Left Elbow - Left ulnar nerve decompression 11/25/2020  Patient presents today for his left elbow. He is now 3 weeks out from left ulnar nerve decompression. His surgery was on 11/25/2020. Patient states that he is doing okay. He said that he feels the best in the morning, but it tends to get sore as the day progresses. He said that last week his elbow flared up and he had to take oxycodone.  HPI  Review of Systems   Objective: Vital Signs: Ht 5' 7.5" (1.715 m)   Wt 219 lb (99.3 kg)   BMI 33.79 kg/m   Physical Exam  Ortho Exam ulnar nerve decompression incision along the medial aspect left elbow has healed without problem.  Very minimal tenderness over the nerve and no Tinel's.  Good strength distally with normal AB  duction and ad duction of the digits.  He notes he has good sensation and much better than he did before he had surgery.  No swelling.  Specialty Comments:  No specialty comments available.  Imaging: No results found.   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   Cervical spondylosis with myelopathy and radiculopathy 04/14/2020   Impingement syndrome of right shoulder 10/10/2019   Low back pain 10/10/2019   Irritation of ulnar nerve, left 08/28/2019   Class 2 obesity due to excess calories without serious comorbidity with body mass index (BMI) of 39.0 to 39.9 in adult 01/08/2019   Pain in right elbow 12/06/2018   Primary osteoarthritis of left knee 07/25/2018   History of left knee replacement 07/25/2018   Right foot pain 06/22/2018   Acute left-sided low back pain with left-sided sciatica 06/22/2018   Osteoarthritis 11/21/2017   Hyperlipidemia 11/21/2017   Low testosterone 09/26/2016   Healthcare maintenance 09/26/2016   S/P total knee replacement using cement, right 06/01/2016   Sleep apnea 05/19/2016   Hypertension 05/19/2016   Past Medical History:  Diagnosis Date   Arthritis    back , R foot -   Deep vein  thrombosis ([place])    after knee surgery in 2004   GERD (gastroesophageal reflux disease)    diet controlled no meds per pt   Hypertension    Murmur, cardiac    pt. 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    Sleep apnea    CPAP- last study- 2014 cpap set on 13 per pt   Wears glasses 11/20/2020   for reading    Family History  Problem Relation Age of Onset   Dementia Mother    Alzheimer's disease Father    Healthy Brother    Colon cancer Neg Hx    Esophageal cancer Neg Hx    Rectal cancer Neg Hx    Stomach  cancer Neg Hx    Cancer Neg Hx    Diabetes Neg Hx     Past Surgical History:  Procedure Laterality Date   ANTERIOR CERVICAL DECOMP/DISCECTOMY FUSION N/A 04/14/2020   Procedure: ANTERIOR CERVICAL DECOMPRESSION/DISCECTOMY FUSION, INTERBODY PROSTHESIS, PLATE/SCREWS CERVICAL THREE-FOUR,CERVICAL FOUR-FIVE,CERVICAL SIX-SEVEN;  Surgeon: Newman Pies, MD;  Location: [facility];  Service: Neurosurgery;  Laterality: N/A;  anterior   CERVICAL SPINE SURGERY N/A 1990   C5-6 discectomy plates and screws in neck per pt   colonscopy  2017   polyps removed per pt follow up in 5 years   FINGER SURGERY Right 2008   x 3 index finger   KNEE ARTHROSCOPY Bilateral    L 2004, R 2005   MASS EXCISION Right 01/18/2017   Procedure: EXCISION BENIGN RIGHT NECK MASS;  Surgeon: Rozetta Nunnery, MD;  Location: [facility];  Service: ENT;  Laterality: Right;   MASS EXCISION Bilateral 03/23/2019   Procedure: EXCISIONS OF RIGHT CHEEK LESION, RIGHT POSTERIOR NECK LIPOMA  AND LEFT NECK NODULE;  Surgeon: Rozetta Nunnery, MD;  Location: [facility];  Service: ENT;  Laterality: Bilateral;   [place] Right 06/01/2016   Procedure: TOTAL KNEE ARTHROPLASTY;  Surgeon: [name], MD;  Location: [facility];  Service: Orthopedics;  Laterality: Right;   TOTAL KNEE ARTHROPLASTY Left 07/25/2018   Procedure: LEFT TOTAL KNEE ARTHROPLASTY;  Surgeon: [name], MD;  Location: [facility];  Service: Orthopedics;  Laterality: Left;   ULNAR NERVE TRANSPOSITION Left 11/25/2020   Procedure: ulnar nerve decompression left elbow;  Surgeon: [name], MD;  Location: [facility];  Service: Orthopedics;  Laterality: Left;   Social History   Occupational History   Not on file  Tobacco Use   Smoking status: Never   Smokeless tobacco: Never  Vaping Use   Vaping Use: Never used  Substance and Sexual Activity   Alcohol use: Yes    Comment: rarely wine    Drug use: Never   Sexual activity: Not on file

## 2021-02-19 ENCOUNTER — Other Ambulatory Visit: Payer: Self-pay

## 2021-02-19 ENCOUNTER — Ambulatory Visit (INDEPENDENT_AMBULATORY_CARE_PROVIDER_SITE_OTHER): Payer: 59 | Admitting: Otolaryngology

## 2021-02-19 DIAGNOSIS — J310 Chronic rhinitis: Secondary | ICD-10-CM

## 2021-02-19 DIAGNOSIS — G4733 Obstructive sleep apnea (adult) (pediatric): Secondary | ICD-10-CM

## 2021-02-19 NOTE — Progress Notes (Signed)
HPI: James Gallegos is a 70 y.o. male who presents for evaluation of obstructive sleep apnea.  Patient has a nasal CPAP machine that he has been using for close to 10years.  The machine is not working as well and he needs a new machine and presents today for evaluation prior to getting a new CPAP machine.  He has been using this regularly.  He also uses either Flonase or Nasacort intermittently depending on nasal congestion.  Past Medical History:  Diagnosis Date   Arthritis    back , R foot -   Deep vein thrombosis ([place])    after knee surgery in 2004   GERD (gastroesophageal reflux disease)    diet controlled no meds per pt   Hypertension    Murmur, cardiac    pt. 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    Sleep apnea    CPAP- last study- 2014 cpap set on 13 per pt   Wears glasses 11/20/2020   for reading   Past Surgical History:  Procedure Laterality Date   ANTERIOR CERVICAL DECOMP/DISCECTOMY FUSION N/A 04/14/2020   Procedure: ANTERIOR CERVICAL DECOMPRESSION/DISCECTOMY FUSION, INTERBODY PROSTHESIS, PLATE/SCREWS CERVICAL THREE-FOUR,CERVICAL FOUR-FIVE,CERVICAL SIX-SEVEN;  Surgeon: Kyiah Canepa Pies, MD;  Location: [facility];  Service: Neurosurgery;  Laterality: N/A;  anterior   CERVICAL SPINE SURGERY N/A 1990   C5-6 discectomy plates and screws in neck per pt   colonscopy  2017   polyps removed per pt follow up in 5 years   FINGER SURGERY Right 2008   x 3 index finger   KNEE ARTHROSCOPY Bilateral    L 2004, R 2005   MASS EXCISION Right 01/18/2017   Procedure: EXCISION BENIGN RIGHT NECK MASS;  Surgeon: Rozetta Nunnery, MD;  Location: [facility];  Service: ENT;  Laterality: Right;   MASS EXCISION Bilateral 03/23/2019   Procedure: EXCISIONS OF RIGHT CHEEK LESION, RIGHT  POSTERIOR NECK LIPOMA  AND LEFT NECK NODULE;  Surgeon: Rozetta Nunnery, MD;  Location: [facility];  Service: ENT;  Laterality: Bilateral;   [place] Right 06/01/2016   Procedure: TOTAL KNEE ARTHROPLASTY;  Surgeon: [name], MD;  Location: [facility];  Service: Orthopedics;  Laterality: Right;   TOTAL KNEE ARTHROPLASTY Left 07/25/2018   Procedure: LEFT TOTAL KNEE ARTHROPLASTY;  Surgeon: [name], MD;  Location: [facility];  Service: Orthopedics;  Laterality: Left;   ULNAR NERVE TRANSPOSITION Left 11/25/2020   Procedure: ulnar nerve decompression left elbow;  Surgeon: [name], MD;  Location: [facility];  Service: Orthopedics;  Laterality: Left;   Social History   Socioeconomic History   Marital status: Married    Spouse name: Not on file   Number of children: Not on file   Years of education: Not on file   Highest education level: Not on file  Occupational History   Not on file  Tobacco Use   Smoking status: Never   Smokeless tobacco: Never  Vaping Use   Vaping Use: Never used  Substance and Sexual Activity   Alcohol use: Yes    Comment: rarely wine   Drug use: Never   Sexual activity: Not on file  Other Topics Concern   Not on file  Social History Narrative   Not on file   Social Determinants of Health   Financial Resource Strain: Not on file  Food Insecurity: Not on file  Transportation Needs: Not on file  Physical Activity: Not on file  Stress: Not on file  Social Connections: Not on file   Family History  Problem Relation Age of Onset   Dementia Mother    Alzheimer's disease Father    Healthy Brother    Colon cancer Neg Hx    Esophageal cancer Neg Hx    Rectal cancer Neg Hx    Stomach cancer Neg Hx    Cancer Neg Hx    Diabetes Neg Hx    No Known Allergies Prior to Admission medications   Medication Sig Start Date End Date Taking? Authorizing Provider  Acetylcysteine  (N-ACETYL-L-CYSTEINE PO) Take 100 mg by mouth daily.    [provider]  amoxicillin (AMOXIL) 500 MG capsule Take 2,000 mg by mouth See admin instructions. For dental procedure 10/15/19   [provider]  Ascorbic Acid (VITAMIN C) 1000 MG tablet Take 5,000 mg by mouth daily.    [provider]  b complex vitamins capsule Take 1 capsule by mouth daily. B-100    [provider]  Cholecalciferol (VITAMIN D-3) 5000 units TABS Take 5,000 Units by mouth daily.    [provider]  Cyanocobalamin (VITAMIN B-12) 5000 MCG TBDP Take 5,000 mcg by mouth daily.    [provider]  DHEA 50 MG CAPS Take 50 mg by mouth daily.     [provider]  Diindolylmethane POWD Take 250 mg by mouth daily. Capsule    [provider]  fluticasone (FLONASE) 50 MCG/ACT nasal spray Place 1 spray into both nostrils at bedtime as needed for allergies or rhinitis.     [provider]  Javier Docker Oil 500 MG CAPS Take 500 mg by mouth daily.    [provider]  losartan-hydrochlorothiazide (HYZAAR) 100-25 MG tablet Take 1 tablet by mouth daily. 11/19/20   Hilts, Legrand Como, MD  Lysine HCl 500 MG TABS Take 1,000 mg by mouth daily.     [provider]  Magnesium 400 MG TABS Take 400 mg by mouth daily at 8 pm.     [provider]  Menaquinone-7 (VITAMIN K2 PO) Take 200 mcg by mouth daily.    [provider]  oxyCODONE (ROXICODONE) 5 MG immediate release tablet Take 1 tablet (5 mg total) by mouth every 4 (four) hours as needed for severe pain. 11/25/20   Cherylann Ratel, PA-C  PRESCRIPTION MEDICATION Apply 1 application topically at bedtime. Testerone 13.5 % applied to shoulder [facility]    [provider]  QUERCETIN PO Take 1,000 mg by mouth daily.    [provider]  Saw Palmetto, Serenoa repens, 320 MG CAPS Take 320 mg by mouth daily.    [provider]  zinc gluconate 50 MG tablet  Take 50 mg by mouth daily.    [provider]     Positive ROS: Otherwise negative  All other systems have been reviewed and were otherwise negative with the exception of those mentioned in the HPI and as above.  Physical Exam: Constitutional: Alert, well-appearing, no acute distress Ears: External ears without lesions or tenderness. Ear canals are clear bilaterally with intact, clear TMs.  Nasal: External nose without lesions. Septum with minimal deformity.  Mild rhinitis.  No intranasal polyps noted.. Clear nasal passages otherwise. Oral: Lips and  gums without lesions. Tongue and palate mucosa without lesions. Posterior oropharynx clear.  Small average sized tonsils with elongated uvula. Neck: No palpable adenopathy or masses Respiratory: Breathing comfortably  Skin: No facial/neck lesions or rash noted.  Procedures  Assessment: Obstructive sleep apnea Chronic rhinitis  Plan: Would recommend regular use of nasal steroid spray as this will help some with nasal congestion. He will need to get a new nasal CPAP machine for treatment of his obstructive sleep apnea.  Radene Journey, MD

## 2021-03-03 DIAGNOSIS — Z6832 Body mass index (BMI) 32.0-32.9, adult: Secondary | ICD-10-CM | POA: Diagnosis not present

## 2021-03-03 DIAGNOSIS — I10 Essential (primary) hypertension: Secondary | ICD-10-CM | POA: Diagnosis not present

## 2021-03-03 DIAGNOSIS — M4712 Other spondylosis with myelopathy, cervical region: Secondary | ICD-10-CM | POA: Diagnosis not present

## 2021-03-10 ENCOUNTER — Other Ambulatory Visit (HOSPITAL_COMMUNITY): Payer: Self-pay

## 2021-03-16 ENCOUNTER — Other Ambulatory Visit (HOSPITAL_COMMUNITY): Payer: Self-pay

## 2021-03-25 ENCOUNTER — Ambulatory Visit (INDEPENDENT_AMBULATORY_CARE_PROVIDER_SITE_OTHER): Payer: 59 | Admitting: Physician Assistant

## 2021-03-25 ENCOUNTER — Other Ambulatory Visit: Payer: Self-pay

## 2021-03-25 ENCOUNTER — Ambulatory Visit: Payer: Self-pay

## 2021-03-25 ENCOUNTER — Encounter: Payer: Self-pay | Admitting: Orthopaedic Surgery

## 2021-03-25 VITALS — Ht 67.5 in | Wt 219.0 lb

## 2021-03-25 DIAGNOSIS — M25511 Pain in right shoulder: Secondary | ICD-10-CM

## 2021-03-25 NOTE — Progress Notes (Signed)
Office Visit Note   Patient: James Gallegos           Date of Birth: 08-Feb-1951           MRN: [id number] Visit Date: 03/25/2021              Requested by: Eunice Blase, MD [address], [place],  [place] [address] PCP: Eunice Blase, MD   Assessment & Plan: Visit Diagnoses: Right shoulder pain concerning for rotator cuff tear  Plan: Findings concerning for rotator cuff tear of the right shoulder.  Will refer patient for MRI.  May very well need an arthroscopy and rotator cuff repair.  We will follow-up after MRI.  If appropriate he would like to have surgery soon as possible  Follow-Up Instructions: No follow-ups on file.   Orders:  Orders Placed This Encounter  Procedures   XR Shoulder Right   MR SHOULDER RIGHT WO CONTRAST   No orders of the defined types were placed in this encounter.     Procedures: No procedures performed   Clinical Data: No additional findings.   Subjective: Chief Complaint  Patient presents with   Right Shoulder - Pain  Patient presents today for right shoulder pain. 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 He had immediate pain. He continues to hurt superiorly and with any given movement. He has limited range of motion. He has some numbness and tingling in his right hand.  But this only occurs at night.  When he wakes up in the morning his hand is tingly but this resolves during the day he is right hand dominant. He has been taking some oxycodone he had left over from a previous surgery.  His motion is severely limited because of his pain  HPI   Review of Systems   Objective: Vital Signs: Ht 5' 7.5" (1.715 m)   Wt 219 lb (99.3 kg)   BMI 33.79 kg/m   Physical Exam Constitutional:      Appearance: Normal appearance.  Pulmonary:     Effort: Pulmonary effort is normal.    Ortho Exam Examination of his right shoulder demonstrates some guarding with wanting to  keep his shoulder by his side.  He has no tenderness to palpation over the cervical spine.  His range of motion is limited by pain.  He has 150 degrees of forward elevation.  He can reach behind his back to his buttock but it is quite painful.  He is able to lift off and sustained this with significant pain.  Cross body motion is also moderately painful.  He has a positive speeds test but no bruising around the bicep and the bicep is intact without Popeye deformity.  He has positive impingement findings including Neer's and Hawkins test.  She has pain with empty can testing.  He has good strength in his hands and has no sensory deficit distally at this time. Specialty Comments:  No specialty comments available.  Imaging: No results found.   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   Cervical spondylosis with myelopathy and radiculopathy 04/14/2020   Impingement syndrome of right shoulder 10/10/2019   Low back pain 10/10/2019   Irritation of ulnar nerve, left 08/28/2019   Class 2 obesity due to excess calories without serious comorbidity with body mass index (BMI) of 39.0 to 39.9 in adult 01/08/2019  Pain in right elbow 12/06/2018   Primary osteoarthritis of left knee 07/25/2018   History of left knee replacement 07/25/2018   Right foot pain 06/22/2018   Acute left-sided low back pain with left-sided sciatica 06/22/2018   Osteoarthritis 11/21/2017   Hyperlipidemia 11/21/2017   Low testosterone 09/26/2016   Healthcare maintenance 09/26/2016   S/P total knee replacement using cement, right 06/01/2016   Sleep apnea 05/19/2016   Hypertension 05/19/2016   Past Medical History:  Diagnosis Date   Arthritis    back , R foot -   Deep vein thrombosis ([place])    after knee surgery in 2004   GERD (gastroesophageal reflux disease)    diet controlled no meds per pt   Hypertension    Murmur, cardiac    pt. 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    Sleep apnea    CPAP- last study- 2014 cpap set on 13 per pt   Wears glasses 11/20/2020   for reading    Family History  Problem Relation Age of Onset   Dementia Mother    Alzheimer's disease Father    Healthy Brother    Colon cancer Neg Hx    Esophageal cancer Neg Hx    Rectal cancer Neg Hx    Stomach cancer Neg Hx    Cancer Neg Hx    Diabetes Neg Hx     Past Surgical History:  Procedure Laterality Date   ANTERIOR CERVICAL DECOMP/DISCECTOMY FUSION N/A 04/14/2020   Procedure: ANTERIOR CERVICAL DECOMPRESSION/DISCECTOMY FUSION, INTERBODY PROSTHESIS, PLATE/SCREWS CERVICAL THREE-FOUR,CERVICAL FOUR-FIVE,CERVICAL SIX-SEVEN;  Surgeon: Newman Pies, MD;  Location: [facility];  Service: Neurosurgery;  Laterality: N/A;  anterior   CERVICAL SPINE SURGERY N/A 1990   C5-6 discectomy plates and screws in neck per pt   colonscopy  2017   polyps removed per pt follow up in 5 years   FINGER SURGERY Right 2008   x 3 index finger   KNEE ARTHROSCOPY Bilateral    L 2004, R 2005   MASS EXCISION Right 01/18/2017   Procedure: EXCISION BENIGN RIGHT NECK MASS;  Surgeon: Rozetta Nunnery, MD;  Location: [facility];  Service: ENT;  Laterality: Right;   MASS EXCISION Bilateral 03/23/2019   Procedure: EXCISIONS OF RIGHT CHEEK LESION, RIGHT POSTERIOR NECK LIPOMA  AND LEFT NECK NODULE;  Surgeon: Rozetta Nunnery, MD;  Location: [facility];  Service: ENT;  Laterality: Bilateral;   [place] Right 06/01/2016   Procedure: TOTAL KNEE ARTHROPLASTY;  Surgeon: [name], MD;  Location: [facility];  Service: Orthopedics;  Laterality: Right;   TOTAL KNEE ARTHROPLASTY Left 07/25/2018   Procedure: LEFT TOTAL KNEE  ARTHROPLASTY;  Surgeon: [name], MD;  Location: [facility];  Service: Orthopedics;  Laterality: Left;   ULNAR NERVE TRANSPOSITION Left 11/25/2020   Procedure: ulnar nerve decompression left elbow;  Surgeon: [name], MD;  Location: [facility];  Service: Orthopedics;  Laterality: Left;   Social History   Occupational History   Not on file  Tobacco Use   Smoking status: Never   Smokeless tobacco: Never  Vaping Use   Vaping Use: Never used  Substance and Sexual Activity   Alcohol use: Yes  Comment: rarely wine   Drug use: Never   Sexual activity: Not on file

## 2021-03-27 ENCOUNTER — Ambulatory Visit
Admission: RE | Admit: 2021-03-27 | Discharge: 2021-03-27 | Disposition: A | Discharge status: Home / Self Care | Payer: 59 | Source: Ambulatory Visit | Attending: Orthopaedic Surgery | Admitting: Orthopaedic Surgery

## 2021-03-27 DIAGNOSIS — M25511 Pain in right shoulder: Secondary | ICD-10-CM

## 2021-03-27 DIAGNOSIS — M19011 Primary osteoarthritis, right shoulder: Secondary | ICD-10-CM | POA: Diagnosis not present

## 2021-03-31 ENCOUNTER — Encounter: Payer: Self-pay | Admitting: Orthopaedic Surgery

## 2021-03-31 ENCOUNTER — Ambulatory Visit (INDEPENDENT_AMBULATORY_CARE_PROVIDER_SITE_OTHER): Payer: 59 | Admitting: Orthopaedic Surgery

## 2021-03-31 ENCOUNTER — Other Ambulatory Visit: Payer: Self-pay

## 2021-03-31 DIAGNOSIS — M7541 Impingement syndrome of right shoulder: Secondary | ICD-10-CM

## 2021-03-31 NOTE — Progress Notes (Signed)
Office Visit Note   Patient: James Gallegos           Date of Birth: September 18, 1950           MRN: [id number] Visit Date: 03/31/2021              Requested by: Eunice Blase, MD [address], [place],  [place] [address] PCP: Eunice Blase, MD   Assessment & Plan: Visit Diagnoses:  1. Impingement syndrome of right shoulder     Plan: Mr. Kawano had an MRI scan of his right shoulder that was compared to an MRI of the same shoulder performed in October 2021.  There was diffuse rotator cuff tendinosis with a new partial articular surface insertional tearing of the supraspinatus tendon.  There was a mild sentinel cyst formation in the infraspinatus tendon but no full-thickness tendon tear or tendon retraction.  No focal muscle atrophy or edema.  Progressive tendinosis of the intra-articular portion of the biceps long head which was normally located.  Type II acromion.  Moderate AC joint degenerative changes with a small amount of fluid in the subacromial subdeltoid bursa.  Glenohumeral joint was clear of any arthritic change.  Labrum there did not appear to be torn.  Long discussion regarding the findings.  He has had a difficult time for a while to the point of compromise.  Read then pursue further injections or therapy he like to proceed with surgery.  This would include arthroscopic SCD DCR and then possibly a mini open rotator cuff tear repair depending upon the findings and possible biceps tenodesis.  I discussed all the above with him including the outpatient nature surgery ,and use of sling and even physical therapy.  He would like to proceed.  I discussed the arthroscopic portals and a mini open incision and what he may or may not expect postoperatively.  He would need some physical therapy postop  Follow-Up Instructions: Return We will schedule right shoulder surgery.   Orders:  No orders of the defined types were placed in this encounter.  No orders of the defined types  were placed in this encounter.     Procedures: No procedures performed   Clinical Data: No additional findings.   Subjective: Chief Complaint  Patient presents with   Right Shoulder - Follow-up    MRI review  Patient presents today for follow up on his right shoulder. He had an MRI and is here today for those results.  Not much change in terms of his shoulder.  He is having difficulty with overhead motion and pain sleeping on that side  HPI  Review of Systems   Objective: Vital Signs: There were no vitals taken for this visit.  Physical Exam Constitutional:      Appearance: He is well-developed.  Eyes:     Pupils: Pupils are equal, round, and reactive to light.  Pulmonary:     Effort: Pulmonary effort is normal.  Skin:    General: Skin is warm and dry.  Neurological:     Mental Status: He is alert and oriented to person, place, and time.  Psychiatric:        Behavior: Behavior normal.    Ortho Exam right shoulder with difficulty raising his arm over his head.  Motion was slow and circuitous.  He had full overhead motion.  Lots of pain with abduction in the anterior lateral subacromial region.  No crepitation.  Some hypertrophic change at the [facility] joint but no palpable pain.  Some discomfort along the anterior subacromial region.  Positive empty can testing.  Positive Speed sign.  Biceps intact.  Good grip and release.  Appears to have good strength  Specialty Comments:  No specialty comments available.  Imaging: No results found.   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   Cervical spondylosis with myelopathy and radiculopathy 04/14/2020   Impingement syndrome of right shoulder 10/10/2019   Low back pain 10/10/2019   Irritation of ulnar nerve, left 08/28/2019   Class 2 obesity due to excess calories without serious comorbidity with body mass index (BMI) of 39.0 to 39.9 in adult 01/08/2019   Pain in  right elbow 12/06/2018   Primary osteoarthritis of left knee 07/25/2018   History of left knee replacement 07/25/2018   Right foot pain 06/22/2018   Acute left-sided low back pain with left-sided sciatica 06/22/2018   Osteoarthritis 11/21/2017   Hyperlipidemia 11/21/2017   Low testosterone 09/26/2016   Healthcare maintenance 09/26/2016   S/P total knee replacement using cement, right 06/01/2016   Sleep apnea 05/19/2016   Hypertension 05/19/2016   Past Medical History:  Diagnosis Date   Arthritis    back , R foot -   Deep vein thrombosis ([place])    after knee surgery in 2004   GERD (gastroesophageal reflux disease)    diet controlled no meds per pt   Hypertension    Murmur, cardiac    pt. 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    Sleep apnea    CPAP- last study- 2014 cpap set on 13 per pt   Wears glasses 11/20/2020   for reading    Family History  Problem Relation Age of Onset   Dementia Mother    Alzheimer's disease Father    Healthy Brother    Colon cancer Neg Hx    Esophageal cancer Neg Hx    Rectal cancer Neg Hx    Stomach cancer Neg Hx    Cancer Neg Hx    Diabetes Neg Hx     Past Surgical History:  Procedure Laterality Date   ANTERIOR CERVICAL DECOMP/DISCECTOMY FUSION N/A 04/14/2020   Procedure: ANTERIOR CERVICAL DECOMPRESSION/DISCECTOMY FUSION, INTERBODY PROSTHESIS, PLATE/SCREWS CERVICAL THREE-FOUR,CERVICAL FOUR-FIVE,CERVICAL SIX-SEVEN;  Surgeon: Newman Pies, MD;  Location: [facility];  Service: Neurosurgery;  Laterality: N/A;  anterior   CERVICAL SPINE SURGERY N/A 1990   C5-6 discectomy plates and screws in neck per pt   colonscopy  2017   polyps removed per pt follow up in 5 years   FINGER SURGERY Right 2008   x 3 index finger   KNEE ARTHROSCOPY Bilateral    L 2004,  R 2005   MASS EXCISION Right 01/18/2017   Procedure: EXCISION BENIGN RIGHT NECK MASS;  Surgeon: Rozetta Nunnery, MD;  Location: [facility];  Service: ENT;  Laterality: Right;   MASS EXCISION Bilateral 03/23/2019   Procedure: EXCISIONS OF RIGHT CHEEK LESION, RIGHT POSTERIOR NECK LIPOMA  AND LEFT NECK NODULE;  Surgeon: Rozetta Nunnery, MD;  Location: [facility];  Service: ENT;  Laterality: Bilateral;   [place] Right 06/01/2016   Procedure: TOTAL KNEE ARTHROPLASTY;  Surgeon: Vonna Kotyk  Durward Fortes, MD;  Location: [facility];  Service: Orthopedics;  Laterality: Right;   TOTAL KNEE ARTHROPLASTY Left 07/25/2018   Procedure: LEFT TOTAL KNEE ARTHROPLASTY;  Surgeon: [name], MD;  Location: [facility];  Service: Orthopedics;  Laterality: Left;   ULNAR NERVE TRANSPOSITION Left 11/25/2020   Procedure: ulnar nerve decompression left elbow;  Surgeon: [name], MD;  Location: [facility];  Service: Orthopedics;  Laterality: Left;   Social History   Occupational History   Not on file  Tobacco Use   Smoking status: Never   Smokeless tobacco: Never  Vaping Use   Vaping Use: Never used  Substance and Sexual Activity   Alcohol use: Yes    Comment: rarely wine   Drug use: Never   Sexual activity: Not on file

## 2021-04-06 NOTE — Progress Notes (Signed)
Sent message, via epic in basket, requesting orders in epic from surgeon.  

## 2021-04-07 ENCOUNTER — Other Ambulatory Visit (HOSPITAL_COMMUNITY): Payer: Self-pay

## 2021-04-07 ENCOUNTER — Telehealth: Payer: Self-pay | Admitting: Orthopaedic Surgery

## 2021-04-07 MED ORDER — EPINEPHRINE 0.3 MG/0.3ML IJ SOAJ
INTRAMUSCULAR | 6 refills | Status: AC
  Filled 2021-04-07: qty 2, 30d supply, fill #0
  Filled 2022-01-05: qty 2, 30d supply, fill #1

## 2021-04-07 NOTE — Patient Instructions (Addendum)
DUE TO COVID-19 ONLY ONE VISITOR IS ALLOWED TO COME WITH YOU AND STAY IN THE WAITING ROOM ONLY DURING PRE OP AND PROCEDURE.   **NO VISITORS ARE ALLOWED IN THE SHORT STAY AREA OR RECOVERY ROOM!!**  IF YOU WILL BE ADMITTED INTO THE HOSPITAL YOU ARE ALLOWED ONLY TWO SUPPORT PEOPLE DURING VISITATION HOURS ONLY (7 AM -8PM)   The support person(s) must pass our screening, gel in and out, and wear a mask at all times, including in the patient's room. Patients must also wear a mask when staff or their support person are in the room. Visitors GUEST BADGE MUST BE WORN VISIBLY  One adult visitor may remain with you overnight and MUST be in the room by 8 P.M.  No visitors under the age of 8. Any visitor under the age of 23 must be accompanied by an adult.     Your procedure is scheduled on: 04/21/21   Report to [facility] Main Entrance    Report to short stay at : 5:15 AM   Call this number if you have problems the morning of surgery [phone]   Do not eat food :After Midnight.   May have liquids until: 4:15 AM    day of surgery  CLEAR LIQUID DIET  Foods Allowed                                                                     Foods Excluded  Water, Black Coffee and tea, regular and decaf                             liquids that you cannot  Plain Jell-O in any flavor  (No red)                                           see through such as: Fruit ices (not with fruit pulp)                                     milk, soups, orange juice              Iced Popsicles (No red)                                    All solid food                                   Apple juices Sports drinks like Gatorade (No red) Lightly seasoned clear broth or consume(fat free) Sugar  Sample Menu Breakfast                                Lunch  Supper Cranberry juice                    Beef broth                            Chicken broth Jell-O                                      Grape juice                           Apple juice Coffee or tea                        Jell-O                                      Popsicle                                                Coffee or tea                        Coffee or tea     Oral Hygiene is also important to reduce your risk of infection.                                    Remember - BRUSH YOUR TEETH THE MORNING OF SURGERY WITH YOUR REGULAR TOOTHPASTE   Do NOT smoke after Midnight   Take these medicines the morning of surgery with A SIP OF WATER:   DO NOT TAKE ANY ORAL DIABETIC MEDICATIONS DAY OF YOUR SURGERY                              You may not have any metal on your body including hair pins, jewelry, and body piercing             Do not wear make-up, lotions, powders, perfumes/cologne, or deodorant  Do not wear nail polish including gel and S&S, artificial/acrylic nails, or any other type of covering on natural nails including finger and toenails. If you have artificial nails, gel coating, etc. that needs to be removed by a nail salon please have this removed prior to surgery or surgery may need to be canceled/ delayed if the surgeon/ anesthesia feels like they are unable to be safely monitored.   Do not shave  48 hours prior to surgery.               Men may shave face and neck.   Do not bring valuables to the hospital. [place].   Contacts, dentures or bridgework may not be worn into surgery.   Bring small overnight bag day of surgery.    Patients discharged on the day of surgery will not be allowed to drive home.   Special Instructions: Bring a  copy of your healthcare power of attorney and living will documents         the day of surgery if you haven't scanned them before.              Please read over the following fact sheets you were given: IF YOU HAVE QUESTIONS ABOUT YOUR PRE-OP INSTRUCTIONS PLEASE CALL [phone]     [facility] - Preparing  for Surgery Before surgery, you can play an important role.  Because skin is not sterile, your skin needs to be as free of germs as possible.  You can reduce the number of germs on your skin by washing with CHG (chlorahexidine gluconate) soap before surgery.  CHG is an antiseptic cleaner which kills germs and bonds with the skin to continue killing germs even after washing. Please DO NOT use if you have an allergy to CHG or antibacterial soaps.  If your skin becomes reddened/irritated stop using the CHG and inform your nurse when you arrive at Short Stay. Do not shave (including legs and underarms) for at least 48 hours prior to the first CHG shower.  You may shave your face/neck. Please follow these instructions carefully:  1.  Shower with CHG Soap the night before surgery and the  morning of Surgery.  2.  If you choose to wash your hair, wash your hair first as usual with your  normal  shampoo.  3.  After you shampoo, rinse your hair and body thoroughly to remove the  shampoo.                           4.  Use CHG as you would any other liquid soap.  You can apply chg directly  to the skin and wash                       Gently with a scrungie or clean washcloth.  5.  Apply the CHG Soap to your body ONLY FROM THE NECK DOWN.   Do not use on face/ open                           Wound or open sores. Avoid contact with eyes, ears mouth and genitals (private parts).                       Wash face,  Genitals (private parts) with your normal soap.             6.  Wash thoroughly, paying special attention to the area where your surgery  will be performed.  7.  Thoroughly rinse your body with warm water from the neck down.  8.  DO NOT shower/wash with your normal soap after using and rinsing off  the CHG Soap.                9.  Pat yourself dry with a clean towel.            10.  Wear clean pajamas.            11.  Place clean sheets on your bed the night of your first shower and do not  sleep with  pets. Day of Surgery : Do not apply any lotions/deodorants the morning of surgery.  Please wear clean clothes to the hospital/surgery center.  FAILURE TO FOLLOW THESE INSTRUCTIONS MAY RESULT IN THE CANCELLATION  OF YOUR SURGERY PATIENT SIGNATURE_________________________________  NURSE SIGNATURE__________________________________  ________________________________________________________________________

## 2021-04-07 NOTE — Telephone Encounter (Signed)
Records emailed to [facility]

## 2021-04-08 ENCOUNTER — Encounter (HOSPITAL_COMMUNITY): Payer: Self-pay | Admitting: Physician Assistant

## 2021-04-08 ENCOUNTER — Other Ambulatory Visit: Payer: Self-pay

## 2021-04-08 ENCOUNTER — Encounter: Payer: Self-pay | Admitting: Orthopaedic Surgery

## 2021-04-08 ENCOUNTER — Encounter (HOSPITAL_COMMUNITY)
Admission: RE | Admit: 2021-04-08 | Discharge: 2021-04-08 | Disposition: A | Discharge status: Home / Self Care | Payer: 59 | Source: Ambulatory Visit | Attending: Orthopaedic Surgery | Admitting: Orthopaedic Surgery

## 2021-04-08 ENCOUNTER — Encounter (HOSPITAL_COMMUNITY): Payer: Self-pay

## 2021-04-08 VITALS — BP 155/69 | HR 62 | Temp 98.4°F | Ht 67.5 in | Wt 208.0 lb

## 2021-04-08 DIAGNOSIS — I10 Essential (primary) hypertension: Secondary | ICD-10-CM | POA: Insufficient documentation

## 2021-04-08 DIAGNOSIS — R7303 Prediabetes: Secondary | ICD-10-CM | POA: Diagnosis not present

## 2021-04-08 DIAGNOSIS — Z01818 Encounter for other preprocedural examination: Secondary | ICD-10-CM | POA: Insufficient documentation

## 2021-04-08 DIAGNOSIS — R269 Unspecified abnormalities of gait and mobility: Secondary | ICD-10-CM | POA: Diagnosis not present

## 2021-04-08 DIAGNOSIS — M1711 Unilateral primary osteoarthritis, right knee: Secondary | ICD-10-CM | POA: Diagnosis not present

## 2021-04-08 DIAGNOSIS — G4733 Obstructive sleep apnea (adult) (pediatric): Secondary | ICD-10-CM | POA: Diagnosis not present

## 2021-04-08 HISTORY — DX: Prediabetes: R73.03

## 2021-04-08 LAB — BASIC METABOLIC PANEL
Anion gap: 10 (ref 5–15)
BUN: 17 mg/dL (ref 8–23)
CO2: 23 mmol/L (ref 22–32)
Calcium: 9 mg/dL (ref 8.9–10.3)
Chloride: 101 mmol/L (ref 98–111)
Creatinine, Ser: 1.05 mg/dL (ref 0.61–1.24)
GFR, Estimated: >60 mL/min (ref >60)
Glucose, Bld: 389 mg/dL — ABNORMAL HIGH (ref 70–99)
Potassium: 3.4 mmol/L — ABNORMAL LOW (ref 3.5–5.1)
Sodium: 134 mmol/L — ABNORMAL LOW (ref 135–145)

## 2021-04-08 LAB — GLUCOSE, CAPILLARY: Glucose-Capillary: 346 mg/dL — ABNORMAL HIGH (ref 70–99)

## 2021-04-08 LAB — HEMOGLOBIN A1C
Hgb A1c MFr Bld: 11.1 % — ABNORMAL HIGH (ref 4.8–5.6)
Mean Plasma Glucose: 271.87 mg/dL

## 2021-04-08 LAB — CBC
HCT: 37.8 % — ABNORMAL LOW (ref 39.0–52.0)
Hemoglobin: 13.5 g/dL (ref 13.0–17.0)
MCH: 32.9 pg (ref 26.0–34.0)
MCHC: 35.7 g/dL (ref 30.0–36.0)
MCV: 92.2 fL (ref 80.0–100.0)
Platelets: 179 10*3/uL (ref 150–400)
RBC: 4.1 MIL/uL — ABNORMAL LOW (ref 4.22–5.81)
RDW: 11.9 % (ref 11.5–15.5)
WBC: 5.5 10*3/uL (ref 4.0–10.5)
nRBC: 0 % (ref 0.0–0.2)

## 2021-04-08 NOTE — Progress Notes (Signed)
Lab. Report: A1C: 11.1   CBG: 346   Glucose blood: 389

## 2021-04-08 NOTE — Progress Notes (Signed)
COVID Vaccine Completed: Yes Date COVID Vaccine completed: 2021 x 1 COVID vaccine manufacturer: [employer]   PCP - Dr. Eunice Blase Cardiologist - NO  Chest x-ray -  EKG -  Stress Test -  ECHO -  Cardiac Cath -  Pacemaker/ICD device last checked:  Sleep Study - Yes  CPAP - Yes  Fasting Blood Sugar -  Checks Blood Sugar _____ times a day  Blood Thinner Instructions: Aspirin Instructions: Last Dose:  Anesthesia review: Hx: HTN,DVT,murmur,OSA(CPAP),pre-DIA.  Patient denies shortness of breath, fever, cough and chest pain at PAT appointment   Patient verbalized understanding of instructions that were given to them at the PAT appointment. Patient was also instructed that they will need to review over the PAT instructions again at home before surgery.

## 2021-04-09 ENCOUNTER — Telehealth: Payer: Self-pay | Admitting: Orthopaedic Surgery

## 2021-04-09 NOTE — Telephone Encounter (Signed)
Patient called to say he would like to cancel his shoulder surgery until he has control of his diabetes.   He is under the care of Dr. Eunice Blase and will be following up with him until he is able to have surgery.  Pt's cb  [phone]

## 2021-04-09 NOTE — Telephone Encounter (Signed)
Thanks-yes to rescheduling when it is safer

## 2021-04-21 ENCOUNTER — Ambulatory Visit (HOSPITAL_COMMUNITY): Admission: RE | Admit: 2021-04-21 | Payer: 59 | Source: Home / Self Care | Admitting: Orthopaedic Surgery

## 2021-04-21 ENCOUNTER — Encounter (HOSPITAL_COMMUNITY): Admission: RE | Payer: Self-pay | Source: Home / Self Care

## 2021-04-21 SURGERY — SHOULDER ARTHROSCOPY WITH SUBACROMIAL DECOMPRESSION AND DISTAL CLAVICLE EXCISION
Anesthesia: Choice | Laterality: Right

## 2021-04-21 NOTE — Progress Notes (Signed)
Per PACU, no listing for this PT at [facility] today (CPAP not needed for tonight).

## 2021-04-28 ENCOUNTER — Encounter: Payer: 59 | Admitting: Orthopaedic Surgery

## 2021-05-04 ENCOUNTER — Telehealth: Payer: Self-pay | Admitting: Orthopaedic Surgery

## 2021-05-04 NOTE — Telephone Encounter (Signed)
Patient called and left a message stating he would like to schedule his right shoulder surgery again.  His last A1c on 3 wks ago on 04-08-21 was 11.1.  Please advise as to when James Gallegos can be put back on the schedule.

## 2021-05-05 NOTE — Telephone Encounter (Signed)
A1C should be 7.5 or below

## 2021-05-09 DIAGNOSIS — M1711 Unilateral primary osteoarthritis, right knee: Secondary | ICD-10-CM | POA: Diagnosis not present

## 2021-05-09 DIAGNOSIS — G4733 Obstructive sleep apnea (adult) (pediatric): Secondary | ICD-10-CM | POA: Diagnosis not present

## 2021-05-09 DIAGNOSIS — R269 Unspecified abnormalities of gait and mobility: Secondary | ICD-10-CM | POA: Diagnosis not present

## 2021-07-23 ENCOUNTER — Encounter: Payer: Self-pay | Admitting: Orthopaedic Surgery

## 2021-07-23 ENCOUNTER — Other Ambulatory Visit (HOSPITAL_COMMUNITY): Payer: Self-pay

## 2021-07-23 DIAGNOSIS — M7542 Impingement syndrome of left shoulder: Secondary | ICD-10-CM | POA: Diagnosis not present

## 2021-07-23 DIAGNOSIS — G8918 Other acute postprocedural pain: Secondary | ICD-10-CM | POA: Diagnosis not present

## 2021-07-23 DIAGNOSIS — M7521 Bicipital tendinitis, right shoulder: Secondary | ICD-10-CM | POA: Diagnosis not present

## 2021-07-23 DIAGNOSIS — M75122 Complete rotator cuff tear or rupture of left shoulder, not specified as traumatic: Secondary | ICD-10-CM | POA: Diagnosis not present

## 2021-07-23 DIAGNOSIS — M659 Synovitis and tenosynovitis, unspecified: Secondary | ICD-10-CM | POA: Diagnosis not present

## 2021-07-23 DIAGNOSIS — M19011 Primary osteoarthritis, right shoulder: Secondary | ICD-10-CM | POA: Diagnosis not present

## 2021-07-23 DIAGNOSIS — M7522 Bicipital tendinitis, left shoulder: Secondary | ICD-10-CM | POA: Diagnosis not present

## 2021-07-23 DIAGNOSIS — M19012 Primary osteoarthritis, left shoulder: Secondary | ICD-10-CM | POA: Diagnosis not present

## 2021-07-23 DIAGNOSIS — M7541 Impingement syndrome of right shoulder: Secondary | ICD-10-CM | POA: Diagnosis not present

## 2021-07-23 DIAGNOSIS — M75121 Complete rotator cuff tear or rupture of right shoulder, not specified as traumatic: Secondary | ICD-10-CM | POA: Diagnosis not present

## 2021-07-23 DIAGNOSIS — M67813 Other specified disorders of tendon, right shoulder: Secondary | ICD-10-CM | POA: Diagnosis not present

## 2021-07-23 DIAGNOSIS — M7581 Other shoulder lesions, right shoulder: Secondary | ICD-10-CM | POA: Diagnosis not present

## 2021-07-23 MED ORDER — OXYCODONE-ACETAMINOPHEN 5-325 MG PO TABS
1.0000 | ORAL_TABLET | Freq: Four times a day (QID) | ORAL | 0 refills | Status: DC | PRN
  Filled 2021-07-23: qty 30, 4d supply, fill #0

## 2021-07-27 ENCOUNTER — Telehealth: Payer: Self-pay | Admitting: Orthopaedic Surgery

## 2021-07-27 NOTE — Telephone Encounter (Signed)
Patient called needing to know when he can take a shower and can he remove the sling? Patient said the bandage is coming loose and need to know if he need to come back in th get the right shoulder bandaged again? The number to contact patient is [phone]

## 2021-07-27 NOTE — Telephone Encounter (Signed)
Tried to call patient. No answer. Left message on machine. He needs to schedule an appointment to come in and see [facility] this week.

## 2021-08-04 ENCOUNTER — Ambulatory Visit (INDEPENDENT_AMBULATORY_CARE_PROVIDER_SITE_OTHER): Payer: PPO | Admitting: Orthopaedic Surgery

## 2021-08-04 ENCOUNTER — Encounter: Payer: Self-pay | Admitting: Orthopaedic Surgery

## 2021-08-04 ENCOUNTER — Other Ambulatory Visit: Payer: Self-pay

## 2021-08-04 DIAGNOSIS — M7541 Impingement syndrome of right shoulder: Secondary | ICD-10-CM

## 2021-08-04 NOTE — Progress Notes (Addendum)
Office Visit Note   Patient: James Gallegos           Date of Birth: January 05, 1951           MRN: [id number] Visit Date: 08/04/2021              Requested by: Eunice Blase, MD [address] [place],  [place] [address] PCP: Eunice Blase, MD   Assessment & Plan: Visit Diagnoses: right shoulder pain  Plan: Patient is 12 days status post right shoulder arthroscopy repair of small rotator cuff tear and biceps tenodesis.  He is doing well pain is well controlled has no complaints.  We will order physical therapy for him he may get his shoulder wet.  He would eventually like to go to work and we said as long as he is not taking pain medication this would be fine.  We will order physical therapy to be done here.  Patient was instructed in pendulum exercises.  Follow-up in 2 weeks.  Follow-Up Instructions: No follow-ups on file.   Orders:  No orders of the defined types were placed in this encounter.  No orders of the defined types were placed in this encounter.     Procedures: No procedures performed   Clinical Data: No additional findings.   Subjective: Chief Complaint  Patient presents with   Right Shoulder - Post-op Follow-up  Patient presents today s/p right shoulder arthroscope with subacromial decompression and distal clavicle resection, possible mini open rotator cuff repair and biceps tenodesis preformed on 07/23/2021. Patient states that he has been having sore pains noticed more with movement. He states that he has noticed that his pain is decreased while he is wearing his sling. At this time patient is currently using OTC ibuprofen for pain which has been helping with his aching pains. Patient denies having any tingling or numbness. He has noticed that his right ring finger has been trigger since surgery. Sling is being used to help with comfort.     Review of Systems  All other systems reviewed and are negative.   Objective: Vital Signs: There were no  vitals taken for this visit.  Physical Exam Patient appears well pleasant to exam Ortho Exam Examination of his shoulder well-healed surgical incision minimal swelling distal CMS is intact he has no erythema no drainage from the wound.  He does have triggering of the right ring finger. Specialty Comments:  No specialty comments available.  Imaging: No results found.   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   Cervical spondylosis with myelopathy and radiculopathy 04/14/2020   Impingement syndrome of right shoulder 10/10/2019   Low back pain 10/10/2019   Irritation of ulnar nerve, left 08/28/2019   Class 2 obesity due to excess calories without serious comorbidity with body mass index (BMI) of 39.0 to 39.9 in adult 01/08/2019   Pain in right elbow 12/06/2018   Primary osteoarthritis of left knee 07/25/2018   History of left knee replacement 07/25/2018   Right foot pain 06/22/2018   Acute left-sided low back pain with left-sided sciatica 06/22/2018   Osteoarthritis 11/21/2017   Hyperlipidemia 11/21/2017   Low testosterone 09/26/2016   Healthcare maintenance 09/26/2016   S/P total knee replacement using cement, right 06/01/2016   Sleep apnea 05/19/2016   Hypertension 05/19/2016   Past Medical History:  Diagnosis Date   Arthritis    back , R foot -   Deep vein  thrombosis ([place])    after knee surgery in 2004   GERD (gastroesophageal reflux disease)    diet controlled no meds per pt   Hypertension    Murmur, cardiac    pt. 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    Pre-diabetes    Sleep apnea    CPAP- last study- 2014 cpap set on 13 per pt   Wears glasses 11/20/2020   for reading    Family History  Problem Relation  Age of Onset   Dementia Mother    Alzheimer's disease Father    Healthy Brother    Colon cancer Neg Hx    Esophageal cancer Neg Hx    Rectal cancer Neg Hx    Stomach cancer Neg Hx    Cancer Neg Hx    Diabetes Neg Hx     Past Surgical History:  Procedure Laterality Date   ANTERIOR CERVICAL DECOMP/DISCECTOMY FUSION N/A 04/14/2020   Procedure: ANTERIOR CERVICAL DECOMPRESSION/DISCECTOMY FUSION, INTERBODY PROSTHESIS, PLATE/SCREWS CERVICAL THREE-FOUR,CERVICAL FOUR-FIVE,CERVICAL SIX-SEVEN;  Surgeon: Newman Pies, MD;  Location: [facility];  Service: Neurosurgery;  Laterality: N/A;  anterior   CERVICAL SPINE SURGERY N/A 1990   C5-6 discectomy plates and screws in neck per pt   colonscopy  2017   polyps removed per pt follow up in 5 years   FINGER SURGERY Right 2008   x 3 index finger   KNEE ARTHROSCOPY Bilateral    L 2004, R 2005   MASS EXCISION Right 01/18/2017   Procedure: EXCISION BENIGN RIGHT NECK MASS;  Surgeon: Rozetta Nunnery, MD;  Location: [facility];  Service: ENT;  Laterality: Right;   MASS EXCISION Bilateral 03/23/2019   Procedure: EXCISIONS OF RIGHT CHEEK LESION, RIGHT POSTERIOR NECK LIPOMA  AND LEFT NECK NODULE;  Surgeon: Rozetta Nunnery, MD;  Location: [facility];  Service: ENT;  Laterality: Bilateral;   [place] Right 06/01/2016   Procedure: TOTAL KNEE ARTHROPLASTY;  Surgeon: [name], MD;  Location: [facility];  Service: Orthopedics;  Laterality: Right;   TOTAL KNEE ARTHROPLASTY Left 07/25/2018   Procedure: LEFT TOTAL KNEE ARTHROPLASTY;  Surgeon: [name], MD;  Location: [facility];  Service: Orthopedics;  Laterality: Left;   ULNAR NERVE TRANSPOSITION Left 11/25/2020   Procedure: ulnar nerve decompression left elbow;  Surgeon: [name], MD;  Location: [facility];  Service: Orthopedics;  Laterality: Left;   Social History   Occupational History   Not on file  Tobacco Use    Smoking status: Never   Smokeless tobacco: Never  Vaping Use   Vaping Use: Never used  Substance and Sexual Activity   Alcohol use: Yes    Comment: rarely wine   Drug use: Never   Sexual activity: Not on file

## 2021-08-12 ENCOUNTER — Ambulatory Visit: Payer: PPO | Admitting: Physical Therapy

## 2021-08-12 ENCOUNTER — Encounter: Payer: Self-pay | Admitting: Physical Therapy

## 2021-08-12 ENCOUNTER — Other Ambulatory Visit: Payer: Self-pay

## 2021-08-12 DIAGNOSIS — R293 Abnormal posture: Secondary | ICD-10-CM

## 2021-08-12 DIAGNOSIS — R600 Localized edema: Secondary | ICD-10-CM

## 2021-08-12 DIAGNOSIS — M25611 Stiffness of right shoulder, not elsewhere classified: Secondary | ICD-10-CM

## 2021-08-12 DIAGNOSIS — M25511 Pain in right shoulder: Secondary | ICD-10-CM | POA: Diagnosis not present

## 2021-08-12 DIAGNOSIS — M6281 Muscle weakness (generalized): Secondary | ICD-10-CM | POA: Diagnosis not present

## 2021-08-12 NOTE — Therapy (Signed)
OUTPATIENT PHYSICAL THERAPY SHOULDER EVALUATION   Patient Name: James Gallegos MRN: [id number] DOB:11-Apr-1951, 71 y.o., male Today's Date: 08/12/2021   PT End of Session - 08/12/21 1122     Visit Number 1    Number of Visits 16    Date for PT Re-Evaluation 10/07/21    Authorization Type HTA $30 copay    Progress Note Due on Visit 10    PT Start Time 1022    PT Stop Time 1100    PT Time Calculation (min) 38 min    Activity Tolerance Patient tolerated treatment well    Behavior During Therapy WFL for tasks assessed/performed             Past Medical History:  Diagnosis Date   Arthritis    back , R foot -   Deep vein thrombosis ([place])    after knee surgery in 2004   GERD (gastroesophageal reflux disease)    diet controlled no meds per pt   Hypertension    Murmur, cardiac    pt. 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    Pre-diabetes    Sleep apnea    CPAP- last study- 2014 cpap set on 13 per pt   Wears glasses 11/20/2020   for reading   Past Surgical History:  Procedure Laterality Date   ANTERIOR CERVICAL DECOMP/DISCECTOMY FUSION N/A 04/14/2020   Procedure: ANTERIOR CERVICAL DECOMPRESSION/DISCECTOMY FUSION, INTERBODY PROSTHESIS, PLATE/SCREWS CERVICAL THREE-FOUR,CERVICAL FOUR-FIVE,CERVICAL SIX-SEVEN;  Surgeon: Newman Pies, MD;  Location: [place];  Service: Neurosurgery;  Laterality: N/A;  anterior   CERVICAL SPINE SURGERY N/A 1990   C5-6 discectomy plates and screws in neck per pt   colonscopy  2017   polyps removed per pt follow up in 5 years   FINGER SURGERY Right 2008   x 3 index finger   KNEE ARTHROSCOPY Bilateral    L 2004, R 2005   MASS EXCISION Right 01/18/2017   Procedure: EXCISION BENIGN RIGHT NECK MASS;  Surgeon: Rozetta Nunnery, MD;  Location: [place];  Service: ENT;  Laterality: Right;   MASS EXCISION Bilateral 03/23/2019   Procedure: EXCISIONS OF RIGHT CHEEK LESION, RIGHT POSTERIOR NECK LIPOMA  AND LEFT NECK NODULE;  Surgeon: Rozetta Nunnery, MD;  Location: [place];  Service: ENT;  Laterality: Bilateral;   [place] Right 06/01/2016   Procedure: TOTAL KNEE ARTHROPLASTY;  Surgeon: [name], MD;  Location: [place];  Service: Orthopedics;  Laterality: Right;   TOTAL KNEE ARTHROPLASTY Left 07/25/2018   Procedure: LEFT TOTAL KNEE ARTHROPLASTY;  Surgeon: [name], MD;  Location: [place];  Service: Orthopedics;  Laterality: Left;   ULNAR NERVE TRANSPOSITION Left 11/25/2020   Procedure: ulnar nerve decompression left elbow;  Surgeon: [name], MD;  Location: [place];  Service: Orthopedics;  Laterality: Left;   Patient Active Problem List   Diagnosis Date Noted   Neuropathy, 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   Cervical spondylosis with myelopathy and radiculopathy 04/14/2020   Impingement syndrome of right shoulder 10/10/2019   Low back pain 10/10/2019   Irritation of ulnar nerve, left 08/28/2019   Class 2 obesity due to excess calories without serious comorbidity with body mass index (BMI)  of 39.0 to 39.9 in adult 01/08/2019   Pain in right elbow 12/06/2018   Primary osteoarthritis of left knee 07/25/2018   History of left knee replacement 07/25/2018   Right foot pain 06/22/2018   Acute left-sided low back pain with left-sided sciatica 06/22/2018   Osteoarthritis 11/21/2017   Hyperlipidemia 11/21/2017   Low testosterone 09/26/2016   Healthcare maintenance 09/26/2016   S/P total knee replacement using cement, right 06/01/2016   Sleep apnea 05/19/2016   Hypertension 05/19/2016    PCP: Eunice Blase, MD  REFERRING PROVIDER: Persons, Bevely Palmer, PA  REFERRING DIAG: M75.41 (ICD-10-CM) - Impingement syndrome of right shoulder   THERAPY  DIAG:  Acute pain of right shoulder - Plan: PT plan of care cert/re-cert  Stiffness of right shoulder, not elsewhere classified - Plan: PT plan of care cert/re-cert  Abnormal posture - Plan: PT plan of care cert/re-cert  Muscle weakness (generalized) - Plan: PT plan of care cert/re-cert  Localized edema - Plan: PT plan of care cert/re-cert   ONSET DATE: 11/14/12  SUBJECTIVE:                                                                                                                                                                                      SUBJECTIVE STATEMENT: Pt is s/p Rt RTC repair and biceps tenodesis on 07/23/21.  Presents today with arm in sling and allowed to take out of sling for light computer work.  PERTINENT HISTORY: HTN, Lt knee OA, obesity, ACDF  PAIN:  Are you having pain? Yes NPRS scale: 3/10, at best 0-1/10, up to 8/10 Pain location: shoulder Pain orientation: Right  PAIN TYPE: acute Pain description:  sore   Aggravating factors: increased activity (working at computer), occasionally worse at night Relieving factors: meds, rest  PRECAUTIONS: Other: PROM only  WEIGHT BEARING RESTRICTIONS Yes NWB RUE  FALLS:  Has patient fallen in last 6 months? No Number of falls: N/A  LIVING ENVIRONMENT: Lives with: lives with their spouse and 71 y/o grandson Lives in: House/apartment Needs min A with washing hair, otherwise able to perform ADLs mod I  OCCUPATION: Reviews appraisals - seated computer work  PLOF: Independent and Leisure: reading, watching TV  PATIENT GOALS regain use of RUE  OBJECTIVE:   NEXT MD: 08/20/21  PATIENT SURVEYS:  08/12/21: FOTO 41 (predicted 2)  COGNITION:  Overall cognitive status: Within functional limits for tasks assessed     SENSATION:  Light touch: Appears intact    POSTURE: Rounded shoulders  UPPER EXTREMITY AROM/PROM: passive only  A/PROM Right 08/12/2021 Left 08/12/2021  Shoulder flexion 121   Shoulder  extension    Shoulder abduction 97  Shoulder adduction    Shoulder internal rotation 86 (45 deg abdct)   Shoulder external rotation 48 (45 deg abdct)   Elbow flexion    Elbow extension    Wrist flexion    Wrist extension    Wrist ulnar deviation    Wrist radial deviation    Wrist pronation    Wrist supination    (Blank rows = not tested)  UPPER EXTREMITY MMT: deferred today due to post op status  MMT Right 08/12/2021 Left 08/12/2021  Shoulder flexion    Shoulder extension    Shoulder abduction    Shoulder adduction    Shoulder internal rotation    Shoulder external rotation    Middle trapezius    Lower trapezius    Elbow flexion    Elbow extension    Wrist flexion    Wrist extension    Wrist ulnar deviation    Wrist radial deviation    Wrist pronation    Wrist supination    Grip strength (lbs)    (Blank rows = not tested)   PALPATION:  deferred   TODAY'S TREATMENT:  08/12/21: see HEP; performed trial reps PRN for comprehension   PATIENT EDUCATION: Education details: HEP Person educated: Patient Education method: Consulting civil engineer, Media planner, and Handouts Education comprehension: verbalized understanding, returned demonstration, and needs further education   HOME EXERCISE PROGRAM: Access Code: [facility] URL: [email or web address] Date: 08/12/2021 Prepared by: Faustino Congress  Exercises Circular Shoulder Pendulum with Table Support - 1 x daily - 7 x weekly - 3 sets - 10 reps Flexion-Extension Shoulder Pendulum with Table Support - 1 x daily - 7 x weekly - 3 sets - 10 reps Horizontal Shoulder Pendulum with Table Support - 1 x daily - 7 x weekly - 3 sets - 10 reps Supine Elbow Flexion Extension AROM - 1 x daily - 7 x weekly - 3 sets - 10 reps Seated Scapular Retraction - 2 x daily - 7 x weekly - 1 sets - 10 reps - 5 sec hold Seated Shoulder Flexion Towel Slide at Table Top - 1 x daily - 7 x weekly - 3 sets - 10 reps Seated Shoulder Abduction  Towel Slide at Table Top - 1 x daily - 7 x weekly - 3 sets - 10 reps   ASSESSMENT:  CLINICAL IMPRESSION: Patient is a 71 y.o. male who was seen today for physical therapy evaluation and treatment for Rt shoulder arthroscopy repair of small RTC tear and biceps tenodesis.    OBJECTIVE IMPAIRMENTS decreased ROM, decreased strength, increased edema, increased fascial restrictions, impaired UE functional use, postural dysfunction, and pain.   ACTIVITY LIMITATIONS cleaning, community activity, driving, meal prep, occupation, laundry, and yard work.   PERSONAL FACTORS Past/current experiences and 3+ comorbidities: HTN, Lt knee OA, obesity, ACDF  are also affecting patient's functional outcome.    REHAB POTENTIAL: Good  CLINICAL DECISION MAKING: Stable/uncomplicated  EVALUATION COMPLEXITY: Low   GOALS: Goals reviewed with patient? Yes  SHORT TERM GOALS:  STG Name Target Date Goal status  1 Independent with initial HEP Baseline:  09/09/2021 INITIAL  2 Rt shoulder PROM improved 15 deg all motions for improved function Baseline:  09/09/2021 INITIAL  3 Report pain < 5/10 with sleeping for improved function Baseline: 09/09/2021 INITIAL   LONG TERM GOALS:   LTG Name Target Date Goal status  1 Independent with final HEP Baseline: 10/07/2021 INITIAL  2 FOTO score improved to 64 for improved function Baseline: 10/07/2021 INITIAL  3 Rt shoulder AROM improved  to WNL for improved function Baseline: 10/07/2021 INITIAL  4 Report pain < 2/10 with sleeping and ADLs for improved function  Baseline: 10/07/2021 INITIAL  5 Rt shoulder strength improved to at least 4/5  Baseline: 10/07/2021 INITIAL    PLAN: PT FREQUENCY: 1-2x/week  PT DURATION: 8 weeks  PLANNED INTERVENTIONS: Therapeutic exercises, Therapeutic activity, Neuromuscular re-education, Patient/Family education, Joint mobilization, Aquatic Therapy, Dry Needling, Electrical stimulation, Cryotherapy, Moist heat, scar mobilization, Taping,  Vasopneumatic device, and Manual therapy  PLAN FOR NEXT SESSION: continue PROM exercises for now (spoke with Dr. Durward Fortes, he is recommending Rt shoulder PROM until MD clears for progression)   Laureen Abrahams, PT, DPT 08/12/21 11:25 AM

## 2021-08-17 NOTE — Therapy (Signed)
OUTPATIENT PHYSICAL THERAPY TREATMENT NOTE   Patient Name: James Gallegos MRN: [id number] DOB:1951/04/02, 71 y.o., male Today's Date: 08/18/2021  PCP: Eunice Blase, MD REFERRING PROVIDER: Eunice Blase, MD   PT End of Session - 08/18/21 0859     Visit Number 2    Number of Visits 16    Date for PT Re-Evaluation 10/07/21    Authorization Type HTA $30 copay    Progress Note Due on Visit 10    PT Start Time 0804    PT Stop Time 0850    PT Time Calculation (min) 46 min    Activity Tolerance Patient tolerated treatment well    Behavior During Therapy [facility] for tasks assessed/performed             Past Medical History:  Diagnosis Date   Arthritis    back , R foot -   Deep vein thrombosis ([place])    after knee surgery in 2004   GERD (gastroesophageal reflux disease)    diet controlled no meds per pt   Hypertension    Murmur, cardiac    pt. 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    Pre-diabetes    Sleep apnea    CPAP- last study- 2014 cpap set on 13 per pt   Wears glasses 11/20/2020   for reading   Past Surgical History:  Procedure Laterality Date   ANTERIOR CERVICAL DECOMP/DISCECTOMY FUSION N/A 04/14/2020   Procedure: ANTERIOR CERVICAL DECOMPRESSION/DISCECTOMY FUSION, INTERBODY PROSTHESIS, PLATE/SCREWS CERVICAL THREE-FOUR,CERVICAL FOUR-FIVE,CERVICAL SIX-SEVEN;  Surgeon: Newman Pies, MD;  Location: [place];  Service: Neurosurgery;  Laterality: N/A;  anterior   CERVICAL SPINE SURGERY N/A 1990   C5-6 discectomy plates and screws in neck per pt   colonscopy  2017   polyps removed per pt follow up in 5 years   FINGER SURGERY Right 2008   x 3 index finger   KNEE ARTHROSCOPY Bilateral    L 2004, R 2005   MASS EXCISION Right 01/18/2017   Procedure: EXCISION BENIGN RIGHT NECK  MASS;  Surgeon: Rozetta Nunnery, MD;  Location: [place];  Service: ENT;  Laterality: Right;   MASS EXCISION Bilateral 03/23/2019   Procedure: EXCISIONS OF RIGHT CHEEK LESION, RIGHT POSTERIOR NECK LIPOMA  AND LEFT NECK NODULE;  Surgeon: Rozetta Nunnery, MD;  Location: [place];  Service: ENT;  Laterality: Bilateral;   [place] Right 06/01/2016   Procedure: TOTAL KNEE ARTHROPLASTY;  Surgeon: [name], MD;  Location: [place];  Service: Orthopedics;  Laterality: Right;   TOTAL KNEE ARTHROPLASTY Left 07/25/2018   Procedure: LEFT TOTAL KNEE ARTHROPLASTY;  Surgeon: [name], MD;  Location: [place];  Service: Orthopedics;  Laterality: Left;   ULNAR NERVE TRANSPOSITION Left 11/25/2020   Procedure: ulnar nerve decompression left elbow;  Surgeon: [name], MD;  Location: [place];  Service: Orthopedics;  Laterality: Left;   Patient Active Problem List   Diagnosis Date Noted   Neuropathy, 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   Cervical spondylosis with myelopathy and radiculopathy 04/14/2020   Impingement syndrome of right shoulder 10/10/2019   Low back pain 10/10/2019   Irritation of ulnar nerve, left 08/28/2019   Class 2 obesity due to  excess calories without serious comorbidity with body mass index (BMI) of 39.0 to 39.9 in adult 01/08/2019   Pain in right elbow 12/06/2018   Primary osteoarthritis of left knee 07/25/2018   History of left knee replacement 07/25/2018   Right foot pain 06/22/2018   Acute left-sided low back pain with left-sided sciatica 06/22/2018   Osteoarthritis 11/21/2017   Hyperlipidemia 11/21/2017   Low testosterone 09/26/2016   Healthcare maintenance 09/26/2016   S/P total knee replacement using cement, right 06/01/2016   Sleep apnea 05/19/2016   Hypertension 05/19/2016    REFERRING DIAG: M75.41 (ICD-10-CM) - Impingement syndrome of right shoulder   THERAPY DIAG:  Acute  pain of right shoulder  Stiffness of right shoulder, not elsewhere classified  Abnormal posture  Muscle weakness (generalized)  Localized edema  PERTINENT HISTORY: HTN, Lt knee OA, obesity, ACDF  PRECAUTIONS: PROM only  SUBJECTIVE: Pt arriving today with right arm in sling. Pt stating his Rt shoulder is in 3/10.   PAIN:  Are you having pain? Yes NPRS scale:3/10 Pain location: shoulder Pain orientation: Right  PAIN TYPE: acute Pain description:  sore   Aggravating factors: increased activity (working at computer), occasionally worse at night Relieving factors: meds, rest     OBJECTIVE:    NEXT MD: 08/20/21   PATIENT SURVEYS:  08/12/21: FOTO 41 (predicted 53)   COGNITION:          Overall cognitive status: Within functional limits for tasks assessed                               SENSATION:          Light touch: Appears intact             POSTURE: Rounded shoulders   UPPER EXTREMITY AROM/PROM: passive only   PROM Right 08/12/2021 Rt 08/18/2021  Shoulder flexion 121 140   Shoulder extension      Shoulder abduction 97 125   Shoulder adduction      Shoulder internal rotation 86 (45 deg abduction)    Shoulder external rotation 48 (45 deg abduction) 65 (45 deg abduction)   Elbow flexion      Elbow extension      Wrist flexion      Wrist extension      Wrist ulnar deviation      Wrist radial deviation      Wrist pronation      Wrist supination      (Blank rows = not tested)   UPPER EXTREMITY MMT: deferred today due to post op status   MMT Right 08/12/2021 Left 08/12/2021  Shoulder flexion      Shoulder extension      Shoulder abduction      Shoulder adduction      Shoulder internal rotation      Shoulder external rotation      Middle trapezius      Lower trapezius      Elbow flexion      Elbow extension      Wrist flexion      Wrist extension      Wrist ulnar deviation      Wrist radial deviation      Wrist pronation      Wrist supination      Grip  strength (lbs)      (Blank rows = not tested)     PALPATION:  deferred  TODAY'S TREATMENT:   08/18/2021:  Therapeutic Exercise: (PROM only) Pendulums: clockwise, counter clockwise, forward/back, side to side x 1 minute each Table slides: flexion, scaption, ER all x 15 holding 5-10 seconds Shoulder cradle: Rt shoulder x 10 Manual:   PROM to Rt shoulder    Grade 2-3 GH AP mobs Modalities:  Vasopneumatic x 10 minutes, 34 degrees, low compression    08/12/21: see HEP; performed trial reps PRN for comprehension     PATIENT EDUCATION: Education details: HEP Person educated: Patient Education method: Consulting civil engineer, Media planner, and Handouts Education comprehension: verbalized understanding, returned demonstration, and needs further education     HOME EXERCISE PROGRAM: Access Code: [facility] URL: [email or web address] Date: 08/12/2021 Prepared by: Faustino Congress   Exercises Circular Shoulder Pendulum with Table Support - 1 x daily - 7 x weekly - 3 sets - 10 reps Flexion-Extension Shoulder Pendulum with Table Support - 1 x daily - 7 x weekly - 3 sets - 10 reps Horizontal Shoulder Pendulum with Table Support - 1 x daily - 7 x weekly - 3 sets - 10 reps Supine Elbow Flexion Extension AROM - 1 x daily - 7 x weekly - 3 sets - 10 reps Seated Scapular Retraction - 2 x daily - 7 x weekly - 1 sets - 10 reps - 5 sec hold Seated Shoulder Flexion Towel Slide at Table Top - 1 x daily - 7 x weekly - 3 sets - 10 reps Seated Shoulder Abduction Towel Slide at Table Top - 1 x daily - 7 x weekly - 3 sets - 10 reps     ASSESSMENT:   CLINICAL IMPRESSION:  08/18/2021: Pt arriving today reporting 3/10 pain in his Rt shoulder. Pt reporting pain is more anterior shoulder and bicep. Pt tolerating passive ROM exercises and manual therapy well. Pt stating everyday he feels like he is improving. Continue with PROM only until released by Dr. Durward Fortes. Pt has an appointment on  Thursday.     Patient is a 71 y.o. male who was seen today for physical therapy evaluation and treatment for Rt shoulder arthroscopy repair of small RTC tear and biceps tenodesis.      OBJECTIVE IMPAIRMENTS decreased ROM, decreased strength, increased edema, increased fascial restrictions, impaired UE functional use, postural dysfunction, and pain.    ACTIVITY LIMITATIONS cleaning, community activity, driving, meal prep, occupation, laundry, and yard work.    PERSONAL FACTORS Past/current experiences and 3+ comorbidities: HTN, Lt knee OA, obesity, ACDF  are also affecting patient's functional outcome.      REHAB POTENTIAL: Good   CLINICAL DECISION MAKING: Stable/uncomplicated   EVALUATION COMPLEXITY: Low     GOALS: Goals reviewed with patient? Yes   SHORT TERM GOALS:   STG Name Target Date Goal status  1 Independent with initial HEP Baseline:  09/09/2021 Ongoing 08/18/2021  2 Rt shoulder PROM improved 15 deg all motions for improved function Baseline:  09/09/2021 Ongoing 08/18/2021  3 Report pain < 5/10 with sleeping for improved function Baseline: 09/09/2021 Ongoing 08/18/2021    LONG TERM GOALS:    LTG Name Target Date Goal status  1 Independent with final HEP Baseline: 10/07/2021 INITIAL  2 FOTO score improved to 64 for improved function Baseline: 10/07/2021 INITIAL  3 Rt shoulder AROM improved to WNL for improved function Baseline: 10/07/2021 INITIAL  4 Report pain < 2/10 with sleeping and ADLs for improved function  Baseline: 10/07/2021 INITIAL  5 Rt shoulder strength improved to at least 4/5  Baseline: 10/07/2021 INITIAL  PLAN: PT FREQUENCY: 1-2x/week   PT DURATION: 8 weeks   PLANNED INTERVENTIONS: Therapeutic exercises, Therapeutic activity, Neuromuscular re-education, Patient/Family education, Joint mobilization, Aquatic Therapy, Dry Needling, Electrical stimulation, Cryotherapy, Moist heat, scar mobilization, Taping, Vasopneumatic device, and Manual therapy    PLAN FOR NEXT SESSION: continue PROM exercises for now (spoke with Dr. Durward Fortes, he is recommending Rt shoulder PROM until MD clears for progression)        Oretha Caprice, PT, MPT 08/18/2021, 9:00 AM

## 2021-08-18 ENCOUNTER — Ambulatory Visit (INDEPENDENT_AMBULATORY_CARE_PROVIDER_SITE_OTHER): Payer: PPO | Admitting: Physical Therapy

## 2021-08-18 ENCOUNTER — Other Ambulatory Visit: Payer: Self-pay

## 2021-08-18 ENCOUNTER — Encounter: Payer: Self-pay | Admitting: Physical Therapy

## 2021-08-18 DIAGNOSIS — M25511 Pain in right shoulder: Secondary | ICD-10-CM | POA: Diagnosis not present

## 2021-08-18 DIAGNOSIS — R293 Abnormal posture: Secondary | ICD-10-CM

## 2021-08-18 DIAGNOSIS — R600 Localized edema: Secondary | ICD-10-CM | POA: Diagnosis not present

## 2021-08-18 DIAGNOSIS — M6281 Muscle weakness (generalized): Secondary | ICD-10-CM | POA: Diagnosis not present

## 2021-08-18 DIAGNOSIS — M25611 Stiffness of right shoulder, not elsewhere classified: Secondary | ICD-10-CM | POA: Diagnosis not present

## 2021-08-20 ENCOUNTER — Other Ambulatory Visit: Payer: Self-pay

## 2021-08-20 ENCOUNTER — Ambulatory Visit (INDEPENDENT_AMBULATORY_CARE_PROVIDER_SITE_OTHER): Payer: PPO | Admitting: Orthopaedic Surgery

## 2021-08-20 ENCOUNTER — Encounter: Payer: Self-pay | Admitting: Orthopaedic Surgery

## 2021-08-20 DIAGNOSIS — M7541 Impingement syndrome of right shoulder: Secondary | ICD-10-CM

## 2021-08-20 NOTE — Progress Notes (Signed)
? ?Office Visit Note ?  ?Patient: James Gallegos           ?Date of Birth: 1951-02-23           ?MRN: [id number] ?Visit Date: 08/20/2021 ?             ?Requested by: Eunice Blase, MD ?[address] ?[place],  [place] [address] ?PCP: Eunice Blase, MD ? ? ?Assessment & Plan: ?Visit Diagnoses: 1 month right shoulder scope rcr repair and biceps tenodesis ? ?Plan: Patient is 1 month status post right shoulder arthroscopy decompression rotator cuff repair and biceps tenodesis.  He has been working with physical therapy on passive and assistive active range of motion.  Overall he feels like he is doing well.  Still gets awoken at night from pain.  Denies any fever or chills.  He will continue with physical therapy we will follow-up in 2 weeks we have told him he may discontinue his sling at home but should wear it when he is out and about when he returns in 2 weeks we will update therapy orders for more active range of motion ? ?Follow-Up Instructions: No follow-ups on file.  ? ?Orders:  ?No orders of the defined types were placed in this encounter. ? ?No orders of the defined types were placed in this encounter. ? ? ? ? Procedures: ?No procedures performed ? ? ?Clinical Data: ?No additional findings. ? ? ?Subjective: ?Chief Complaint  ?Patient presents with  ? Right Shoulder - Follow-up  ?  Arthroscopy 07/23/2021  ?Patient presents today for a two week follow up on his right shoulder. He had a right shoulder arthroscopy on 07/23/2021. He is now 1 month out from surgery. He states that his pain varies from day to day. He is wearing his sling and doing physical therapy.  He is also complaining of a painful trigger finger in the right ring finger.  He has been applying Voltaren.  He says this is gotten quite bothersome to him.  We offered an injection today but he would like to defer that for now. ? ? ? ?Review of Systems  ?All other systems reviewed and are negative. ? ? ?Objective: ?Vital Signs: There were no  vitals taken for this visit. ? ?Physical Exam ?Patient appears well seated in chair appropriate to exam ?Ortho Exam ?Right shoulder well-healed surgical incisions distal CMS is intact.  He does have a triggering of the right fourth ring finger.  He has passive and assisted forward elevation to 175 degrees he has passive abduction and external rotation.  Still some tenderness over the biceps.  No erythema no cellulitis or signs of infection ?Specialty Comments:  ?No specialty comments available. ? ?Imaging: ?No results found. ? ? ?PMFS History: ?Patient Active Problem List  ? Diagnosis Date Noted  ? 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 Cervical spondylosis with myelopathy and radiculopathy 04/14/2020  ? Impingement syndrome of right shoulder 10/10/2019  ? Low back pain 10/10/2019  ? Irritation of ulnar nerve, left 08/28/2019  ? Class 2 obesity due to excess calories without serious comorbidity with body mass index (BMI) of 39.0 to 39.9 in adult 01/08/2019  ? Pain in right elbow 12/06/2018  ? Primary osteoarthritis of left knee 07/25/2018  ? History of left knee replacement 07/25/2018  ? Right foot pain 06/22/2018  ? Acute left-sided low back pain with left-sided sciatica 06/22/2018  ? Osteoarthritis 11/21/2017  ? Hyperlipidemia 11/21/2017  ? Low testosterone 09/26/2016  ? Healthcare maintenance 09/26/2016  ?  S/P total knee replacement using cement, right 06/01/2016  ? Sleep apnea 05/19/2016  ? Hypertension 05/19/2016  ? ?Past Medical History:  ?Diagnosis Date  ? Arthritis   ? back , R foot -  ? Deep vein thrombosis ([place])   ? after knee surgery in 2004  ? GERD (gastroesophageal reflux disease)   ? diet controlled no meds per pt  ? Hypertension   ? Murmur, cardiac   ? pt. reports its difficult to auscultate   ? 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 little finger and ring finger numb on side by little finger numbness/tingling /pain all the time per pt  ? Pneumonia   ? back in college days.   ?  Pre-diabetes   ? Sleep apnea   ? CPAP- last study- 2014 cpap set on 13 per pt  ? Wears glasses 11/20/2020  ? for reading  ?  ?Family History  ?Problem Relation Age of Onset  ? Dementia Mother   ? Alzheimer's disease Father   ? Healthy Brother   ? Colon cancer Neg Hx   ? Esophageal cancer Neg Hx   ? Rectal cancer Neg Hx   ? Stomach cancer Neg Hx   ? Cancer Neg Hx   ? Diabetes Neg Hx   ?  ?Past Surgical History:  ?Procedure Laterality Date  ? ANTERIOR CERVICAL DECOMP/DISCECTOMY FUSION N/A 04/14/2020  ? Procedure: ANTERIOR CERVICAL DECOMPRESSION/DISCECTOMY FUSION, INTERBODY PROSTHESIS, PLATE/SCREWS CERVICAL THREE-FOUR,CERVICAL FOUR-FIVE,CERVICAL SIX-SEVEN;  Surgeon: Newman Pies, MD;  Location: [facility];  Service: Neurosurgery;  Laterality: N/A;  anterior  ? [place] N/A 1990  ? C5-6 discectomy plates and screws in neck per pt  ? colonscopy  2017  ? polyps removed per pt follow up in 5 years  ? FINGER SURGERY Right 2008  ? x 3 index finger  ? KNEE ARTHROSCOPY Bilateral   ? L 2004, R 2005  ? MASS EXCISION Right 01/18/2017  ? Procedure: EXCISION BENIGN RIGHT NECK MASS;  Surgeon: Rozetta Nunnery, MD;  Location: [facility];  Service: ENT;  Laterality: Right;  ? MASS EXCISION Bilateral 03/23/2019  ? Procedure: EXCISIONS OF RIGHT CHEEK LESION, RIGHT POSTERIOR NECK LIPOMA  AND LEFT NECK NODULE;  Surgeon: Rozetta Nunnery, MD;  Location: [facility];  Service: ENT;  Laterality: Bilateral;  ? NASAL FRACTURE SURGERY  1980  ? TOTAL KNEE ARTHROPLASTY Right 06/01/2016  ? Procedure: TOTAL KNEE ARTHROPLASTY;  Surgeon: [name], MD;  Location: [facility];  Service: Orthopedics;  Laterality: Right;  ? TOTAL KNEE ARTHROPLASTY Left 07/25/2018  ? Procedure: LEFT TOTAL KNEE ARTHROPLASTY;  Surgeon: [name], MD;  Location: [facility];  Service: Orthopedics;  Laterality: Left;  ? ULNAR NERVE TRANSPOSITION Left 11/25/2020  ? Procedure: ulnar nerve decompression left elbow;   Surgeon: [name], MD;  Location: [facility];  Service: Orthopedics;  Laterality: Left;  ? ?Social History  ? ?Occupational History  ? Not on file  ?Tobacco Use  ? Smoking status: Never  ? Smokeless tobacco: Never  ?Vaping Use  ? Vaping Use: Never used  ?Substance and Sexual Activity  ? Alcohol use: Yes  ?  Comment: rarely wine  ? Drug use: Never  ? Sexual activity: Not on file  ? ? ? ? ? ? ?

## 2021-08-24 ENCOUNTER — Other Ambulatory Visit: Payer: Self-pay

## 2021-08-24 ENCOUNTER — Ambulatory Visit (INDEPENDENT_AMBULATORY_CARE_PROVIDER_SITE_OTHER): Payer: PPO | Admitting: Physical Therapy

## 2021-08-24 ENCOUNTER — Encounter: Payer: Self-pay | Admitting: Physical Therapy

## 2021-08-24 DIAGNOSIS — M25611 Stiffness of right shoulder, not elsewhere classified: Secondary | ICD-10-CM | POA: Diagnosis not present

## 2021-08-24 DIAGNOSIS — R293 Abnormal posture: Secondary | ICD-10-CM | POA: Diagnosis not present

## 2021-08-24 DIAGNOSIS — M25511 Pain in right shoulder: Secondary | ICD-10-CM

## 2021-08-24 DIAGNOSIS — M6281 Muscle weakness (generalized): Secondary | ICD-10-CM

## 2021-08-24 DIAGNOSIS — R600 Localized edema: Secondary | ICD-10-CM

## 2021-08-24 NOTE — Therapy (Signed)
OUTPATIENT PHYSICAL THERAPY TREATMENT NOTE   Patient Name: James Gallegos MRN: [id number] DOB:01/08/1951, 71 y.o., male Today's Date: 08/24/2021  PCP: Eunice Blase, MD REFERRING PROVIDER: Persons, Bevely Palmer, [place]   PT End of Session - 08/24/21 1037     Visit Number 3    Number of Visits 16    Date for PT Re-Evaluation 10/07/21    Authorization Type HTA $30 copay    Progress Note Due on Visit 10    PT Start Time 0936    PT Stop Time 1006    PT Time Calculation (min) 30 min    Activity Tolerance Patient tolerated treatment well    Behavior During Therapy [facility] for tasks assessed/performed              Past Medical History:  Diagnosis Date   Arthritis    back , R foot -   Deep vein thrombosis ([place])    after knee surgery in 2004   GERD (gastroesophageal reflux disease)    diet controlled no meds per pt   Hypertension    Murmur, cardiac    pt. 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    Pre-diabetes    Sleep apnea    CPAP- last study- 2014 cpap set on 13 per pt   Wears glasses 11/20/2020   for reading   Past Surgical History:  Procedure Laterality Date   ANTERIOR CERVICAL DECOMP/DISCECTOMY FUSION N/A 04/14/2020   Procedure: ANTERIOR CERVICAL DECOMPRESSION/DISCECTOMY FUSION, INTERBODY PROSTHESIS, PLATE/SCREWS CERVICAL THREE-FOUR,CERVICAL FOUR-FIVE,CERVICAL SIX-SEVEN;  Surgeon: Newman Pies, MD;  Location: [facility];  Service: Neurosurgery;  Laterality: N/A;  anterior   CERVICAL SPINE SURGERY N/A 1990   C5-6 discectomy plates and screws in neck per pt   colonscopy  2017   polyps removed per pt follow up in 5 years   FINGER SURGERY Right 2008   x 3 index finger   KNEE ARTHROSCOPY Bilateral    L 2004, R 2005   MASS EXCISION Right 01/18/2017   Procedure: EXCISION BENIGN  RIGHT NECK MASS;  Surgeon: Rozetta Nunnery, MD;  Location: [facility];  Service: ENT;  Laterality: Right;   MASS EXCISION Bilateral 03/23/2019   Procedure: EXCISIONS OF RIGHT CHEEK LESION, RIGHT POSTERIOR NECK LIPOMA  AND LEFT NECK NODULE;  Surgeon: Rozetta Nunnery, MD;  Location: [facility];  Service: ENT;  Laterality: Bilateral;   [place] Right 06/01/2016   Procedure: TOTAL KNEE ARTHROPLASTY;  Surgeon: [name], MD;  Location: [facility];  Service: Orthopedics;  Laterality: Right;   TOTAL KNEE ARTHROPLASTY Left 07/25/2018   Procedure: LEFT TOTAL KNEE ARTHROPLASTY;  Surgeon: [name], MD;  Location: [facility];  Service: Orthopedics;  Laterality: Left;   ULNAR NERVE TRANSPOSITION Left 11/25/2020   Procedure: ulnar nerve decompression left elbow;  Surgeon: [name], MD;  Location: [facility];  Service: Orthopedics;  Laterality: Left;   Patient Active Problem List   Diagnosis Date Noted   Neuropathy, 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   Cervical spondylosis with myelopathy and radiculopathy 04/14/2020   Impingement syndrome of right shoulder 10/10/2019   Low back pain 10/10/2019   Irritation of ulnar nerve, left 08/28/2019   Class 2 obesity  due to excess calories without serious comorbidity with body mass index (BMI) of 39.0 to 39.9 in adult 01/08/2019   Pain in right elbow 12/06/2018   Primary osteoarthritis of left knee 07/25/2018   History of left knee replacement 07/25/2018   Right foot pain 06/22/2018   Acute left-sided low back pain with left-sided sciatica 06/22/2018   Osteoarthritis 11/21/2017   Hyperlipidemia 11/21/2017   Low testosterone 09/26/2016   Healthcare maintenance 09/26/2016   S/P total knee replacement using cement, right 06/01/2016   Sleep apnea 05/19/2016   Hypertension 05/19/2016    REFERRING DIAG: M75.41 (ICD-10-CM) - Impingement syndrome of right shoulder   THERAPY  DIAG:  Acute pain of right shoulder  Stiffness of right shoulder, not elsewhere classified  Abnormal posture  Muscle weakness (generalized)  Localized edema  PERTINENT HISTORY: HTN, Lt knee OA, obesity, ACDF  PRECAUTIONS: PROM only  SUBJECTIVE: sore today, may be due to the weather  PAIN:  Are you having pain? Yes NPRS scale:2/10 Pain location: shoulder Pain orientation: Right  PAIN TYPE: acute Pain description:  sore   Aggravating factors: increased activity (working at computer), occasionally worse at night Relieving factors: meds, rest     OBJECTIVE:    NEXT MD: 08/20/21   PATIENT SURVEYS:  08/12/21: FOTO 41 (predicted 15)   COGNITION:          Overall cognitive status: Within functional limits for tasks assessed                               SENSATION:          Light touch: Appears intact             POSTURE: Rounded shoulders   UPPER EXTREMITY AROM/PROM: passive only   PROM Right 08/12/2021 Rt 08/18/2021 Right 08/24/21  Shoulder flexion 121 140  146  Shoulder extension       Shoulder abduction 97 125  140  Shoulder adduction       Shoulder internal rotation 86 (45 deg abduction)     Shoulder external rotation 48 (45 deg abduction) 65 (45 deg abduction)  64 (45 deg abduction)   Elbow flexion       Elbow extension       Wrist flexion       Wrist extension       Wrist ulnar deviation       Wrist radial deviation       Wrist pronation       Wrist supination       (Blank rows = not tested)   UPPER EXTREMITY MMT: deferred today due to post op status   MMT Right 08/12/2021 Left 08/12/2021  Shoulder flexion      Shoulder extension      Shoulder abduction      Shoulder adduction      Shoulder internal rotation      Shoulder external rotation      Middle trapezius      Lower trapezius      Elbow flexion      Elbow extension      Wrist flexion      Wrist extension      Wrist ulnar deviation      Wrist radial deviation      Wrist pronation       Wrist supination      Grip strength (lbs)      (Blank rows = not tested)  PALPATION:  deferred            TODAY'S TREATMENT:  08/24/21: Manual:   PROM to Rt shoulder    Grade 2-3 GH AP mobs Therex:   Shoulder rolls x 10 reps backward   Scap retraction 10 x 5 sec hold   08/18/2021:  Therapeutic Exercise: (PROM only) Pendulums: clockwise, counter clockwise, forward/back, side to side x 1 minute each Table slides: flexion, scaption, ER all x 15 holding 5-10 seconds Shoulder cradle: Rt shoulder x 10 Manual:   PROM to Rt shoulder    Grade 2-3 GH AP mobs Modalities:  Vasopneumatic x 10 minutes, 34 degrees, low compression    08/12/21: see HEP; performed trial reps PRN for comprehension     PATIENT EDUCATION: Education details: HEP Person educated: Patient Education method: Consulting civil engineer, Media planner, and Handouts Education comprehension: verbalized understanding, returned demonstration, and needs further education     HOME EXERCISE PROGRAM: Access Code: [facility] URL: [email or web address] Date: 08/12/2021 Prepared by: Faustino Congress   Exercises Circular Shoulder Pendulum with Table Support - 1 x daily - 7 x weekly - 3 sets - 10 reps Flexion-Extension Shoulder Pendulum with Table Support - 1 x daily - 7 x weekly - 3 sets - 10 reps Horizontal Shoulder Pendulum with Table Support - 1 x daily - 7 x weekly - 3 sets - 10 reps Supine Elbow Flexion Extension AROM - 1 x daily - 7 x weekly - 3 sets - 10 reps Seated Scapular Retraction - 2 x daily - 7 x weekly - 1 sets - 10 reps - 5 sec hold Seated Shoulder Flexion Towel Slide at Table Top - 1 x daily - 7 x weekly - 3 sets - 10 reps Seated Shoulder Abduction Towel Slide at Table Top - 1 x daily - 7 x weekly - 3 sets - 10 reps     ASSESSMENT:   CLINICAL IMPRESSION: Pt demonstrating improved PROM today in flexion and abduction.  Overall doing well with PT.  Plan to see 1x/wk until able to further progress  activity.   Patient is a 71 y.o. male who was seen today for physical therapy evaluation and treatment for Rt shoulder arthroscopy repair of small RTC tear and biceps tenodesis.      OBJECTIVE IMPAIRMENTS decreased ROM, decreased strength, increased edema, increased fascial restrictions, impaired UE functional use, postural dysfunction, and pain.    ACTIVITY LIMITATIONS cleaning, community activity, driving, meal prep, occupation, laundry, and yard work.    PERSONAL FACTORS Past/current experiences and 3+ comorbidities: HTN, Lt knee OA, obesity, ACDF  are also affecting patient's functional outcome.      REHAB POTENTIAL: Good   CLINICAL DECISION MAKING: Stable/uncomplicated   EVALUATION COMPLEXITY: Low     GOALS: Goals reviewed with patient? Yes   SHORT TERM GOALS:   STG Name Target Date Goal status  1 Independent with initial HEP Baseline:  09/09/2021 Met 08/24/21  2 Rt shoulder PROM improved 15 deg all motions for improved function Baseline:  09/09/2021 Met 08/24/21  3 Report pain < 5/10 with sleeping for improved function Baseline: 09/09/2021 Ongoing 08/18/2021    LONG TERM GOALS:    LTG Name Target Date Goal status  1 Independent with final HEP Baseline: 10/07/2021 INITIAL  2 FOTO score improved to 64 for improved function Baseline: 10/07/2021 INITIAL  3 Rt shoulder AROM improved to WNL for improved function Baseline: 10/07/2021 INITIAL  4 Report pain < 2/10 with sleeping and ADLs for improved function  Baseline: 10/07/2021  INITIAL  5 Rt shoulder strength improved to at least 4/5  Baseline: 10/07/2021 INITIAL      PLAN: PT FREQUENCY: 1-2x/week   PT DURATION: 8 weeks   PLANNED INTERVENTIONS: Therapeutic exercises, Therapeutic activity, Neuromuscular re-education, Patient/Family education, Joint mobilization, Aquatic Therapy, Dry Needling, Electrical stimulation, Cryotherapy, Moist heat, scar mobilization, Taping, Vasopneumatic device, and Manual therapy   PLAN FOR  NEXT SESSION: continue PROM exercises for now (spoke with Dr. Durward Fortes, he is recommending Rt shoulder PROM until MD clears for progression)        Faustino Congress, PT, DPT 08/24/2021, 10:37 AM

## 2021-08-26 ENCOUNTER — Encounter: Payer: PPO | Admitting: Physical Therapy

## 2021-08-31 ENCOUNTER — Other Ambulatory Visit (HOSPITAL_COMMUNITY): Payer: Self-pay

## 2021-08-31 ENCOUNTER — Encounter: Payer: Self-pay | Admitting: Physical Therapy

## 2021-08-31 ENCOUNTER — Other Ambulatory Visit: Payer: Self-pay

## 2021-08-31 ENCOUNTER — Ambulatory Visit (INDEPENDENT_AMBULATORY_CARE_PROVIDER_SITE_OTHER): Payer: PPO | Admitting: Physical Therapy

## 2021-08-31 DIAGNOSIS — R600 Localized edema: Secondary | ICD-10-CM | POA: Diagnosis not present

## 2021-08-31 DIAGNOSIS — R293 Abnormal posture: Secondary | ICD-10-CM

## 2021-08-31 DIAGNOSIS — M25511 Pain in right shoulder: Secondary | ICD-10-CM | POA: Diagnosis not present

## 2021-08-31 DIAGNOSIS — M25611 Stiffness of right shoulder, not elsewhere classified: Secondary | ICD-10-CM | POA: Diagnosis not present

## 2021-08-31 DIAGNOSIS — M6281 Muscle weakness (generalized): Secondary | ICD-10-CM | POA: Diagnosis not present

## 2021-08-31 MED ORDER — VALACYCLOVIR HCL 1 G PO TABS
1000.0000 mg | ORAL_TABLET | Freq: Three times a day (TID) | ORAL | 3 refills | Status: DC
  Filled 2021-08-31: qty 21, 7d supply, fill #0

## 2021-08-31 NOTE — Therapy (Signed)
?OUTPATIENT PHYSICAL THERAPY TREATMENT NOTE ? ? ?Patient Name: James Gallegos ?MRN: [id number] ?DOB:[birth year], 71 y.o., male ?Today's Date: 08/31/2021 ? ?PCP: Eunice Blase, MD ?REFERRING PROVIDER: Persons, Bevely Palmer, [place] ? ? PT End of Session - 08/31/21 0347   ? ? Visit Number 4   ? Number of Visits 16   ? Date for PT Re-Evaluation 10/07/21   ? Authorization Type HTA $30 copay   ? Progress Note Due on Visit 10   ? PT Start Time [phone]   ? PT Stop Time 0917   ? PT Time Calculation (min) 28 min   ? Activity Tolerance Patient tolerated treatment well   ? Behavior During Therapy [facility] for tasks assessed/performed   ? ?  ?  ? ?  ? ? ? ? ?Past Medical History:  ?Diagnosis Date  ? Arthritis   ? back , R foot -  ? Deep vein thrombosis ([place])   ? after knee surgery in 2004  ? GERD (gastroesophageal reflux disease)   ? diet controlled no meds per pt  ? Hypertension   ? Murmur, cardiac   ? pt. reports its difficult to auscultate   ? 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 little finger and ring finger numb on side by little finger numbness/tingling /pain all the time per pt  ? Pneumonia   ? back in college days.   ? Pre-diabetes   ? Sleep apnea   ? CPAP- last study- 2014 cpap set on 13 per pt  ? Wears glasses 11/20/2020  ? for reading  ? ?Past Surgical History:  ?Procedure Laterality Date  ? ANTERIOR CERVICAL DECOMP/DISCECTOMY FUSION N/A 04/14/2020  ? Procedure: ANTERIOR CERVICAL DECOMPRESSION/DISCECTOMY FUSION, INTERBODY PROSTHESIS, PLATE/SCREWS CERVICAL THREE-FOUR,CERVICAL FOUR-FIVE,CERVICAL SIX-SEVEN;  Surgeon: Newman Pies, MD;  Location: [facility];  Service: Neurosurgery;  Laterality: N/A;  anterior  ? [place] N/A 1990  ? C5-6 discectomy plates and screws in neck per pt  ? colonscopy  2017  ? polyps removed per pt follow up in 5 years  ? FINGER SURGERY Right 2008  ? x 3 index finger  ? KNEE ARTHROSCOPY Bilateral   ? L 2004, R 2005  ? MASS EXCISION Right 01/18/2017  ? Procedure: EXCISION BENIGN  RIGHT NECK MASS;  Surgeon: Rozetta Nunnery, MD;  Location: [facility];  Service: ENT;  Laterality: Right;  ? MASS EXCISION Bilateral 03/23/2019  ? Procedure: EXCISIONS OF RIGHT CHEEK LESION, RIGHT POSTERIOR NECK LIPOMA  AND LEFT NECK NODULE;  Surgeon: Rozetta Nunnery, MD;  Location: [facility] [place];  Service: ENT;  Laterality: Bilateral;  ? NASAL FRACTURE SURGERY  1980  ? TOTAL KNEE ARTHROPLASTY Right 06/01/2016  ? Procedure: TOTAL KNEE ARTHROPLASTY;  Surgeon: [name], MD;  Location: [facility];  Service: Orthopedics;  Laterality: Right;  ? TOTAL KNEE ARTHROPLASTY Left 07/25/2018  ? Procedure: LEFT TOTAL KNEE ARTHROPLASTY;  Surgeon: [name], MD;  Location: [facility];  Service: Orthopedics;  Laterality: Left;  ? ULNAR NERVE TRANSPOSITION Left 11/25/2020  ? Procedure: ulnar nerve decompression left elbow;  Surgeon: [name], MD;  Location: [place];  Service: Orthopedics;  Laterality: Left;  ? ?Patient Active Problem List  ? Diagnosis Date Noted  ? 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 Cervical spondylosis with myelopathy and radiculopathy 04/14/2020  ? Impingement syndrome of right shoulder 10/10/2019  ? Low back pain 10/10/2019  ? Irritation of ulnar nerve, left 08/28/2019  ? Class 2  obesity due to excess calories without serious comorbidity with body mass index (BMI) of 39.0 to 39.9 in adult 01/08/2019  ? Pain in right elbow 12/06/2018  ? Primary osteoarthritis of left knee 07/25/2018  ? History of left knee replacement 07/25/2018  ? Right foot pain 06/22/2018  ? Acute left-sided low back pain with left-sided sciatica 06/22/2018  ? Osteoarthritis 11/21/2017  ? Hyperlipidemia 11/21/2017  ? Low testosterone 09/26/2016  ? Healthcare maintenance 09/26/2016  ? S/P total knee replacement using cement, right 06/01/2016  ? Sleep apnea 05/19/2016  ? Hypertension 05/19/2016  ? ? ?REFERRING DIAG: M75.41 (ICD-10-CM) - Impingement syndrome of right shoulder  ? ?THERAPY  DIAG:  ?Acute pain of right shoulder ? ?Stiffness of right shoulder, not elsewhere classified ? ?Abnormal posture ? ?Muscle weakness (generalized) ? ?Localized edema ? ?PERTINENT HISTORY: HTN, Lt knee OA, obesity, ACDF ? ?PRECAUTIONS: PROM only ? ?SUBJECTIVE: Lt posterior hip/glutes is sore today, thinks he's irritated it getting in/out of bed ?PAIN:  ?Are you having pain? No ?NPRS scale: 0/10 ?Pain location: shoulder ?Pain orientation: Right  ?PAIN TYPE: acute ?Pain description:  sore   ?Aggravating factors: increased activity (working at computer), occasionally worse at night ?Relieving factors: meds, rest ? ? ? ? ?OBJECTIVE:  ?  ?NEXT MD: 09/03/21 ?  ?PATIENT SURVEYS:  ?08/12/21: FOTO 41 (predicted 64) ?08/31/21 FOTO 55 ?  ?COGNITION: ?         Overall cognitive status: Within functional limits for tasks assessed ?                              ?SENSATION: ?         Light touch: Appears intact ?          ?  ?POSTURE: ?Rounded shoulders ?  ?UPPER EXTREMITY AROM/PROM: passive only ?  ?PROM Right ?08/12/2021 Rt ?08/18/2021 Right ?08/24/21 Right ?08/31/21  ?Shoulder flexion 121 140  146 153  ?Shoulder extension        ?Shoulder abduction 97 125  140 161  ?Shoulder adduction        ?Shoulder internal rotation 86 (45 deg abduction)    90 (70 deg abduction)  ?Shoulder external rotation 48 (45 deg abduction) 65 (45 deg abduction)  64 (45 deg abduction)  60 (70 deg abduction)  ?(Blank rows = not tested) ?  ?UPPER EXTREMITY MMT: deferred today due to post op status ?  ?MMT Right ?08/12/2021 Left ?08/12/2021  ?Shoulder flexion      ?Shoulder extension      ?Shoulder abduction      ?Shoulder adduction      ?Shoulder internal rotation      ?Shoulder external rotation      ?Middle trapezius      ?Lower trapezius      ?Elbow flexion      ?Elbow extension      ?Wrist flexion      ?Wrist extension      ?Wrist ulnar deviation      ?Wrist radial deviation      ?Wrist pronation      ?Wrist supination      ?Grip strength (lbs)      ?(Blank rows =  not tested) ?  ?  ?PALPATION:  ?deferred ?           ?TODAY'S TREATMENT:  ?08/31/21 ?Manual:   ?PROM to Rt shoulder ?   Grade 2-3 GH AP mobs ?Therex: ?  Shoulder rolls  x 10 reps backward ?  Scap retraction 10 x 5 sec hold ? ?08/24/21: ?Manual:   ?PROM to Rt shoulder ?   Grade 2-3 GH AP mobs ?Therex: ?  Shoulder rolls x 10 reps backward ?  Scap retraction 10 x 5 sec hold ? ? ?08/18/2021:  ?Therapeutic Exercise: (PROM only) ?Pendulums: clockwise, counter clockwise, forward/back, side to side x 1 minute each ?Table slides: flexion, scaption, ER all x 15 holding 5-10 seconds ?Shoulder cradle: Rt shoulder x 10 ?Manual:   ?PROM to Rt shoulder ?   Grade 2-3 GH AP mobs ?Modalities:  ?Vasopneumatic x 10 minutes, 34 degrees, low compression ? ? ? ?08/12/21: see HEP; performed trial reps PRN for comprehension ?  ?  ?PATIENT EDUCATION: ?Education details: HEP ?Person educated: Patient ?Education method: Explanation, Demonstration, and Handouts ?Education comprehension: verbalized understanding, returned demonstration, and needs further education ?  ?  ?HOME EXERCISE PROGRAM: ?Access Code: [email or web address] ?URL: [email or web address] ?Date: 08/12/2021 ?Prepared by: Faustino Congress ?  ?Exercises ?Circular Shoulder Pendulum with Table Support - 1 x daily - 7 x weekly - 3 sets - 10 reps ?Flexion-Extension Shoulder Pendulum with Table Support - 1 x daily - 7 x weekly - 3 sets - 10 reps ?Horizontal Shoulder Pendulum with Table Support - 1 x daily - 7 x weekly - 3 sets - 10 reps ?Supine Elbow Flexion Extension AROM - 1 x daily - 7 x weekly - 3 sets - 10 reps ?Seated Scapular Retraction - 2 x daily - 7 x weekly - 1 sets - 10 reps - 5 sec hold ?Seated Shoulder Flexion Towel Slide at Table Top - 1 x daily - 7 x weekly - 3 sets - 10 reps ?Seated Shoulder Abduction Towel Slide at Table Top - 1 x daily - 7 x weekly - 3 sets - 10 reps ?  ?  ?ASSESSMENT: ?  ?CLINICAL IMPRESSION: ?Pt has demonstrated steady progress with PROM at this time.   Anticipate we will be able to progress to AROM and light strengthening at next MD appt later this week.  Will continue to benefit from PT to maximize function. ? ?Patient is a 71 y.o. male who was seen tod

## 2021-09-03 ENCOUNTER — Ambulatory Visit (INDEPENDENT_AMBULATORY_CARE_PROVIDER_SITE_OTHER): Payer: PPO | Admitting: Orthopaedic Surgery

## 2021-09-03 ENCOUNTER — Other Ambulatory Visit: Payer: Self-pay

## 2021-09-03 ENCOUNTER — Ambulatory Visit (INDEPENDENT_AMBULATORY_CARE_PROVIDER_SITE_OTHER): Payer: PPO

## 2021-09-03 ENCOUNTER — Encounter: Payer: PPO | Admitting: Physical Therapy

## 2021-09-03 ENCOUNTER — Encounter: Payer: Self-pay | Admitting: Orthopaedic Surgery

## 2021-09-03 DIAGNOSIS — M25552 Pain in left hip: Secondary | ICD-10-CM

## 2021-09-03 DIAGNOSIS — M7541 Impingement syndrome of right shoulder: Secondary | ICD-10-CM

## 2021-09-03 NOTE — Progress Notes (Signed)
? ?Office Visit Note ?  ?Patient: James Gallegos           ?Date of Birth: Nov 05, 1950           ?MRN: [id number] ?Visit Date: 09/03/2021 ?             ?Requested by: Eunice Blase, MD ?[address] ?[place],  [place] [address] ?PCP: Eunice Blase, MD ? ? ?Assessment & Plan: ?Visit Diagnoses:  ?1. Pain in left hip   ?2. Impingement syndrome of right shoulder   ? ? ?Plan: 6-week status post right shoulder arthroscopic debridement with a mini open rotator cuff tear repair and biceps tenodesis.  Doing quite well and is finishing a course of therapy upstairs.  He notes not having any of the pain that he had preoperatively.  He is able to range his arm over his head and appears to have good strength.  He will finish his course of therapy and work on strengthening.  I will check him back in a month.  He no longer needs the sling. ? ?He also notes that for the past several weeks has had trouble with pain along the lateral aspect of his left hip and this is completely "new".  He has not had any groin pain and the pain does not radiate beyond his left knee.  We will obtain x-rays of his hips.  He did have an MRI scan in 2020 with degenerative disc disease and facet disease throughout the lower thoracic and lumbar spine.  There was no convincing compressive stenosis at any level.  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  I discussed Advil and Aleve but will have Dr. Ernestina Patches evaluate him for possible intra-articular cortisone injection ? ?Follow-Up Instructions: Return in about 1 month (around 10/04/2021).  ? ?Orders:  ?Orders Placed This Encounter  ?Procedures  ? XR HIP UNILAT W OR W/O PELVIS 2-3 VIEWS LEFT  ? ?No orders of the defined types were placed in this encounter. ? ? ? ? Procedures: ?No procedures performed ? ? ?Clinical Data: ?No  additional findings. ? ? ?Subjective: ?Chief Complaint  ?Patient presents with  ? Right Shoulder - Follow-up  ?  Right shoulder arthroscopy 07/23/2021  ?Patient presents today for follow up on his right shoulder. He had a right shoulder arthroscopy on 07/23/2021. He is now 6 weeks out from surgery. Patient states that his shoulder is doing well. He has been doing to physical therapy. He does have complaints today for left hip and buttock pain. He said that it is hurting all the time, and worse with any prolonged sitting.  ? ?HPI ? ?Review of Systems ? ? ?Objective: ?Vital Signs: There were no vitals taken for this visit. ? ?Physical Exam ?Constitutional:   ?   Appearance: He is well-developed.  ?Pulmonary:  ?   Effort: Pulmonary effort is normal.  ?Skin: ?   General: Skin is warm and dry.  ?Neurological:  ?   Mental Status: He is alert and oriented to person, place, and time.  ?Psychiatric:     ?   Behavior: Behavior normal.  ? ? ?Ortho Exam awake and alert and comfortable in no acute distress.  He could easily raise his right arm fully over his head and no pain with impingement testing.  Good grip and good  release.  Arthroscopic portals and mini incision have healed without a problem. ? ?Straight leg raise negative.  He did  have pain in his left groin  with internal/external rotation.  No significant tenderness over the greater trochanter left hip.  No percussible tenderness of his lumbar spine.  Neurologically intact.  Straight leg raise negative. ? ?Specialty Comments:  ?No specialty comments available. ? ?Imaging: ?No results found. ? ? ?PMFS History: ?Patient Active Problem List  ? Diagnosis Date Noted  ? Pain in left hip 09/03/2021  ? 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 Cervical spondylosis with myelopathy and radiculopathy 04/14/2020  ? Impingement syndrome of right shoulder 10/10/2019  ? Low back pain 10/10/2019  ? Irritation of ulnar nerve, left 08/28/2019  ? Class 2 obesity due to excess calories without  serious comorbidity with body mass index (BMI) of 39.0 to 39.9 in adult 01/08/2019  ? Pain in right elbow 12/06/2018  ? Primary osteoarthritis of left knee 07/25/2018  ? History of left knee replacement 07/25/2018  ? Right foot pain 06/22/2018  ? Acute left-sided low back pain with left-sided sciatica 06/22/2018  ? Osteoarthritis 11/21/2017  ? Hyperlipidemia 11/21/2017  ? Low testosterone 09/26/2016  ? Healthcare maintenance 09/26/2016  ? S/P total knee replacement using cement, right 06/01/2016  ? Sleep apnea 05/19/2016  ? Hypertension 05/19/2016  ? ?Past Medical History:  ?Diagnosis Date  ? Arthritis   ? back , R foot -  ? Deep vein thrombosis ([place])   ? after knee surgery in 2004  ? GERD (gastroesophageal reflux disease)   ? diet controlled no meds per pt  ? Hypertension   ? Murmur, cardiac   ? pt. reports its difficult to auscultate   ? 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 little finger and ring finger numb on side by little finger numbness/tingling /pain all the time per pt  ? Pneumonia   ? back in college days.   ? Pre-diabetes   ? Sleep apnea   ? CPAP- last study- 2014 cpap set on 13 per pt  ? Wears glasses 11/20/2020  ? for reading  ?  ?Family History  ?Problem Relation Age of Onset  ? Dementia Mother   ? Alzheimer's disease Father   ? Healthy Brother   ? Colon cancer Neg Hx   ? Esophageal cancer Neg Hx   ? Rectal cancer Neg Hx   ? Stomach cancer Neg Hx   ? Cancer Neg Hx   ? Diabetes Neg Hx   ?  ?Past Surgical History:  ?Procedure Laterality Date  ? ANTERIOR CERVICAL DECOMP/DISCECTOMY FUSION N/A 04/14/2020  ? Procedure: ANTERIOR CERVICAL DECOMPRESSION/DISCECTOMY FUSION, INTERBODY PROSTHESIS, PLATE/SCREWS CERVICAL THREE-FOUR,CERVICAL FOUR-FIVE,CERVICAL SIX-SEVEN;  Surgeon: Newman Pies, MD;  Location: [facility];  Service: Neurosurgery;  Laterality: N/A;  anterior  ? [place] N/A 1990  ? C5-6 discectomy plates and screws in neck per pt  ? colonscopy  2017  ? polyps removed per pt  follow up in 5 years  ? FINGER SURGERY Right 2008  ? x 3 index finger  ? KNEE ARTHROSCOPY Bilateral   ? L 2004, R 2005  ? MASS EXCISION Right 01/18/2017  ? Procedure: EXCISION BENIGN RIGHT NECK MASS;  Surgeon: Rozetta Nunnery, MD;  Location: [facility];  Service: ENT;  Laterality: Right;  ? MASS EXCISION Bilateral 03/23/2019  ? Procedure: EXCISIONS OF RIGHT CHEEK LESION, RIGHT POSTERIOR NECK LIPOMA  AND LEFT NECK NODULE;  Surgeon: Melony Overly  E, MD;  Location: [facility];  Service: ENT;  Laterality: Bilateral;  ? NASAL FRACTURE SURGERY  1980  ? TOTAL KNEE ARTHROPLASTY Right 06/01/2016  ? Procedure: TOTAL KNEE ARTHROPLASTY;  Surgeon: [name], MD;  Location: [facility];  Service: Orthopedics;  Laterality: Right;  ? TOTAL KNEE ARTHROPLASTY Left 07/25/2018  ? Procedure: LEFT TOTAL KNEE ARTHROPLASTY;  Surgeon: [name], MD;  Location: [facility];  Service: Orthopedics;  Laterality: Left;  ? ULNAR NERVE TRANSPOSITION Left 11/25/2020  ? Procedure: ulnar nerve decompression left elbow;  Surgeon: [name], MD;  Location: [facility];  Service: Orthopedics;  Laterality: Left;  ? ?Social History  ? ?Occupational History  ? Not on file  ?Tobacco Use  ? Smoking status: Never  ? Smokeless tobacco: Never  ?Vaping Use  ? Vaping Use: Never used  ?Substance and Sexual Activity  ? Alcohol use: Yes  ?  Comment: rarely wine  ? Drug use: Never  ? Sexual activity: Not on file  ? ? ? ? ? ? ?

## 2021-09-06 DIAGNOSIS — G4733 Obstructive sleep apnea (adult) (pediatric): Secondary | ICD-10-CM | POA: Diagnosis not present

## 2021-09-06 DIAGNOSIS — R269 Unspecified abnormalities of gait and mobility: Secondary | ICD-10-CM | POA: Diagnosis not present

## 2021-09-06 DIAGNOSIS — M1711 Unilateral primary osteoarthritis, right knee: Secondary | ICD-10-CM | POA: Diagnosis not present

## 2021-09-08 ENCOUNTER — Ambulatory Visit: Payer: PPO | Admitting: Physical Therapy

## 2021-09-08 ENCOUNTER — Other Ambulatory Visit: Payer: Self-pay

## 2021-09-08 ENCOUNTER — Encounter: Payer: Self-pay | Admitting: Physical Therapy

## 2021-09-08 ENCOUNTER — Other Ambulatory Visit (HOSPITAL_COMMUNITY): Payer: Self-pay

## 2021-09-08 DIAGNOSIS — R600 Localized edema: Secondary | ICD-10-CM | POA: Diagnosis not present

## 2021-09-08 DIAGNOSIS — M6281 Muscle weakness (generalized): Secondary | ICD-10-CM

## 2021-09-08 DIAGNOSIS — R293 Abnormal posture: Secondary | ICD-10-CM | POA: Diagnosis not present

## 2021-09-08 DIAGNOSIS — M25511 Pain in right shoulder: Secondary | ICD-10-CM

## 2021-09-08 DIAGNOSIS — M25611 Stiffness of right shoulder, not elsewhere classified: Secondary | ICD-10-CM

## 2021-09-08 NOTE — Therapy (Signed)
?OUTPATIENT PHYSICAL THERAPY TREATMENT NOTE ? ? ?Patient Name: James Gallegos ?MRN: [id number] ?DOB:06/26/1950, 71 y.o., male ?Today's Date: 09/08/2021 ? ?PCP: Eunice Blase, MD ?REFERRING PROVIDER: [name], MD ? ? PT End of Session - 09/08/21 1013   ? ? Visit Number 5   ? Number of Visits 16   ? Date for PT Re-Evaluation 10/07/21   ? Authorization Type HTA $30 copay   ? Progress Note Due on Visit 10   ? PT Start Time 9622   ? PT Stop Time 1013   ? PT Time Calculation (min) 38 min   ? Activity Tolerance Patient tolerated treatment well   ? Behavior During Therapy [facility] for tasks assessed/performed   ? ?  ?  ? ?  ? ? ? ? ? ?Past Medical History:  ?Diagnosis Date  ? Arthritis   ? back , R foot -  ? Deep vein thrombosis ([place])   ? after knee surgery in 2004  ? GERD (gastroesophageal reflux disease)   ? diet controlled no meds per pt  ? Hypertension   ? Murmur, cardiac   ? pt. reports its difficult to auscultate   ? 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 little finger and ring finger numb on side by little finger numbness/tingling /pain all the time per pt  ? Pneumonia   ? back in college days.   ? Pre-diabetes   ? Sleep apnea   ? CPAP- last study- 2014 cpap set on 13 per pt  ? Wears glasses 11/20/2020  ? for reading  ? ?Past Surgical History:  ?Procedure Laterality Date  ? ANTERIOR CERVICAL DECOMP/DISCECTOMY FUSION N/A 04/14/2020  ? Procedure: ANTERIOR CERVICAL DECOMPRESSION/DISCECTOMY FUSION, INTERBODY PROSTHESIS, PLATE/SCREWS CERVICAL THREE-FOUR,CERVICAL FOUR-FIVE,CERVICAL SIX-SEVEN;  Surgeon: Newman Pies, MD;  Location: [facility];  Service: Neurosurgery;  Laterality: N/A;  anterior  ? [place] N/A 1990  ? C5-6 discectomy plates and screws in neck per pt  ? colonscopy  2017  ? polyps removed per pt follow up in 5 years  ? FINGER SURGERY Right 2008  ? x 3 index finger  ? KNEE ARTHROSCOPY Bilateral   ? L 2004, R 2005  ? MASS EXCISION Right 01/18/2017  ? Procedure: EXCISION BENIGN  RIGHT NECK MASS;  Surgeon: Rozetta Nunnery, MD;  Location: [facility];  Service: ENT;  Laterality: Right;  ? MASS EXCISION Bilateral 03/23/2019  ? Procedure: EXCISIONS OF RIGHT CHEEK LESION, RIGHT POSTERIOR NECK LIPOMA  AND LEFT NECK NODULE;  Surgeon: Rozetta Nunnery, MD;  Location: [facility];  Service: ENT;  Laterality: Bilateral;  ? NASAL FRACTURE SURGERY  1980  ? TOTAL KNEE ARTHROPLASTY Right 06/01/2016  ? Procedure: TOTAL KNEE ARTHROPLASTY;  Surgeon: [name], MD;  Location: [facility];  Service: Orthopedics;  Laterality: Right;  ? TOTAL KNEE ARTHROPLASTY Left 07/25/2018  ? Procedure: LEFT TOTAL KNEE ARTHROPLASTY;  Surgeon: [name], MD;  Location: [facility];  Service: Orthopedics;  Laterality: Left;  ? ULNAR NERVE TRANSPOSITION Left 11/25/2020  ? Procedure: ulnar nerve decompression left elbow;  Surgeon: [name], MD;  Location: [facility];  Service: Orthopedics;  Laterality: Left;  ? ?Patient Active Problem List  ? Diagnosis Date Noted  ? Pain in left hip 09/03/2021  ? 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 Cervical spondylosis with myelopathy and radiculopathy 04/14/2020  ? Impingement syndrome of right shoulder 10/10/2019  ? Low back pain 10/10/2019  ? Irritation of  ulnar nerve, left 08/28/2019  ? Class 2 obesity due to excess calories without serious comorbidity with body mass index (BMI) of 39.0 to 39.9 in adult 01/08/2019  ? Pain in right elbow 12/06/2018  ? Primary osteoarthritis of left knee 07/25/2018  ? History of left knee replacement 07/25/2018  ? Right foot pain 06/22/2018  ? Acute left-sided low back pain with left-sided sciatica 06/22/2018  ? Osteoarthritis 11/21/2017  ? Hyperlipidemia 11/21/2017  ? Low testosterone 09/26/2016  ? Healthcare maintenance 09/26/2016  ? S/P total knee replacement using cement, right 06/01/2016  ? Sleep apnea 05/19/2016  ? Hypertension 05/19/2016  ? ? ?REFERRING DIAG: M75.41 (ICD-10-CM) - Impingement syndrome of  right shoulder  ? ?THERAPY DIAG:  ?Acute pain of right shoulder ? ?Stiffness of right shoulder, not elsewhere classified ? ?Abnormal posture ? ?Muscle weakness (generalized) ? ?Localized edema ? ?PERTINENT HISTORY: HTN, Lt knee OA, obesity, ACDF ? ?PRECAUTIONS: okay to progress AROM/strengthening ? ?SUBJECTIVE: Doing well; MD has cleared for AROM and light strengthening ?PAIN:  ?Are you having pain? No ?NPRS scale: 0/10 ?Pain location: shoulder ?Pain orientation: Right  ?PAIN TYPE: acute ?Pain description:  sore   ?Aggravating factors: increased activity (working at computer), occasionally worse at night ?Relieving factors: meds, rest ? ? ? ? ?OBJECTIVE:  ?  ?NEXT MD: 09/03/21 ?  ?PATIENT SURVEYS:  ?08/12/21: FOTO 41 (predicted 64) ?08/31/21 FOTO 55 ?  ?COGNITION: ?         Overall cognitive status: Within functional limits for tasks assessed ?                              ?SENSATION: ?         Light touch: Appears intact ?          ?  ?POSTURE: ?Rounded shoulders ?  ?UPPER EXTREMITY AROM/PROM: passive only ?  ?PROM Right ?08/12/2021 Rt ?08/18/2021 Right ?08/24/21 Right ?08/31/21  ?Shoulder flexion 121 140  146 153  ?Shoulder extension        ?Shoulder abduction 97 125  140 161  ?Shoulder adduction        ?Shoulder internal rotation 86 (45 deg abduction)    90 (70 deg abduction)  ?Shoulder external rotation 48 (45 deg abduction) 65 (45 deg abduction)  64 (45 deg abduction)  60 (70 deg abduction)  ?(Blank rows = not tested) ? ?AROM Right ?09/08/21  ?Shoulder flexion 135 sitting  ?Shoulder extension   ?Shoulder abduction 135 sitting  ?Shoulder adduction   ?Shoulder internal rotation Rt iliac crest  ?Shoulder external rotation 80 sitting  ? ?  ?UPPER EXTREMITY MMT: deferred today due to post op status ?  ?MMT Right ?08/12/2021 Left ?08/12/2021  ?Shoulder flexion      ?Shoulder extension      ?Shoulder abduction      ?Shoulder adduction      ?Shoulder internal rotation      ?Shoulder external rotation      ?Middle trapezius       ?Lower trapezius      ?Elbow flexion      ?Elbow extension      ?Wrist flexion      ?Wrist extension      ?Wrist ulnar deviation      ?Wrist radial deviation      ?Wrist pronation      ?Wrist supination      ?Grip strength (lbs)      ?(Blank rows = not  tested) ?  ?  ?PALPATION:  ?deferred ?           ?TODAY'S TREATMENT:  ?09/08/21 ? Therex: ?AA Rt shoulder abduction with 1# bar ?AA Rt shoulder flexion with 1# bar ?Demonstrated internal rotation stretch ?Rt shoulder flexion to 90 deg 2x10; 2# ?Rt shoulder abduction to 90 deg 2x10; 2# ?Rt elbow flexion 2x10; 2# ?Rows L4 2x10 ?Shoulder ext L4 2x10 ?Rt shoulder IR L4 2x10 ?Rt shoulder ER L3 2x10 - min cues for technique ?UBE L3 x 4 min (2' each direction) ? ?08/31/21 ?Manual:   ?PROM to Rt shoulder ?   Grade 2-3 GH AP mobs ?Therex: ?  Shoulder rolls x 10 reps backward ?  Scap retraction 10 x 5 sec hold ? ?08/24/21: ?Manual:   ?PROM to Rt shoulder ?   Grade 2-3 GH AP mobs ?Therex: ?  Shoulder rolls x 10 reps backward ?  Scap retraction 10 x 5 sec hold ? ? ?PATIENT EDUCATION: ?Education details: HEP ?Person educated: Patient ?Education method: Explanation, Demonstration, and Handouts ?Education comprehension: verbalized understanding, returned demonstration, and needs further education ?  ?  ?HOME EXERCISE PROGRAM: ?Access Code: [email or web address] ?URL: [email or web address] ?Date: 09/08/2021 ?Prepared by: Faustino Congress ? ?Exercises ?- Seated Scapular Retraction  - 2 x daily - 7 x weekly - 1 sets - 10 reps - 5 sec hold ?- Standing Shoulder Abduction ROM with Dowel  - 2-3 x daily - 7 x weekly - 1-2 sets - 10 reps - 1-2 sec hold ?- Standing Shoulder Flexion AAROM with Dowel  - 2-3 x daily - 7 x weekly - 1-2 sets - 10 reps - 1-2 sec hold ?- Standing Shoulder Internal Rotation Stretch with Towel  - 2-3 x daily - 7 x weekly - 1-2 sets - 10 reps - 10 sec hold ?- Single Arm Shoulder Flexion with Dumbbell  - 1 x daily - 7 x weekly - 2-3 sets - 10 reps ?- Single Arm  Scaption with Dumbbell  - 1 x daily - 7 x weekly - 2-3 sets - 10 reps ?- Seated Single Arm Elbow Flexion with Dumbbell  - 1 x daily - 7 x weekly - 2-3 sets - 10 reps ?- Standing Row with Anchored Resistance  - 1

## 2021-09-10 ENCOUNTER — Encounter: Payer: Self-pay | Admitting: Physical Therapy

## 2021-09-10 ENCOUNTER — Ambulatory Visit (INDEPENDENT_AMBULATORY_CARE_PROVIDER_SITE_OTHER): Payer: PPO | Admitting: Physical Therapy

## 2021-09-10 DIAGNOSIS — M25511 Pain in right shoulder: Secondary | ICD-10-CM

## 2021-09-10 DIAGNOSIS — R293 Abnormal posture: Secondary | ICD-10-CM | POA: Diagnosis not present

## 2021-09-10 DIAGNOSIS — M6281 Muscle weakness (generalized): Secondary | ICD-10-CM

## 2021-09-10 DIAGNOSIS — M25611 Stiffness of right shoulder, not elsewhere classified: Secondary | ICD-10-CM | POA: Diagnosis not present

## 2021-09-10 DIAGNOSIS — R600 Localized edema: Secondary | ICD-10-CM | POA: Diagnosis not present

## 2021-09-10 NOTE — Therapy (Signed)
?OUTPATIENT PHYSICAL THERAPY TREATMENT NOTE ? ? ?Patient Name: James Gallegos ?MRN: [id number] ?DOB:19-Dec-1950, 71 y.o., male ?Today's Date: 09/10/2021 ? ?PCP: Eunice Blase, MD ?REFERRING PROVIDER: Persons, Bevely Palmer, [place] ? ? PT End of Session - 09/10/21 0850   ? ? Visit Number 6   ? Number of Visits 16   ? Date for PT Re-Evaluation 10/07/21   ? Authorization Type HTA $30 copay   ? Progress Note Due on Visit 10   ? PT Start Time 0848   ? PT Stop Time 7867   ? PT Time Calculation (min) 39 min   ? Activity Tolerance Patient tolerated treatment well   ? Behavior During Therapy [facility] for tasks assessed/performed   ? ?  ?  ? ?  ? ? ? ? ? ? ?Past Medical History:  ?Diagnosis Date  ? Arthritis   ? back , R foot -  ? Deep vein thrombosis ([place])   ? after knee surgery in 2004  ? GERD (gastroesophageal reflux disease)   ? diet controlled no meds per pt  ? Hypertension   ? Murmur, cardiac   ? pt. reports its difficult to auscultate   ? 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 little finger and ring finger numb on side by little finger numbness/tingling /pain all the time per pt  ? Pneumonia   ? back in college days.   ? Pre-diabetes   ? Sleep apnea   ? CPAP- last study- 2014 cpap set on 13 per pt  ? Wears glasses 11/20/2020  ? for reading  ? ?Past Surgical History:  ?Procedure Laterality Date  ? ANTERIOR CERVICAL DECOMP/DISCECTOMY FUSION N/A 04/14/2020  ? Procedure: ANTERIOR CERVICAL DECOMPRESSION/DISCECTOMY FUSION, INTERBODY PROSTHESIS, PLATE/SCREWS CERVICAL THREE-FOUR,CERVICAL FOUR-FIVE,CERVICAL SIX-SEVEN;  Surgeon: Newman Pies, MD;  Location: [facility];  Service: Neurosurgery;  Laterality: N/A;  anterior  ? [place] N/A 1990  ? C5-6 discectomy plates and screws in neck per pt  ? colonscopy  2017  ? polyps removed per pt follow up in 5 years  ? FINGER SURGERY Right 2008  ? x 3 index finger  ? KNEE ARTHROSCOPY Bilateral   ? L 2004, R 2005  ? MASS EXCISION Right 01/18/2017  ? Procedure: EXCISION  BENIGN RIGHT NECK MASS;  Surgeon: Rozetta Nunnery, MD;  Location: [facility];  Service: ENT;  Laterality: Right;  ? MASS EXCISION Bilateral 03/23/2019  ? Procedure: EXCISIONS OF RIGHT CHEEK LESION, RIGHT POSTERIOR NECK LIPOMA  AND LEFT NECK NODULE;  Surgeon: Rozetta Nunnery, MD;  Location: [facility];  Service: ENT;  Laterality: Bilateral;  ? NASAL FRACTURE SURGERY  1980  ? TOTAL KNEE ARTHROPLASTY Right 06/01/2016  ? Procedure: TOTAL KNEE ARTHROPLASTY;  Surgeon: [name], MD;  Location: [facility];  Service: Orthopedics;  Laterality: Right;  ? TOTAL KNEE ARTHROPLASTY Left 07/25/2018  ? Procedure: LEFT TOTAL KNEE ARTHROPLASTY;  Surgeon: [name], MD;  Location: [facility];  Service: Orthopedics;  Laterality: Left;  ? ULNAR NERVE TRANSPOSITION Left 11/25/2020  ? Procedure: ulnar nerve decompression left elbow;  Surgeon: [name], MD;  Location: [facility];  Service: Orthopedics;  Laterality: Left;  ? ?Patient Active Problem List  ? Diagnosis Date Noted  ? Pain in left hip 09/03/2021  ? 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 Cervical spondylosis with myelopathy and radiculopathy 04/14/2020  ? Impingement syndrome of right shoulder 10/10/2019  ? Low back pain 10/10/2019  ? Irritation  of ulnar nerve, left 08/28/2019  ? Class 2 obesity due to excess calories without serious comorbidity with body mass index (BMI) of 39.0 to 39.9 in adult 01/08/2019  ? Pain in right elbow 12/06/2018  ? Primary osteoarthritis of left knee 07/25/2018  ? History of left knee replacement 07/25/2018  ? Right foot pain 06/22/2018  ? Acute left-sided low back pain with left-sided sciatica 06/22/2018  ? Osteoarthritis 11/21/2017  ? Hyperlipidemia 11/21/2017  ? Low testosterone 09/26/2016  ? Healthcare maintenance 09/26/2016  ? S/P total knee replacement using cement, right 06/01/2016  ? Sleep apnea 05/19/2016  ? Hypertension 05/19/2016  ? ? ?REFERRING DIAG: M75.41 (ICD-10-CM) - Impingement  syndrome of right shoulder  ? ?THERAPY DIAG:  ?Acute pain of right shoulder ? ?Stiffness of right shoulder, not elsewhere classified ? ?Abnormal posture ? ?Muscle weakness (generalized) ? ?Localized edema ? ?PERTINENT HISTORY: HTN, Lt knee OA, obesity, ACDF ? ?PRECAUTIONS: okay to progress AROM/strengthening ? ?SUBJECTIVE: a little sore after last visit; didn't last long though  ? ?PAIN:  ?Are you having pain? No ?NPRS scale: 0/10 ?Pain location: shoulder ?Pain orientation: Right  ?PAIN TYPE: acute ?Pain description:  sore   ?Aggravating factors: increased activity (working at computer), occasionally worse at night ?Relieving factors: meds, rest ? ? ? ? ?OBJECTIVE:  ?  ?NEXT MD: 09/03/21 ?  ?PATIENT SURVEYS:  ?08/12/21: FOTO 41 (predicted 64) ?08/31/21 FOTO 55 ?  ?POSTURE: ?Rounded shoulders ?  ?UPPER EXTREMITY AROM/PROM: passive only ?  ?PROM Right ?08/12/2021 Rt ?08/18/2021 Right ?08/24/21 Right ?08/31/21  ?Shoulder flexion 121 140  146 153  ?Shoulder extension        ?Shoulder abduction 97 125  140 161  ?Shoulder adduction        ?Shoulder internal rotation 86 (45 deg abduction)    90 (70 deg abduction)  ?Shoulder external rotation 48 (45 deg abduction) 65 (45 deg abduction)  64 (45 deg abduction)  60 (70 deg abduction)  ?(Blank rows = not tested) ? ?AROM Right ?09/08/21  ?Shoulder flexion 135 sitting  ?Shoulder extension   ?Shoulder abduction 135 sitting  ?Shoulder adduction   ?Shoulder internal rotation Rt iliac crest  ?Shoulder external rotation 80 sitting  ? ?  ?UPPER EXTREMITY MMT: deferred today due to post op status ?  ?MMT Right ?08/12/2021 Left ?08/12/2021  ?Shoulder flexion      ?Shoulder extension      ?Shoulder abduction      ?Shoulder adduction      ?Shoulder internal rotation      ?Shoulder external rotation      ?Middle trapezius      ?Lower trapezius      ?Elbow flexion      ?Elbow extension      ?Wrist flexion      ?Wrist extension      ?Wrist ulnar deviation      ?Wrist radial deviation      ?Wrist pronation       ?Wrist supination      ?Grip strength (lbs)      ?(Blank rows = not tested) ?  ?  ?PALPATION:  ?deferred ?           ?TODAY'S TREATMENT:  ?09/10/21 ?Therex: ?UBE L3 x 8 min (alt 2' each direction) ?Wall ladder flexion x 5 reps; 10 sec hold at end range ?Wall ladder scaption x 5 reps; 10 sec hold at end range ?AA Rt shoulder flexion with 2# bar x 10 reps; 5 sec hold ?AA Rt  shoulder abduction with 2# bar x 10 reps; 5 sec hold ?Rt shoulder flexion to 90 deg 2x10; 2# ?Rt shoulder abduction to 90 deg 2x10; 2# ?Rt elbow flexion 2x10; 2# ?Rows L4 3x10 ?Rt shoulder IR L4 3x10 ?Rt shoulder ER L4 2x10 - min cues for technique ?Bil shoulder ext 2x10 L4 band ? ? ?09/08/21 ? Therex: ?AA Rt shoulder abduction with 1# bar ?AA Rt shoulder flexion with 1# bar ?Demonstrated internal rotation stretch ?Rt shoulder flexion to 90 deg 2x10; 2# ?Rt shoulder abduction to 90 deg 2x10; 2# ?Rt elbow flexion 2x10; 2# ?Rows L4 2x10 ?Shoulder ext L4 2x10 ?Rt shoulder IR L4 2x10 ?Rt shoulder ER L3 2x10 - min cues for technique ?UBE L3 x 4 min (2' each direction) ? ?08/31/21 ?Manual:   ?PROM to Rt shoulder ?   Grade 2-3 GH AP mobs ?Therex: ?  Shoulder rolls x 10 reps backward ?  Scap retraction 10 x 5 sec hold ? ?PATIENT EDUCATION: ?Education details: HEP ?Person educated: Patient ?Education method: Explanation, Demonstration, and Handouts ?Education comprehension: verbalized understanding, returned demonstration, and needs further education ?  ?  ?HOME EXERCISE PROGRAM: ?Access Code: [email or web address] ?URL: [email or web address] ?Date: 09/08/2021 ?Prepared by: Faustino Congress ? ?Exercises ?- Seated Scapular Retraction  - 2 x daily - 7 x weekly - 1 sets - 10 reps - 5 sec hold ?- Standing Shoulder Abduction ROM with Dowel  - 2-3 x daily - 7 x weekly - 1-2 sets - 10 reps - 1-2 sec hold ?- Standing Shoulder Flexion AAROM with Dowel  - 2-3 x daily - 7 x weekly - 1-2 sets - 10 reps - 1-2 sec hold ?- Standing Shoulder Internal Rotation Stretch  with Towel  - 2-3 x daily - 7 x weekly - 1-2 sets - 10 reps - 10 sec hold ?- Single Arm Shoulder Flexion with Dumbbell  - 1 x daily - 7 x weekly - 2-3 sets - 10 reps ?- Single Arm Scaption with Dumbbell  - 1 x

## 2021-09-15 ENCOUNTER — Ambulatory Visit: Payer: PPO | Admitting: Physical Therapy

## 2021-09-15 ENCOUNTER — Encounter: Payer: Self-pay | Admitting: Physical Therapy

## 2021-09-15 DIAGNOSIS — M6281 Muscle weakness (generalized): Secondary | ICD-10-CM | POA: Diagnosis not present

## 2021-09-15 DIAGNOSIS — M25611 Stiffness of right shoulder, not elsewhere classified: Secondary | ICD-10-CM

## 2021-09-15 DIAGNOSIS — M25511 Pain in right shoulder: Secondary | ICD-10-CM | POA: Diagnosis not present

## 2021-09-15 DIAGNOSIS — R293 Abnormal posture: Secondary | ICD-10-CM | POA: Diagnosis not present

## 2021-09-15 DIAGNOSIS — R600 Localized edema: Secondary | ICD-10-CM | POA: Diagnosis not present

## 2021-09-15 NOTE — Therapy (Signed)
?OUTPATIENT PHYSICAL THERAPY TREATMENT NOTE ? ? ?Patient Name: James Gallegos ?MRN: [id number] ?DOB:February 21, 1951, 71 y.o., male ?Today's Date: 09/15/2021 ? ?PCP: Eunice Blase, MD ?REFERRING PROVIDER: Persons, Bevely Palmer, [place] ? ? PT End of Session - 09/15/21 0850   ? ? Visit Number 7   ? Number of Visits 16   ? Date for PT Re-Evaluation 10/07/21   ? Authorization Type HTA $30 copay   ? Progress Note Due on Visit 10   ? PT Start Time [phone]   ? PT Stop Time 0263   ? PT Time Calculation (min) 38 min   ? Activity Tolerance Patient tolerated treatment well   ? Behavior During Therapy [facility] for tasks assessed/performed   ? ?  ?  ? ?  ? ? ? ? ? ? ? ?Past Medical History:  ?Diagnosis Date  ? Arthritis   ? back , R foot -  ? Deep vein thrombosis ([place])   ? after knee surgery in 2004  ? GERD (gastroesophageal reflux disease)   ? diet controlled no meds per pt  ? Hypertension   ? Murmur, cardiac   ? pt. reports its difficult to auscultate   ? 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 little finger and ring finger numb on side by little finger numbness/tingling /pain all the time per pt  ? Pneumonia   ? back in college days.   ? Pre-diabetes   ? Sleep apnea   ? CPAP- last study- 2014 cpap set on 13 per pt  ? Wears glasses 11/20/2020  ? for reading  ? ?Past Surgical History:  ?Procedure Laterality Date  ? ANTERIOR CERVICAL DECOMP/DISCECTOMY FUSION N/A 04/14/2020  ? Procedure: ANTERIOR CERVICAL DECOMPRESSION/DISCECTOMY FUSION, INTERBODY PROSTHESIS, PLATE/SCREWS CERVICAL THREE-FOUR,CERVICAL FOUR-FIVE,CERVICAL SIX-SEVEN;  Surgeon: Newman Pies, MD;  Location: [place];  Service: Neurosurgery;  Laterality: N/A;  anterior  ? [place] N/A 1990  ? C5-6 discectomy plates and screws in neck per pt  ? colonscopy  2017  ? polyps removed per pt follow up in 5 years  ? FINGER SURGERY Right 2008  ? x 3 index finger  ? KNEE ARTHROSCOPY Bilateral   ? L 2004, R 2005  ? MASS EXCISION Right 01/18/2017  ? Procedure: EXCISION  BENIGN RIGHT NECK MASS;  Surgeon: Rozetta Nunnery, MD;  Location: [place];  Service: ENT;  Laterality: Right;  ? MASS EXCISION Bilateral 03/23/2019  ? Procedure: EXCISIONS OF RIGHT CHEEK LESION, RIGHT POSTERIOR NECK LIPOMA  AND LEFT NECK NODULE;  Surgeon: Rozetta Nunnery, MD;  Location: [place];  Service: ENT;  Laterality: Bilateral;  ? NASAL FRACTURE SURGERY  1980  ? TOTAL KNEE ARTHROPLASTY Right 06/01/2016  ? Procedure: TOTAL KNEE ARTHROPLASTY;  Surgeon: [name], MD;  Location: [place];  Service: Orthopedics;  Laterality: Right;  ? TOTAL KNEE ARTHROPLASTY Left 07/25/2018  ? Procedure: LEFT TOTAL KNEE ARTHROPLASTY;  Surgeon: [name], MD;  Location: [place];  Service: Orthopedics;  Laterality: Left;  ? ULNAR NERVE TRANSPOSITION Left 11/25/2020  ? Procedure: ulnar nerve decompression left elbow;  Surgeon: [name], MD;  Location: [place];  Service: Orthopedics;  Laterality: Left;  ? ?Patient Active Problem List  ? Diagnosis Date Noted  ? Pain in left hip 09/03/2021  ? 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 Cervical spondylosis with myelopathy and radiculopathy 04/14/2020  ? Impingement syndrome of right shoulder 10/10/2019  ? Low back pain 10/10/2019  ?  Irritation of ulnar nerve, left 08/28/2019  ? Class 2 obesity due to excess calories without serious comorbidity with body mass index (BMI) of 39.0 to 39.9 in adult 01/08/2019  ? Pain in right elbow 12/06/2018  ? Primary osteoarthritis of left knee 07/25/2018  ? History of left knee replacement 07/25/2018  ? Right foot pain 06/22/2018  ? Acute left-sided low back pain with left-sided sciatica 06/22/2018  ? Osteoarthritis 11/21/2017  ? Hyperlipidemia 11/21/2017  ? Low testosterone 09/26/2016  ? Healthcare maintenance 09/26/2016  ? S/P total knee replacement using cement, right 06/01/2016  ? Sleep apnea 05/19/2016  ? Hypertension 05/19/2016  ? ? ?REFERRING DIAG: M75.41 (ICD-10-CM) - Impingement  syndrome of right shoulder  ? ?THERAPY DIAG:  ?Acute pain of right shoulder ? ?Stiffness of right shoulder, not elsewhere classified ? ?Abnormal posture ? ?Localized edema ? ?Muscle weakness (generalized) ? ?PERTINENT HISTORY: HTN, Lt knee OA, obesity, ACDF ? ?PRECAUTIONS: okay to progress AROM/strengthening ? ?SUBJECTIVE: hand behind back is the only exercise that seems to be uncomfortable; Lt hip has gotten increasingly more painful   ? ?PAIN:  ?Are you having pain? No ?NPRS scale: 0/10 ?Pain location: shoulder ?Pain orientation: Right  ?PAIN TYPE: acute ?Pain description:  sore   ?Aggravating factors: increased activity (working at computer), occasionally worse at night ?Relieving factors: meds, rest ? ? ? ? ?OBJECTIVE:  ?  ?NEXT MD: 10/07/21 ?  ?PATIENT SURVEYS:  ?08/12/21: FOTO 41 (predicted 64) ?08/31/21 FOTO 55 ?  ?POSTURE: ?Rounded shoulders ?  ?UPPER EXTREMITY AROM/PROM: passive only ?  ?PROM Right ?08/12/2021 Rt ?08/18/2021 Right ?08/24/21 Right ?08/31/21  ?Shoulder flexion 121 140  146 153  ?Shoulder extension        ?Shoulder abduction 97 125  140 161  ?Shoulder adduction        ?Shoulder internal rotation 86 (45 deg abduction)    90 (70 deg abduction)  ?Shoulder external rotation 48 (45 deg abduction) 65 (45 deg abduction)  64 (45 deg abduction)  60 (70 deg abduction)  ?(Blank rows = not tested) ? ?AROM Right ?09/08/21  ?Shoulder flexion 135 sitting  ?Shoulder extension   ?Shoulder abduction 135 sitting  ?Shoulder adduction   ?Shoulder internal rotation Rt iliac crest  ?Shoulder external rotation 80 sitting  ? ?  ?UPPER EXTREMITY MMT: deferred today due to post op status ?  ?MMT Right ?08/12/2021 Left ?08/12/2021  ?Shoulder flexion      ?Shoulder extension      ?Shoulder abduction      ?Shoulder adduction      ?Shoulder internal rotation      ?Shoulder external rotation      ?Middle trapezius      ?Lower trapezius      ?Elbow flexion      ?Elbow extension      ?Wrist flexion      ?Wrist extension      ?Wrist ulnar  deviation      ?Wrist radial deviation      ?Wrist pronation      ?Wrist supination      ?Grip strength (lbs)      ?(Blank rows = not tested) ?  ?  ?PALPATION:  ?deferred ?           ?TODAY'S TREATMENT:  ?09/15/21 ?Therex: ?UBE L3 x 8 min (alt 2' each direction) ?Wall ladder flexion x 5 reps; 10 sec hold at end range ?Wall ladder scaption x 5 reps; 10 sec hold at end range ?IR stretch 10 x  10 sec hold with strap ?Rt shoulder flexion to 90 deg 2x10; 2# ?Rt shoulder scaption to 90 deg 2x10; 2# ?Rt bicep curl 2x10; 2# ?Cable rows 15# 2x10 ?Cable IR on Rt 10# 2x10 ?Cable ER on Rt 5# 2x10 ?Tricep press on cable 20# 2x10; bil ?Rt cross body stretch 3x30 sec hold ?Rt bent over row 2x10; 5# ?Rt bent over shoulder extension 2x10; 5# ? ? ?09/10/21 ?Therex: ?UBE L3 x 8 min (alt 2' each direction) ?Wall ladder flexion x 5 reps; 10 sec hold at end range ?Wall ladder scaption x 5 reps; 10 sec hold at end range ?AA Rt shoulder flexion with 2# bar x 10 reps; 5 sec hold ?AA Rt shoulder abduction with 2# bar x 10 reps; 5 sec hold ?Rt shoulder flexion to 90 deg 2x10; 2# ?Rt shoulder abduction to 90 deg 2x10; 2# ?Rt elbow flexion 2x10; 2# ?Rows L4 3x10 ?Rt shoulder IR L4 3x10 ?Rt shoulder ER L4 2x10 - min cues for technique ?Bil shoulder ext 2x10 L4 band ? ? ?09/08/21 ? Therex: ?AA Rt shoulder abduction with 1# bar ?AA Rt shoulder flexion with 1# bar ?Demonstrated internal rotation stretch ?Rt shoulder flexion to 90 deg 2x10; 2# ?Rt shoulder abduction to 90 deg 2x10; 2# ?Rt elbow flexion 2x10; 2# ?Rows L4 2x10 ?Shoulder ext L4 2x10 ?Rt shoulder IR L4 2x10 ?Rt shoulder ER L3 2x10 - min cues for technique ?UBE L3 x 4 min (2' each direction) ? ? ?PATIENT EDUCATION: ?Education details: HEP ?Person educated: Patient ?Education method: Explanation, Demonstration, and Handouts ?Education comprehension: verbalized understanding, returned demonstration, and needs further education ?  ?  ?HOME EXERCISE PROGRAM: ?Access Code: [email or web address] ?URL:  [email or web address] ?Date: 09/08/2021 ?Prepared by: Faustino Congress ? ?Exercises ?- Seated Scapular Retraction  - 2 x daily - 7 x weekly - 1 sets - 10 reps - 5 sec hold ?- Standing Shoulder Abduction R

## 2021-09-17 ENCOUNTER — Other Ambulatory Visit (HOSPITAL_COMMUNITY): Payer: Self-pay

## 2021-09-17 ENCOUNTER — Encounter: Payer: Self-pay | Admitting: Physical Therapy

## 2021-09-17 ENCOUNTER — Ambulatory Visit: Payer: PPO | Admitting: Physical Therapy

## 2021-09-17 DIAGNOSIS — M25511 Pain in right shoulder: Secondary | ICD-10-CM | POA: Diagnosis not present

## 2021-09-17 DIAGNOSIS — R293 Abnormal posture: Secondary | ICD-10-CM | POA: Diagnosis not present

## 2021-09-17 DIAGNOSIS — M6281 Muscle weakness (generalized): Secondary | ICD-10-CM

## 2021-09-17 DIAGNOSIS — M25611 Stiffness of right shoulder, not elsewhere classified: Secondary | ICD-10-CM | POA: Diagnosis not present

## 2021-09-17 DIAGNOSIS — R600 Localized edema: Secondary | ICD-10-CM

## 2021-09-17 MED ORDER — DICLOFENAC POTASSIUM 50 MG PO TABS
50.0000 mg | ORAL_TABLET | Freq: Two times a day (BID) | ORAL | 3 refills | Status: DC | PRN
  Filled 2021-09-17: qty 60, 30d supply, fill #0

## 2021-09-17 NOTE — Therapy (Signed)
?OUTPATIENT PHYSICAL THERAPY TREATMENT NOTE ? ? ?Patient Name: James Gallegos ?MRN: [id number] ?DOB:[birth year], 71 y.o., male ?Today's Date: 09/17/2021 ? ?PCP: Eunice Blase, MD ?REFERRING PROVIDER: Persons, Bevely Palmer, [place] ? ? PT End of Session - 09/17/21 1523   ? ? Visit Number 8   ? Number of Visits 16   ? Date for PT Re-Evaluation 10/07/21   ? Authorization Type HTA $30 copay   ? Progress Note Due on Visit 10   ? PT Start Time 2620   ? PT Stop Time 3559   ? PT Time Calculation (min) 38 min   ? Activity Tolerance Patient tolerated treatment well   ? Behavior During Therapy [facility] for tasks assessed/performed   ? ?  ?  ? ?  ? ? ? ? ? ? ? ? ?Past Medical History:  ?Diagnosis Date  ? Arthritis   ? back , R foot -  ? Deep vein thrombosis ([place])   ? after knee surgery in 2004  ? GERD (gastroesophageal reflux disease)   ? diet controlled no meds per pt  ? Hypertension   ? Murmur, cardiac   ? pt. reports its difficult to auscultate   ? 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 little finger and ring finger numb on side by little finger numbness/tingling /pain all the time per pt  ? Pneumonia   ? back in college days.   ? Pre-diabetes   ? Sleep apnea   ? CPAP- last study- 2014 cpap set on 13 per pt  ? Wears glasses 11/20/2020  ? for reading  ? ?Past Surgical History:  ?Procedure Laterality Date  ? ANTERIOR CERVICAL DECOMP/DISCECTOMY FUSION N/A 04/14/2020  ? Procedure: ANTERIOR CERVICAL DECOMPRESSION/DISCECTOMY FUSION, INTERBODY PROSTHESIS, PLATE/SCREWS CERVICAL THREE-FOUR,CERVICAL FOUR-FIVE,CERVICAL SIX-SEVEN;  Surgeon: Newman Pies, MD;  Location: [place];  Service: Neurosurgery;  Laterality: N/A;  anterior  ? [place] N/A 1990  ? C5-6 discectomy plates and screws in neck per pt  ? colonscopy  2017  ? polyps removed per pt follow up in 5 years  ? FINGER SURGERY Right 2008  ? x 3 index finger  ? KNEE ARTHROSCOPY Bilateral   ? L 2004, R 2005  ? MASS EXCISION Right 01/18/2017  ? Procedure: EXCISION  BENIGN RIGHT NECK MASS;  Surgeon: Rozetta Nunnery, MD;  Location: [place];  Service: ENT;  Laterality: Right;  ? MASS EXCISION Bilateral 03/23/2019  ? Procedure: EXCISIONS OF RIGHT CHEEK LESION, RIGHT POSTERIOR NECK LIPOMA  AND LEFT NECK NODULE;  Surgeon: Rozetta Nunnery, MD;  Location: [place];  Service: ENT;  Laterality: Bilateral;  ? NASAL FRACTURE SURGERY  1980  ? TOTAL KNEE ARTHROPLASTY Right 06/01/2016  ? Procedure: TOTAL KNEE ARTHROPLASTY;  Surgeon: [name], MD;  Location: [place];  Service: Orthopedics;  Laterality: Right;  ? TOTAL KNEE ARTHROPLASTY Left 07/25/2018  ? Procedure: LEFT TOTAL KNEE ARTHROPLASTY;  Surgeon: [name], MD;  Location: [place];  Service: Orthopedics;  Laterality: Left;  ? ULNAR NERVE TRANSPOSITION Left 11/25/2020  ? Procedure: ulnar nerve decompression left elbow;  Surgeon: [name], MD;  Location: [place];  Service: Orthopedics;  Laterality: Left;  ? ?Patient Active Problem List  ? Diagnosis Date Noted  ? Pain in left hip 09/03/2021  ? 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 Cervical spondylosis with myelopathy and radiculopathy 04/14/2020  ? Impingement syndrome of right shoulder 10/10/2019  ? Low back pain 10/10/2019  ?  Irritation of ulnar nerve, left 08/28/2019  ? Class 2 obesity due to excess calories without serious comorbidity with body mass index (BMI) of 39.0 to 39.9 in adult 01/08/2019  ? Pain in right elbow 12/06/2018  ? Primary osteoarthritis of left knee 07/25/2018  ? History of left knee replacement 07/25/2018  ? Right foot pain 06/22/2018  ? Acute left-sided low back pain with left-sided sciatica 06/22/2018  ? Osteoarthritis 11/21/2017  ? Hyperlipidemia 11/21/2017  ? Low testosterone 09/26/2016  ? Healthcare maintenance 09/26/2016  ? S/P total knee replacement using cement, right 06/01/2016  ? Sleep apnea 05/19/2016  ? Hypertension 05/19/2016  ? ? ?REFERRING DIAG: M75.41 (ICD-10-CM) - Impingement  syndrome of right shoulder  ? ?THERAPY DIAG:  ?Acute pain of right shoulder ? ?Stiffness of right shoulder, not elsewhere classified ? ?Abnormal posture ? ?Localized edema ? ?Muscle weakness (generalized) ? ?PERTINENT HISTORY: HTN, Lt knee OA, obesity, ACDF ? ?PRECAUTIONS: okay to progress AROM/strengthening ? ?SUBJECTIVE: doing well today, no pain in shoulder even reaching behind ? ?PAIN:  ?Are you having pain? No ?NPRS scale: 0/10 ?Pain location: shoulder ?Pain orientation: Right  ?PAIN TYPE: acute ?Pain description:  sore   ?Aggravating factors: increased activity (working at computer), occasionally worse at night ?Relieving factors: meds, rest ? ? ? ? ?OBJECTIVE:  ?  ?NEXT MD: 10/07/21 ?  ?PATIENT SURVEYS:  ?08/12/21: FOTO 41 (predicted 64) ?08/31/21 FOTO 55 ?  ?POSTURE: ?Rounded shoulders ?  ?UPPER EXTREMITY AROM/PROM: passive only ?  ?PROM Right ?08/12/2021 Rt ?08/18/2021 Right ?08/24/21 Right ?08/31/21  ?Shoulder flexion 121 140  146 153  ?Shoulder extension        ?Shoulder abduction 97 125  140 161  ?Shoulder adduction        ?Shoulder internal rotation 86 (45 deg abduction)    90 (70 deg abduction)  ?Shoulder external rotation 48 (45 deg abduction) 65 (45 deg abduction)  64 (45 deg abduction)  60 (70 deg abduction)  ?(Blank rows = not tested) ? ?AROM Right ?09/08/21  ?Shoulder flexion 135 sitting  ?Shoulder extension   ?Shoulder abduction 135 sitting  ?Shoulder adduction   ?Shoulder internal rotation Rt iliac crest  ?Shoulder external rotation 80 sitting  ? ?  ?UPPER EXTREMITY MMT: deferred today due to post op status ?  ?MMT Right ?08/12/2021 Left ?08/12/2021  ?Shoulder flexion      ?Shoulder extension      ?Shoulder abduction      ?Shoulder adduction      ?Shoulder internal rotation      ?Shoulder external rotation      ?Middle trapezius      ?Lower trapezius      ?Elbow flexion      ?Elbow extension      ?Wrist flexion      ?Wrist extension      ?Wrist ulnar deviation      ?Wrist radial deviation      ?Wrist pronation       ?Wrist supination      ?Grip strength (lbs)      ?(Blank rows = not tested) ?  ?  ?PALPATION:  ?deferred ?           ?TODAY'S TREATMENT:  ?09/17/21 ?Therex: ?UBE L3.5 x 8 min (alt 2' each direction) ?Body Blade Flexion 3x30 sec; Abduction 3x30 sec ?90 deg turns with stationary arm flex/abdct holding 2# weight 2x10 ?Cable rows 15# 3x10 ?Rt cross body stretch 3x30 sec hold ?Wall ladder flexion x 5 reps; 10 sec hold  at end range ?Wall ladder scaption x 5 reps; 10 sec hold at end range ?Rt bicep curl 3# 3x10 ?Bent over tricep kickback 3# 3x10 ?IR stretch 10 x 10 sec hold with strap ?Flexion to 90; horizontal abduction; adduction then reverse 2#; 2x10 ? ? ?09/15/21 ?Therex: ?UBE L3 x 8 min (alt 2' each direction) ?Wall ladder flexion x 5 reps; 10 sec hold at end range ?Wall ladder scaption x 5 reps; 10 sec hold at end range ?IR stretch 10 x 10 sec hold with strap ?Rt shoulder flexion to 90 deg 2x10; 2# ?Rt shoulder scaption to 90 deg 2x10; 2# ?Rt bicep curl 2x10; 2# ?Cable rows 15# 2x10 ?Cable IR on Rt 10# 2x10 ?Cable ER on Rt 5# 2x10 ?Tricep press on cable 20# 2x10; bil ?Rt cross body stretch 3x30 sec hold ?Rt bent over row 2x10; 5# ?Rt bent over shoulder extension 2x10; 5# ? ? ?09/10/21 ?Therex: ?UBE L3 x 8 min (alt 2' each direction) ?Wall ladder flexion x 5 reps; 10 sec hold at end range ?Wall ladder scaption x 5 reps; 10 sec hold at end range ?AA Rt shoulder flexion with 2# bar x 10 reps; 5 sec hold ?AA Rt shoulder abduction with 2# bar x 10 reps; 5 sec hold ?Rt shoulder flexion to 90 deg 2x10; 2# ?Rt shoulder abduction to 90 deg 2x10; 2# ?Rt elbow flexion 2x10; 2# ?Rows L4 3x10 ?Rt shoulder IR L4 3x10 ?Rt shoulder ER L4 2x10 - min cues for technique ?Bil shoulder ext 2x10 L4 band ? ? ?PATIENT EDUCATION: ?Education details: HEP ?Person educated: Patient ?Education method: Explanation, Demonstration, and Handouts ?Education comprehension: verbalized understanding, returned demonstration, and needs further education ?   ?  ?HOME EXERCISE PROGRAM: ?Access Code: [place] ?URL: [email or web address] ?Date: 09/08/2021 ?Prepared by: Faustino Congress ? ?Exercises ?- Seated Scapular Retraction  - 2 x daily - 7

## 2021-09-21 ENCOUNTER — Ambulatory Visit (INDEPENDENT_AMBULATORY_CARE_PROVIDER_SITE_OTHER): Payer: PPO | Admitting: Physical Therapy

## 2021-09-21 ENCOUNTER — Encounter: Payer: Self-pay | Admitting: Physical Therapy

## 2021-09-21 DIAGNOSIS — M25511 Pain in right shoulder: Secondary | ICD-10-CM | POA: Diagnosis not present

## 2021-09-21 DIAGNOSIS — R293 Abnormal posture: Secondary | ICD-10-CM

## 2021-09-21 DIAGNOSIS — M6281 Muscle weakness (generalized): Secondary | ICD-10-CM

## 2021-09-21 DIAGNOSIS — R600 Localized edema: Secondary | ICD-10-CM | POA: Diagnosis not present

## 2021-09-21 DIAGNOSIS — M25611 Stiffness of right shoulder, not elsewhere classified: Secondary | ICD-10-CM | POA: Diagnosis not present

## 2021-09-21 NOTE — Therapy (Signed)
?OUTPATIENT PHYSICAL THERAPY TREATMENT NOTE ? ? ?Patient Name: James Gallegos ?MRN: [id number] ?DOB:September 17, 1950, 71 y.o., male ?Today's Date: 09/21/2021 ? ?PCP: Eunice Blase, MD ?REFERRING PROVIDER: Eunice Blase, MD ? ? PT End of Session - 09/21/21 0843   ? ? Visit Number 9   ? Number of Visits 16   ? Date for PT Re-Evaluation 10/07/21   ? Authorization Type HTA $30 copay   ? Progress Note Due on Visit 10   ? PT Start Time [phone]   ? PT Stop Time [phone]   ? PT Time Calculation (min) 40 min   ? Activity Tolerance Patient tolerated treatment well   ? Behavior During Therapy [facility] for tasks assessed/performed   ? ?  ?  ? ?  ? ? ? ? ? ? ? ? ? ?Past Medical History:  ?Diagnosis Date  ? Arthritis   ? back , R foot -  ? Deep vein thrombosis ([place])   ? after knee surgery in 2004  ? GERD (gastroesophageal reflux disease)   ? diet controlled no meds per pt  ? Hypertension   ? Murmur, cardiac   ? pt. reports its difficult to auscultate   ? 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 little finger and ring finger numb on side by little finger numbness/tingling /pain all the time per pt  ? Pneumonia   ? back in college days.   ? Pre-diabetes   ? Sleep apnea   ? CPAP- last study- 2014 cpap set on 13 per pt  ? Wears glasses 11/20/2020  ? for reading  ? ?Past Surgical History:  ?Procedure Laterality Date  ? ANTERIOR CERVICAL DECOMP/DISCECTOMY FUSION N/A 04/14/2020  ? Procedure: ANTERIOR CERVICAL DECOMPRESSION/DISCECTOMY FUSION, INTERBODY PROSTHESIS, PLATE/SCREWS CERVICAL THREE-FOUR,CERVICAL FOUR-FIVE,CERVICAL SIX-SEVEN;  Surgeon: Newman Pies, MD;  Location: [place];  Service: Neurosurgery;  Laterality: N/A;  anterior  ? [place] N/A 1990  ? C5-6 discectomy plates and screws in neck per pt  ? colonscopy  2017  ? polyps removed per pt follow up in 5 years  ? FINGER SURGERY Right 2008  ? x 3 index finger  ? KNEE ARTHROSCOPY Bilateral   ? L 2004, R 2005  ? MASS EXCISION Right 01/18/2017  ? Procedure: EXCISION  BENIGN RIGHT NECK MASS;  Surgeon: Rozetta Nunnery, MD;  Location: [place];  Service: ENT;  Laterality: Right;  ? MASS EXCISION Bilateral 03/23/2019  ? Procedure: EXCISIONS OF RIGHT CHEEK LESION, RIGHT POSTERIOR NECK LIPOMA  AND LEFT NECK NODULE;  Surgeon: Rozetta Nunnery, MD;  Location: [place];  Service: ENT;  Laterality: Bilateral;  ? NASAL FRACTURE SURGERY  1980  ? TOTAL KNEE ARTHROPLASTY Right 06/01/2016  ? Procedure: TOTAL KNEE ARTHROPLASTY;  Surgeon: [name], MD;  Location: [place];  Service: Orthopedics;  Laterality: Right;  ? TOTAL KNEE ARTHROPLASTY Left 07/25/2018  ? Procedure: LEFT TOTAL KNEE ARTHROPLASTY;  Surgeon: [name], MD;  Location: [place];  Service: Orthopedics;  Laterality: Left;  ? ULNAR NERVE TRANSPOSITION Left 11/25/2020  ? Procedure: ulnar nerve decompression left elbow;  Surgeon: [name], MD;  Location: [place];  Service: Orthopedics;  Laterality: Left;  ? ?Patient Active Problem List  ? Diagnosis Date Noted  ? Pain in left hip 09/03/2021  ? 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 Cervical spondylosis with myelopathy and radiculopathy 04/14/2020  ? Impingement syndrome of right shoulder 10/10/2019  ? Low back pain 10/10/2019  ?  Irritation of ulnar nerve, left 08/28/2019  ? Class 2 obesity due to excess calories without serious comorbidity with body mass index (BMI) of 39.0 to 39.9 in adult 01/08/2019  ? Pain in right elbow 12/06/2018  ? Primary osteoarthritis of left knee 07/25/2018  ? History of left knee replacement 07/25/2018  ? Right foot pain 06/22/2018  ? Acute left-sided low back pain with left-sided sciatica 06/22/2018  ? Osteoarthritis 11/21/2017  ? Hyperlipidemia 11/21/2017  ? Low testosterone 09/26/2016  ? Healthcare maintenance 09/26/2016  ? S/P total knee replacement using cement, right 06/01/2016  ? Sleep apnea 05/19/2016  ? Hypertension 05/19/2016  ? ? ?REFERRING DIAG: M75.41 (ICD-10-CM) - Impingement  syndrome of right shoulder  ? ?THERAPY DIAG:  ?Acute pain of right shoulder ? ?Stiffness of right shoulder, not elsewhere classified ? ?Abnormal posture ? ?Localized edema ? ?Muscle weakness (generalized) ? ?PERTINENT HISTORY: HTN, Lt knee OA, obesity, ACDF ? ?PRECAUTIONS: okay to progress AROM/strengthening ? ?SUBJECTIVE: Both shoulders are sore today; Lt hip is more uncomfortable  ? ?PAIN:  ?Are you having pain? No ?NPRS scale: 0/10 ?Pain location: shoulder ?Pain orientation: Right  ?PAIN TYPE: acute ?Pain description:  sore   ?Aggravating factors: increased activity (working at computer), occasionally worse at night ?Relieving factors: meds, rest ? ? ? ? ?OBJECTIVE:  ?  ?NEXT MD: 10/07/21 ?  ?PATIENT SURVEYS:  ?08/12/21: FOTO 41 (predicted 64) ?08/31/21 FOTO 55 ?  ?POSTURE: ?Rounded shoulders ?  ?UPPER EXTREMITY AROM/PROM: passive only ?  ?PROM Right ?08/12/2021 Rt ?08/18/2021 Right ?08/24/21 Right ?08/31/21  ?Shoulder flexion 121 140  146 153  ?Shoulder extension        ?Shoulder abduction 97 125  140 161  ?Shoulder adduction        ?Shoulder internal rotation 86 (45 deg abduction)    90 (70 deg abduction)  ?Shoulder external rotation 48 (45 deg abduction) 65 (45 deg abduction)  64 (45 deg abduction)  60 (70 deg abduction)  ?(Blank rows = not tested) ? ?AROM Right ?09/08/21  ?Shoulder flexion 135 sitting  ?Shoulder extension   ?Shoulder abduction 135 sitting  ?Shoulder adduction   ?Shoulder internal rotation Rt iliac crest  ?Shoulder external rotation 80 sitting  ? ?  ?UPPER EXTREMITY MMT: deferred today due to post op status ?  ?MMT Right ?08/12/2021 Left ?08/12/2021  ?Shoulder flexion      ?Shoulder extension      ?Shoulder abduction      ?Shoulder adduction      ?Shoulder internal rotation      ?Shoulder external rotation      ?Middle trapezius      ?Lower trapezius      ?Elbow flexion      ?Elbow extension      ?Wrist flexion      ?Wrist extension      ?Wrist ulnar deviation      ?Wrist radial deviation      ?Wrist  pronation      ?Wrist supination      ?Grip strength (lbs)      ?(Blank rows = not tested) ?  ?  ?PALPATION:  ?deferred ?           ?TODAY'S TREATMENT:  ?09/21/21 ?Therex: ?UBE L3.5 x 8 min (alt 2' each direction). ?Cable rows 15# 3x10 ?IR reactive isometric 10# on cable 2x10 ?ER reactive isometric 5# on cable 2x10 ?Rt bicep curls 3# 3x10 ?Flexion to 90; horizontal abduction; adduction then reverse 2#; 2x10 ?IR stretch (behind back with  strap) 5x10 sec hold  ?Wall ladder flexion x 5 reps; 10 sec hold at end range ?Wall ladder scaption x 5 reps; 10 sec hold at end range ?Ball on wall (2# ball) flexion circles CW/CCW x10 then abduction circles CW/CCW x10 ?Serratus push in supine Rt 2x10; 8# ?Rt sidelying ER 2x10; 3# ? ? ?09/17/21 ?Therex: ?UBE L3.5 x 8 min (alt 2' each direction) ?Body Blade Flexion 3x30 sec; Abduction 3x30 sec ?90 deg turns with stationary arm flex/abdct holding 2# weight 2x10 ?Cable rows 15# 3x10 ?Rt cross body stretch 3x30 sec hold ?Wall ladder flexion x 5 reps; 10 sec hold at end range ?Wall ladder scaption x 5 reps; 10 sec hold at end range ?Rt bicep curl 3# 3x10 ?Bent over tricep kickback 3# 3x10 ?IR stretch 10 x 10 sec hold with strap ?Flexion to 90; horizontal abduction; adduction then reverse 2#; 2x10 ? ? ?09/15/21 ?Therex: ?UBE L3 x 8 min (alt 2' each direction) ?Wall ladder flexion x 5 reps; 10 sec hold at end range ?Wall ladder scaption x 5 reps; 10 sec hold at end range ?IR stretch 10 x 10 sec hold with strap ?Rt shoulder flexion to 90 deg 2x10; 2# ?Rt shoulder scaption to 90 deg 2x10; 2# ?Rt bicep curl 2x10; 2# ?Cable rows 15# 2x10 ?Cable IR on Rt 10# 2x10 ?Cable ER on Rt 5# 2x10 ?Tricep press on cable 20# 2x10; bil ?Rt cross body stretch 3x30 sec hold ?Rt bent over row 2x10; 5# ?Rt bent over shoulder extension 2x10; 5# ? ? ? ?PATIENT EDUCATION: ?Education details: HEP ?Person educated: Patient ?Education method: Explanation, Demonstration, and Handouts ?Education comprehension: verbalized  understanding, returned demonstration, and needs further education ?  ?  ?HOME EXERCISE PROGRAM: ?Access Code: [place] ?URL: [email or web address] ?Date: 09/08/2021 ?Prepared by: Faustino Congress ? ?

## 2021-09-22 ENCOUNTER — Encounter: Payer: PPO | Admitting: Physical Therapy

## 2021-09-24 ENCOUNTER — Encounter: Payer: PPO | Admitting: Physical Therapy

## 2021-09-27 IMAGING — CR DG CERVICAL SPINE 2 OR 3 VIEWS
3 series · 3 of 3 positions shown · non-contrast
Comparison: MRI cervical spine dated 07/24/2019

CLINICAL DATA: Intraop C3-4, C4-5, and C6-7 ACDF

EXAM:
CERVICAL SPINE - 2-3 VIEW

[xtable (1 of 3)]
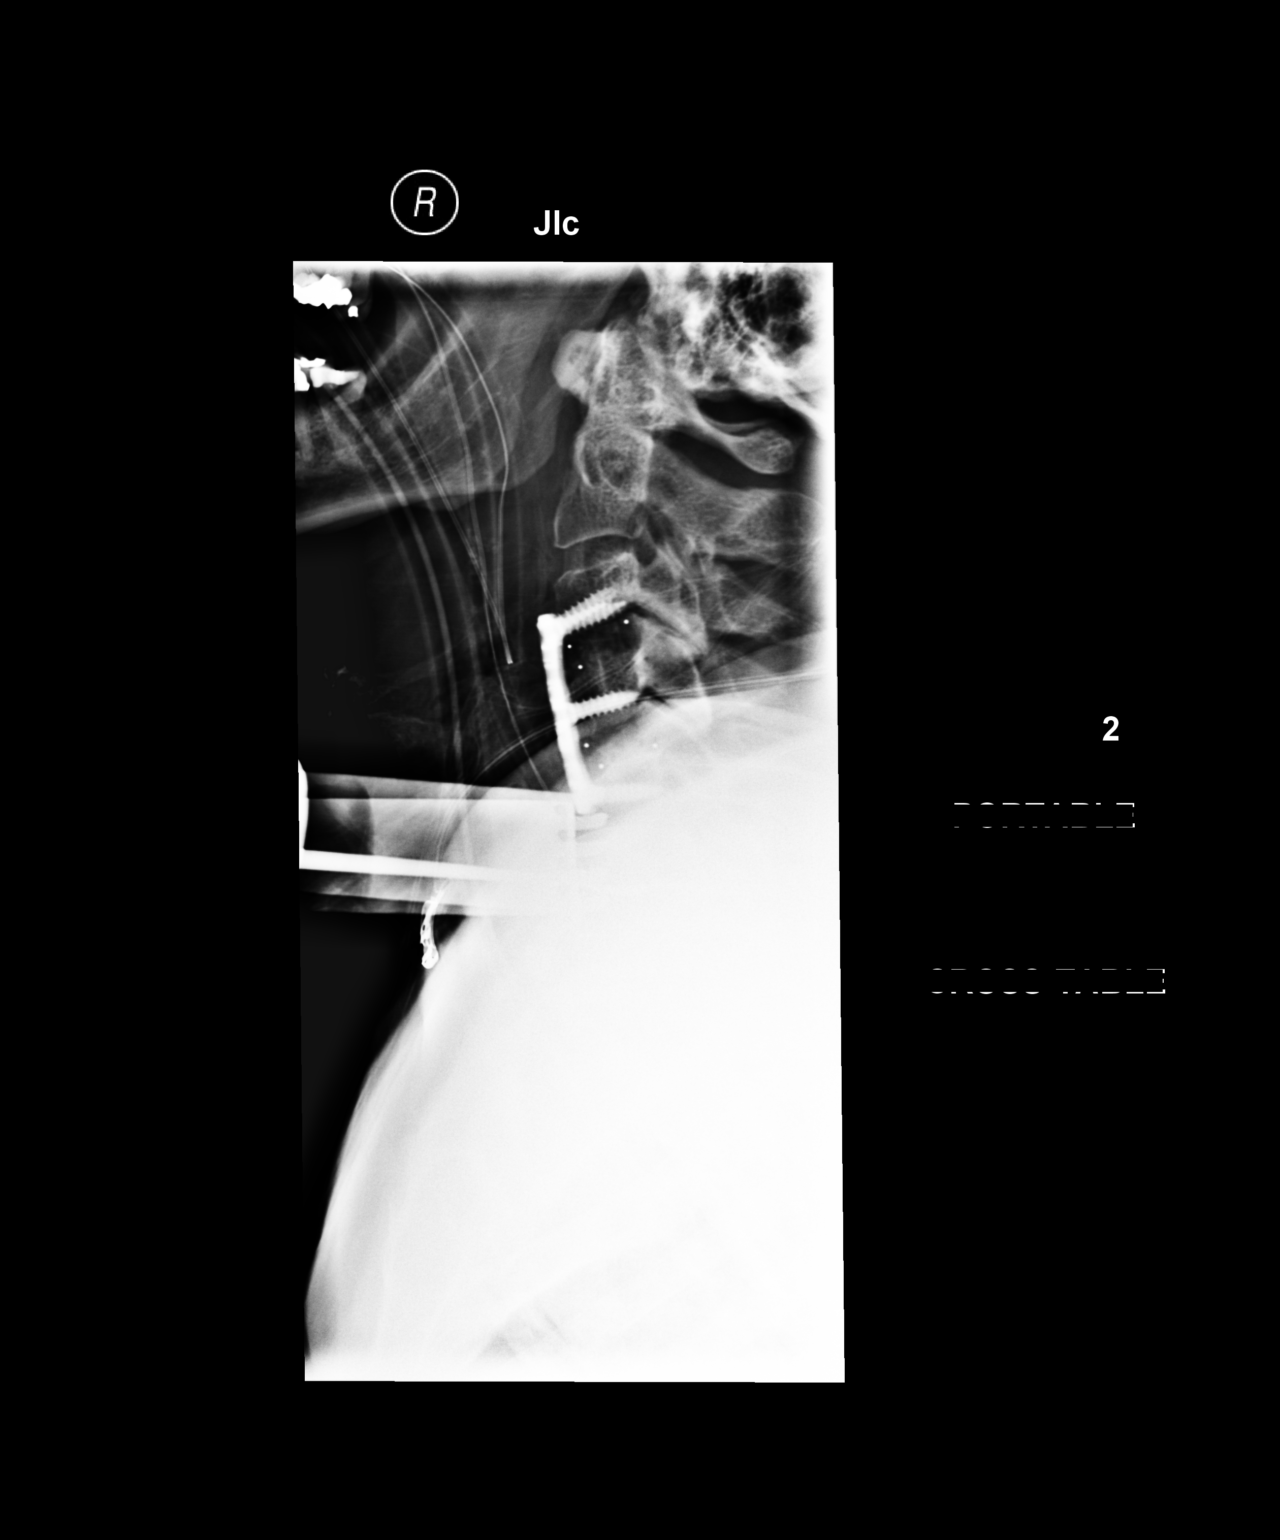

[xtable (2 of 3)]
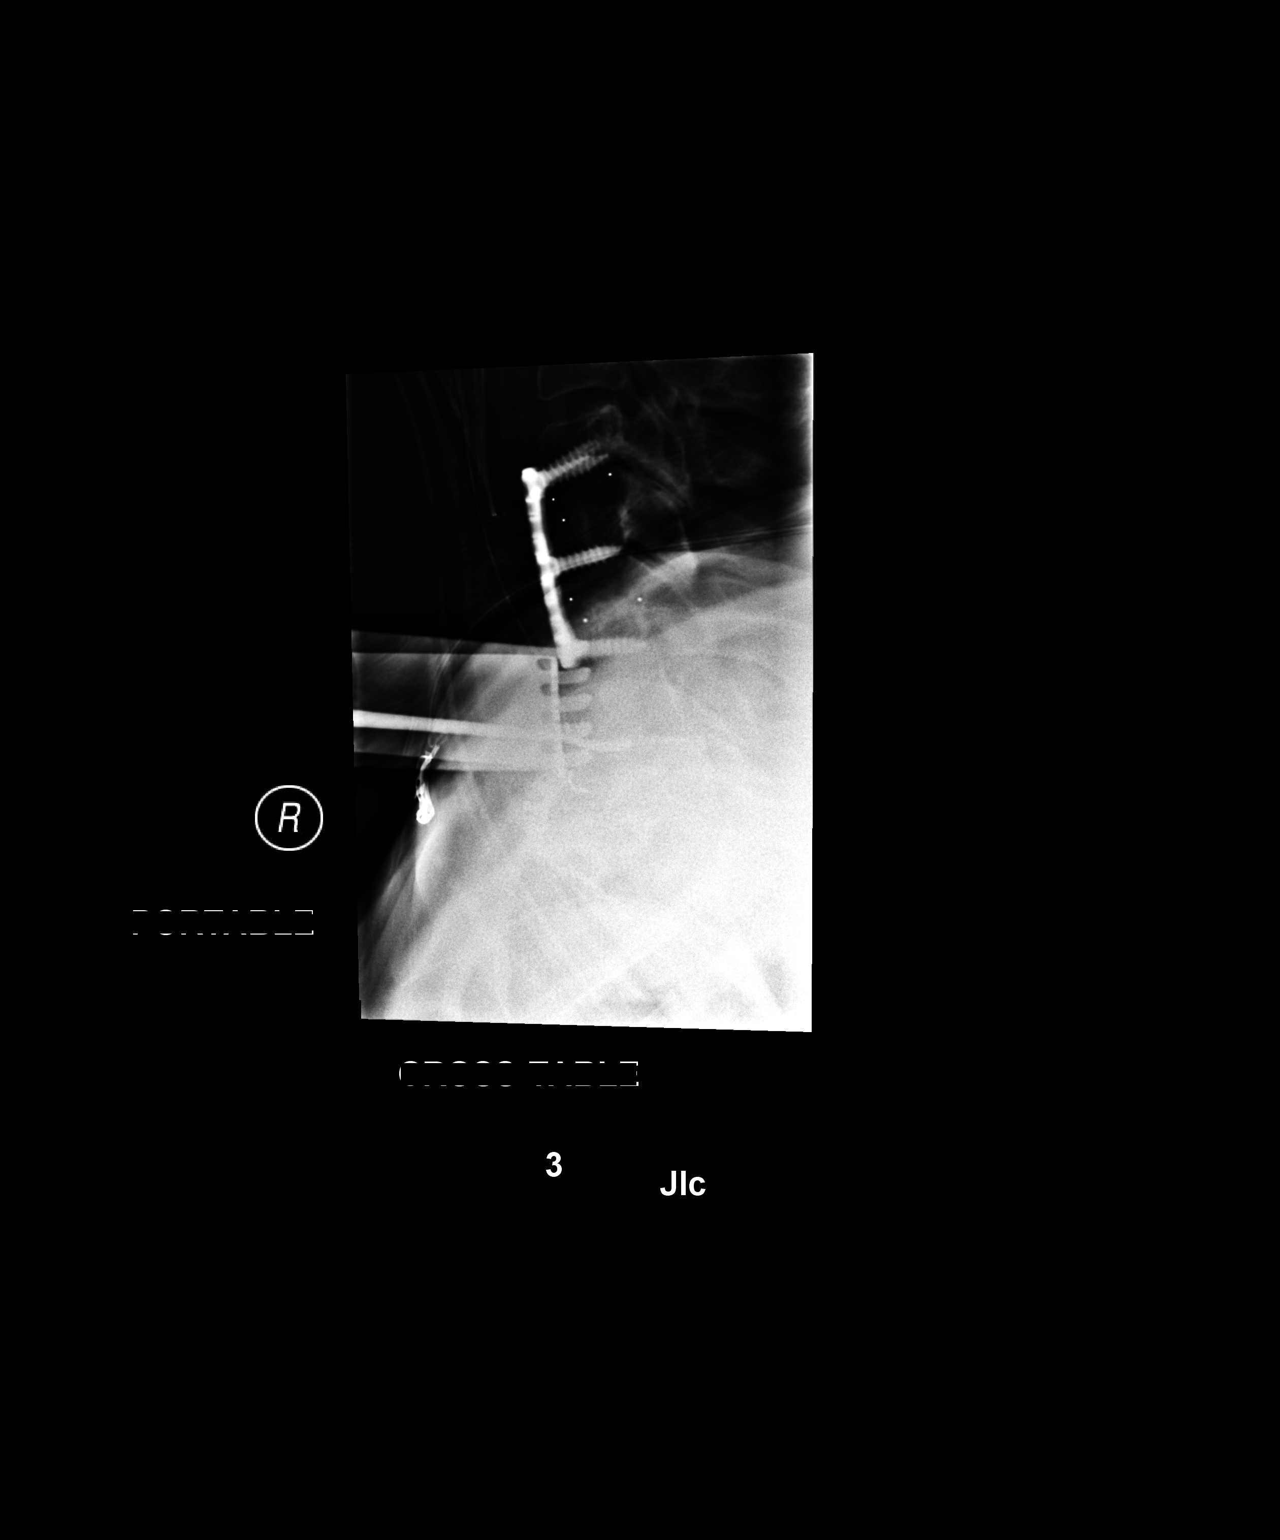

[xtable (3 of 3)]
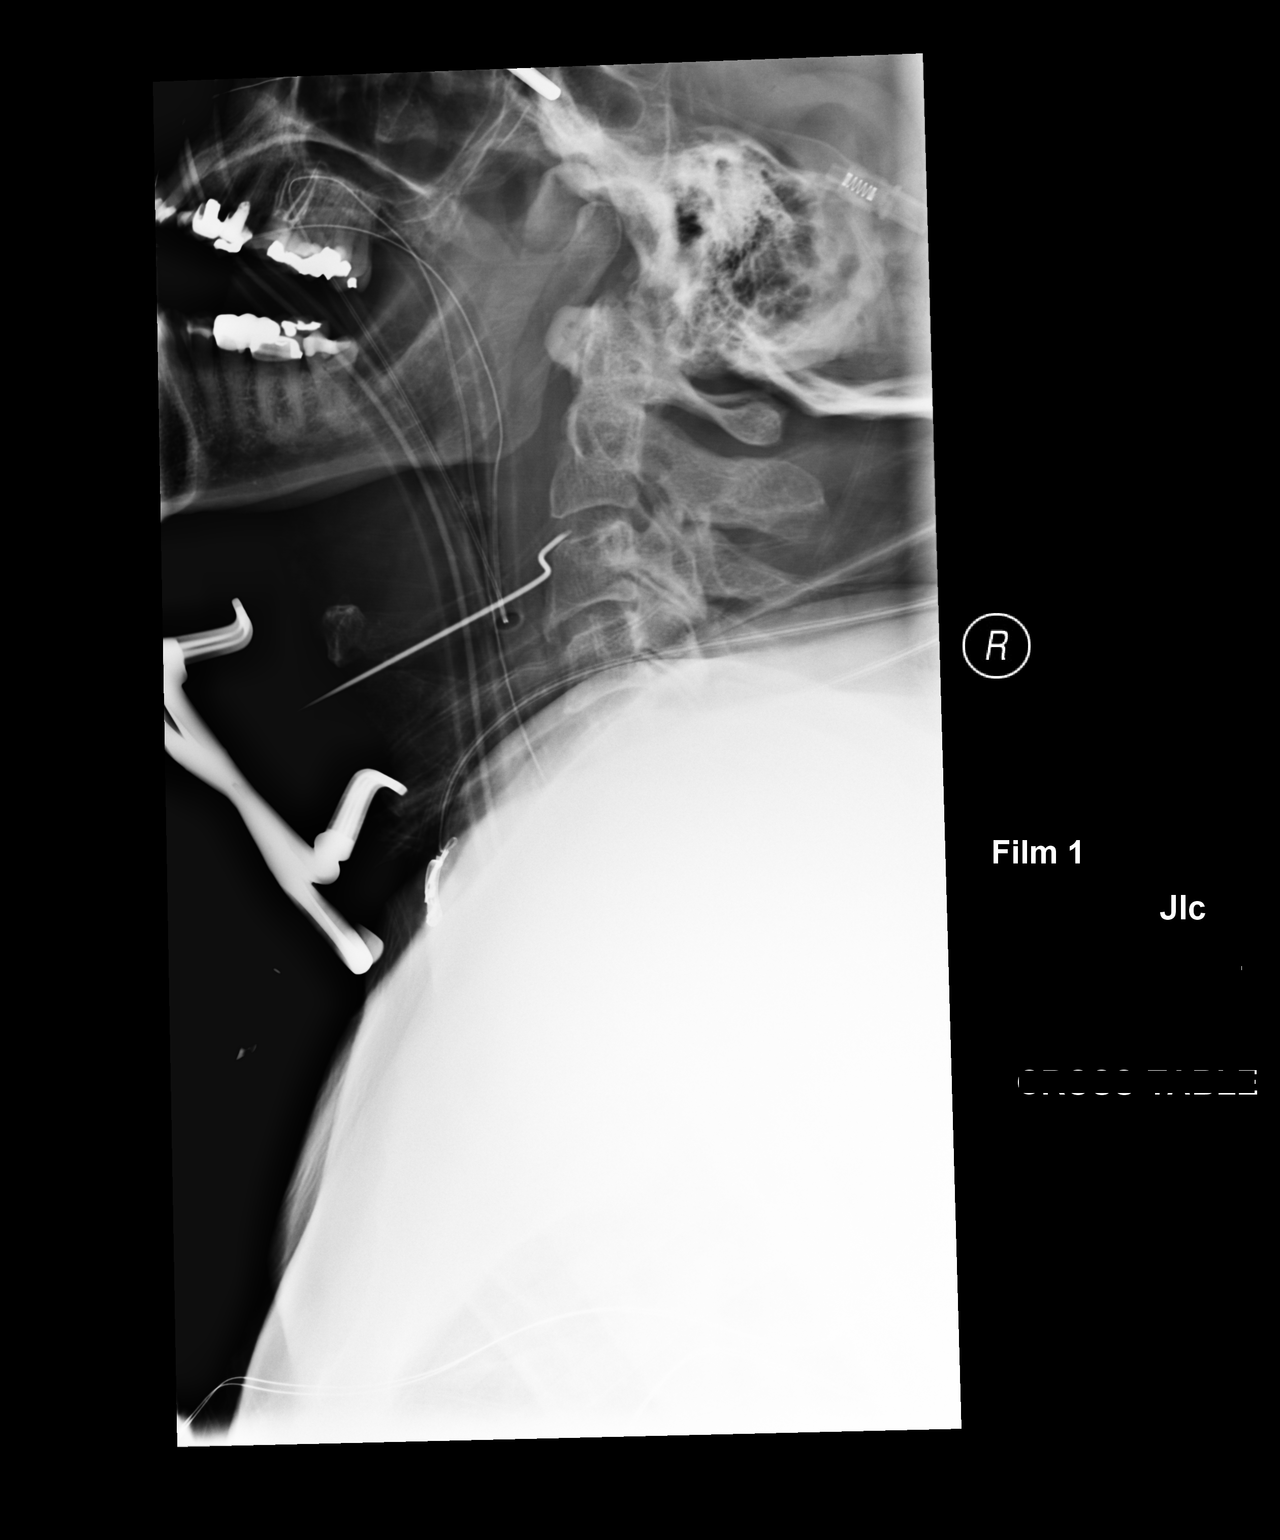

[3 of 3 positions shown; findings below may reference images not displayed]

FINDINGS: Initial cross-table radiograph demonstrates a surgical probe at
C2-3.

Subsequent surgical radiographs demonstrates C3-5 ACDF surgical
hardware and surgical hardware at the C6 and C6-7 levels.
IMPRESSION: Intraoperative localization as above.

## 2021-09-28 ENCOUNTER — Encounter: Payer: Self-pay | Admitting: Physical Therapy

## 2021-09-28 ENCOUNTER — Ambulatory Visit (INDEPENDENT_AMBULATORY_CARE_PROVIDER_SITE_OTHER): Payer: PPO | Admitting: Physical Therapy

## 2021-09-28 ENCOUNTER — Telehealth: Payer: Self-pay | Admitting: Physical Therapy

## 2021-09-28 DIAGNOSIS — R293 Abnormal posture: Secondary | ICD-10-CM

## 2021-09-28 DIAGNOSIS — M25611 Stiffness of right shoulder, not elsewhere classified: Secondary | ICD-10-CM

## 2021-09-28 DIAGNOSIS — M25511 Pain in right shoulder: Secondary | ICD-10-CM

## 2021-09-28 DIAGNOSIS — M6281 Muscle weakness (generalized): Secondary | ICD-10-CM

## 2021-09-28 DIAGNOSIS — R600 Localized edema: Secondary | ICD-10-CM | POA: Diagnosis not present

## 2021-09-28 NOTE — Telephone Encounter (Signed)
LVM for missed appt today.  Advised to call office if he has any questions.  Reminded of next scheduled appointment. ? ?Laureen Abrahams, PT, DPT ?09/28/21 8:20 AM ? ? ?

## 2021-09-28 NOTE — Therapy (Addendum)
OUTPATIENT PHYSICAL THERAPY TREATMENT NOTE/PROGRESS NOTE DISCHARGE SUMMARY  Progress Note Reporting Period 08/12/21 to 09/28/21  See note below for Objective Data and Assessment of Progress/Goals.        Patient Name: James Gallegos MRN: [id number] DOB:[birth year], 71 y.o., male Today's Date: 09/28/2021  PCP: Eunice Blase, MD REFERRING PROVIDER: Persons, Bevely Palmer, [place]   PT End of Session - 09/28/21 0827     Visit Number 10    Number of Visits 16    Date for PT Re-Evaluation 10/07/21    Authorization Type HTA $30 copay    Progress Note Due on Visit 10    PT Start Time 0821   pt arrived late   PT Stop Time 0845    PT Time Calculation (min) 24 min    Activity Tolerance Patient tolerated treatment well    Behavior During Therapy [facility] for tasks assessed/performed                     Past Medical History:  Diagnosis Date   Arthritis    back , R foot -   Deep vein thrombosis ([place])    after knee surgery in 2004   GERD (gastroesophageal reflux disease)    diet controlled no meds per pt   Hypertension    Murmur, cardiac    pt. 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    Pre-diabetes    Sleep apnea    CPAP- last study- 2014 cpap set on 13 per pt   Wears glasses 11/20/2020   for reading   Past Surgical History:  Procedure Laterality Date   ANTERIOR CERVICAL DECOMP/DISCECTOMY FUSION N/A 04/14/2020   Procedure: ANTERIOR CERVICAL DECOMPRESSION/DISCECTOMY FUSION, INTERBODY PROSTHESIS, PLATE/SCREWS CERVICAL THREE-FOUR,CERVICAL FOUR-FIVE,CERVICAL SIX-SEVEN;  Surgeon: Newman Pies, MD;  Location: [place];  Service: Neurosurgery;  Laterality: N/A;  anterior   CERVICAL SPINE SURGERY N/A 1990   C5-6 discectomy plates and screws in neck per pt   colonscopy  2017   polyps  removed per pt follow up in 5 years   FINGER SURGERY Right 2008   x 3 index finger   KNEE ARTHROSCOPY Bilateral    L 2004, R 2005   MASS EXCISION Right 01/18/2017   Procedure: EXCISION BENIGN RIGHT NECK MASS;  Surgeon: Rozetta Nunnery, MD;  Location: [place];  Service: ENT;  Laterality: Right;   MASS EXCISION Bilateral 03/23/2019   Procedure: EXCISIONS OF RIGHT CHEEK LESION, RIGHT POSTERIOR NECK LIPOMA  AND LEFT NECK NODULE;  Surgeon: Rozetta Nunnery, MD;  Location: [place];  Service: ENT;  Laterality: Bilateral;   [place] Right 06/01/2016   Procedure: TOTAL KNEE ARTHROPLASTY;  Surgeon: [name], MD;  Location: [place];  Service: Orthopedics;  Laterality: Right;   TOTAL KNEE ARTHROPLASTY Left 07/25/2018   Procedure: LEFT TOTAL KNEE ARTHROPLASTY;  Surgeon: [name], MD;  Location: [place];  Service: Orthopedics;  Laterality: Left;   ULNAR NERVE TRANSPOSITION Left 11/25/2020   Procedure: ulnar nerve decompression left elbow;  Surgeon: [name], MD;  Location: [place];  Service: Orthopedics;  Laterality: Left;   Patient Active Problem List   Diagnosis Date Noted   Pain in  left hip 09/03/2021   Neuropathy, 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   Cervical spondylosis with myelopathy and radiculopathy 04/14/2020   Impingement syndrome of right shoulder 10/10/2019   Low back pain 10/10/2019   Irritation of ulnar nerve, left 08/28/2019   Class 2 obesity due to excess calories without serious comorbidity with body mass index (BMI) of 39.0 to 39.9 in adult 01/08/2019   Pain in right elbow 12/06/2018   Primary osteoarthritis of left knee 07/25/2018   History of left knee replacement 07/25/2018   Right foot pain 06/22/2018   Acute left-sided low back pain with left-sided sciatica 06/22/2018   Osteoarthritis 11/21/2017   Hyperlipidemia 11/21/2017   Low testosterone 09/26/2016   Healthcare  maintenance 09/26/2016   S/P total knee replacement using cement, right 06/01/2016   Sleep apnea 05/19/2016   Hypertension 05/19/2016    REFERRING DIAG: M75.41 (ICD-10-CM) - Impingement syndrome of right shoulder   THERAPY DIAG:  Acute pain of right shoulder  Stiffness of right shoulder, not elsewhere classified  Abnormal posture  Localized edema  Muscle weakness (generalized)  PERTINENT HISTORY: HTN, Lt knee OA, obesity, ACDF  PRECAUTIONS: okay to progress AROM/strengthening  SUBJECTIVE: Lt hip is really painful today, Lt shoulder hurts from lifting luggage into overhead compartment on plane,  Rt shoulder is "okay"  PAIN:  Are you having pain? No NPRS scale: 0/10 Pain location: shoulder Pain orientation: Right  PAIN TYPE: acute Pain description:  sore   Aggravating factors: increased activity (working at computer), occasionally worse at night Relieving factors: meds, rest     OBJECTIVE:    NEXT MD: 10/07/21   PATIENT SURVEYS:  08/12/21: FOTO 41 (predicted 64) 08/31/21 FOTO 55 09/28/21 FOTO 71   POSTURE: Rounded shoulders   UPPER EXTREMITY AROM/PROM: passive only   PROM Right 08/12/2021 Rt 08/18/2021 Right 08/24/21 Right 08/31/21  Shoulder flexion 121 140  146 153  Shoulder extension        Shoulder abduction 97 125  140 161  Shoulder adduction        Shoulder internal rotation 86 (45 deg abduction)    90 (70 deg abduction)  Shoulder external rotation 48 (45 deg abduction) 65 (45 deg abduction)  64 (45 deg abduction)  60 (70 deg abduction)  (Blank rows = not tested)  AROM Right 09/08/21 Right 09/28/21  Shoulder flexion 135 sitting 163 sitting  Shoulder extension    Shoulder abduction 135 sitting 148 sitting  Shoulder adduction    Shoulder internal rotation Rt iliac crest Rt L3 sitting   Shoulder external rotation 80 sitting 83 sitting     UPPER EXTREMITY MMT: deferred today due to post op status   MMT Right 09/28/21  Shoulder flexion 3+/5   Shoulder  extension    Shoulder abduction 3+/5   Shoulder adduction    Shoulder internal rotation  5/5  Shoulder external rotation  4/5  (Blank rows = not tested)     PALPATION:  deferred            TODAY'S TREATMENT:  09/28/21 Therex: UBE L3.5 x 8 min (alt 2' each direction) See objective data for additional information  09/21/21 Therex: UBE L3.5 x 8 min (alt 2' each direction). Cable rows 15# 3x10 IR reactive isometric 10# on cable 2x10 ER reactive isometric 5# on cable 2x10 Rt bicep curls 3# 3x10 Flexion to 90; horizontal abduction; adduction then reverse 2#; 2x10 IR stretch (behind back with strap) 5x10 sec hold  Wall ladder flexion x 5 reps; 10 sec hold  at end range Wall ladder scaption x 5 reps; 10 sec hold at end range Ball on wall (2# ball) flexion circles CW/CCW x10 then abduction circles CW/CCW x10 Serratus push in supine Rt 2x10; 8# Rt sidelying ER 2x10; 3#   09/17/21 Therex: UBE L3.5 x 8 min (alt 2' each direction) Body Blade Flexion 3x30 sec; Abduction 3x30 sec 90 deg turns with stationary arm flex/abdct holding 2# weight 2x10 Cable rows 15# 3x10 Rt cross body stretch 3x30 sec hold Wall ladder flexion x 5 reps; 10 sec hold at end range Wall ladder scaption x 5 reps; 10 sec hold at end range Rt bicep curl 3# 3x10 Bent over tricep kickback 3# 3x10 IR stretch 10 x 10 sec hold with strap Flexion to 90; horizontal abduction; adduction then reverse 2#; 2x10   09/15/21 Therex: UBE L3 x 8 min (alt 2' each direction) Wall ladder flexion x 5 reps; 10 sec hold at end range Wall ladder scaption x 5 reps; 10 sec hold at end range IR stretch 10 x 10 sec hold with strap Rt shoulder flexion to 90 deg 2x10; 2# Rt shoulder scaption to 90 deg 2x10; 2# Rt bicep curl 2x10; 2# Cable rows 15# 2x10 Cable IR on Rt 10# 2x10 Cable ER on Rt 5# 2x10 Tricep press on cable 20# 2x10; bil Rt cross body stretch 3x30 sec hold Rt bent over row 2x10; 5# Rt bent over shoulder extension 2x10;  5#    PATIENT EDUCATION: Education details: HEP Person educated: Patient Education method: Consulting civil engineer, Media planner, and Handouts Education comprehension: verbalized understanding, returned demonstration, and needs further education     HOME EXERCISE PROGRAM: Access Code: [facility] URL: [email or web address] Date: 09/08/2021 Prepared by: Faustino Congress  Exercises - Seated Scapular Retraction  - 2 x daily - 7 x weekly - 1 sets - 10 reps - 5 sec hold - Standing Shoulder Abduction ROM with Dowel  - 2-3 x daily - 7 x weekly - 1-2 sets - 10 reps - 1-2 sec hold - Standing Shoulder Flexion AAROM with Dowel  - 2-3 x daily - 7 x weekly - 1-2 sets - 10 reps - 1-2 sec hold - Standing Shoulder Internal Rotation Stretch with Towel  - 2-3 x daily - 7 x weekly - 1-2 sets - 10 reps - 10 sec hold - Single Arm Shoulder Flexion with Dumbbell  - 1 x daily - 7 x weekly - 2-3 sets - 10 reps - Single Arm Scaption with Dumbbell  - 1 x daily - 7 x weekly - 2-3 sets - 10 reps - Seated Single Arm Elbow Flexion with Dumbbell  - 1 x daily - 7 x weekly - 2-3 sets - 10 reps - Standing Row with Anchored Resistance  - 1 x daily - 7 x weekly - 2-3 sets - 10 reps - Shoulder extension with resistance - Neutral  - 1 x daily - 7 x weekly - 2-3 sets - 10 reps - Shoulder Internal Rotation with Resistance  - 1 x daily - 7 x weekly - 2-3 sets - 10 reps - Shoulder External Rotation with Anchored Resistance  - 1 x daily - 7 x weekly - 2-3 sets - 10 reps    ASSESSMENT:   CLINICAL IMPRESSION: Pt arrived late to session today.  He has met some LTGs and partially met others.  Overall doing excellent with PT with main focus of continued strengthening in Rt shoulder.  He will see MD next week to determine if he  needs to continue PT or continue strengthening at home.    OBJECTIVE IMPAIRMENTS decreased ROM, decreased strength, increased edema, increased fascial restrictions, impaired UE functional use, postural  dysfunction, and pain.    ACTIVITY LIMITATIONS cleaning, community activity, driving, meal prep, occupation, laundry, and yard work.    PERSONAL FACTORS Past/current experiences and 3+ comorbidities: HTN, Lt knee OA, obesity, ACDF  are also affecting patient's functional outcome.      REHAB POTENTIAL: Good   CLINICAL DECISION MAKING: Stable/uncomplicated   EVALUATION COMPLEXITY: Low     GOALS: Goals reviewed with patient? Yes   SHORT TERM GOALS:   STG Name Target Date Goal status  1 Independent with initial HEP Baseline:  09/09/2021 Met 08/24/21  2 Rt shoulder PROM improved 15 deg all motions for improved function Baseline:  09/09/2021 Met 08/24/21  3 Report pain < 5/10 with sleeping for improved function Baseline: 09/09/2021 Met 08/31/21    LONG TERM GOALS:    LTG Name Target Date Goal status  1 Independent with final HEP Baseline: 10/07/2021 Ongoing 09/28/21  2 FOTO score improved to 64 for improved function Baseline: 10/07/2021 MET 09/28/21  3 Rt shoulder AROM improved to WNL for improved function Baseline: 10/07/2021 MET (except IR)  09/28/21  4 Report pain < 2/10 with sleeping and ADLs for improved function  Baseline: 10/07/2021 MET 09/28/21  5 Rt shoulder strength improved to at least 4/5  Baseline: 10/07/2021 Partially Met 09/28/21      PLAN: PT FREQUENCY: 1-2x/week   PT DURATION: 8 weeks   PLANNED INTERVENTIONS: Therapeutic exercises, Therapeutic activity, Neuromuscular re-education, Patient/Family education, Joint mobilization, Aquatic Therapy, Dry Needling, Electrical stimulation, Cryotherapy, Moist heat, scar mobilization, Taping, Vasopneumatic device, and Manual therapy   PLAN FOR NEXT SESSION: see what MD says, may be released from [place], PT, DPT 09/28/2021, 8:53 AM      PHYSICAL THERAPY DISCHARGE SUMMARY  Visits from Start of Care: 10  Current functional level related to goals / functional outcomes: See above   Remaining  deficits: See above   Education / Equipment: See above   Patient agrees to discharge. Patient goals were met. Patient is being discharged due to meeting the stated rehab goals.  Laureen Abrahams, PT, DPT 11/11/21 9:03 AM  [facility] [address] [place], [place], [address] Phone: [phone]   Fax:  [phone]

## 2021-10-01 ENCOUNTER — Encounter: Payer: PPO | Admitting: Physical Therapy

## 2021-10-05 ENCOUNTER — Encounter: Payer: PPO | Admitting: Physical Therapy

## 2021-10-07 ENCOUNTER — Other Ambulatory Visit (HOSPITAL_COMMUNITY): Payer: Self-pay

## 2021-10-07 ENCOUNTER — Ambulatory Visit (INDEPENDENT_AMBULATORY_CARE_PROVIDER_SITE_OTHER): Payer: PPO

## 2021-10-07 ENCOUNTER — Ambulatory Visit
Admission: RE | Admit: 2021-10-07 | Discharge: 2021-10-07 | Disposition: A | Discharge status: Home / Self Care | Payer: PPO | Source: Ambulatory Visit | Attending: Orthopaedic Surgery | Admitting: Orthopaedic Surgery

## 2021-10-07 ENCOUNTER — Ambulatory Visit (INDEPENDENT_AMBULATORY_CARE_PROVIDER_SITE_OTHER): Payer: PPO | Admitting: Orthopaedic Surgery

## 2021-10-07 DIAGNOSIS — M25552 Pain in left hip: Secondary | ICD-10-CM

## 2021-10-07 DIAGNOSIS — G4733 Obstructive sleep apnea (adult) (pediatric): Secondary | ICD-10-CM | POA: Diagnosis not present

## 2021-10-07 DIAGNOSIS — M1711 Unilateral primary osteoarthritis, right knee: Secondary | ICD-10-CM | POA: Diagnosis not present

## 2021-10-07 DIAGNOSIS — R269 Unspecified abnormalities of gait and mobility: Secondary | ICD-10-CM | POA: Diagnosis not present

## 2021-10-07 MED ORDER — OXYCODONE-ACETAMINOPHEN 5-325 MG PO TABS
1.0000 | ORAL_TABLET | Freq: Four times a day (QID) | ORAL | 0 refills | Status: DC | PRN
  Filled 2021-10-07: qty 30, 8d supply, fill #0

## 2021-10-07 NOTE — Progress Notes (Signed)
? ?Office Visit Note ?  ?Patient: James Gallegos           ?Date of Birth: 06/11/1951           ?MRN: [id number] ?Visit Date: 10/07/2021 ?             ?Requested by: Eunice Blase, MD ?[address] ?[place],  [place] [address] ?PCP: Eunice Blase, MD ? ?Chief Complaint  ?Patient presents with  ? Left Hip - Pain  ? ? ? ? ?HPI: ?Patient is a pleasant 71 year old gentleman who is 10 weeks status post right shoulder arthroscopy with mini open rotator cuff repair without he is doing very well.  He has no complaints.  At his last visit he was also evaluated for left hip pain.  X-rays were taken did not show any acute fractures.  He has tried Advil and icing.  Unfortunately he says the pain in his left hip has significantly increased to the point where he is having difficulty walking.  He points mostly posterior hip but it does wrap around to the front of his hip and goes down into his thigh.  He is scheduled to get an injection into the hip with Dr. Ernestina Patches next week.  He said the pain has gotten significant enough that he had to use oxycodone that he had leftover from his shoulder surgery.  He denies any paresthesias and he feels this feels different from his back pain. ? ? ?Assessment & Plan: ?Visit Diagnoses:  ?1. Pain in left hip   ? ? ?Plan: Significant increase in left hip pain.  X-rays did show some hypodense areas in the left femur and left femoral head.  Certainly this could be due to osteoporosis however because of this it with significant increase in his pain we have ordered a stat MRI that he can hopefully have before his hip injection.  We have given him a refill on his oxycodone as this seems to be the only thing that gives him relief. ? ?Follow-Up Instructions: No follow-ups on file.  ? ?Ortho Exam ? ?Patient is alert, oriented, no adenopathy, well-dressed, normal affect, normal respiratory effort. ?Patient has an antalgic gait and pain and difficulty weightbearing on the left hip.  He has no  tenderness in his spine no radicular findings.  Pain with manipulation of his hip that is posterior and wraps around to the groin.  Distal sensation and strength is intact ? ?Imaging: ?No results found. ?No images are attached to the encounter. ? ?Labs: ?Lab Results  ?Component Value Date  ? HGBA1C 11.1 (H) 04/08/2021  ? REPTSTATUS 07/15/2018 FINAL 07/14/2018  ? CULT  07/14/2018  ?  NO GROWTH ?Performed at [facility], [place] [address] [place],  [address] ?  ? ? ? ?Lab Results  ?Component Value Date  ? ALBUMIN 4.1 07/14/2018  ? ALBUMIN 4.4 10/07/2016  ? ALBUMIN 4.2 05/21/2016  ? ? ?No results found for: MG ?No results found for: VD25OH ? ?No results found for: PREALBUMIN ? ?  Latest Ref Rng & Units 04/08/2021  ?  8:34 AM 11/25/2020  ?  8:39 AM 04/10/2020  ?  8:47 AM  ?CBC EXTENDED  ?WBC 4.0 - 10.5 K/uL 5.5    4.7    ?RBC 4.22 - 5.81 MIL/uL 4.10    4.29    ?Hemoglobin 13.0 - 17.0 g/dL 13.5   14.6   14.0    ?HCT 39.0 - 52.0 % 37.8   43.0  42.1    ?Platelets 150 - 400 K/uL 179    194    ? ? ? ?There is no height or weight on file to calculate BMI. ? ?Orders:  ?Orders Placed This Encounter  ?Procedures  ? XR HIP UNILAT W OR W/O PELVIS 2-3 VIEWS LEFT  ? MR Hip Left w/o contrast  ? ?Meds ordered this encounter  ?Medications  ? oxyCODONE-acetaminophen (PERCOCET/ROXICET) 5-325 MG tablet  ?  Sig: Take 1 tablet by mouth every 6 (six) hours as needed for severe pain.  ?  Dispense:  30 tablet  ?  Refill:  0  ? ? ? Procedures: ?No procedures performed ? ?Clinical Data: ?No additional findings. ? ?ROS: ? ?All other systems negative, except as noted in the HPI. ?Review of Systems ? ?Objective: ?Vital Signs: There were no vitals taken for this visit. ? ?Specialty Comments:  ?No specialty comments available. ? ?PMFS History: ?Patient Active Problem List  ? Diagnosis Date Noted  ? Pain in left hip 09/03/2021  ? 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 Cervical spondylosis with myelopathy and radiculopathy 04/14/2020  ?  Impingement syndrome of right shoulder 10/10/2019  ? Low back pain 10/10/2019  ? Irritation of ulnar nerve, left 08/28/2019  ? Class 2 obesity due to excess calories without serious comorbidity with body mass index (BMI) of 39.0 to 39.9 in adult 01/08/2019  ? Pain in right elbow 12/06/2018  ? Primary osteoarthritis of left knee 07/25/2018  ? History of left knee replacement 07/25/2018  ? Right foot pain 06/22/2018  ? Acute left-sided low back pain with left-sided sciatica 06/22/2018  ? Osteoarthritis 11/21/2017  ? Hyperlipidemia 11/21/2017  ? Low testosterone 09/26/2016  ? Healthcare maintenance 09/26/2016  ? S/P total knee replacement using cement, right 06/01/2016  ? Sleep apnea 05/19/2016  ? Hypertension 05/19/2016  ? ?Past Medical History:  ?Diagnosis Date  ? Arthritis   ? back , R foot -  ? Deep vein thrombosis ([place])   ? after knee surgery in 2004  ? GERD (gastroesophageal reflux disease)   ? diet controlled no meds per pt  ? Hypertension   ? Murmur, cardiac   ? pt. reports its difficult to auscultate   ? 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 little finger and ring finger numb on side by little finger numbness/tingling /pain all the time per pt  ? Pneumonia   ? back in college days.   ? Pre-diabetes   ? Sleep apnea   ? CPAP- last study- 2014 cpap set on 13 per pt  ? Wears glasses 11/20/2020  ? for reading  ?  ?Family History  ?Problem Relation Age of Onset  ? Dementia Mother   ? Alzheimer's disease Father   ? Healthy Brother   ? Colon cancer Neg Hx   ? Esophageal cancer Neg Hx   ? Rectal cancer Neg Hx   ? Stomach cancer Neg Hx   ? Cancer Neg Hx   ? Diabetes Neg Hx   ?  ?Past Surgical History:  ?Procedure Laterality Date  ? ANTERIOR CERVICAL DECOMP/DISCECTOMY FUSION N/A 04/14/2020  ? Procedure: ANTERIOR CERVICAL DECOMPRESSION/DISCECTOMY FUSION, INTERBODY PROSTHESIS, PLATE/SCREWS CERVICAL THREE-FOUR,CERVICAL FOUR-FIVE,CERVICAL SIX-SEVEN;  Surgeon: Newman Pies, MD;  Location: [facility];  Service:  Neurosurgery;  Laterality: N/A;  anterior  ? [place] N/A 1990  ? C5-6 discectomy plates and screws in neck per pt  ? colonscopy  2017  ? polyps removed per pt follow up in 5  years  ? FINGER SURGERY Right 2008  ? x 3 index finger  ? KNEE ARTHROSCOPY Bilateral   ? L 2004, R 2005  ? MASS EXCISION Right 01/18/2017  ? Procedure: EXCISION BENIGN RIGHT NECK MASS;  Surgeon: Rozetta Nunnery, MD;  Location: [facility];  Service: ENT;  Laterality: Right;  ? MASS EXCISION Bilateral 03/23/2019  ? Procedure: EXCISIONS OF RIGHT CHEEK LESION, RIGHT POSTERIOR NECK LIPOMA  AND LEFT NECK NODULE;  Surgeon: Rozetta Nunnery, MD;  Location: [facility];  Service: ENT;  Laterality: Bilateral;  ? NASAL FRACTURE SURGERY  1980  ? TOTAL KNEE ARTHROPLASTY Right 06/01/2016  ? Procedure: TOTAL KNEE ARTHROPLASTY;  Surgeon: [name], MD;  Location: [facility];  Service: Orthopedics;  Laterality: Right;  ? TOTAL KNEE ARTHROPLASTY Left 07/25/2018  ? Procedure: LEFT TOTAL KNEE ARTHROPLASTY;  Surgeon: [name], MD;  Location: [facility];  Service: Orthopedics;  Laterality: Left;  ? ULNAR NERVE TRANSPOSITION Left 11/25/2020  ? Procedure: ulnar nerve decompression left elbow;  Surgeon: [name], MD;  Location: [facility];  Service: Orthopedics;  Laterality: Left;  ? ?Social History  ? ?Occupational History  ? Not on file  ?Tobacco Use  ? Smoking status: Never  ? Smokeless tobacco: Never  ?Vaping Use  ? Vaping Use: Never used  ?Substance and Sexual Activity  ? Alcohol use: Yes  ?  Comment: rarely wine  ? Drug use: Never  ? Sexual activity: Not on file  ? ? ? ? ? ?

## 2021-10-08 ENCOUNTER — Encounter: Payer: PPO | Admitting: Physical Therapy

## 2021-10-12 ENCOUNTER — Encounter: Payer: PPO | Admitting: Physical Medicine and Rehabilitation

## 2021-10-19 ENCOUNTER — Encounter: Payer: Self-pay | Admitting: Orthopaedic Surgery

## 2021-10-19 ENCOUNTER — Other Ambulatory Visit: Payer: Self-pay

## 2021-10-19 DIAGNOSIS — M25552 Pain in left hip: Secondary | ICD-10-CM

## 2021-11-03 ENCOUNTER — Other Ambulatory Visit (HOSPITAL_COMMUNITY): Payer: Self-pay

## 2021-11-03 ENCOUNTER — Ambulatory Visit: Payer: PPO | Admitting: Orthopaedic Surgery

## 2021-11-03 ENCOUNTER — Encounter: Payer: Self-pay | Admitting: Orthopaedic Surgery

## 2021-11-03 VITALS — Ht 69.0 in | Wt 216.8 lb

## 2021-11-03 DIAGNOSIS — M1612 Unilateral primary osteoarthritis, left hip: Secondary | ICD-10-CM | POA: Diagnosis not present

## 2021-11-03 MED ORDER — TRAMADOL HCL 50 MG PO TABS
100.0000 mg | ORAL_TABLET | Freq: Four times a day (QID) | ORAL | 0 refills | Status: DC | PRN
  Filled 2021-11-03: qty 40, 5d supply, fill #0

## 2021-11-03 NOTE — Progress Notes (Signed)
The patient is referred from Dr. Durward Fortes with debilitating left hip pain and known significant osteoarthritis of the left hip that has become acutely worse.  He is 71 years old and very active.  He has plain films and MRI confirming significant arthritis around the left hip joint itself as well as inflammation around the joint and the soft tissues.  He has had an intra-articular injection with a steroid under direct radiographic imaging with ultrasound.  That did not help him at all.  He walks with a limp and has a hard time getting shoes and socks on.  At this point his rating down to his left knee.  He has had both his knees replaced.  He is taking ibuprofen now for pain.  He is not diabetic.  His BMI is 32.  He has severe pain with internal and external rotation of his left hip.  His right hip moves more smoothly.  I did go over the plain films and the MRI with him.  There is subchondral edema in the femoral head and acetabulum on the left hip with severe arthritic changes and cartilage loss.  There is also inflammation around the soft tissue of the hip itself.  There is no evidence of infection.  We talked in length in detail about hip replacement surgery.  I gave him a handout about hip replacement surgery and showed him a hip replacement model and went over his films with him.  I described what the surgery involves in detail.  We talked about the risks and benefits of surgery and what to expect from an intraoperative and postoperative course.  All questions and concerns were answered and addressed.  We will work on getting this scheduled.

## 2021-11-06 DIAGNOSIS — M1711 Unilateral primary osteoarthritis, right knee: Secondary | ICD-10-CM | POA: Diagnosis not present

## 2021-11-06 DIAGNOSIS — R269 Unspecified abnormalities of gait and mobility: Secondary | ICD-10-CM | POA: Diagnosis not present

## 2021-11-06 DIAGNOSIS — G4733 Obstructive sleep apnea (adult) (pediatric): Secondary | ICD-10-CM | POA: Diagnosis not present

## 2021-11-23 ENCOUNTER — Other Ambulatory Visit (HOSPITAL_COMMUNITY): Payer: Self-pay

## 2021-11-23 ENCOUNTER — Other Ambulatory Visit: Payer: Self-pay | Admitting: Orthopaedic Surgery

## 2021-11-23 MED ORDER — TRAMADOL HCL 50 MG PO TABS
100.0000 mg | ORAL_TABLET | Freq: Four times a day (QID) | ORAL | 0 refills | Status: DC | PRN
  Filled 2021-11-23: qty 40, 5d supply, fill #0

## 2021-11-24 ENCOUNTER — Other Ambulatory Visit: Payer: Self-pay | Admitting: Physician Assistant

## 2021-11-24 ENCOUNTER — Other Ambulatory Visit: Payer: Self-pay

## 2021-11-24 DIAGNOSIS — M1612 Unilateral primary osteoarthritis, left hip: Secondary | ICD-10-CM

## 2021-12-02 NOTE — Progress Notes (Addendum)
Anesthesia Review:  PCP: Eunice Blase  Cardiologist : none  Chest x-ray : EKG : 04/08/21  Echo : Stress test: Cardiac Cath :  Activity level: can do a fight of stairs without difficulty  Sleep Study/ CPAP : has cpap  Fasting Blood Sugar :      / Checks Blood Sugar -- times a day:   Blood Thinner/ Instructions /Last Dose: ASA / Instructions/ Last Dose :   DM- type 2  Hgba1c- 12/08/21- 6.4  Does not check glucose at home  Pt was 27 minutes late for preop appt.  Med hx and instructions completed along iwht travel screening.

## 2021-12-02 NOTE — Progress Notes (Signed)
DUE TO COVID-19 ONLY  2 VISITOR IS ALLOWED TO COME WITH YOU AND STAY IN THE WAITING ROOM ONLY DURING PRE OP AND PROCEDURE DAY OF SURGERY.  4  VISITOR  MAY VISIT WITH YOU AFTER SURGERY IN YOUR PRIVATE ROOM DURING VISITING HOURS ONLY! YOU MAY HAVE ONE PERSON SPEND THE NITE WITH YOU IN YOUR ROOM AFTER SURGERY.    ING.    Your procedure is scheduled on:         12/18/2021   Report to [facility] Main  Entrance   Report to admitting at     0515           AM DO NOT [place], PICTURE ID OR WALLET DAY OF SURGERY.      Call this number if you have problems the morning of surgery [phone]    REMEMBER: NO  SOLID FOODS , CANDY, GUM OR MINTS AFTER [place] .       Mar[facility] Kitchen CLEAR LIQUIDS UNTIL       0400 am          DAY OF SURGERY.      PLEASE FINISH ENSURE DRINK PER SURGEON ORDER  WHICH NEEDS TO BE COMPLETED AT     0400am     MORNING OF SURGERY.       CLEAR LIQUID DIET   Foods Allowed      WATER BLACK COFFEE ( SUGAR OK, NO MILK, CREAM OR CREAMER) REGULAR AND DECAF  TEA ( SUGAR OK NO MILK, CREAM, OR CREAMER) REGULAR AND DECAF  PLAIN JELLO ( NO RED)  FRUIT ICES ( NO RED, NO FRUIT PULP)  POPSICLES ( NO RED)  JUICE- APPLE, WHITE GRAPE AND WHITE CRANBERRY  SPORT DRINK LIKE GATORADE ( NO RED)  CLEAR BROTH ( VEGETABLE , CHICKEN OR BEEF)                                                                     BRUSH YOUR TEETH MORNING OF SURGERY AND RINSE YOUR MOUTH OUT, NO CHEWING GUM CANDY OR MINTS.     Take these medicines the morning of surgery with A SIP OF WATER:  none    DO NOT TAKE ANY DIABETIC MEDICATIONS DAY OF YOUR SURGERY                               You may not have any metal on your body including hair pins and              piercings  Do not wear jewelry, make-up, lotions, powders or perfumes, deodorant             Do not wear nail polish on your fingernails.              IF YOU ARE A MALE AND WANT TO SHAVE UNDER ARMS OR LEGS PRIOR TO  SURGERY YOU MUST DO SO AT LEAST 48 HOURS PRIOR TO SURGERY.              Men may shave face and neck.   Do not bring valuables to the hospital. [facility] IS NOT  RESPONSIBLE   FOR VALUABLES.  Contacts, dentures or bridgework may not be worn into surgery.  Leave suitcase in the car. After surgery it may be brought to your room.     Patients discharged the day of surgery will not be allowed to drive home. IF YOU ARE HAVING SURGERY AND GOING HOME THE SAME DAY, YOU MUST HAVE AN ADULT TO DRIVE YOU HOME AND BE WITH YOU FOR 24 HOURS. YOU MAY GO HOME BY TAXI OR UBER OR ORTHERWISE, BUT AN ADULT MUST ACCOMPANY YOU HOME AND STAY WITH YOU FOR 24 HOURS.                Please read over the following fact sheets you were given: _____________________________________________________________________  [facility] - Preparing for Surgery Before surgery, you can play an important role.  Because skin is not sterile, your skin needs to be as free of germs as possible.  You can reduce the number of germs on your skin by washing with CHG (chlorahexidine gluconate) soap before surgery.  CHG is an antiseptic cleaner which kills germs and bonds with the skin to continue killing germs even after washing. Please DO NOT use if you have an allergy to CHG or antibacterial soaps.  If your skin becomes reddened/irritated stop using the CHG and inform your nurse when you arrive at Short Stay. Do not shave (including legs and underarms) for at least 48 hours prior to the first CHG shower.  You may shave your face/neck. Please follow these instructions carefully:  1.  Shower with CHG Soap the night before surgery and the  morning of Surgery.  2.  If you choose to wash your hair, wash your hair first as usual with your  normal  shampoo.  3.  After you shampoo, rinse your hair and body thoroughly to remove the  shampoo.                           4.  Use CHG as you would any other liquid soap.  You can apply chg directly   to the skin and wash                       Gently with a scrungie or clean washcloth.  5.  Apply the CHG Soap to your body ONLY FROM THE NECK DOWN.   Do not use on face/ open                           Wound or open sores. Avoid contact with eyes, ears mouth and genitals (private parts).                       Wash face,  Genitals (private parts) with your normal soap.             6.  Wash thoroughly, paying special attention to the area where your surgery  will be performed.  7.  Thoroughly rinse your body with warm water from the neck down.  8.  DO NOT shower/wash with your normal soap after using and rinsing off  the CHG Soap.                9.  Pat yourself dry with a clean towel.            10.  Wear clean pajamas.  11.  Place clean sheets on your bed the night of your first shower and do not  sleep with pets. Day of Surgery : Do not apply any lotions/deodorants the morning of surgery.  Please wear clean clothes to the hospital/surgery center.  FAILURE TO FOLLOW THESE INSTRUCTIONS MAY RESULT IN THE CANCELLATION OF YOUR SURGERY PATIENT SIGNATURE_________________________________  NURSE SIGNATURE__________________________________  ________________________________________________________________________

## 2021-12-07 DIAGNOSIS — R269 Unspecified abnormalities of gait and mobility: Secondary | ICD-10-CM | POA: Diagnosis not present

## 2021-12-07 DIAGNOSIS — M1711 Unilateral primary osteoarthritis, right knee: Secondary | ICD-10-CM | POA: Diagnosis not present

## 2021-12-07 DIAGNOSIS — G4733 Obstructive sleep apnea (adult) (pediatric): Secondary | ICD-10-CM | POA: Diagnosis not present

## 2021-12-08 ENCOUNTER — Encounter (HOSPITAL_COMMUNITY)
Admission: RE | Admit: 2021-12-08 | Discharge: 2021-12-08 | Disposition: A | Discharge status: Home / Self Care | Payer: PPO | Source: Ambulatory Visit | Attending: Orthopaedic Surgery | Admitting: Orthopaedic Surgery

## 2021-12-08 ENCOUNTER — Encounter (HOSPITAL_COMMUNITY): Payer: Self-pay

## 2021-12-08 ENCOUNTER — Other Ambulatory Visit: Payer: Self-pay

## 2021-12-08 VITALS — BP 165/66 | HR 63 | Temp 98.3°F | Resp 16 | Ht 69.0 in | Wt 200.0 lb

## 2021-12-08 DIAGNOSIS — M1612 Unilateral primary osteoarthritis, left hip: Secondary | ICD-10-CM | POA: Diagnosis not present

## 2021-12-08 DIAGNOSIS — Z01812 Encounter for preprocedural laboratory examination: Secondary | ICD-10-CM | POA: Diagnosis not present

## 2021-12-08 DIAGNOSIS — Z01818 Encounter for other preprocedural examination: Secondary | ICD-10-CM

## 2021-12-08 HISTORY — DX: Type 2 diabetes mellitus without complications: E11.9

## 2021-12-08 LAB — CBC
HCT: 42.9 % (ref 39.0–52.0)
Hemoglobin: 14.4 g/dL (ref 13.0–17.0)
MCH: 31.1 pg (ref 26.0–34.0)
MCHC: 33.6 g/dL (ref 30.0–36.0)
MCV: 92.7 fL (ref 80.0–100.0)
Platelets: 200 10*3/uL (ref 150–400)
RBC: 4.63 MIL/uL (ref 4.22–5.81)
RDW: 12.7 % (ref 11.5–15.5)
WBC: 5.7 10*3/uL (ref 4.0–10.5)
nRBC: 0 % (ref 0.0–0.2)

## 2021-12-08 LAB — BASIC METABOLIC PANEL
Anion gap: 9 (ref 5–15)
BUN: 25 mg/dL — ABNORMAL HIGH (ref 8–23)
CO2: 23 mmol/L (ref 22–32)
Calcium: 9.6 mg/dL (ref 8.9–10.3)
Chloride: 108 mmol/L (ref 98–111)
Creatinine, Ser: 0.95 mg/dL (ref 0.61–1.24)
GFR, Estimated: >60 mL/min (ref >60)
Glucose, Bld: 119 mg/dL — ABNORMAL HIGH (ref 70–99)
Potassium: 4.5 mmol/L (ref 3.5–5.1)
Sodium: 140 mmol/L (ref 135–145)

## 2021-12-08 LAB — SURGICAL PCR SCREEN
MRSA, PCR: NEGATIVE
Staphylococcus aureus: NEGATIVE

## 2021-12-08 LAB — GLUCOSE, CAPILLARY: Glucose-Capillary: 117 mg/dL — ABNORMAL HIGH (ref 70–99)

## 2021-12-09 LAB — HEMOGLOBIN A1C
Hgb A1c MFr Bld: 6.4 % — ABNORMAL HIGH (ref 4.8–5.6)
Mean Plasma Glucose: 137 mg/dL

## 2021-12-10 ENCOUNTER — Telehealth: Payer: Self-pay | Admitting: *Deleted

## 2021-12-10 DIAGNOSIS — M1612 Unilateral primary osteoarthritis, left hip: Secondary | ICD-10-CM

## 2021-12-10 DIAGNOSIS — M25552 Pain in left hip: Secondary | ICD-10-CM

## 2021-12-10 NOTE — Telephone Encounter (Signed)
Ortho bundle pre-op call completed. 

## 2021-12-10 NOTE — Telephone Encounter (Signed)
Spoke to patient prior to his upcoming surgery for L-THA on 12/18/21 at [facility]. He is concerned regarding all pharmacies being closed when he discharges over the weekend. He asked if you could send medications in prior to his surgery for pick up at [facility]

## 2021-12-10 NOTE — Care Plan (Signed)
[facility] RNCM call to patient and discussed his upcoming Left total hip arthroplasty with Dr. Ninfa Linden on 12/18/21. He is an Ortho bundle patient through [facility] and is agreeable to case management. He lives with his spouse, who will be assisting after surgery. He has requested to skip home health PT and go straight to OPPT here at [facility]. Appointment scheduled for Monday, 12/21/21 at 11:45 am. He has a RW and 3in1/BSC already and needs no other DME. Reviewed all post op care instructions and will mail copy of these to patient's home. Will continue to follow for needs.

## 2021-12-11 ENCOUNTER — Other Ambulatory Visit (HOSPITAL_COMMUNITY): Payer: Self-pay

## 2021-12-11 ENCOUNTER — Other Ambulatory Visit: Payer: Self-pay | Admitting: Orthopaedic Surgery

## 2021-12-11 MED ORDER — METHOCARBAMOL 500 MG PO TABS
500.0000 mg | ORAL_TABLET | Freq: Four times a day (QID) | ORAL | 1 refills | Status: DC | PRN
  Filled 2021-12-11: qty 40, 10d supply, fill #0
  Filled 2021-12-21: qty 40, 10d supply, fill #1

## 2021-12-11 MED ORDER — OXYCODONE HCL 5 MG PO TABS
5.0000 mg | ORAL_TABLET | ORAL | 0 refills | Status: DC | PRN
  Filled 2021-12-11: qty 30, 3d supply, fill #0

## 2021-12-17 ENCOUNTER — Encounter (HOSPITAL_COMMUNITY): Payer: Self-pay | Admitting: Orthopaedic Surgery

## 2021-12-17 NOTE — H&P (Signed)
TOTAL HIP ADMISSION H&P  Patient is admitted for left total hip arthroplasty.  Subjective:  Chief Complaint: left hip pain  HPI: James Gallegos, 71 y.o. male, has a history of pain and functional disability in the left hip(s) due to arthritis and patient has failed non-surgical conservative treatments for greater than 12 weeks to include NSAID's and/or analgesics, corticosteriod injections, flexibility and strengthening excercises, use of assistive devices, weight reduction as appropriate, and activity modification.  Onset of symptoms was gradual starting 1 years ago with rapidlly worsening course since that time.The patient noted no past surgery on the left hip(s).  Patient currently rates pain in the left hip at 10 out of 10 with activity. Patient has night pain, worsening of pain with activity and weight bearing, trendelenberg gait, pain that interfers with activities of daily living, and pain with passive range of motion. Patient has evidence of subchondral cysts, subchondral sclerosis, periarticular osteophytes, and joint space narrowing by imaging studies. This condition presents safety issues increasing the risk of falls.  There is no current active infection.  Patient Active Problem List   Diagnosis Date Noted   Unilateral primary osteoarthritis, left hip 11/03/2021   Pain in left hip 09/03/2021   Neuropathy, 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   Cervical spondylosis with myelopathy and radiculopathy 04/14/2020   Impingement syndrome of right shoulder 10/10/2019   Low back pain 10/10/2019   Irritation of ulnar nerve, left 08/28/2019   Class 2 obesity due to excess calories without serious comorbidity with body mass index (BMI) of 39.0 to 39.9 in adult 01/08/2019   Pain in right elbow 12/06/2018   Primary osteoarthritis of left knee 07/25/2018   History of left knee replacement 07/25/2018   Right foot pain 06/22/2018   Acute left-sided low back pain with left-sided sciatica 06/22/2018    Osteoarthritis 11/21/2017   Hyperlipidemia 11/21/2017   Low testosterone 09/26/2016   Healthcare maintenance 09/26/2016   S/P total knee replacement using cement, right 06/01/2016   Sleep apnea 05/19/2016   Hypertension 05/19/2016   Past Medical History:  Diagnosis Date   Arthritis    back , R foot -   Deep vein thrombosis ([place])    after knee surgery in 2004   Diabetes mellitus without complication ([place])    Hypertension    Murmur, cardiac    pt. reports its difficult to auscultate    Sleep apnea    CPAP- last study- 2014 cpap set on 13 per pt   Wears glasses 11/20/2020   for reading    Past Surgical History:  Procedure Laterality Date   ANTERIOR CERVICAL DECOMP/DISCECTOMY FUSION N/A 04/14/2020   Procedure: ANTERIOR CERVICAL DECOMPRESSION/DISCECTOMY FUSION, INTERBODY PROSTHESIS, PLATE/SCREWS CERVICAL THREE-FOUR,CERVICAL FOUR-FIVE,CERVICAL SIX-SEVEN;  Surgeon: Newman Pies, MD;  Location: [place];  Service: Neurosurgery;  Laterality: N/A;  anterior   CERVICAL SPINE SURGERY N/A 1990   C5-6 discectomy plates and screws in neck per pt   colonscopy  2017   polyps removed per pt follow up in 5 years   FINGER SURGERY Right 2008   x 3 index finger   KNEE ARTHROSCOPY Bilateral    L 2004, R 2005   MASS EXCISION Right 01/18/2017   Procedure: EXCISION BENIGN RIGHT NECK MASS;  Surgeon: Rozetta Nunnery, MD;  Location: [place];  Service: ENT;  Laterality: Right;   MASS EXCISION Bilateral 03/23/2019   Procedure: EXCISIONS OF RIGHT CHEEK LESION, RIGHT POSTERIOR NECK LIPOMA  AND LEFT NECK NODULE;  Surgeon: Rozetta Nunnery, MD;  Location: [place];  Service: ENT;  Laterality: Bilateral;   NASAL FRACTURE SURGERY  1980   TOTAL KNEE ARTHROPLASTY Right 06/01/2016   Procedure: TOTAL KNEE ARTHROPLASTY;  Surgeon: [name], MD;  Location: [place];  Service: Orthopedics;  Laterality: Right;   TOTAL KNEE ARTHROPLASTY Left 07/25/2018   Procedure:  LEFT TOTAL KNEE ARTHROPLASTY;  Surgeon: [name], MD;  Location: [place];  Service: Orthopedics;  Laterality: Left;   ULNAR NERVE TRANSPOSITION Left 11/25/2020   Procedure: ulnar nerve decompression left elbow;  Surgeon: [name], MD;  Location: [place];  Service: Orthopedics;  Laterality: Left;    No current facility-administered medications for this encounter.   Current Outpatient Medications  Medication Sig Dispense Refill Last Dose   amoxicillin (AMOXIL) 500 MG capsule Take 2,000 mg by mouth See admin instructions. For dental procedure      Ascorbic Acid (VITAMIN C) 1000 MG tablet Take 3,000 mg by mouth daily.      B Complex-Biotin-FA (B-100 B-COMPLEX) TABS Take 1 tablet by mouth daily.      Cholecalciferol (VITAMIN D-3) 5000 units TABS Take 5,000 Units by mouth daily.      Cyanocobalamin (VITAMIN B-12) 5000 MCG TBDP Take 5,000 mcg by mouth daily.      DHEA 50 MG CAPS Take 50 mg by mouth daily.       Diindolylmethane POWD Take 250 mg by mouth daily. Capsule  "DIM"      EPINEPHrine (EPIPEN 2-PAK) 0.3 mg/0.3 mL IJ SOAJ injection Inject as needed (Patient taking differently: Inject 0.3 mg into the muscle as needed for anaphylaxis.) 2 each 6    fluticasone (FLONASE) 50 MCG/ACT nasal spray Place 1 spray into both nostrils at bedtime as needed for allergies or rhinitis.       losartan-hydrochlorothiazide (HYZAAR) 100-25 MG tablet Take 1 tablet by mouth daily. 90 tablet 3    Lysine HCl 500 MG TABS Take 1,000 mg by mouth daily.       Magnesium 400 MG TABS Take 400 mg by mouth daily at 8 pm.       Menaquinone-7 (VITAMIN K2 PO) Take 200 mcg by mouth daily.      PRESCRIPTION MEDICATION Apply 1 application topically at bedtime. Testerone 13.5 % applied to shoulder [facility]  135 mg/ mL      QUERCETIN PO Take 1,000 mg by mouth daily.      Saw [place]metto, Serenoa repens, 320 MG CAPS Take 320 mg by mouth daily.      traMADol (ULTRAM) 50 MG tablet Take 2 tablets (100  mg total) by mouth every 6 (six) hours as needed. 40 tablet 0    zinc gluconate 50 MG tablet Take 50 mg by mouth daily.      methocarbamol (ROBAXIN) 500 MG tablet Take 1 tablet (500 mg total) by mouth every 6 (six) hours as needed. 40 tablet 1    oxyCODONE (ROXICODONE) 5 MG immediate release tablet Take 1-2 tablets (5-10 mg total) by mouth every 4 (four) hours as needed for severe pain. 30 tablet 0    Allergies  Allergen Reactions   Yellow Jacket Venom Anaphylaxis    Social History   Tobacco Use   Smoking status: Never   Smokeless tobacco: Never  Substance Use Topics   Alcohol use: Yes    Comment: rarely wine    Family History  Problem Relation Age of Onset   Dementia Mother    Alzheimer's disease Father  Healthy Brother    Colon cancer Neg Hx    Esophageal cancer Neg Hx    Rectal cancer Neg Hx    Stomach cancer Neg Hx    Cancer Neg Hx    Diabetes Neg Hx      Review of Systems  Musculoskeletal:  Positive for gait problem and joint swelling.  All other systems reviewed and are negative.   Objective:  Physical Exam Vitals reviewed.  Constitutional:      Appearance: Normal appearance.  HENT:     Head: Normocephalic and atraumatic.  Eyes:     Extraocular Movements: Extraocular movements intact.     Pupils: Pupils are equal, round, and reactive to light.  Cardiovascular:     Rate and Rhythm: Normal rate.  Pulmonary:     Effort: Pulmonary effort is normal.     Breath sounds: Normal breath sounds.  Abdominal:     Palpations: Abdomen is soft.  Musculoskeletal:     Cervical back: Normal range of motion and neck supple.     Left hip: Tenderness and bony tenderness present. Decreased range of motion. Decreased strength.  Neurological:     Mental Status: He is alert and oriented to person, place, and time.  Psychiatric:        Behavior: Behavior normal.     Vital signs in last 24 hours:    Labs:   Estimated body mass index is 29.53 kg/m as calculated from  the following:   Height as of 12/08/21: '5\' 9"'$  (1.753 m).   Weight as of 12/08/21: 90.7 kg.   Imaging Review Plain radiographs demonstrate severe degenerative joint disease of the left hip(s). The bone quality appears to be good for age and reported activity level.      Assessment/Plan:  End stage arthritis, left hip(s)  The patient history, physical examination, clinical judgement of the provider and imaging studies are consistent with end stage degenerative joint disease of the left hip(s) and total hip arthroplasty is deemed medically necessary. The treatment options including medical management, injection therapy, arthroscopy and arthroplasty were discussed at length. The risks and benefits of total hip arthroplasty were presented and reviewed. The risks due to aseptic loosening, infection, stiffness, dislocation/subluxation,  thromboembolic complications and other imponderables were discussed.  The patient acknowledged the explanation, agreed to proceed with the plan and consent was signed. Patient is being admitted for inpatient treatment for surgery, pain control, PT, OT, prophylactic antibiotics, VTE prophylaxis, progressive ambulation and ADL's and discharge planning.The patient is planning to be discharged home with home health services

## 2021-12-17 NOTE — Anesthesia Preprocedure Evaluation (Addendum)
Anesthesia Evaluation  Patient identified by MRN, date of birth, ID band Patient awake    Reviewed: Allergy & Precautions, NPO status , Patient's Chart, lab work & pertinent test results, reviewed documented beta blocker date and time   Airway Mallampati: III  TM Distance: >3 FB Neck ROM: Full    Dental  (+) Teeth Intact, Caps, Dental Advisory Given   Pulmonary sleep apnea and Continuous Positive Airway Pressure Ventilation ,    Pulmonary exam normal breath sounds clear to auscultation       Cardiovascular hypertension, Pt. on medications Normal cardiovascular exam+ Valvular Problems/Murmurs  Rhythm:Regular Rate:Normal     Neuro/Psych  Neuromuscular disease negative psych ROS   GI/Hepatic negative GI ROS, Neg liver ROS,   Endo/Other  diabetes, Well Controlled, Type 2Hyperlipidemia Low testosterone syndrome  Renal/GU negative Renal ROS  negative genitourinary   Musculoskeletal  (+) Arthritis , Osteoarthritis,  Left hip OA   Abdominal   Peds  Hematology negative hematology ROS (+)   Anesthesia Other Findings   Reproductive/Obstetrics                           Anesthesia Physical Anesthesia Plan  ASA: 3  Anesthesia Plan: Spinal   Post-op Pain Management: Minimal or no pain anticipated and Tylenol PO (pre-op)*   Induction: Intravenous  PONV Risk Score and Plan: 2 and Propofol infusion, Ondansetron and Treatment may vary due to age or medical condition  Airway Management Planned: Natural Airway, Simple Face Mask and Nasal Cannula  Additional Equipment:   Intra-op Plan:   Post-operative Plan:   Informed Consent: I have reviewed the patients History and Physical, chart, labs and discussed the procedure including the risks, benefits and alternatives for the proposed anesthesia with the patient or authorized representative who has indicated his/her understanding and acceptance.      Dental advisory given  Plan Discussed with: CRNA and Anesthesiologist  Anesthesia Plan Comments:        Anesthesia Quick Evaluation

## 2021-12-18 ENCOUNTER — Ambulatory Visit (HOSPITAL_COMMUNITY): Payer: PPO | Admitting: Physician Assistant

## 2021-12-18 ENCOUNTER — Encounter (HOSPITAL_COMMUNITY): Payer: Self-pay | Admitting: Orthopaedic Surgery

## 2021-12-18 ENCOUNTER — Other Ambulatory Visit: Payer: Self-pay

## 2021-12-18 ENCOUNTER — Ambulatory Visit (HOSPITAL_BASED_OUTPATIENT_CLINIC_OR_DEPARTMENT_OTHER): Payer: PPO | Admitting: Anesthesiology

## 2021-12-18 ENCOUNTER — Observation Stay (HOSPITAL_COMMUNITY): Payer: PPO

## 2021-12-18 ENCOUNTER — Ambulatory Visit (HOSPITAL_COMMUNITY): Payer: PPO

## 2021-12-18 ENCOUNTER — Observation Stay (HOSPITAL_COMMUNITY)
Admission: RE | Admit: 2021-12-18 | Discharge: 2021-12-20 | Disposition: A | Discharge status: Home / Self Care | Payer: PPO | Source: Ambulatory Visit | Attending: Orthopaedic Surgery | Admitting: Orthopaedic Surgery

## 2021-12-18 ENCOUNTER — Encounter (HOSPITAL_COMMUNITY)
Admission: RE | Disposition: A | Discharge status: Home / Self Care | Payer: Self-pay | Source: Ambulatory Visit | Attending: Orthopaedic Surgery

## 2021-12-18 DIAGNOSIS — Z79899 Other long term (current) drug therapy: Secondary | ICD-10-CM | POA: Diagnosis not present

## 2021-12-18 DIAGNOSIS — Z96642 Presence of left artificial hip joint: Secondary | ICD-10-CM

## 2021-12-18 DIAGNOSIS — I10 Essential (primary) hypertension: Secondary | ICD-10-CM

## 2021-12-18 DIAGNOSIS — Z01818 Encounter for other preprocedural examination: Secondary | ICD-10-CM

## 2021-12-18 DIAGNOSIS — M1612 Unilateral primary osteoarthritis, left hip: Secondary | ICD-10-CM

## 2021-12-18 DIAGNOSIS — Z86718 Personal history of other venous thrombosis and embolism: Secondary | ICD-10-CM | POA: Diagnosis not present

## 2021-12-18 DIAGNOSIS — E1140 Type 2 diabetes mellitus with diabetic neuropathy, unspecified: Secondary | ICD-10-CM | POA: Insufficient documentation

## 2021-12-18 DIAGNOSIS — Z96653 Presence of artificial knee joint, bilateral: Secondary | ICD-10-CM | POA: Insufficient documentation

## 2021-12-18 DIAGNOSIS — Z9989 Dependence on other enabling machines and devices: Secondary | ICD-10-CM | POA: Diagnosis not present

## 2021-12-18 DIAGNOSIS — G4733 Obstructive sleep apnea (adult) (pediatric): Secondary | ICD-10-CM | POA: Diagnosis not present

## 2021-12-18 DIAGNOSIS — Z471 Aftercare following joint replacement surgery: Secondary | ICD-10-CM | POA: Diagnosis not present

## 2021-12-18 HISTORY — PX: TOTAL HIP ARTHROPLASTY: SHX124

## 2021-12-18 LAB — GLUCOSE, CAPILLARY
Glucose-Capillary: 129 mg/dL — ABNORMAL HIGH (ref 70–99)
Glucose-Capillary: 135 mg/dL — ABNORMAL HIGH (ref 70–99)

## 2021-12-18 LAB — TYPE AND SCREEN
ABO/RH(D): A POS
Antibody Screen: NEGATIVE

## 2021-12-18 SURGERY — ARTHROPLASTY, HIP, TOTAL, ANTERIOR APPROACH
Anesthesia: Spinal | Site: Hip | Laterality: Left

## 2021-12-18 MED ORDER — OXYCODONE HCL 5 MG/5ML PO SOLN: 5.0000 mg | Freq: Once | ORAL | Status: DC | PRN

## 2021-12-18 MED ORDER — ORAL CARE MOUTH RINSE
15.0000 mL | Freq: Once | OROMUCOSAL | Status: AC
Start: 2021-12-18 — End: 2021-12-18

## 2021-12-18 MED ORDER — TRANEXAMIC ACID-NACL 1000-0.7 MG/100ML-% IV SOLN
1000.0000 mg | INTRAVENOUS | Status: AC
  Administered 2021-12-18: 1000 mg via INTRAVENOUS
  Filled 2021-12-18: qty 100

## 2021-12-18 MED ORDER — MIDAZOLAM HCL 2 MG/2ML IJ SOLN
INTRAMUSCULAR | Status: AC
  Filled 2021-12-18: qty 2

## 2021-12-18 MED ORDER — ONDANSETRON HCL 4 MG/2ML IJ SOLN
INTRAMUSCULAR | Status: AC
  Filled 2021-12-18: qty 2

## 2021-12-18 MED ORDER — STERILE WATER FOR IRRIGATION IR SOLN
Status: DC | PRN
  Administered 2021-12-18: 2000 mL

## 2021-12-18 MED ORDER — B-100 B-COMPLEX PO TABS: 1.0000 | ORAL_TABLET | Freq: Every day | ORAL | Status: DC

## 2021-12-18 MED ORDER — METHOCARBAMOL 500 MG IVPB - SIMPLE MED
INTRAVENOUS | Status: AC
  Filled 2021-12-18: qty 55

## 2021-12-18 MED ORDER — CHLORHEXIDINE GLUCONATE 0.12 % MT SOLN
15.0000 mL | Freq: Once | OROMUCOSAL | Status: AC
  Administered 2021-12-18: 15 mL via OROMUCOSAL

## 2021-12-18 MED ORDER — ONDANSETRON HCL 4 MG/2ML IJ SOLN: 4.0000 mg | Freq: Once | INTRAMUSCULAR | Status: DC | PRN

## 2021-12-18 MED ORDER — FLUTICASONE PROPIONATE 50 MCG/ACT NA SUSP
1.0000 | Freq: Every evening | NASAL | Status: DC | PRN
Start: 2021-12-18 — End: 2021-12-20

## 2021-12-18 MED ORDER — HYDROMORPHONE HCL 1 MG/ML IJ SOLN
INTRAMUSCULAR | Status: AC
  Administered 2021-12-18: 0.5 mg via INTRAVENOUS
  Filled 2021-12-18: qty 2

## 2021-12-18 MED ORDER — DEXAMETHASONE SODIUM PHOSPHATE 10 MG/ML IJ SOLN
INTRAMUSCULAR | Status: AC
  Filled 2021-12-18: qty 1

## 2021-12-18 MED ORDER — LOSARTAN POTASSIUM-HCTZ 100-25 MG PO TABS: 1.0000 | ORAL_TABLET | Freq: Every day | ORAL | Status: DC

## 2021-12-18 MED ORDER — 0.9 % SODIUM CHLORIDE (POUR BTL) OPTIME
TOPICAL | Status: DC | PRN
  Administered 2021-12-18: 1000 mL

## 2021-12-18 MED ORDER — LACTATED RINGERS IV SOLN: INTRAVENOUS | Status: DC

## 2021-12-18 MED ORDER — VITAMIN K2 100 MCG PO CAPS: 200.0000 ug | ORAL_CAPSULE | Freq: Every day | ORAL | Status: DC

## 2021-12-18 MED ORDER — HYDROMORPHONE HCL 1 MG/ML IJ SOLN: 0.2500 mg | INTRAMUSCULAR | Status: DC | PRN

## 2021-12-18 MED ORDER — ASPIRIN 81 MG PO CHEW
81.0000 mg | CHEWABLE_TABLET | Freq: Two times a day (BID) | ORAL | Status: DC
  Administered 2021-12-18 – 2021-12-20 (×4): 81 mg via ORAL
  Filled 2021-12-18 (×4): qty 1

## 2021-12-18 MED ORDER — DOCUSATE SODIUM 100 MG PO CAPS
100.0000 mg | ORAL_CAPSULE | Freq: Two times a day (BID) | ORAL | Status: DC
  Administered 2021-12-18 – 2021-12-20 (×4): 100 mg via ORAL
  Filled 2021-12-18 (×4): qty 1

## 2021-12-18 MED ORDER — POVIDONE-IODINE 10 % EX SWAB: 2.0000 "application " | Freq: Once | CUTANEOUS | Status: DC

## 2021-12-18 MED ORDER — DEXAMETHASONE SODIUM PHOSPHATE 10 MG/ML IJ SOLN
INTRAMUSCULAR | Status: DC | PRN
  Administered 2021-12-18: 10 mg via INTRAVENOUS

## 2021-12-18 MED ORDER — METOCLOPRAMIDE HCL 5 MG PO TABS
5.0000 mg | ORAL_TABLET | Freq: Three times a day (TID) | ORAL | Status: DC | PRN
  Filled 2021-12-18: qty 2

## 2021-12-18 MED ORDER — PROPOFOL 500 MG/50ML IV EMUL
INTRAVENOUS | Status: DC | PRN
  Administered 2021-12-18: 50 ug/kg/min via INTRAVENOUS

## 2021-12-18 MED ORDER — METOCLOPRAMIDE HCL 5 MG/ML IJ SOLN: 5.0000 mg | Freq: Three times a day (TID) | INTRAMUSCULAR | Status: DC | PRN

## 2021-12-18 MED ORDER — FENTANYL CITRATE (PF) 100 MCG/2ML IJ SOLN
INTRAMUSCULAR | Status: AC
  Filled 2021-12-18: qty 2

## 2021-12-18 MED ORDER — DHEA 50 MG PO CAPS: 50.0000 mg | ORAL_CAPSULE | Freq: Every day | ORAL | Status: DC

## 2021-12-18 MED ORDER — HYDROMORPHONE HCL 1 MG/ML IJ SOLN
0.5000 mg | INTRAMUSCULAR | Status: DC | PRN
  Administered 2021-12-18: 1 mg via INTRAVENOUS
  Administered 2021-12-18 (×3): 0.5 mg via INTRAVENOUS
  Filled 2021-12-18 (×2): qty 1

## 2021-12-18 MED ORDER — ASCORBIC ACID 500 MG PO TABS
3000.0000 mg | ORAL_TABLET | Freq: Every day | ORAL | Status: DC
  Administered 2021-12-19 – 2021-12-20 (×2): 3000 mg via ORAL
  Filled 2021-12-18 (×2): qty 6

## 2021-12-18 MED ORDER — ONDANSETRON HCL 4 MG/2ML IJ SOLN
INTRAMUSCULAR | Status: DC | PRN
  Administered 2021-12-18: 4 mg via INTRAVENOUS

## 2021-12-18 MED ORDER — ONDANSETRON HCL 4 MG PO TABS
4.0000 mg | ORAL_TABLET | Freq: Four times a day (QID) | ORAL | Status: DC | PRN
  Filled 2021-12-18: qty 1

## 2021-12-18 MED ORDER — ALUM & MAG HYDROXIDE-SIMETH 200-200-20 MG/5ML PO SUSP: 30.0000 mL | ORAL | Status: DC | PRN

## 2021-12-18 MED ORDER — SODIUM CHLORIDE 0.9 % IR SOLN
Status: DC | PRN
  Administered 2021-12-18: 1000 mL

## 2021-12-18 MED ORDER — BUPIVACAINE IN DEXTROSE 0.75-8.25 % IT SOLN
INTRATHECAL | Status: DC | PRN
  Administered 2021-12-18: 2 mL via INTRATHECAL

## 2021-12-18 MED ORDER — POVIDONE-IODINE 10 % EX SWAB: Freq: Once | CUTANEOUS | Status: AC

## 2021-12-18 MED ORDER — FENTANYL CITRATE (PF) 100 MCG/2ML IJ SOLN
INTRAMUSCULAR | Status: DC | PRN
  Administered 2021-12-18 (×2): 50 ug via INTRAVENOUS

## 2021-12-18 MED ORDER — MIDAZOLAM HCL 5 MG/5ML IJ SOLN
INTRAMUSCULAR | Status: DC | PRN
  Administered 2021-12-18: 2 mg via INTRAVENOUS

## 2021-12-18 MED ORDER — ACETAMINOPHEN 325 MG PO TABS
325.0000 mg | ORAL_TABLET | Freq: Four times a day (QID) | ORAL | Status: DC | PRN
  Administered 2021-12-19 – 2021-12-20 (×2): 650 mg via ORAL
  Filled 2021-12-18 (×2): qty 2

## 2021-12-18 MED ORDER — LYSINE HCL 500 MG PO TABS: 1000.0000 mg | ORAL_TABLET | Freq: Every day | ORAL | Status: DC

## 2021-12-18 MED ORDER — OXYCODONE HCL 5 MG PO TABS
ORAL_TABLET | ORAL | Status: AC
  Filled 2021-12-18: qty 2

## 2021-12-18 MED ORDER — PANTOPRAZOLE SODIUM 40 MG PO TBEC
40.0000 mg | DELAYED_RELEASE_TABLET | Freq: Every day | ORAL | Status: DC
  Administered 2021-12-19 – 2021-12-20 (×2): 40 mg via ORAL
  Filled 2021-12-18 (×2): qty 1

## 2021-12-18 MED ORDER — OXYCODONE HCL 5 MG PO TABS
10.0000 mg | ORAL_TABLET | ORAL | Status: DC | PRN
  Administered 2021-12-18: 10 mg via ORAL
  Administered 2021-12-18 – 2021-12-19 (×4): 15 mg via ORAL
  Administered 2021-12-20: 10 mg via ORAL
  Filled 2021-12-18 (×5): qty 3

## 2021-12-18 MED ORDER — MAGNESIUM OXIDE -MG SUPPLEMENT 400 (240 MG) MG PO TABS
400.0000 mg | ORAL_TABLET | Freq: Every day | ORAL | Status: DC
  Administered 2021-12-18 – 2021-12-19 (×2): 400 mg via ORAL
  Filled 2021-12-18 (×2): qty 1

## 2021-12-18 MED ORDER — ONDANSETRON HCL 4 MG/2ML IJ SOLN: 4.0000 mg | Freq: Four times a day (QID) | INTRAMUSCULAR | Status: DC | PRN

## 2021-12-18 MED ORDER — VITAMIN D 25 MCG (1000 UNIT) PO TABS
5000.0000 [IU] | ORAL_TABLET | Freq: Every day | ORAL | Status: DC
  Administered 2021-12-19 – 2021-12-20 (×2): 5000 [IU] via ORAL
  Filled 2021-12-18 (×2): qty 5

## 2021-12-18 MED ORDER — VITAMIN B-12 1000 MCG PO TABS
5000.0000 ug | ORAL_TABLET | Freq: Every day | ORAL | Status: DC
  Administered 2021-12-19 – 2021-12-20 (×2): 5000 ug via ORAL
  Filled 2021-12-18 (×2): qty 5

## 2021-12-18 MED ORDER — PROPOFOL 1000 MG/100ML IV EMUL
INTRAVENOUS | Status: AC
  Filled 2021-12-18: qty 100

## 2021-12-18 MED ORDER — OXYCODONE HCL 5 MG PO TABS
5.0000 mg | ORAL_TABLET | ORAL | Status: DC | PRN
  Administered 2021-12-18: 5 mg via ORAL
  Administered 2021-12-19: 10 mg via ORAL
  Administered 2021-12-19: 5 mg via ORAL
  Administered 2021-12-20: 10 mg via ORAL
  Filled 2021-12-18 (×5): qty 2

## 2021-12-18 MED ORDER — ZINC GLUCONATE 50 MG PO TABS: 50.0000 mg | ORAL_TABLET | Freq: Every day | ORAL | Status: DC

## 2021-12-18 MED ORDER — OXYCODONE HCL 5 MG PO TABS
ORAL_TABLET | ORAL | Status: AC
  Administered 2021-12-18: 10 mg via ORAL
  Filled 2021-12-18: qty 1

## 2021-12-18 MED ORDER — PROPOFOL 500 MG/50ML IV EMUL
INTRAVENOUS | Status: AC
  Filled 2021-12-18: qty 50

## 2021-12-18 MED ORDER — HYDROCHLOROTHIAZIDE 25 MG PO TABS
25.0000 mg | ORAL_TABLET | Freq: Every day | ORAL | Status: DC
  Administered 2021-12-19 – 2021-12-20 (×2): 25 mg via ORAL
  Filled 2021-12-18 (×2): qty 1

## 2021-12-18 MED ORDER — METHOCARBAMOL 500 MG PO TABS
500.0000 mg | ORAL_TABLET | Freq: Four times a day (QID) | ORAL | Status: DC | PRN
  Administered 2021-12-18 – 2021-12-20 (×6): 500 mg via ORAL
  Filled 2021-12-18 (×5): qty 1

## 2021-12-18 MED ORDER — OXYCODONE HCL 5 MG PO TABS: 5.0000 mg | ORAL_TABLET | Freq: Once | ORAL | Status: DC | PRN

## 2021-12-18 MED ORDER — SODIUM CHLORIDE 0.9 % IV SOLN: INTRAVENOUS | Status: DC

## 2021-12-18 MED ORDER — METHOCARBAMOL 500 MG PO TABS
ORAL_TABLET | ORAL | Status: AC
  Filled 2021-12-18: qty 1

## 2021-12-18 MED ORDER — CEFAZOLIN SODIUM-DEXTROSE 1-4 GM/50ML-% IV SOLN
1.0000 g | Freq: Four times a day (QID) | INTRAVENOUS | Status: AC
  Administered 2021-12-18 (×2): 1 g via INTRAVENOUS
  Filled 2021-12-18 (×2): qty 50

## 2021-12-18 MED ORDER — METHOCARBAMOL 500 MG IVPB - SIMPLE MED
500.0000 mg | Freq: Four times a day (QID) | INTRAVENOUS | Status: DC | PRN
  Administered 2021-12-18: 500 mg via INTRAVENOUS

## 2021-12-18 MED ORDER — LOSARTAN POTASSIUM 50 MG PO TABS
100.0000 mg | ORAL_TABLET | Freq: Every day | ORAL | Status: DC
  Administered 2021-12-19 – 2021-12-20 (×2): 100 mg via ORAL
  Filled 2021-12-18 (×2): qty 2

## 2021-12-18 MED ORDER — CEFAZOLIN SODIUM-DEXTROSE 2-4 GM/100ML-% IV SOLN
2.0000 g | INTRAVENOUS | Status: AC
  Administered 2021-12-18: 2 g via INTRAVENOUS
  Filled 2021-12-18: qty 100

## 2021-12-18 SURGICAL SUPPLY — 47 items
ACETAB CUP W/GRIPTION 54 (Plate) ×2 IMPLANT
APL SKNCLS STERI-STRIP NONHPOA (GAUZE/BANDAGES/DRESSINGS)
BAG COUNTER SPONGE SURGICOUNT (BAG) ×2 IMPLANT
BAG SPEC THK2 15X12 ZIP CLS (MISCELLANEOUS) ×1
BAG SPNG CNTER NS LX DISP (BAG) ×1
BAG ZIPLOCK 12X15 (MISCELLANEOUS) ×1 IMPLANT
BALL HIP ARTICU EZE 36 8.5 (Hips) IMPLANT
BENZOIN TINCTURE PRP APPL 2/3 (GAUZE/BANDAGES/DRESSINGS) IMPLANT
BLADE SAW SGTL 18X1.27X75 (BLADE) ×2 IMPLANT
COVER PERINEAL POST (MISCELLANEOUS) ×2 IMPLANT
COVER SURGICAL LIGHT HANDLE (MISCELLANEOUS) ×2 IMPLANT
CUP ACETAB W/GRIPTION 54 (Plate) IMPLANT
DRAPE FOOT SWITCH (DRAPES) ×2 IMPLANT
DRAPE STERI IOBAN 125X83 (DRAPES) ×2 IMPLANT
DRAPE U-SHAPE 47X51 STRL (DRAPES) ×4 IMPLANT
DRESSING AQUACEL AG SP 3.5X10 (GAUZE/BANDAGES/DRESSINGS) IMPLANT
DRSG AQUACEL AG ADV 3.5X10 (GAUZE/BANDAGES/DRESSINGS) ×2 IMPLANT
DRSG AQUACEL AG SP 3.5X10 (GAUZE/BANDAGES/DRESSINGS) ×2
DURAPREP 26ML APPLICATOR (WOUND CARE) ×2 IMPLANT
ELECT REM PT RETURN 15FT ADLT (MISCELLANEOUS) ×2 IMPLANT
GAUZE XEROFORM 1X8 LF (GAUZE/BANDAGES/DRESSINGS) ×2 IMPLANT
GLOVE BIO SURGEON STRL SZ7.5 (GLOVE) ×2 IMPLANT
GLOVE BIOGEL PI IND STRL 8 (GLOVE) ×2 IMPLANT
GLOVE BIOGEL PI INDICATOR 8 (GLOVE) ×2
GLOVE ECLIPSE 8.0 STRL XLNG CF (GLOVE) ×2 IMPLANT
GOWN STRL REUS W/ TWL XL LVL3 (GOWN DISPOSABLE) ×2 IMPLANT
GOWN STRL REUS W/TWL XL LVL3 (GOWN DISPOSABLE) ×4
HANDPIECE INTERPULSE COAX TIP (DISPOSABLE) ×2
HIP BALL ARTICU EZE 36 8.5 (Hips) ×2 IMPLANT
HOLDER FOLEY CATH W/STRAP (MISCELLANEOUS) ×2 IMPLANT
KIT TURNOVER KIT A (KITS) ×1 IMPLANT
LINER NEUTRAL 54X36MM PLUS 4 (Hips) ×1 IMPLANT
PACK ANTERIOR HIP CUSTOM (KITS) ×2 IMPLANT
SET HNDPC FAN SPRY TIP SCT (DISPOSABLE) ×1 IMPLANT
STAPLER VISISTAT 35W (STAPLE) IMPLANT
STEM FEMORAL SZ6 HIGH ACTIS (Stem) ×1 IMPLANT
STRIP CLOSURE SKIN 1/2X4 (GAUZE/BANDAGES/DRESSINGS) IMPLANT
SUT ETHIBOND NAB CT1 #1 30IN (SUTURE) ×2 IMPLANT
SUT ETHILON 2 0 PS N (SUTURE) IMPLANT
SUT MNCRL AB 4-0 PS2 18 (SUTURE) IMPLANT
SUT VIC AB 0 CT1 36 (SUTURE) ×2 IMPLANT
SUT VIC AB 1 CT1 36 (SUTURE) ×2 IMPLANT
SUT VIC AB 2-0 CT1 27 (SUTURE) ×4
SUT VIC AB 2-0 CT1 TAPERPNT 27 (SUTURE) ×2 IMPLANT
TRAY FOLEY MTR SLVR 16FR STAT (SET/KITS/TRAYS/PACK) ×1 IMPLANT
TUBING CONNECTING 10 (TUBING) ×1 IMPLANT
YANKAUER SUCT BULB TIP NO VENT (SUCTIONS) ×2 IMPLANT

## 2021-12-18 NOTE — Care Plan (Signed)
Ortho Bundle Case Management Note  Patient Details  Name: James Gallegos MRN: [id number] Date of Birth: 09/08/1950                  [facility] RNCM call to patient and discussed his upcoming Left total hip arthroplasty with Dr. Ninfa Linden on 12/18/21. He is an Ortho bundle patient through [facility] and is agreeable to case management. He lives with his spouse, who will be assisting after surgery. He has requested to skip home health PT and go straight to OPPT here at [facility] Appointment scheduled for Monday, 12/21/21 at 11:45 am. He has a RW and 3in1/BSC already and needs no other DME. Reviewed all post op care instructions and will mail copy of these to patient's home. Will continue to follow for needs.   DME Arranged:   (Patient has all DME he reports at home (RW, 3in1/BSC)) DME Agency:     HH Arranged:   (Patient requested to go straight to OPPT- already set up) [facility] Agency:     Additional Comments: Please contact me with any questions of if this plan should need to change.  Jamse Arn, RN, BSN, [employer]  [phone] 12/18/2021, 1:00 PM

## 2021-12-18 NOTE — Evaluation (Signed)
Physical Therapy Evaluation Patient Details Name: James Gallegos MRN: [id number] DOB: 01/23/1951 Today's Date: 12/18/2021  History of Present Illness  Pt s/p L THR and with hx of DM, Bil TKR and cervical fusion  Clinical Impression  Pt  s/p L THR and presents with decreased L LE strength/ROM and post op pain limiting functional mobility.  Pt should progress to dc home with family assist and reports first OP PT scheduled for Monday 12/21/21.     Recommendations for follow up therapy are one component of a multi-disciplinary discharge planning process, led by the attending physician.  Recommendations may be updated based on patient status, additional functional criteria and insurance authorization.  Follow Up Recommendations Follow physician's recommendations for discharge plan and follow up therapies      Assistance Recommended at Discharge Intermittent Supervision/Assistance  Patient can return home with the following  A little help with walking and/or transfers;A little help with bathing/dressing/bathroom;Assistance with cooking/housework;Help with stairs or ramp for entrance;Assist for transportation    Equipment Recommendations None recommended by PT  Recommendations for Other Services       Functional Status Assessment Patient has had a recent decline in their functional status and demonstrates the ability to make significant improvements in function in a reasonable and predictable amount of time.     Precautions / Restrictions Precautions Precautions: Fall Restrictions Weight Bearing Restrictions: No Other Position/Activity Restrictions: WBAT      Mobility  Bed Mobility Overal bed mobility: Needs Assistance Bed Mobility: Supine to Sit     Supine to sit: Min assist     General bed mobility comments: cues for sequence and use of R LE to self assist    Transfers Overall transfer level: Needs assistance Equipment used: Rolling walker (2 wheels) Transfers: Sit  to/from Stand Sit to Stand: Min assist           General transfer comment: cues for LE management and use of UEs to self assist    Ambulation/Gait Ambulation/Gait assistance: Min assist, Min guard Gait Distance (Feet): 88 Feet Assistive device: Rolling walker (2 wheels) Gait Pattern/deviations: Step-to pattern, Decreased step length - right, Decreased step length - left, Shuffle, Trunk flexed Gait velocity: decr     General Gait Details: cues for posture, position from RW and initial sequence  Stairs            Wheelchair Mobility    Modified Rankin (Stroke Patients Only)       Balance Overall balance assessment: Mild deficits observed, not formally tested                                           Pertinent Vitals/Pain Pain Assessment Pain Assessment: 0-10 Pain Score: 6  Pain Location: L hip Pain Descriptors / Indicators: Aching, Sore Pain Intervention(s): Limited activity within patient's tolerance, Monitored during session, Premedicated before session, Ice applied    Home Living Family/patient expects to be discharged to:: Private residence Living Arrangements: Spouse/significant other;Children Available Help at Discharge: Family Type of Home: House Home Access: Stairs to enter Entrance Stairs-Rails: Right Entrance Stairs-Number of Steps: 3   Home Layout: One level Home Equipment: Conservation officer, nature (2 wheels);Cane - single point;Crutches      Prior Function Prior Level of Function : Independent/Modified Independent                     Hand  Dominance        Extremity/Trunk Assessment   Upper Extremity Assessment Upper Extremity Assessment: Overall WFL for tasks assessed    Lower Extremity Assessment Lower Extremity Assessment: LLE deficits/detail    Cervical / Trunk Assessment Cervical / Trunk Assessment: Normal  Communication   Communication: No difficulties  Cognition Arousal/Alertness: Awake/alert Behavior  During Therapy: WFL for tasks assessed/performed Overall Cognitive Status: Within Functional Limits for tasks assessed                                          General Comments      Exercises Total Joint Exercises Ankle Circles/Pumps: AROM, Both, 15 reps, Supine   Assessment/Plan    PT Assessment Patient needs continued PT services  PT Problem List Decreased strength;Decreased range of motion;Decreased activity tolerance;Decreased balance;Decreased mobility;Decreased knowledge of use of DME;Pain       PT Treatment Interventions DME instruction;Gait training;Stair training;Functional mobility training;Therapeutic activities;Therapeutic exercise;Balance training;Patient/family education    PT Goals (Current goals can be found in the Care Plan section)  Acute Rehab PT Goals Patient Stated Goal: regain IND PT Goal Formulation: With patient Time For Goal Achievement: 12/25/21 Potential to Achieve Goals: Good    Frequency 7X/week     Co-evaluation               AM-PAC PT "6 Clicks" Mobility  Outcome Measure Help needed turning from your back to your side while in a flat bed without using bedrails?: A Little Help needed moving from lying on your back to sitting on the side of a flat bed without using bedrails?: A Little Help needed moving to and from a bed to a chair (including a wheelchair)?: A Little Help needed standing up from a chair using your arms (e.g., wheelchair or bedside chair)?: A Little Help needed to walk in hospital room?: A Little Help needed climbing 3-5 steps with a railing? : A Lot 6 Click Score: 17    End of Session Equipment Utilized During Treatment: Gait belt Activity Tolerance: Patient tolerated treatment well Patient left: in chair;with call bell/phone within reach;with chair alarm set Nurse Communication: Mobility status PT Visit Diagnosis: Difficulty in walking, not elsewhere classified (R26.2)    Time: 2641-5830 PT Time  Calculation (min) (ACUTE ONLY): 33 min   Charges:   PT Evaluation $PT Eval Low Complexity: 1 Low PT Treatments $Gait Training: 8-22 mins        [place] Pager [phone] Office [phone]   Kaydin Karbowski 12/18/2021, 4:52 PM

## 2021-12-18 NOTE — Anesthesia Postprocedure Evaluation (Signed)
Anesthesia Post Note  Patient: James Gallegos  Procedure(s) Performed: LEFT TOTAL HIP ARTHROPLASTY ANTERIOR APPROACH (Left: Hip)     Patient location during evaluation: PACU Anesthesia Type: Spinal Level of consciousness: oriented and awake and alert Pain management: pain level controlled Vital Signs Assessment: post-procedure vital signs reviewed and stable Respiratory status: spontaneous breathing, respiratory function stable and nonlabored ventilation Cardiovascular status: blood pressure returned to baseline and stable Postop Assessment: no headache, no backache, no apparent nausea or vomiting, spinal receding and patient able to bend at knees Anesthetic complications: no   No notable events documented.  Last Vitals:  Vitals:   12/18/21 1115 12/18/21 1130  BP: 132/60 132/60  Pulse: 60 60  Resp: 14 11  Temp: 36.6 C   SpO2: 100% 99%    Last Pain:  Vitals:   12/18/21 1115  TempSrc:   PainSc: 6                  Eriko Economos A.

## 2021-12-18 NOTE — Interval H&P Note (Signed)
History and Physical Interval Note: The patient understands that he is here today for a left total hip replacement to treat his left hip osteoarthritis.  There has been no interval or acute change in his medical status.  See recent H&P.  The risks and benefits of surgery been discussed in detail and informed consent is obtained.  The left operative hip has been marked.  12/18/2021 6:59 AM  Danielle Rankin  has presented today for surgery, with the diagnosis of left hip osteoarthritis.  The various methods of treatment have been discussed with the patient and family. After consideration of risks, benefits and other options for treatment, the patient has consented to  Procedure(s): LEFT TOTAL HIP ARTHROPLASTY ANTERIOR APPROACH (Left) as a surgical intervention.  The patient's history has been reviewed, patient examined, no change in status, stable for surgery.  I have reviewed the patient's chart and labs.  Questions were answered to the patient's satisfaction.     Mcarthur Rossetti

## 2021-12-18 NOTE — Brief Op Note (Signed)
12/18/2021  8:28 AM  PATIENT:  James Gallegos  71 y.o. male  PRE-OPERATIVE DIAGNOSIS:  left hip osteoarthritis  POST-OPERATIVE DIAGNOSIS:  left hip osteoarthritis  PROCEDURE:  Procedure(s): LEFT TOTAL HIP ARTHROPLASTY ANTERIOR APPROACH (Left)  SURGEON:  Surgeon(s) and Role:    Mcarthur Rossetti, MD - Primary  PHYSICIAN ASSISTANT:  Benita Stabile, PA-C  ANESTHESIA:   spinal  EBL:  300 mL   COUNTS:  YES  DICTATION: .Other Dictation: Dictation Number [id number]  PLAN OF CARE: Admit for overnight observation  PATIENT DISPOSITION:  PACU - hemodynamically stable.   Delay start of Pharmacological VTE agent (>24hrs) due to surgical blood loss or risk of bleeding: no

## 2021-12-18 NOTE — Transfer of Care (Signed)
Immediate Anesthesia Transfer of Care Note  Patient: James Gallegos  Procedure(s) Performed: LEFT TOTAL HIP ARTHROPLASTY ANTERIOR APPROACH (Left: Hip)  Patient Location: PACU  Anesthesia Type:Spinal  Level of Consciousness: awake  Airway & Oxygen Therapy: Patient Spontanous Breathing  Post-op Assessment: Report given to RN  Post vital signs: stable  Last Vitals:  Vitals Value Taken Time  BP 126/56 12/18/21 0847  Temp    Pulse 68 12/18/21 0853  Resp 17 12/18/21 0853  SpO2 96 % 12/18/21 0853  Vitals shown include unvalidated device data.  Last Pain:  Vitals:   12/18/21 0608  TempSrc: Oral  PainSc:       Patients Stated Pain Goal: 3 (62/69/48 [id number])  Complications: No notable events documented.

## 2021-12-18 NOTE — Anesthesia Procedure Notes (Signed)
Spinal  Patient location during procedure: OR Start time: 12/18/2021 7:14 AM End time: 12/18/2021 7:18 AM Reason for block: surgical anesthesia Staffing Performed: anesthesiologist  Anesthesiologist: Josephine Igo, MD Performed by: Josephine Igo, MD Authorized by: Josephine Igo, MD   Preanesthetic Checklist Completed: patient identified, IV checked, site marked, risks and benefits discussed, surgical consent, monitors and equipment checked, pre-op evaluation and timeout performed Spinal Block Patient position: sitting Prep: DuraPrep and site prepped and draped Patient monitoring: heart rate, cardiac monitor, continuous pulse ox and blood pressure Approach: midline Location: L3-4 Injection technique: single-shot Needle Needle type: Pencan  Needle gauge: 24 G Needle length: 9 cm Needle insertion depth: 7 cm Assessment Sensory level: T4 Events: CSF return Additional Notes Patient tolerated procedure well. Adequate sensory level.

## 2021-12-18 NOTE — Op Note (Signed)
NAME: James Gallegos, James Gallegos MEDICAL RECORD NO: [id number] ACCOUNT NO: [id number] DATE OF BIRTH: 1950/12/14 FACILITY: [facility] LOCATION: [facility] PHYSICIAN: Lind Guest. Ninfa Linden, MD  Operative Report   DATE OF PROCEDURE: 12/18/2021   PREOPERATIVE DIAGNOSIS:  Primary osteoarthritis and degenerative joint disease, left hip.  POSTOPERATIVE DIAGNOSIS:  Primary osteoarthritis and degenerative joint disease, left hip.  PROCEDURE:  Left total hip arthroplasty through direct anterior approach.  IMPLANTS:  DePuy sector Gription acetabular component size 54, size 36+4 neutral polyethylene liner, size 6 ACTIS femoral component with high offset, size 36+8.5 metal hip ball.  SURGEON:  Dr. Lind Guest. Ninfa Linden.  ASSISTANT:  Benita Stabile, PA-C.  ANESTHESIA:  Spinal.  ANTIBIOTICS:  2 g IV Ancef.  ESTIMATED BLOOD LOSS:  193 mL  COMPLICATIONS:  None.  INDICATIONS:  The patient is a 71 year old gentleman with debilitating osteoarthritis involving his left hip.  This has been well documented with clinical exam and x-ray findings.  At this point, he has tried and failed conservative treatment for over a  year and his hip pain is detrimentally affecting his mobility, his activities of daily living and his quality of life to the point he does wish to proceed with a total hip arthroplasty.  We explained in detail the risk of acute blood loss anemia, nerve  or vessel injury, fracture, infection, implant failure, dislocation, leg length differences and skin and soft tissue issues.  We talked about our goals being decreased pain, improve mobility and overall improve quality of life.  DESCRIPTION OF PROCEDURE:  After informed consent was obtained, appropriate left hip was marked, he was brought to the operating room and sat up on the stretcher where spinal anesthesia was obtained.  He was laid in supine position on the stretcher.   Foley catheter was placed and traction boots were placed on both his feet.  He was  then placed supine on the Hana fracture table with the perineal post in place and both legs in line skeletal traction devices.  No traction applied.  His left operative  hip was prepped and draped with DuraPrep and sterile drapes.  A timeout was called.  He was identified correct patient, correct left hip.  I then made an incision just inferior and posterior to the anterior superior iliac spine and carried this obliquely  down the leg.  We dissected down tensor fascia lata muscle.  Tensor fascia was then divided longitudinally to proceed with direct anterior approach to the hip.  We identified and cauterized circumflex vessels then identified the hip capsule, opened the  hip capsule in L-type format finding a moderate joint effusion.  There was also large paralabral cyst.  We removed that.  We then placed Cobra retractors around the medial and lateral femoral neck and made a femoral neck cut with oscillating saw just  proximal to the lesser trochanter and completed this with an osteotome.  We placed a corkscrew guide in the femoral head and removed the femoral head in its entirety and found a wide area devoid of cartilage.  I then placed a bent Hohmann over the medial  acetabular rim and removed remnants of acetabular labrum and other debris.  We then began reaming under direct visualization from a size 43 reamer in a stepwise increments going up to a size 53 reamer with all reamers placed under direct visualization,  the last reamer was also placed under direct fluoroscopy, so I could obtain my depth of reaming, my inclination and anteversion.  I then placed real DePuy sector  Gription acetabular component size 54 and a 36+4 polyethylene liner based off offset and  medialization of the cup.  Attention was then turned to the femur.  With the leg externally rotated to 120 degrees and extended and adducted, we were able to place a Mueller retractor medially and Hohmann retractor behind the greater trochanter.  I   released lateral joint capsule and used a box cutting osteotome to enter femoral canal and a rongeur to lateralize, then began broaching using the ACTIS broaching system from a size 0 going up to a size 6.  With a size 6 in place, we trialed a high  offset femoral neck and a 36+1.5 hip ball and reduced this in acetabulum.  We needed definitely go with more leg length.  We dislocated the hip, removed the trial components.  We placed the real ACTIS femoral component, size 6 with high offset and went  with a 36+8.5 hip ball since he was significantly short and he tends to externally rotate the way he lies down.  We reduced this into the acetabulum.  We were pleased with the range of motion and stability as well as increasing his leg lengths.  We then  irrigated the soft tissue with normal saline solution.  We closed the joint capsule with interrupted #1 Ethibond suture followed by #1 Vicryl to close the tensor fascia.  0 Vicryl was used to close the deep tissue and 2-0 Vicryl was used to close  subcutaneous tissue.  The skin was closed with staples.  A well-padded sterile dressing was applied.  He was taken off the Hana table and taken to recovery room in stable condition with all final counts being correct.  There were no complications noted.   Of note, Benita Stabile, PA-C, assisted during the entire case from beginning to end.  His assistance was medically necessary and crucial for helping mobilize and retract soft tissues as well as guiding implant placement.  He was directly involved with a  layered closure of the wound.     Elián.Darby D: 12/18/2021 8:27:01 am T: 12/18/2021 8:45:00 am  JOB: [id number]/ [id number]

## 2021-12-19 DIAGNOSIS — M1612 Unilateral primary osteoarthritis, left hip: Secondary | ICD-10-CM | POA: Diagnosis not present

## 2021-12-19 LAB — BASIC METABOLIC PANEL
Anion gap: 8 (ref 5–15)
BUN: 20 mg/dL (ref 8–23)
CO2: 23 mmol/L (ref 22–32)
Calcium: 8.2 mg/dL — ABNORMAL LOW (ref 8.9–10.3)
Chloride: 106 mmol/L (ref 98–111)
Creatinine, Ser: 0.92 mg/dL (ref 0.61–1.24)
GFR, Estimated: >60 mL/min (ref >60)
Glucose, Bld: 150 mg/dL — ABNORMAL HIGH (ref 70–99)
Potassium: 3.3 mmol/L — ABNORMAL LOW (ref 3.5–5.1)
Sodium: 137 mmol/L (ref 135–145)

## 2021-12-19 LAB — CBC
HCT: 30.4 % — ABNORMAL LOW (ref 39.0–52.0)
Hemoglobin: 10.6 g/dL — ABNORMAL LOW (ref 13.0–17.0)
MCH: 32.2 pg (ref 26.0–34.0)
MCHC: 34.9 g/dL (ref 30.0–36.0)
MCV: 92.4 fL (ref 80.0–100.0)
Platelets: 155 10*3/uL (ref 150–400)
RBC: 3.29 MIL/uL — ABNORMAL LOW (ref 4.22–5.81)
RDW: 12.7 % (ref 11.5–15.5)
WBC: 8.4 10*3/uL (ref 4.0–10.5)
nRBC: 0 % (ref 0.0–0.2)

## 2021-12-19 MED ORDER — ASPIRIN 81 MG PO CHEW
81.0000 mg | CHEWABLE_TABLET | Freq: Two times a day (BID) | ORAL | 0 refills | Status: DC
  Filled 2021-12-19: qty 35, 18d supply, fill #0

## 2021-12-19 NOTE — Progress Notes (Signed)
Physical Therapy Treatment Patient Details Name: James Gallegos MRN: [id number] DOB: 01/29/1951 Today's Date: 12/19/2021   History of Present Illness Pt s/p L THR and with hx of DM, Bil TKR and cervical fusion    PT Comments    Pt motivated and progressing steadily with mobility but continues to struggle with bed mobility.  Pt hopeful for dc home this pm.   Recommendations for follow up therapy are one component of a multi-disciplinary discharge planning process, led by the attending physician.  Recommendations may be updated based on patient status, additional functional criteria and insurance authorization.  Follow Up Recommendations  Follow physician's recommendations for discharge plan and follow up therapies     Assistance Recommended at Discharge Intermittent Supervision/Assistance  Patient can return home with the following A little help with walking and/or transfers;A little help with bathing/dressing/bathroom;Assistance with cooking/housework;Help with stairs or ramp for entrance;Assist for transportation   Equipment Recommendations  None recommended by PT    Recommendations for Other Services       Precautions / Restrictions Precautions Precautions: Fall Restrictions Weight Bearing Restrictions: No Other Position/Activity Restrictions: WBAT     Mobility  Bed Mobility Overal bed mobility: Needs Assistance Bed Mobility: Supine to Sit     Supine to sit: Min assist, Mod assist     General bed mobility comments: cues for sequence and use of R LE to self assist    Transfers Overall transfer level: Needs assistance Equipment used: Rolling walker (2 wheels) Transfers: Sit to/from Stand Sit to Stand: Min guard           General transfer comment: cues for LE management and use of UEs to self assist    Ambulation/Gait Ambulation/Gait assistance: Min guard Gait Distance (Feet): 120 Feet Assistive device: Rolling walker (2 wheels) Gait Pattern/deviations:  Step-to pattern, Decreased step length - right, Decreased step length - left, Shuffle, Trunk flexed Gait velocity: decr     General Gait Details: cues for posture, position from RW and initial sequence   Stairs             Wheelchair Mobility    Modified Rankin (Stroke Patients Only)       Balance Overall balance assessment: Mild deficits observed, not formally tested                                          Cognition Arousal/Alertness: Awake/alert Behavior During Therapy: WFL for tasks assessed/performed Overall Cognitive Status: Within Functional Limits for tasks assessed                                          Exercises Total Joint Exercises Ankle Circles/Pumps: AROM, Both, 15 reps, Supine Quad Sets: AROM, Both, 10 reps, Supine Heel Slides: AAROM, Left, 20 reps, Supine Hip ABduction/ADduction: AAROM, Left, 15 reps, Supine    General Comments        Pertinent Vitals/Pain Pain Assessment Pain Assessment: 0-10 Pain Score: 6  Pain Location: L hip Pain Descriptors / Indicators: Aching, Sore Pain Intervention(s): Limited activity within patient's tolerance, Monitored during session, Premedicated before session, Ice applied    Home Living                          Prior Function  PT Goals (current goals can now be found in the care plan section) Acute Rehab PT Goals Patient Stated Goal: regain IND PT Goal Formulation: With patient Time For Goal Achievement: 12/25/21 Potential to Achieve Goals: Good Progress towards PT goals: Progressing toward goals    Frequency    7X/week      PT Plan Current plan remains appropriate    Co-evaluation              AM-PAC PT "6 Clicks" Mobility   Outcome Measure  Help needed turning from your back to your side while in a flat bed without using bedrails?: A Little Help needed moving from lying on your back to sitting on the side of a flat bed  without using bedrails?: A Little Help needed moving to and from a bed to a chair (including a wheelchair)?: A Little Help needed standing up from a chair using your arms (e.g., wheelchair or bedside chair)?: A Little Help needed to walk in hospital room?: A Little Help needed climbing 3-5 steps with a railing? : A Lot 6 Click Score: 17    End of Session Equipment Utilized During Treatment: Gait belt Activity Tolerance: Patient tolerated treatment well Patient left: in chair;with call bell/phone within reach;with chair alarm set Nurse Communication: Mobility status PT Visit Diagnosis: Difficulty in walking, not elsewhere classified (R26.2)     Time: 6433-2951 PT Time Calculation (min) (ACUTE ONLY): 42 min  Charges:  $Gait Training: 8-22 mins $Therapeutic Exercise: 8-22 mins                     [place] Pager [phone] Office [phone]    Aamir Mclinden 12/19/2021, 8:59 AM

## 2021-12-19 NOTE — Progress Notes (Signed)
Subjective: 1 Day Post-Op Procedure(s) (LRB): LEFT TOTAL HIP ARTHROPLASTY ANTERIOR APPROACH (Left) Patient reports pain as moderate to severe. Progressing well with PT.   Objective: Vital signs in last 24 hours: Temp:  [97.8 F (36.6 C)-100 F (37.8 C)] 99.8 F (37.7 C) (07/08 0550) Pulse Rate:  [57-81] 76 (07/08 0550) Resp:  [11-18] 17 (07/08 0550) BP: (125-158)/(52-75) 150/54 (07/08 0550) SpO2:  [95 %-100 %] 98 % (07/08 0550)  Intake/Output from previous day: 07/07 0701 - 07/08 0700 In: 3027.2 [P.O.:840; I.V.:2059.8; IV Piggyback:127.4] Out: 1850 [Urine:1550; Blood:300] Intake/Output this shift: No intake/output data recorded.  Recent Labs    12/19/21 0329  HGB 10.6*   Recent Labs    12/19/21 0329  WBC 8.4  RBC 3.29*  HCT 30.4*  PLT 155   Recent Labs    12/19/21 0329  NA 137  K 3.3*  CL 106  CO2 23  BUN 20  CREATININE 0.92  GLUCOSE 150*  CALCIUM 8.2*   No results for input(s): "LABPT", "INR" in the last 72 hours.  Dorsiflexion/Plantar flexion intact Incision: dressing C/D/I Compartment soft   Assessment/Plan: 1 Day Post-Op Procedure(s) (LRB): LEFT TOTAL HIP ARTHROPLASTY ANTERIOR APPROACH (Left) Up with therapy Possible discharge to home later today if does well with PT and remains stable.       James Gallegos 12/19/2021, 8:25 AM

## 2021-12-19 NOTE — Discharge Summary (Signed)
Patient ID: James Gallegos MRN: [id number] DOB/AGE: 1951-06-14 71 y.o.  Admit date: 12/18/2021 Discharge date: 12/19/2021  Admission Diagnoses:  Principal Problem:   Unilateral primary osteoarthritis, left hip Active Problems:   Status post total replacement of left hip   Discharge Diagnoses:  Same  Past Medical History:  Diagnosis Date   Arthritis    back , R foot -   Deep vein thrombosis ([place])    after knee surgery in 2004   Diabetes mellitus without complication ([place])    Hypertension    Murmur, cardiac    pt. reports its difficult to auscultate    Sleep apnea    CPAP- last study- 2014 cpap set on 13 per pt   Wears glasses 11/20/2020   for reading    Surgeries: Procedure(s): LEFT TOTAL HIP ARTHROPLASTY ANTERIOR APPROACH on 12/18/2021   Consultants:   Discharged Condition: Improved  Hospital Course: James Gallegos is an 71 y.o. male who was admitted 12/18/2021 for operative treatment ofUnilateral primary osteoarthritis, left hip. Patient has severe unremitting pain that affects sleep, daily activities, and work/hobbies. After pre-op clearance the patient was taken to the operating room on 12/18/2021 and underwent  Procedure(s): LEFT TOTAL HIP ARTHROPLASTY ANTERIOR APPROACH.    Patient was given perioperative antibiotics:  Anti-infectives (From admission, onward)    Start     Dose/Rate Route Frequency Ordered Stop   12/18/21 1600  ceFAZolin (ANCEF) IVPB 1 g/50 mL premix        1 g 100 mL/hr over 30 Minutes Intravenous Every 6 hours 12/18/21 1445 12/18/21 2216   12/18/21 0600  ceFAZolin (ANCEF) IVPB 2g/100 mL premix        2 g 200 mL/hr over 30 Minutes Intravenous On call to O.R. 12/18/21 0532 12/18/21 0708        Patient was given sequential compression devices, early ambulation, and chemoprophylaxis to prevent DVT.  Patient benefited maximally from hospital stay and there were no complications.    Recent vital signs: Patient Vitals for the past 24 hrs:  BP  Temp Temp src Pulse Resp SpO2  12/19/21 0550 (!) 150/54 99.8 F (37.7 C) Oral 76 17 98 %  12/19/21 0119 (!) 150/52 98.7 F (37.1 C) Oral 81 17 96 %  12/18/21 2117 (!) 148/64 100 F (37.8 C) Oral 76 17 97 %  12/18/21 1733 (!) 152/62 98.8 F (37.1 C) Oral 73 18 97 %  12/18/21 1443 (!) 158/70 98.2 F (36.8 C) Oral 65 -- 95 %  12/18/21 1415 (!) 130/54 98 F (36.7 C) -- 73 13 99 %  12/18/21 1400 (!) 154/58 -- -- 75 17 99 %  12/18/21 1345 -- 98 F (36.7 C) -- -- -- --  12/18/21 1315 (!) 153/70 98 F (36.7 C) -- 67 -- 99 %  12/18/21 1300 (!) 144/66 -- -- 70 15 98 %  12/18/21 1215 (!) 150/68 -- -- 70 14 100 %  12/18/21 1130 132/60 -- -- 60 11 99 %  12/18/21 1115 132/60 97.8 F (36.6 C) -- 60 14 100 %  12/18/21 1100 -- -- -- 61 12 100 %  12/18/21 1045 -- -- -- 65 14 100 %  12/18/21 1030 -- -- -- 63 13 100 %  12/18/21 1015 (!) 151/61 -- -- 62 14 100 %  12/18/21 1000 (!) 148/75 -- -- 62 14 100 %  12/18/21 0945 (!) 142/54 -- -- (!) 57 13 99 %  12/18/21 0930 134/62 97.8 F (36.6 C) -- 61 15  100 %  12/18/21 0915 (!) 125/56 -- -- (!) 57 18 100 %  12/18/21 0900 128/61 -- -- (!) 57 18 100 %  12/18/21 0854 -- -- -- 64 17 97 %  12/18/21 0847 (!) 126/56 97.8 F (36.6 C) -- 66 12 --     Recent laboratory studies:  Recent Labs    12/19/21 0329  WBC 8.4  HGB 10.6*  HCT 30.4*  PLT 155  NA 137  K 3.3*  CL 106  CO2 23  BUN 20  CREATININE 0.92  GLUCOSE 150*  CALCIUM 8.2*     Discharge Medications:   Allergies as of 12/19/2021       Reactions   Yellow Jacket Venom Anaphylaxis        Medication List     STOP taking these medications    traMADol 50 MG tablet Commonly known as: ULTRAM       TAKE these medications    amoxicillin 500 MG capsule Commonly known as: AMOXIL Take 2,000 mg by mouth See admin instructions. For dental procedure   aspirin 81 MG chewable tablet Chew 1 tablet (81 mg total) by mouth 2 (two) times daily.   B-100 B-Complex Tabs Take 1 tablet by  mouth daily.   DHEA 50 MG Caps Take 50 mg by mouth daily.   Diindolylmethane Powd Take 250 mg by mouth daily. Capsule  "DIM"   EPINEPHrine 0.3 mg/0.3 mL Soaj injection Commonly known as: EpiPen 2-Pak Inject as needed What changed:  how much to take how to take this when to take this reasons to take this   fluticasone 50 MCG/ACT nasal spray Commonly known as: FLONASE Place 1 spray into both nostrils at bedtime as needed for allergies or rhinitis.   losartan-hydrochlorothiazide 100-25 MG tablet Commonly known as: HYZAAR Take 1 tablet by mouth daily.   Lysine HCl 500 MG Tabs Take 1,000 mg by mouth daily.   Magnesium 400 MG Tabs Take 400 mg by mouth daily at 8 pm.   methocarbamol 500 MG tablet Commonly known as: ROBAXIN Take 1 tablet (500 mg total) by mouth every 6 (six) hours as needed.   oxyCODONE 5 MG immediate release tablet Commonly known as: Roxicodone Take 1-2 tablets (5-10 mg total) by mouth every 4 (four) hours as needed for severe pain.   PRESCRIPTION MEDICATION Apply 1 application topically at bedtime. Testerone 13.5 % applied to shoulder [facility]  135 mg/ mL   QUERCETIN PO Take 1,000 mg by mouth daily.   Saw Palmetto (Serenoa repens) 320 MG Caps Take 320 mg by mouth daily.   Vitamin B-12 5000 MCG Tbdp Take 5,000 mcg by mouth daily.   vitamin C 1000 MG tablet Take 3,000 mg by mouth daily.   Vitamin D-3 125 MCG (5000 UT) Tabs Take 5,000 Units by mouth daily.   VITAMIN K2 PO Take 200 mcg by mouth daily.   zinc gluconate 50 MG tablet Take 50 mg by mouth daily.               Durable Medical Equipment  (From admission, onward)           Start     Ordered   12/18/21 1446  DME 3 n 1  Once        12/18/21 1445   12/18/21 1446  DME Walker rolling  Once       Question Answer Comment  Walker: With 5 Inch Wheels   Patient needs a walker to treat  with the following condition Status post left hip replacement       12/18/21 1445            Diagnostic Studies: DG Pelvis Portable  Result Date: 12/18/2021 CLINICAL DATA:  [id number] status post left hip replacement EXAM: PORTABLE PELVIS 1-2 VIEWS COMPARISON:  October 07, 2021 FINDINGS: There has been interval total left hip replacement. The prosthetic components are in near anatomic alignment. There is a thin linear lucency extending from the acetabulum inferiorly into the ischium. Moderate degenerative changes in the right hip joint with some narrowing of the joint space. Moderate lumbar spondylosis with mild levoconvex scoliosis of the visualized lower lumbar spine IMPRESSION: There has been interval left total hip replacement. The prosthetic components are in near anatomical alignment. Soft tissue emphysema and skin staples. Thin linear lucency extending from the acetabulum inferiorly into the ischium and is suspicious for a nondisplaced fracture line. These results will be called to the ordering clinician or representative by the Radiologist Assistant, and communication documented in the PACS or [employer]. Electronically Signed   By: Frazier Richards M.D.   On: 12/18/2021 09:44   DG HIP UNILAT WITH PELVIS 1V LEFT  Result Date: 12/18/2021 CLINICAL DATA:  LEFT hip arthroplasty EXAM: DG HIP (WITH OR WITHOUT PELVIS) 1V*L* COMPARISON:  10/07/2021 Fluoroscopy time: 0 minutes 12 seconds Dose: 1.62 mGy Images: 3 FINDINGS: New LEFT hip prosthesis identified. No fracture or dislocation. Visualized pelvis otherwise unremarkable. IMPRESSION: LEFT hip prosthesis without acute abnormalities. Electronically Signed   By: Lavonia Dana M.D.   On: 12/18/2021 08:32   DG C-Arm 1-60 Min-No Report  Result Date: 12/18/2021 Fluoroscopy was utilized by the requesting physician.  No radiographic interpretation.    Disposition: Discharge disposition: 06-Home-Health Care Svc          Follow-up Information     Mcarthur Rossetti, MD. Go on 12/31/2021.   Specialty: Orthopedic  Surgery Why: at 1:30 pm for your first post op appointment with Dr. Clarita Leber information: [place] [phone]         [facility]. Go on 12/21/2021.   Specialty: Rehabilitation Why: at 11:45 am for your first in office Outpatient Physical Therapy appointment. Contact information: [address] [place] [address] [phone]                 Signed: Erskine Emery 12/19/2021, 8:39 AM

## 2021-12-19 NOTE — TOC Transition Note (Signed)
Transition of Care [facility]) - CM/SW Discharge Note   Patient Details  Name: QUINLIN CONANT MRN: [id number] Date of Birth: Aug 31, 1950  [facility] CM/SW Contact:  Ross Ludwig, LCSW Phone Number: 12/19/2021, 10:19 AM   Clinical Narrative:     Outpatient PT prearranged at physician's office, Ross signing off.         Patient Goals and CMS Choice        Discharge Placement                       Discharge Plan and Services                DME Arranged:  (Patient has all DME he reports at home (RW, 3in1/BSC))         HH Arranged:  (Patient requested to go straight to OPPT- already set up)          Social Determinants of Health (SDOH) Interventions     Readmission Risk Interventions     No data to display

## 2021-12-19 NOTE — Discharge Instructions (Signed)

## 2021-12-19 NOTE — Plan of Care (Signed)
  Problem: Education: Goal: Knowledge of General Education information will improve Description: Including pain rating scale, medication(s)/side effects and non-pharmacologic comfort measures Outcome: Progressing   Problem: Education: Goal: Knowledge of the prescribed therapeutic regimen will improve Outcome: Progressing   Problem: Activity: Goal: Ability to avoid complications of mobility impairment will improve Outcome: Progressing   Problem: Clinical Measurements: Goal: Postoperative complications will be avoided or minimized Outcome: Progressing   Problem: Pain Management: Goal: Pain level will decrease with appropriate interventions Outcome: Progressing

## 2021-12-19 NOTE — Progress Notes (Signed)
Physical Therapy Treatment Patient Details Name: James Gallegos MRN: [id number] DOB: 06-11-1951 Today's Date: 12/19/2021   History of Present Illness Pt s/p L THR and with hx of DM, Bil TKR and cervical fusion    PT Comments    Pt continues motivated and progressing steadily with mobility including negotiating stairs and reviewing car transfers.  Pt hopeful for dc home tomorrow am.   Recommendations for follow up therapy are one component of a multi-disciplinary discharge planning process, led by the attending physician.  Recommendations may be updated based on patient status, additional functional criteria and insurance authorization.  Follow Up Recommendations  Follow physician's recommendations for discharge plan and follow up therapies     Assistance Recommended at Discharge Intermittent Supervision/Assistance  Patient can return home with the following A little help with walking and/or transfers;A little help with bathing/dressing/bathroom;Assistance with cooking/housework;Help with stairs or ramp for entrance;Assist for transportation   Equipment Recommendations  None recommended by PT    Recommendations for Other Services       Precautions / Restrictions Precautions Precautions: Fall Restrictions Weight Bearing Restrictions: No Other Position/Activity Restrictions: WBAT     Mobility  Bed Mobility Overal bed mobility: Needs Assistance Bed Mobility: Supine to Sit, Sit to Supine     Supine to sit: Min assist, Mod assist Sit to supine: Min assist   General bed mobility comments: cues for sequence and use of R LE to self assist    Transfers Overall transfer level: Needs assistance Equipment used: Rolling walker (2 wheels) Transfers: Sit to/from Stand Sit to Stand: Min guard           General transfer comment: cues for LE management and use of UEs to self assist    Ambulation/Gait Ambulation/Gait assistance: Min guard Gait Distance (Feet): 100  Feet Assistive device: Rolling walker (2 wheels) Gait Pattern/deviations: Step-to pattern, Decreased step length - right, Decreased step length - left, Shuffle, Trunk flexed Gait velocity: decr     General Gait Details: cues for posture, position from RW and initial sequence   Stairs Stairs: Yes Stairs assistance: Min assist Stair Management: One rail Left, Step to pattern, Forwards, With crutches Number of Stairs: 5 General stair comments: cues for sequence and foot/crutch placement   Wheelchair Mobility    Modified Rankin (Stroke Patients Only)       Balance Overall balance assessment: Mild deficits observed, not formally tested                                          Cognition Arousal/Alertness: Awake/alert Behavior During Therapy: WFL for tasks assessed/performed Overall Cognitive Status: Within Functional Limits for tasks assessed                                          Exercises Total Joint Exercises Ankle Circles/Pumps: AROM, Both, 15 reps, Supine Quad Sets: AROM, Both, 10 reps, Supine Heel Slides: AAROM, Left, 20 reps, Supine Hip ABduction/ADduction: AAROM, Left, 15 reps, Supine    General Comments        Pertinent Vitals/Pain Pain Assessment Pain Assessment: 0-10 Pain Score: 6  Pain Location: L hip Pain Descriptors / Indicators: Aching, Sore Pain Intervention(s): Limited activity within patient's tolerance    Home Living  Prior Function            PT Goals (current goals can now be found in the care plan section) Acute Rehab PT Goals Patient Stated Goal: regain IND PT Goal Formulation: With patient Time For Goal Achievement: 12/25/21 Potential to Achieve Goals: Good Progress towards PT goals: Progressing toward goals    Frequency    7X/week      PT Plan Current plan remains appropriate    Co-evaluation              AM-PAC PT "6 Clicks" Mobility    Outcome Measure  Help needed turning from your back to your side while in a flat bed without using bedrails?: A Little Help needed moving from lying on your back to sitting on the side of a flat bed without using bedrails?: A Little Help needed moving to and from a bed to a chair (including a wheelchair)?: A Little Help needed standing up from a chair using your arms (e.g., wheelchair or bedside chair)?: A Little Help needed to walk in hospital room?: A Little Help needed climbing 3-5 steps with a railing? : A Little 6 Click Score: 18    End of Session Equipment Utilized During Treatment: Gait belt Activity Tolerance: Patient tolerated treatment well Patient left: in bed;with call bell/phone within reach;with chair alarm set Nurse Communication: Mobility status PT Visit Diagnosis: Difficulty in walking, not elsewhere classified (R26.2)     Time: 1115-1140 PT Time Calculation (min) (ACUTE ONLY): 25 min  Charges:  $Gait Training: 8-22 mins $Therapeutic Activity: 8-22 mins                     Debe Coder PT Acute Rehabilitation Services Pager [phone] Office [phone]    James Gallegos 12/19/2021, 1:05 PM

## 2021-12-20 DIAGNOSIS — M1612 Unilateral primary osteoarthritis, left hip: Secondary | ICD-10-CM | POA: Diagnosis not present

## 2021-12-20 LAB — CBC
HCT: 30 % — ABNORMAL LOW (ref 39.0–52.0)
Hemoglobin: 10.2 g/dL — ABNORMAL LOW (ref 13.0–17.0)
MCH: 31.8 pg (ref 26.0–34.0)
MCHC: 34 g/dL (ref 30.0–36.0)
MCV: 93.5 fL (ref 80.0–100.0)
Platelets: 155 10*3/uL (ref 150–400)
RBC: 3.21 MIL/uL — ABNORMAL LOW (ref 4.22–5.81)
RDW: 12.9 % (ref 11.5–15.5)
WBC: 8 10*3/uL (ref 4.0–10.5)
nRBC: 0 % (ref 0.0–0.2)

## 2021-12-20 NOTE — Plan of Care (Signed)
?  Problem: Activity: ?Goal: Risk for activity intolerance will decrease ?Outcome: Progressing ?  ?Problem: Safety: ?Goal: Ability to remain free from injury will improve ?Outcome: Progressing ?  ?Problem: Pain Managment: ?Goal: General experience of comfort will improve ?Outcome: Progressing ?  ?

## 2021-12-20 NOTE — Progress Notes (Signed)
Patient ID: James Gallegos, male   DOB: 04/27/1951, 71 y.o.   MRN: [id number] The patient is awake and alert this morning.  He really needed to stay yesterday to maximize his mobility with physical therapy.  He feels much better today.  His pain is better controlled.  His vital signs are stable.  Physical therapy will work with him today with a goal of discharge to home this afternoon.  His left operative hip is stable.

## 2021-12-20 NOTE — Plan of Care (Signed)
No acute events overnight. Problem: Education: Goal: Knowledge of General Education information will improve Description: Including pain rating scale, medication(s)/side effects and non-pharmacologic comfort measures Outcome: Progressing   Problem: Health Behavior/Discharge Planning: Goal: Ability to manage health-related needs will improve Outcome: Progressing   Problem: Clinical Measurements: Goal: Ability to maintain clinical measurements within normal limits will improve Outcome: Progressing Goal: Will remain free from infection Outcome: Progressing Goal: Diagnostic test results will improve Outcome: Progressing Goal: Respiratory complications will improve Outcome: Progressing Goal: Cardiovascular complication will be avoided Outcome: Progressing   Problem: Activity: Goal: Risk for activity intolerance will decrease Outcome: Progressing   Problem: Nutrition: Goal: Adequate nutrition will be maintained Outcome: Progressing   Problem: Coping: Goal: Level of anxiety will decrease Outcome: Progressing   Problem: Elimination: Goal: Will not experience complications related to bowel motility Outcome: Progressing Goal: Will not experience complications related to urinary retention Outcome: Progressing   Problem: Pain Managment: Goal: General experience of comfort will improve Outcome: Progressing   Problem: Safety: Goal: Ability to remain free from injury will improve Outcome: Progressing   Problem: Skin Integrity: Goal: Risk for impaired skin integrity will decrease Outcome: Progressing   Problem: Education: Goal: Knowledge of the prescribed therapeutic regimen will improve Outcome: Progressing Goal: Understanding of discharge needs will improve Outcome: Progressing Goal: Individualized Educational Video(s) Outcome: Progressing   Problem: Activity: Goal: Ability to avoid complications of mobility impairment will improve Outcome: Progressing Goal: Ability  to tolerate increased activity will improve Outcome: Progressing   Problem: Clinical Measurements: Goal: Postoperative complications will be avoided or minimized Outcome: Progressing   Problem: Pain Management: Goal: Pain level will decrease with appropriate interventions Outcome: Progressing   Problem: Skin Integrity: Goal: Will show signs of wound healing Outcome: Progressing

## 2021-12-20 NOTE — Progress Notes (Signed)
Discharge instructions given to patient and no questions asked.

## 2021-12-20 NOTE — Progress Notes (Signed)
Physical Therapy Treatment Patient Details Name: James Gallegos MRN: [id number] DOB: 1951/01/14 Today's Date: 12/20/2021   History of Present Illness Pt s/p L THR and with hx of DM, Bil TKR and cervical fusion    PT Comments    Pt in good spirits and with marked improvement in pain control.  Pt up to ambulate in hall, negotiated stairs and performed therex program.  Pt feeling much more comfortable with dc home this date.   Recommendations for follow up therapy are one component of a multi-disciplinary discharge planning process, led by the attending physician.  Recommendations may be updated based on patient status, additional functional criteria and insurance authorization.  Follow Up Recommendations  Follow physician's recommendations for discharge plan and follow up therapies     Assistance Recommended at Discharge Intermittent Supervision/Assistance  Patient can return home with the following A little help with walking and/or transfers;A little help with bathing/dressing/bathroom;Assistance with cooking/housework;Help with stairs or ramp for entrance;Assist for transportation   Equipment Recommendations  None recommended by PT    Recommendations for Other Services       Precautions / Restrictions Precautions Precautions: Fall Restrictions Weight Bearing Restrictions: No Other Position/Activity Restrictions: WBAT     Mobility  Bed Mobility Overal bed mobility: Needs Assistance Bed Mobility: Supine to Sit     Supine to sit: Min assist     General bed mobility comments: cues for sequence and use of R LE to self assist; min assist with L LE only    Transfers Overall transfer level: Needs assistance Equipment used: Rolling walker (2 wheels) Transfers: Sit to/from Stand Sit to Stand: Min guard, Supervision           General transfer comment: min cues for LE management and use of UEs to self assist    Ambulation/Gait Ambulation/Gait assistance: Min guard,  Supervision Gait Distance (Feet): 150 Feet Assistive device: Rolling walker (2 wheels) Gait Pattern/deviations: Decreased step length - right, Decreased step length - left, Shuffle, Trunk flexed, Step-to pattern, Step-through pattern Gait velocity: decr     General Gait Details: cues for posture, position from RW and initial sequence   Stairs Stairs: Yes Stairs assistance: Min assist Stair Management: One rail Left, Step to pattern, Forwards, With crutches, No rails, Backwards Number of Stairs: 4 General stair comments: 2 steps with cane and rail with cues for sequence and foot/cane placement; single step twice bkwd with RW to simulate stool into high bed at home   Wheelchair Mobility    Modified Rankin (Stroke Patients Only)       Balance Overall balance assessment: Mild deficits observed, not formally tested                                          Cognition Arousal/Alertness: Awake/alert Behavior During Therapy: WFL for tasks assessed/performed Overall Cognitive Status: Within Functional Limits for tasks assessed                                          Exercises Total Joint Exercises Ankle Circles/Pumps: AROM, Both, 15 reps, Supine Quad Sets: AROM, Both, 10 reps, Supine Heel Slides: AAROM, Left, 20 reps, Supine Hip ABduction/ADduction: AAROM, Left, 15 reps, Supine    General Comments        Pertinent Vitals/Pain Pain  Assessment Pain Assessment: 0-10 Pain Score: 4  Pain Location: L hip Pain Descriptors / Indicators: Aching, Sore Pain Intervention(s): Limited activity within patient's tolerance, Monitored during session, Premedicated before session, Ice applied    Home Living                          Prior Function            PT Goals (current goals can now be found in the care plan section) Acute Rehab PT Goals Patient Stated Goal: regain IND PT Goal Formulation: With patient Time For Goal Achievement:  12/25/21 Potential to Achieve Goals: Good Progress towards PT goals: Progressing toward goals    Frequency    7X/week      PT Plan Current plan remains appropriate    Co-evaluation              AM-PAC PT "6 Clicks" Mobility   Outcome Measure  Help needed turning from your back to your side while in a flat bed without using bedrails?: A Little Help needed moving from lying on your back to sitting on the side of a flat bed without using bedrails?: A Little Help needed moving to and from a bed to a chair (including a wheelchair)?: A Little Help needed standing up from a chair using your arms (e.g., wheelchair or bedside chair)?: A Little Help needed to walk in hospital room?: A Little Help needed climbing 3-5 steps with a railing? : A Little 6 Click Score: 18    End of Session Equipment Utilized During Treatment: Gait belt Activity Tolerance: Patient tolerated treatment well Patient left: in chair;with call bell/phone within reach;with chair alarm set Nurse Communication: Mobility status PT Visit Diagnosis: Difficulty in walking, not elsewhere classified (R26.2)     Time: 4888-9169 PT Time Calculation (min) (ACUTE ONLY): 27 min  Charges:  $Gait Training: 8-22 mins $Therapeutic Exercise: 8-22 mins                     Debe Coder PT Acute Rehabilitation Services Pager [phone] Office [phone]    [name] 12/20/2021, 12:25 PM

## 2021-12-21 ENCOUNTER — Telehealth: Payer: Self-pay | Admitting: *Deleted

## 2021-12-21 ENCOUNTER — Other Ambulatory Visit: Payer: Self-pay | Admitting: Orthopaedic Surgery

## 2021-12-21 ENCOUNTER — Other Ambulatory Visit: Payer: Self-pay

## 2021-12-21 ENCOUNTER — Encounter: Payer: Self-pay | Admitting: Physical Therapy

## 2021-12-21 ENCOUNTER — Other Ambulatory Visit (HOSPITAL_COMMUNITY): Payer: Self-pay

## 2021-12-21 ENCOUNTER — Ambulatory Visit: Payer: PPO | Admitting: Physical Therapy

## 2021-12-21 DIAGNOSIS — M6281 Muscle weakness (generalized): Secondary | ICD-10-CM

## 2021-12-21 DIAGNOSIS — M25652 Stiffness of left hip, not elsewhere classified: Secondary | ICD-10-CM | POA: Diagnosis not present

## 2021-12-21 DIAGNOSIS — R2681 Unsteadiness on feet: Secondary | ICD-10-CM | POA: Diagnosis not present

## 2021-12-21 DIAGNOSIS — M25552 Pain in left hip: Secondary | ICD-10-CM

## 2021-12-21 DIAGNOSIS — R2689 Other abnormalities of gait and mobility: Secondary | ICD-10-CM | POA: Diagnosis not present

## 2021-12-21 MED ORDER — OXYCODONE HCL 5 MG PO TABS
5.0000 mg | ORAL_TABLET | ORAL | 0 refills | Status: DC | PRN
  Filled 2021-12-21: qty 30, 3d supply, fill #0

## 2021-12-21 NOTE — Telephone Encounter (Signed)
Ortho bundle D/C in person meeting completed today.

## 2021-12-21 NOTE — Therapy (Signed)
OUTPATIENT PHYSICAL THERAPY LOWER EXTREMITY EVALUATION   Patient Name: James Gallegos MRN: [id number] DOB:02-Nov-1950, 71 y.o., male Today's Date: 12/21/2021   PT End of Session - 12/21/21 1408     Visit Number 1    Number of Visits 20    Date for PT Re-Evaluation 02/12/22    Authorization Type [employer]    Authorization Time Period $15 Co-pay    Progress Note Due on Visit 10    PT Start Time 1155    PT Stop Time 1231    PT Time Calculation (min) 36 min    Activity Tolerance Patient tolerated treatment well;Patient limited by pain;Patient limited by fatigue    Behavior During Therapy [facility] for tasks assessed/performed             Past Medical History:  Diagnosis Date   Arthritis    back , R foot -   Deep vein thrombosis ([place])    after knee surgery in 2004   Diabetes mellitus without complication ([place])    Hypertension    Murmur, cardiac    pt. reports its difficult to auscultate    Sleep apnea    CPAP- last study- 2014 cpap set on 13 per pt   Wears glasses 11/20/2020   for reading   Past Surgical History:  Procedure Laterality Date   ANTERIOR CERVICAL DECOMP/DISCECTOMY FUSION N/A 04/14/2020   Procedure: ANTERIOR CERVICAL DECOMPRESSION/DISCECTOMY FUSION, INTERBODY PROSTHESIS, PLATE/SCREWS CERVICAL THREE-FOUR,CERVICAL FOUR-FIVE,CERVICAL SIX-SEVEN;  Surgeon: Newman Pies, MD;  Location: [place];  Service: Neurosurgery;  Laterality: N/A;  anterior   CERVICAL SPINE SURGERY N/A 1990   C5-6 discectomy plates and screws in neck per pt   colonscopy  2017   polyps removed per pt follow up in 5 years   FINGER SURGERY Right 2008   x 3 index finger   KNEE ARTHROSCOPY Bilateral    L 2004, R 2005   MASS EXCISION Right 01/18/2017   Procedure: EXCISION BENIGN RIGHT NECK MASS;  Surgeon: Rozetta Nunnery, MD;  Location: [place];  Service: ENT;  Laterality: Right;   MASS EXCISION Bilateral 03/23/2019   Procedure: EXCISIONS OF RIGHT CHEEK LESION,  RIGHT POSTERIOR NECK LIPOMA  AND LEFT NECK NODULE;  Surgeon: Rozetta Nunnery, MD;  Location: [place];  Service: ENT;  Laterality: Bilateral;   [place] Right 06/01/2016   Procedure: TOTAL KNEE ARTHROPLASTY;  Surgeon: [name], MD;  Location: [place];  Service: Orthopedics;  Laterality: Right;   TOTAL KNEE ARTHROPLASTY Left 07/25/2018   Procedure: LEFT TOTAL KNEE ARTHROPLASTY;  Surgeon: [name], MD;  Location: [place];  Service: Orthopedics;  Laterality: Left;   ULNAR NERVE TRANSPOSITION Left 11/25/2020   Procedure: ulnar nerve decompression left elbow;  Surgeon: [name], MD;  Location: [place];  Service: Orthopedics;  Laterality: Left;   Patient Active Problem List   Diagnosis Date Noted   Status post total replacement of left hip 12/18/2021   Unilateral primary osteoarthritis, left hip 11/03/2021   Pain in left hip 09/03/2021   Neuropathy, 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   Cervical spondylosis with myelopathy and radiculopathy 04/14/2020   Impingement syndrome of right shoulder 10/10/2019   Low back pain 10/10/2019   Irritation of ulnar nerve, left 08/28/2019   Class 2 obesity due to excess calories without serious comorbidity with body mass index (BMI) of 39.0 to 39.9 in adult 01/08/2019   Pain in right elbow  12/06/2018   Primary osteoarthritis of left knee 07/25/2018   History of left knee replacement 07/25/2018   Right foot pain 06/22/2018   Acute left-sided low back pain with left-sided sciatica 06/22/2018   Osteoarthritis 11/21/2017   Hyperlipidemia 11/21/2017   Low testosterone 09/26/2016   Healthcare maintenance 09/26/2016   S/P total knee replacement using cement, right 06/01/2016   Sleep apnea 05/19/2016   Hypertension 05/19/2016    PCP: Eunice Blase, MD  REFERRING PROVIDER: Mcarthur Rossetti, MD  REFERRING DIAG: [phone] (ICD-10-CM) - Unilateral primary osteoarthritis, left hip &  M25.552 (ICD-10-CM) - Pain in left hip  THERAPY DIAG:  Muscle weakness (generalized)  Stiffness of left hip, not elsewhere classified  Pain in left hip  Other abnormalities of gait and mobility  Unsteadiness on feet  Rationale for Evaluation and Treatment Rehabilitation  ONSET DATE: 12/18/2021 left THA  SUBJECTIVE:   SUBJECTIVE STATEMENT: He had left hip replacement anterior approach on 12/18/2021.  He left hospital on 12/20/21.  No issues since home.  It is painful starting to move.  "My biggest problem is lifting that leg.  My wife helps me pull it out of bed."  PERTINENT HISTORY: OA, neuropathy, cervical spondylosis, LBP with left side sciatica, obesity, Lt '20 & Rt '17 TKA, HTN, DVT after knee sg, cardiac murmur,   PAIN:  Are you having pain? Yes: NPRS scale: 4-5/10 this morning and since surgery lowest 4/10 and highest 8/10 Pain location: left hip laterally Pain description: ache Aggravating factors: trying to move LE Relieving factors: not moving my leg  PRECAUTIONS: Anterior hip  WEIGHT BEARING RESTRICTIONS Yes LLE WBAT  FALLS:  Has patient fallen in last 6 months? 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   OBJECTIVE:   DIAGNOSTIC FINDINGS: 12/18/2021  There has been interval left total hip replacement. The prosthetic components are in near anatomical alignment. Soft tissue emphysema and skin staples. Thin linear lucency extending from the acetabulum inferiorly into the ischium and is suspicious for a nondisplaced  fracture line.  PATIENT SURVEYS:  FOTO 12/21/21 40% functional, target 61%  COGNITION: Overall cognitive status: Within functional limits for tasks assessed     SENSATION: Light touch: WFL  POSTURE: rounded shoulders, forward head, anterior pelvic tilt, and weight shift right  PALPATION: Tender to palpation laterally and anterior of incision to greater trochanter Tightness on lateral hip to mid thigh  LOWER EXTREMITY ROM:  ROM P:passive  A:active Right eval Left eval  Hip flexion    Hip extension  Supine P: -19*  Hip abduction    Hip adduction    Hip internal rotation    Hip external rotation    Knee flexion    Knee extension    Ankle dorsiflexion    Ankle plantarflexion    Ankle inversion    Ankle eversion     (Blank rows = not tested)  LOWER EXTREMITY MMT:  MMT Right eval Left eval  Hip flexion  2+/5  Hip extension  Standing & functional 3-/5  Hip abduction  Standing & functional 3-/5  Hip adduction  Hip internal rotation    Hip external rotation    Knee flexion    Knee extension    Ankle dorsiflexion    Ankle plantarflexion    Ankle inversion    Ankle eversion     (Blank rows = not tested)   FUNCTIONAL TESTS:  Sit to & from stand requires use of BUEs on armrests with limited hip flexion and RW to stabalize upon arising.  Pt requires mod to max A for LLE in & out of bed.   GAIT: Distance walked: 139f Assistive device utilized: WEnvironmental consultant- 2 wheeled Level of assistance; verbal cues (supervision) Comments: stiff gait with reduced L hip extension, hip hiking & vaulting to clear LLE, antalgic gait with decreased stance LLE,    TODAY'S TREATMENT: Therapeutic Exercise: -HEP below was instructed and demonstrated, pt did 1 set of each exercise and verbalized understanding   PATIENT EDUCATION:  Education details: Access Code: [email or web address] Person educated: Patient Education method: Explanation, Demonstration, Tactile cues, and Verbal cues Education  comprehension: verbalized understanding, returned demonstration, verbal cues required, and tactile cues required   HOME EXERCISE PROGRAM: Access Code: [email or web address] URL: [email or web address] Date: 12/21/2021 Prepared by: RJamey Reas Exercises - Supine Heel Slides  - 3 x daily - 7 x weekly - 2-3 sets - 10 reps - Hooklying Gluteal Sets  - 3 x daily - 7 x weekly - 2-3 sets - 10 reps - 5 seconds hold - Supine March  - 3 x daily - 7 x weekly - 2-3 sets - 10 reps - 5 seconds hold - Supine Hip Abduction  - 3 x daily - 7 x weekly - 2-3 sets - 10 reps - Standing hip abduction alternating legs  - 3 x daily - 7 x weekly - 2-3 sets - 10 reps - Standing hip extension alternating legs  - 3 x daily - 7 x weekly - 2-3 sets - 10 reps - Standing Marching  - 3 x daily - 7 x weekly - 2-3 sets - 10 reps - 5 seconds hold - Sit to Stand  - 3 x daily - 7 x weekly - 2-3 sets - 10 reps  ASSESSMENT:  CLINICAL IMPRESSION: Patient is a 71y.o. male who was seen today for physical therapy evaluation and treatment for a L THA 7/723. He is limited by pain, fatigue, weakness, and stiffness in his left hip. Patient will benefit from skilled physical therapy to address strength, endurance, and balance deficits to improve functional limitations and achieve patient goals.  OBJECTIVE IMPAIRMENTS Abnormal gait, decreased activity tolerance, decreased balance, decreased endurance, decreased knowledge of use of DME, decreased mobility, decreased ROM, decreased strength, increased edema, impaired flexibility, postural dysfunction, and pain.   ACTIVITY LIMITATIONS lifting, bending, sitting, standing, squatting, sleeping, stairs, transfers, bed mobility, and locomotion level  PARTICIPATION LIMITATIONS: driving, community activity, occupation, and yard work  PERSONAL FACTORS Fitness and 1-2 comorbidities: see pertinent history above  are also affecting patient's functional outcome.   REHAB POTENTIAL:  Good  CLINICAL DECISION MAKING: Stable/uncomplicated  EVALUATION COMPLEXITY: Low   GOALS: Goals reviewed with patient? Yes  SHORT TERM GOALS: Target date: 01/15/2022  Patient independent and verbalizes compliance with initial HEP. Baseline: SEE OBJECTIVE DATA Goal status: INITIAL  2.  Patient reports 50% improvement in left hip pain. Baseline: SEE OBJECTIVE DATA Goal status: INITIAL  3.  Patient is able to lift LLE for all bed mobility without manual assistance Baseline: see objective data Goal status: INITIAL   LONG TERM  GOALS: Target date: 02/12/2022   Patient will improve FOTO score to 61% Baseline: SEE OBJECTIVE DATA Goal status: INITIAL  2.  Patient reports L hip pain </= 2/10 with standing, sitting, and gait activities. Baseline: SEE OBJECTIVE DATA Goal status: INITIAL  3.  Left hip AROM standing within 10* of RLE.  Baseline: SEE OBJECTIVE DATA Goal status: INITIAL  4.  Left hip PROM within 10* of RLE.  Baseline: SEE OBJECTIVE DATA Goal status: INITIAL  5.  Patient ambulates >500' community distances including negotiating ramps, curbs without device & stairs single rail independently. Baseline: SEE OBJECTIVE DATA Goal status: INITIAL  6.  Patient verbalizes and demonstrates ability to sit and stand for an 8 hour day to return to work without increases in pain. Baseline: SEE OBJECTIVE DATA Goal status: INITIAL   PLAN: PT FREQUENCY:  3x /week for 4 weeks, then 2x /week for 4 weeks  PT DURATION: 8 weeks  PLANNED INTERVENTIONS: Therapeutic exercises, Therapeutic activity, Neuromuscular re-education, Balance training, Gait training, Patient/Family education, Joint mobilization, Stair training, DME instructions, Electrical stimulation, Cryotherapy, Moist heat, scar mobilization, Taping, Vasopneumatic device, Manual therapy, and physical performance testing  PLAN FOR NEXT SESSION: Check HEP, Nustep, stretching and manual techniques for range  Jana Hakim,  SPT 12/21/2021, 3:00 PM  This entire session of physical therapy was performed under the direct supervision of PT signing evaluation /treatment. PT reviewed note and agrees.   Jamey Reas, PT, DPT 12/21/2021, 5:04 PM

## 2021-12-22 ENCOUNTER — Encounter: Payer: Self-pay | Admitting: Physical Therapy

## 2021-12-22 ENCOUNTER — Ambulatory Visit (INDEPENDENT_AMBULATORY_CARE_PROVIDER_SITE_OTHER): Payer: PPO | Admitting: Physical Therapy

## 2021-12-22 DIAGNOSIS — M25652 Stiffness of left hip, not elsewhere classified: Secondary | ICD-10-CM | POA: Diagnosis not present

## 2021-12-22 DIAGNOSIS — M6281 Muscle weakness (generalized): Secondary | ICD-10-CM | POA: Diagnosis not present

## 2021-12-22 DIAGNOSIS — R2681 Unsteadiness on feet: Secondary | ICD-10-CM

## 2021-12-22 DIAGNOSIS — M25552 Pain in left hip: Secondary | ICD-10-CM

## 2021-12-22 DIAGNOSIS — R2689 Other abnormalities of gait and mobility: Secondary | ICD-10-CM

## 2021-12-22 NOTE — Therapy (Signed)
OUTPATIENT PHYSICAL THERAPY TREATMENT NOTE   Patient Name: James Gallegos MRN: [id number] DOB:1951-01-21, 71 y.o., male Today's Date: 12/22/2021  PCP: Eunice Blase, MD REFERRING PROVIDER: Mcarthur Rossetti, MD  END OF SESSION:   PT End of Session - 12/22/21 1025     Visit Number 2    Number of Visits 20    Date for PT Re-Evaluation 02/12/22    Authorization Type [employer]    Authorization Time Period $15 Co-pay    Progress Note Due on Visit 10    PT Start Time 1023    PT Stop Time 1059    PT Time Calculation (min) 36 min    Activity Tolerance Patient tolerated treatment well;Patient limited by pain;Patient limited by fatigue    Behavior During Therapy [facility] for tasks assessed/performed             Past Medical History:  Diagnosis Date   Arthritis    back , R foot -   Deep vein thrombosis ([place])    after knee surgery in 2004   Diabetes mellitus without complication ([place])    Hypertension    Murmur, cardiac    pt. reports its difficult to auscultate    Sleep apnea    CPAP- last study- 2014 cpap set on 13 per pt   Wears glasses 11/20/2020   for reading   Past Surgical History:  Procedure Laterality Date   ANTERIOR CERVICAL DECOMP/DISCECTOMY FUSION N/A 04/14/2020   Procedure: ANTERIOR CERVICAL DECOMPRESSION/DISCECTOMY FUSION, INTERBODY PROSTHESIS, PLATE/SCREWS CERVICAL THREE-FOUR,CERVICAL FOUR-FIVE,CERVICAL SIX-SEVEN;  Surgeon: Newman Pies, MD;  Location: [place];  Service: Neurosurgery;  Laterality: N/A;  anterior   CERVICAL SPINE SURGERY N/A 1990   C5-6 discectomy plates and screws in neck per pt   colonscopy  2017   polyps removed per pt follow up in 5 years   FINGER SURGERY Right 2008   x 3 index finger   KNEE ARTHROSCOPY Bilateral    L 2004, R 2005   MASS EXCISION Right 01/18/2017   Procedure: EXCISION BENIGN RIGHT NECK MASS;  Surgeon: Rozetta Nunnery, MD;  Location: [place];  Service: ENT;  Laterality: Right;    MASS EXCISION Bilateral 03/23/2019   Procedure: EXCISIONS OF RIGHT CHEEK LESION, RIGHT POSTERIOR NECK LIPOMA  AND LEFT NECK NODULE;  Surgeon: Rozetta Nunnery, MD;  Location: [place];  Service: ENT;  Laterality: Bilateral;   [place] Right 06/01/2016   Procedure: TOTAL KNEE ARTHROPLASTY;  Surgeon: [name], MD;  Location: [place];  Service: Orthopedics;  Laterality: Right;   TOTAL KNEE ARTHROPLASTY Left 07/25/2018   Procedure: LEFT TOTAL KNEE ARTHROPLASTY;  Surgeon: [name], MD;  Location: [place];  Service: Orthopedics;  Laterality: Left;   ULNAR NERVE TRANSPOSITION Left 11/25/2020   Procedure: ulnar nerve decompression left elbow;  Surgeon: [name], MD;  Location: [place];  Service: Orthopedics;  Laterality: Left;   Patient Active Problem List   Diagnosis Date Noted   Status post total replacement of left hip 12/18/2021   Unilateral primary osteoarthritis, left hip 11/03/2021   Pain in left hip 09/03/2021   Neuropathy, 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   Cervical spondylosis with myelopathy and radiculopathy 04/14/2020   Impingement syndrome of right shoulder 10/10/2019   Low back pain 10/10/2019   Irritation of ulnar nerve, left 08/28/2019   Class 2 obesity due to excess calories without serious comorbidity with body mass index (  BMI) of 39.0 to 39.9 in adult 01/08/2019   Pain in right elbow 12/06/2018   Primary osteoarthritis of left knee 07/25/2018   History of left knee replacement 07/25/2018   Right foot pain 06/22/2018   Acute left-sided low back pain with left-sided sciatica 06/22/2018   Osteoarthritis 11/21/2017   Hyperlipidemia 11/21/2017   Low testosterone 09/26/2016   Healthcare maintenance 09/26/2016   S/P total knee replacement using cement, right 06/01/2016   Sleep apnea 05/19/2016   Hypertension 05/19/2016    REFERRING DIAG: M16.12 (ICD-10-CM) - Unilateral primary osteoarthritis, left  hip & M25.552 (ICD-10-CM) - Pain in left hip  THERAPY DIAG:  Muscle weakness (generalized)  Stiffness of left hip, not elsewhere classified  Pain in left hip  Other abnormalities of gait and mobility  Unsteadiness on feet  Rationale for Evaluation and Treatment Rehabilitation  PERTINENT HISTORY: OA, neuropathy, cervical spondylosis, LBP with left side sciatica, obesity, Lt '20 & Rt '17 TKA, HTN, DVT after knee sg, cardiac murmur,   PRECAUTIONS: Anterior hip  SUBJECTIVE: He reports doing okay after eval without spikes in pain and he slept well, needing help to get into bed. He took pain medication before coming to PT today. He tried the standing abduction and extension exercises at home, noting no issues with those.  PAIN:  Are you having pain? Yes: NPRS scale: 4/10 Pain location: lateral left hip around incision Pain description: ache Aggravating factors: trying to move LE Relieving factors: not moving my leg   OBJECTIVE: (objective measures completed at initial evaluation unless otherwise dated) OBJECTIVE:    DIAGNOSTIC FINDINGS: 12/18/2021  There has been interval left total hip replacement. The prosthetic components are in near anatomical alignment. Soft tissue emphysema and skin staples. Thin linear lucency extending from the acetabulum inferiorly into the ischium and is suspicious for a nondisplaced fracture line.   PATIENT SURVEYS:  FOTO 12/21/21 40% functional, target 61%   COGNITION: Overall cognitive status: Within functional limits for tasks assessed                          SENSATION: Light touch: WFL   POSTURE: rounded shoulders, forward head, anterior pelvic tilt, and weight shift right   PALPATION: Tender to palpation laterally and anterior of incision to greater trochanter Tightness on lateral hip to mid thigh   LOWER EXTREMITY ROM:   ROM P:passive  A:active Right eval Left eval  Hip flexion      Hip extension   Supine P: -19*  Hip abduction       Hip adduction      Hip internal rotation      Hip external rotation      Knee flexion      Knee extension      Ankle dorsiflexion      Ankle plantarflexion      Ankle inversion      Ankle eversion       (Blank rows = not tested)   LOWER EXTREMITY MMT:   MMT Right eval Left eval  Hip flexion   2+/5  Hip extension   Standing & functional 3-/5  Hip abduction   Standing & functional 3-/5  Hip adduction      Hip internal rotation      Hip external rotation      Knee flexion      Knee extension      Ankle dorsiflexion      Ankle plantarflexion  Ankle inversion      Ankle eversion       (Blank rows = not tested)     FUNCTIONAL TESTS:  Sit to & from stand requires use of BUEs on armrests with limited hip flexion and RW to stabalize upon arising.  Pt requires mod to max A for LLE in & out of bed.    GAIT: Distance walked: 144f Assistive device utilized: WEnvironmental consultant- 2 wheeled Level of assistance; verbal cues (supervision) Comments: stiff gait with reduced L hip extension, hip hiking & vaulting to clear LLE, antalgic gait with decreased stance LLE     TODAY'S TREATMENT: 12/22/2021 Therapeutic Exercise: -Nustep seat 9 BUEs/BLEs for 8 min -Seated hamstring stretch with band LLE 2x 30 sec -Seated heel slides on pillowcase LLE 1x 30s PT reviewed HEP using HO that was issued today. He verbalized understanding.   Manual: -supine Hip flexion, extension, abduction, adduction & quad stretch PROM with PT manual assistance 5-10 second hold at stretch  Therapeutic Activity: PT recommended weight shifting onto LLE for 3-5 sec 5 reps upon arising prior to initiating gait. Pt return demo reporting less discomfort with gait. Pt amb with RW with improved weight bearing in stance & flexion for swing today compared to eval yesterday. He still requires BUE support on RW.    12/21/2021 Therapeutic Exercise: -HEP below was instructed and demonstrated, pt did 1 set of each exercise and  verbalized understanding     PATIENT EDUCATION:  Education details: Access Code: [email or web address] Person educated: Patient Education method: Explanation, Demonstration, Tactile cues, Verbal cues, Handout Education comprehension: verbalized understanding, returned demonstration, verbal cues required, and tactile cues required     HOME EXERCISE PROGRAM: Access Code: [email or web address] URL: [email or web address] Date: 12/21/2021 Prepared by: RJamey Reas  Exercises - Supine Heel Slides  - 3 x daily - 7 x weekly - 2-3 sets - 10 reps - Hooklying Gluteal Sets  - 3 x daily - 7 x weekly - 2-3 sets - 10 reps - 5 seconds hold - Supine March  - 3 x daily - 7 x weekly - 2-3 sets - 10 reps - 5 seconds hold - Supine Hip Abduction  - 3 x daily - 7 x weekly - 2-3 sets - 10 reps - Standing hip abduction alternating legs  - 3 x daily - 7 x weekly - 2-3 sets - 10 reps - Standing hip extension alternating legs  - 3 x daily - 7 x weekly - 2-3 sets - 10 reps - Standing Marching  - 3 x daily - 7 x weekly - 2-3 sets - 10 reps - 5 seconds hold - Sit to Stand  - 3 x daily - 7 x weekly - 2-3 sets - 10 reps   ASSESSMENT:   CLINICAL IMPRESSION: Patient presented without much change in status or pain, but walked with less stiffness with the RW. He reported feeling looser in the hip and knee with the Nustep and heel slide exercises. In manual stretching of hip extension, he is largely limited by quad tightness and pain and he will benefit from continued stretching that prioritizes combined quad and hip flexor stretching. His range improved with manual therapy with repetition.    OBJECTIVE IMPAIRMENTS Abnormal gait, decreased activity tolerance, decreased balance, decreased endurance, decreased knowledge of use of DME, decreased mobility, decreased ROM, decreased strength, increased edema, impaired flexibility, postural dysfunction, and pain.    ACTIVITY LIMITATIONS lifting, bending, sitting, standing, squatting,  sleeping, stairs, transfers, bed mobility, and  locomotion level   PARTICIPATION LIMITATIONS: driving, community activity, occupation, and yard work   PERSONAL FACTORS Fitness and 1-2 comorbidities: see pertinent history above  are also affecting patient's functional outcome.    REHAB POTENTIAL: Good   CLINICAL DECISION MAKING: Stable/uncomplicated   EVALUATION COMPLEXITY: Low     GOALS: Goals reviewed with patient? Yes   SHORT TERM GOALS: Target date: 01/15/2022  Patient independent and verbalizes compliance with initial HEP. Baseline: SEE OBJECTIVE DATA Goal status: INITIAL   2.  Patient reports 50% improvement in left hip pain. Baseline: SEE OBJECTIVE DATA Goal status: INITIAL   3.  Patient is able to lift LLE for all bed mobility without manual assistance Baseline: see objective data Goal status: INITIAL     LONG TERM GOALS: Target date: 02/12/2022    Patient will improve FOTO score to 61% Baseline: SEE OBJECTIVE DATA Goal status: INITIAL   2.  Patient reports L hip pain </= 2/10 with standing, sitting, and gait activities. Baseline: SEE OBJECTIVE DATA Goal status: INITIAL   3.  Left hip AROM standing within 10* of RLE.  Baseline: SEE OBJECTIVE DATA Goal status: INITIAL   4.  Left hip PROM within 10* of RLE.  Baseline: SEE OBJECTIVE DATA Goal status: INITIAL   5.  Patient ambulates >500' community distances including negotiating ramps, curbs without device & stairs single rail independently. Baseline: SEE OBJECTIVE DATA Goal status: INITIAL   6.  Patient verbalizes and demonstrates ability to sit and stand for an 8 hour day to return to work without increases in pain. Baseline: SEE OBJECTIVE DATA Goal status: INITIAL     PLAN: PT FREQUENCY:  3x /week for 4 weeks, then 2x /week for 4 weeks   PT DURATION: 8 weeks   PLANNED INTERVENTIONS: Therapeutic exercises, Therapeutic activity, Neuromuscular re-education, Balance training, Gait training, Patient/Family  education, Joint mobilization, Stair training, DME instructions, Electrical stimulation, Cryotherapy, Moist heat, scar mobilization, Taping, Vasopneumatic device, Manual therapy, and physical performance testing   PLAN FOR NEXT SESSION: Nustep, stretching and manual techniques for range including quad and hamstring, sit to stand instruction to protect from hip pain, add basic standing balance activities.  Jana Hakim, SPT 12/22/2021, 11:58 AM  This entire session of physical therapy was performed under the direct supervision of PT signing evaluation /treatment. PT reviewed note and agrees.  Jamey Reas, PT, DPT 12/22/2021, 12:03 PM

## 2021-12-24 ENCOUNTER — Encounter: Payer: Self-pay | Admitting: Physical Therapy

## 2021-12-24 ENCOUNTER — Encounter: Payer: PPO | Admitting: Physical Therapy

## 2021-12-24 ENCOUNTER — Ambulatory Visit (INDEPENDENT_AMBULATORY_CARE_PROVIDER_SITE_OTHER): Payer: PPO | Admitting: Physical Therapy

## 2021-12-24 DIAGNOSIS — M25552 Pain in left hip: Secondary | ICD-10-CM | POA: Diagnosis not present

## 2021-12-24 DIAGNOSIS — M6281 Muscle weakness (generalized): Secondary | ICD-10-CM

## 2021-12-24 DIAGNOSIS — R2689 Other abnormalities of gait and mobility: Secondary | ICD-10-CM | POA: Diagnosis not present

## 2021-12-24 DIAGNOSIS — M25652 Stiffness of left hip, not elsewhere classified: Secondary | ICD-10-CM

## 2021-12-24 NOTE — Therapy (Signed)
OUTPATIENT PHYSICAL THERAPY TREATMENT NOTE   Patient Name: James Gallegos MRN: [id number] DOB:09/06/1950, 71 y.o., male Today's Date: 12/24/2021  PCP: Eunice Blase, MD REFERRING PROVIDER: Mcarthur Rossetti, MD  END OF SESSION:   PT End of Session - 12/24/21 0941     Visit Number 3    Number of Visits 20    Date for PT Re-Evaluation 02/12/22    Authorization Type [employer]    Authorization Time Period $15 Co-pay    Progress Note Due on Visit 10    PT Start Time 0932    PT Stop Time 1015    PT Time Calculation (min) 43 min    Activity Tolerance Patient tolerated treatment well;Patient limited by pain;Patient limited by fatigue    Behavior During Therapy [facility] for tasks assessed/performed             Past Medical History:  Diagnosis Date   Arthritis    back , R foot -   Deep vein thrombosis ([place])    after knee surgery in 2004   Diabetes mellitus without complication ([place])    Hypertension    Murmur, cardiac    pt. reports its difficult to auscultate    Sleep apnea    CPAP- last study- 2014 cpap set on 13 per pt   Wears glasses 11/20/2020   for reading   Past Surgical History:  Procedure Laterality Date   ANTERIOR CERVICAL DECOMP/DISCECTOMY FUSION N/A 04/14/2020   Procedure: ANTERIOR CERVICAL DECOMPRESSION/DISCECTOMY FUSION, INTERBODY PROSTHESIS, PLATE/SCREWS CERVICAL THREE-FOUR,CERVICAL FOUR-FIVE,CERVICAL SIX-SEVEN;  Surgeon: Newman Pies, MD;  Location: [place];  Service: Neurosurgery;  Laterality: N/A;  anterior   CERVICAL SPINE SURGERY N/A 1990   C5-6 discectomy plates and screws in neck per pt   colonscopy  2017   polyps removed per pt follow up in 5 years   FINGER SURGERY Right 2008   x 3 index finger   KNEE ARTHROSCOPY Bilateral    L 2004, R 2005   MASS EXCISION Right 01/18/2017   Procedure: EXCISION BENIGN RIGHT NECK MASS;  Surgeon: Rozetta Nunnery, MD;  Location: [place];  Service: ENT;  Laterality: Right;    MASS EXCISION Bilateral 03/23/2019   Procedure: EXCISIONS OF RIGHT CHEEK LESION, RIGHT POSTERIOR NECK LIPOMA  AND LEFT NECK NODULE;  Surgeon: Rozetta Nunnery, MD;  Location: [place];  Service: ENT;  Laterality: Bilateral;   [place] Left 12/18/2021   Procedure: LEFT TOTAL HIP ARTHROPLASTY ANTERIOR APPROACH;  Surgeon: Mcarthur Rossetti, MD;  Location: [facility];  Service: Orthopedics;  Laterality: Left;   TOTAL KNEE ARTHROPLASTY Right 06/01/2016   Procedure: TOTAL KNEE ARTHROPLASTY;  Surgeon: [name], MD;  Location: [place];  Service: Orthopedics;  Laterality: Right;   TOTAL KNEE ARTHROPLASTY Left 07/25/2018   Procedure: LEFT TOTAL KNEE ARTHROPLASTY;  Surgeon: [name], MD;  Location: [place];  Service: Orthopedics;  Laterality: Left;   ULNAR NERVE TRANSPOSITION Left 11/25/2020   Procedure: ulnar nerve decompression left elbow;  Surgeon: [name], MD;  Location: [place];  Service: Orthopedics;  Laterality: Left;   Patient Active Problem List   Diagnosis Date Noted   Status post total replacement of left hip 12/18/2021   Unilateral primary osteoarthritis, left hip 11/03/2021   Pain in left hip 09/03/2021   Neuropathy, 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   Cervical spondylosis with myelopathy and radiculopathy 04/14/2020   Impingement syndrome of right  shoulder 10/10/2019   Low back pain 10/10/2019   Irritation of ulnar nerve, left 08/28/2019   Class 2 obesity due to excess calories without serious comorbidity with body mass index (BMI) of 39.0 to 39.9 in adult 01/08/2019   Pain in right elbow 12/06/2018   Primary osteoarthritis of left knee 07/25/2018   History of left knee replacement 07/25/2018   Right foot pain 06/22/2018   Acute left-sided low back pain with left-sided sciatica 06/22/2018   Osteoarthritis 11/21/2017   Hyperlipidemia 11/21/2017   Low testosterone 09/26/2016   Healthcare maintenance  09/26/2016   S/P total knee replacement using cement, right 06/01/2016   Sleep apnea 05/19/2016   Hypertension 05/19/2016    REFERRING DIAG: M16.12 (ICD-10-CM) - Unilateral primary osteoarthritis, left hip & M25.552 (ICD-10-CM) - Pain in left hip  THERAPY DIAG:  Muscle weakness (generalized)  Stiffness of left hip, not elsewhere classified  Pain in left hip  Other abnormalities of gait and mobility  Rationale for Evaluation and Treatment Rehabilitation  PERTINENT HISTORY: OA, neuropathy, cervical spondylosis, LBP with left side sciatica, obesity, Lt '20 & Rt '17 TKA, HTN, DVT after knee sg, cardiac murmur,   PRECAUTIONS: Anterior hip  SUBJECTIVE: He reports he had to stop taking the pain meds as they make him constipated.  PAIN:  Are you having pain? Yes: NPRS scale: 4/10 Pain location: lateral left hip around incision Pain description: ache Aggravating factors: trying to move LE Relieving factors: not moving my leg   OBJECTIVE: (objective measures completed at initial evaluation unless otherwise dated)   DIAGNOSTIC FINDINGS: 12/18/2021  There has been interval left total hip replacement. The prosthetic components are in near anatomical alignment. Soft tissue emphysema and skin staples. Thin linear lucency extending from the acetabulum inferiorly into the ischium and is suspicious for a nondisplaced fracture line.   PATIENT SURVEYS:  FOTO 12/21/21 40% functional, target 61%   COGNITION: Overall cognitive status: Within functional limits for tasks assessed                          SENSATION: Light touch: WFL   POSTURE: rounded shoulders, forward head, anterior pelvic tilt, and weight shift right   PALPATION: Tender to palpation laterally and anterior of incision to greater trochanter Tightness on lateral hip to mid thigh   LOWER EXTREMITY ROM:   ROM P:passive  A:active Right eval Left eval  Hip flexion      Hip extension   Supine P: -19*  Hip abduction       Hip adduction      Hip internal rotation      Hip external rotation      Knee flexion      Knee extension      Ankle dorsiflexion      Ankle plantarflexion      Ankle inversion      Ankle eversion       (Blank rows = not tested)   LOWER EXTREMITY MMT:   MMT Right eval Left eval  Hip flexion   2+/5  Hip extension   Standing & functional 3-/5  Hip abduction   Standing & functional 3-/5  Hip adduction      Hip internal rotation      Hip external rotation      Knee flexion      Knee extension      Ankle dorsiflexion      Ankle plantarflexion      Ankle inversion  Ankle eversion       (Blank rows = not tested)     FUNCTIONAL TESTS:  Sit to & from stand requires use of BUEs on armrests with limited hip flexion and RW to stabalize upon arising.  Pt requires mod to max A for LLE in & out of bed.    GAIT: Distance walked: 187f Assistive device utilized: WEnvironmental consultant- 2 wheeled Level of assistance; verbal cues (supervision) Comments: stiff gait with reduced L hip extension, hip hiking & vaulting to clear LLE, antalgic gait with decreased stance LLE     TODAY'S TREATMENT: 12/24/21 -Nu step L5 X 8 min UE/LE -Seated LAQ 3#  3X10 on left -Standing marches with UE support X 15 bilat -Standing hip abd with UE support X 15 bilat -Sit to stands from slightly elevated mat X 10 with UE support PRN -Supine hip add ball squeeze 5 sec X10 -Supine clams red X 20 -Standing hip flexor stretch with UE support 20 sec X 3  Manual therapy -Lt hip PROM to tolerance, manual hip flexor stretching  12/22/2021 Therapeutic Exercise: -Nustep seat 9 BUEs/BLEs for 8 min -Seated hamstring stretch with band LLE 2x 30 sec -Seated heel slides on pillowcase LLE 1x 30s PT reviewed HEP using HO that was issued today. He verbalized understanding.   Manual: -supine Hip flexion, extension, abduction, adduction & quad stretch PROM with PT manual assistance 5-10 second hold at stretch  Therapeutic  Activity: PT recommended weight shifting onto LLE for 3-5 sec 5 reps upon arising prior to initiating gait. Pt return demo reporting less discomfort with gait. Pt amb with RW with improved weight bearing in stance & flexion for swing today compared to eval yesterday. He still requires BUE support on RW.    12/21/2021 Therapeutic Exercise: -HEP below was instructed and demonstrated, pt did 1 set of each exercise and verbalized understanding     PATIENT EDUCATION:  Education details: Access Code: [email or web address] Person educated: Patient Education method: Explanation, Demonstration, Tactile cues, Verbal cues, Handout Education comprehension: verbalized understanding, returned demonstration, verbal cues required, and tactile cues required     HOME EXERCISE PROGRAM: Access Code: [email or web address] URL: [email or web address] Date: 12/21/2021 Prepared by: RJamey Reas  Exercises - Supine Heel Slides  - 3 x daily - 7 x weekly - 2-3 sets - 10 reps - Hooklying Gluteal Sets  - 3 x daily - 7 x weekly - 2-3 sets - 10 reps - 5 seconds hold - Supine March  - 3 x daily - 7 x weekly - 2-3 sets - 10 reps - 5 seconds hold - Supine Hip Abduction  - 3 x daily - 7 x weekly - 2-3 sets - 10 reps - Standing hip abduction alternating legs  - 3 x daily - 7 x weekly - 2-3 sets - 10 reps - Standing hip extension alternating legs  - 3 x daily - 7 x weekly - 2-3 sets - 10 reps - Standing Marching  - 3 x daily - 7 x weekly - 2-3 sets - 10 reps - 5 seconds hold - Sit to Stand  - 3 x daily - 7 x weekly - 2-3 sets - 10 reps   ASSESSMENT:   CLINICAL IMPRESSION: He is doing reasonably well to this point but does have expected stiffness and weakness still that we are addressing in PT to his overall pain tolerance.    OBJECTIVE IMPAIRMENTS Abnormal gait, decreased activity tolerance, decreased balance, decreased endurance, decreased knowledge of use of DME,  decreased mobility, decreased ROM, decreased strength,  increased edema, impaired flexibility, postural dysfunction, and pain.    ACTIVITY LIMITATIONS lifting, bending, sitting, standing, squatting, sleeping, stairs, transfers, bed mobility, and locomotion level   PARTICIPATION LIMITATIONS: driving, community activity, occupation, and yard work   PERSONAL FACTORS Fitness and 1-2 comorbidities: see pertinent history above  are also affecting patient's functional outcome.    REHAB POTENTIAL: Good   CLINICAL DECISION MAKING: Stable/uncomplicated   EVALUATION COMPLEXITY: Low     GOALS: Goals reviewed with patient? Yes   SHORT TERM GOALS: Target date: 01/15/2022  Patient independent and verbalizes compliance with initial HEP. Baseline: SEE OBJECTIVE DATA Goal status: INITIAL   2.  Patient reports 50% improvement in left hip pain. Baseline: SEE OBJECTIVE DATA Goal status: INITIAL   3.  Patient is able to lift LLE for all bed mobility without manual assistance Baseline: see objective data Goal status: INITIAL     LONG TERM GOALS: Target date: 02/12/2022    Patient will improve FOTO score to 61% Baseline: SEE OBJECTIVE DATA Goal status: INITIAL   2.  Patient reports L hip pain </= 2/10 with standing, sitting, and gait activities. Baseline: SEE OBJECTIVE DATA Goal status: INITIAL   3.  Left hip AROM standing within 10* of RLE.  Baseline: SEE OBJECTIVE DATA Goal status: INITIAL   4.  Left hip PROM within 10* of RLE.  Baseline: SEE OBJECTIVE DATA Goal status: INITIAL   5.  Patient ambulates >500' community distances including negotiating ramps, curbs without device & stairs single rail independently. Baseline: SEE OBJECTIVE DATA Goal status: INITIAL   6.  Patient verbalizes and demonstrates ability to sit and stand for an 8 hour day to return to work without increases in pain. Baseline: SEE OBJECTIVE DATA Goal status: INITIAL     PLAN: PT FREQUENCY:  3x /week for 4 weeks, then 2x /week for 4 weeks   PT DURATION: 8 weeks    PLANNED INTERVENTIONS: Therapeutic exercises, Therapeutic activity, Neuromuscular re-education, Balance training, Gait training, Patient/Family education, Joint mobilization, Stair training, DME instructions, Electrical stimulation, Cryotherapy, Moist heat, scar mobilization, Taping, Vasopneumatic device, Manual therapy, and physical performance testing   PLAN FOR NEXT SESSION: Nustep, stretching and manual techniques for range including quad and hamstring, standing and also NWB ROM and strength to tolerance.   Debbe Odea, PT, DPT 12/24/2021, 9:43 AM

## 2021-12-25 ENCOUNTER — Telehealth: Payer: Self-pay | Admitting: *Deleted

## 2021-12-25 NOTE — Telephone Encounter (Signed)
Ortho bundle 7 day call completed. 

## 2021-12-28 ENCOUNTER — Encounter: Payer: Self-pay | Admitting: Physical Therapy

## 2021-12-28 ENCOUNTER — Encounter: Payer: PPO | Admitting: Physical Therapy

## 2021-12-28 ENCOUNTER — Ambulatory Visit: Payer: PPO | Admitting: Physical Therapy

## 2021-12-28 DIAGNOSIS — R2689 Other abnormalities of gait and mobility: Secondary | ICD-10-CM | POA: Diagnosis not present

## 2021-12-28 DIAGNOSIS — M6281 Muscle weakness (generalized): Secondary | ICD-10-CM | POA: Diagnosis not present

## 2021-12-28 DIAGNOSIS — M25652 Stiffness of left hip, not elsewhere classified: Secondary | ICD-10-CM

## 2021-12-28 DIAGNOSIS — M25552 Pain in left hip: Secondary | ICD-10-CM | POA: Diagnosis not present

## 2021-12-28 NOTE — Therapy (Signed)
OUTPATIENT PHYSICAL THERAPY TREATMENT NOTE   Patient Name: James Gallegos MRN: [id number] DOB:1951/05/31, 71 y.o., male Today's Date: 12/28/2021  PCP: Eunice Blase, MD REFERRING PROVIDER: Mcarthur Rossetti, MD  END OF SESSION:   PT End of Session - 12/28/21 1311     Visit Number 4    Number of Visits 20    Date for PT Re-Evaluation 02/12/22    Authorization Type [employer]    Authorization Time Period $15 Co-pay    Progress Note Due on Visit 10    PT Start Time 1303    PT Stop Time 1352    PT Time Calculation (min) 49 min    Activity Tolerance Patient tolerated treatment well;Patient limited by pain;Patient limited by fatigue    Behavior During Therapy [facility] for tasks assessed/performed             Past Medical History:  Diagnosis Date   Arthritis    back , R foot -   Deep vein thrombosis ([place])    after knee surgery in 2004   Diabetes mellitus without complication ([place])    Hypertension    Murmur, cardiac    pt. reports its difficult to auscultate    Sleep apnea    CPAP- last study- 2014 cpap set on 13 per pt   Wears glasses 11/20/2020   for reading   Past Surgical History:  Procedure Laterality Date   ANTERIOR CERVICAL DECOMP/DISCECTOMY FUSION N/A 04/14/2020   Procedure: ANTERIOR CERVICAL DECOMPRESSION/DISCECTOMY FUSION, INTERBODY PROSTHESIS, PLATE/SCREWS CERVICAL THREE-FOUR,CERVICAL FOUR-FIVE,CERVICAL SIX-SEVEN;  Surgeon: Newman Pies, MD;  Location: [place];  Service: Neurosurgery;  Laterality: N/A;  anterior   CERVICAL SPINE SURGERY N/A 1990   C5-6 discectomy plates and screws in neck per pt   colonscopy  2017   polyps removed per pt follow up in 5 years   FINGER SURGERY Right 2008   x 3 index finger   KNEE ARTHROSCOPY Bilateral    L 2004, R 2005   MASS EXCISION Right 01/18/2017   Procedure: EXCISION BENIGN RIGHT NECK MASS;  Surgeon: Rozetta Nunnery, MD;  Location: [place];  Service: ENT;  Laterality: Right;    MASS EXCISION Bilateral 03/23/2019   Procedure: EXCISIONS OF RIGHT CHEEK LESION, RIGHT POSTERIOR NECK LIPOMA  AND LEFT NECK NODULE;  Surgeon: Rozetta Nunnery, MD;  Location: [place];  Service: ENT;  Laterality: Bilateral;   [place] Left 12/18/2021   Procedure: LEFT TOTAL HIP ARTHROPLASTY ANTERIOR APPROACH;  Surgeon: Mcarthur Rossetti, MD;  [facility];  Service: Orthopedics;  Laterality: Left;   TOTAL KNEE ARTHROPLASTY Right 06/01/2016   Procedure: TOTAL KNEE ARTHROPLASTY;  Surgeon: [name], MD;  Location: [place];  Service: Orthopedics;  Laterality: Right;   TOTAL KNEE ARTHROPLASTY Left 07/25/2018   Procedure: LEFT TOTAL KNEE ARTHROPLASTY;  Surgeon: [name], MD;  Location: [place];  Service: Orthopedics;  Laterality: Left;   ULNAR NERVE TRANSPOSITION Left 11/25/2020   Procedure: ulnar nerve decompression left elbow;  Surgeon: [name], MD;  Location: [place];  Service: Orthopedics;  Laterality: Left;   Patient Active Problem List   Diagnosis Date Noted   Status post total replacement of left hip 12/18/2021   Unilateral primary osteoarthritis, left hip 11/03/2021   Pain in left hip 09/03/2021   Neuropathy, 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   Cervical spondylosis with myelopathy and radiculopathy 04/14/2020   Impingement syndrome of right  shoulder 10/10/2019   Low back pain 10/10/2019   Irritation of ulnar nerve, left 08/28/2019   Class 2 obesity due to excess calories without serious comorbidity with body mass index (BMI) of 39.0 to 39.9 in adult 01/08/2019   Pain in right elbow 12/06/2018   Primary osteoarthritis of left knee 07/25/2018   History of left knee replacement 07/25/2018   Right foot pain 06/22/2018   Acute left-sided low back pain with left-sided sciatica 06/22/2018   Osteoarthritis 11/21/2017   Hyperlipidemia 11/21/2017   Low testosterone 09/26/2016   Healthcare maintenance  09/26/2016   S/P total knee replacement using cement, right 06/01/2016   Sleep apnea 05/19/2016   Hypertension 05/19/2016    REFERRING DIAG: M16.12 (ICD-10-CM) - Unilateral primary osteoarthritis, left hip & M25.552 (ICD-10-CM) - Pain in left hip  THERAPY DIAG:  Muscle weakness (generalized)  Stiffness of left hip, not elsewhere classified  Pain in left hip  Other abnormalities of gait and mobility  Rationale for Evaluation and Treatment Rehabilitation  PERTINENT HISTORY: OA, neuropathy, cervical spondylosis, LBP with left side sciatica, obesity, Lt '20 & Rt '17 TKA, HTN, DVT after knee sg, cardiac murmur,   PRECAUTIONS: Anterior hip  SUBJECTIVE: He reports he had some muscle cramps that woke him up last night in his anterior knee. He also reports some edema in legs but left is worse.  PAIN:  Are you having pain? Yes: NPRS scale: 4/10 Pain location: lateral left hip around incision Pain description: ache Aggravating factors: trying to move LE Relieving factors: not moving my leg   OBJECTIVE: (objective measures completed at initial evaluation unless otherwise dated)   DIAGNOSTIC FINDINGS: 12/18/2021  There has been interval left total hip replacement. The prosthetic components are in near anatomical alignment. Soft tissue emphysema and skin staples. Thin linear lucency extending from the acetabulum inferiorly into the ischium and is suspicious for a nondisplaced fracture line.   PATIENT SURVEYS:  FOTO 12/21/21 40% functional, target 61%   COGNITION: Overall cognitive status: Within functional limits for tasks assessed                          SENSATION: Light touch: WFL   POSTURE: rounded shoulders, forward head, anterior pelvic tilt, and weight shift right   PALPATION: Tender to palpation laterally and anterior of incision to greater trochanter Tightness on lateral hip to mid thigh   LOWER EXTREMITY ROM:   ROM P:passive  A:active Left eval Left 12/28/21  Hip  flexion   P: 93  Hip extension Supine P: -19* P:-5  Hip abduction     Hip adduction     Hip internal rotation     Hip external rotation     Knee flexion     Knee extension     Ankle dorsiflexion     Ankle plantarflexion     Ankle inversion     Ankle eversion      (Blank rows = not tested)   LOWER EXTREMITY MMT:   MMT Right eval Left eval Left 12/28/21  Hip flexion   2+/5 3- (can lift against gravity in standing but not yet in supine)  Hip extension   Standing & functional 3-/5   Hip abduction   Standing & functional 3-/5   Hip adduction       Hip internal rotation       Hip external rotation       Knee flexion       Knee  extension       Ankle dorsiflexion       Ankle plantarflexion       Ankle inversion       Ankle eversion        (Blank rows = not tested)     FUNCTIONAL TESTS:  Sit to & from stand requires use of BUEs on armrests with limited hip flexion and RW to stabalize upon arising.  Pt requires mod to max A for LLE in & out of bed.    GAIT: Distance walked: 17f Assistive device utilized: WEnvironmental consultant- 2 wheeled Level of assistance; verbal cues (supervision) Comments: stiff gait with reduced L hip extension, hip hiking & vaulting to clear LLE, antalgic gait with decreased stance LLE     TODAY'S TREATMENT: 12/28/21 -Nu step L5 X 8 min UE/LE -Leg press DL 31# 2X15  -Seated LAQ 3#  3X10 on left -Standing marches with UE support X 15 bilat -Standing hip abd with UE support X 15 bilat -Sit to stands from slightly elevated mat X 10 with UE support PRN -Supine left hip flexion marches 2X10 in hookling, eccentrics (up using assist from strap, then no help from strap coming down)  -Supine hip add ball squeeze 5 sec X10 -Supine clams blue X 20 -Standing hip flexor stretch with UE support 30 sec X 3 -Gait with SPC 50 feet with supervsion  Manual therapy -Lt hip PROM to tolerance, manual hip flexor stretching  12/24/21 -Nu step L5 X 8 min UE/LE -Seated LAQ 3#   3X10 on left -Standing marches with UE support X 15 bilat -Standing hip abd with UE support X 15 bilat -Sit to stands from slightly elevated mat X 10 with UE support PRN -Supine hip add ball squeeze 5 sec X10 -Supine clams red X 20 -Standing hip flexor stretch with UE support 20 sec X 3  Manual therapy -Lt hip PROM to tolerance, manual hip flexor stretching   PATIENT EDUCATION:  Education details: Access Code: [email or web address] Person educated: Patient Education method: Explanation, Demonstration, Tactile cues, Verbal cues, Handout Education comprehension: verbalized understanding, returned demonstration, verbal cues required, and tactile cues required     HOME EXERCISE PROGRAM: Access Code: [email or web address] URL: [email or web address] Date: 12/21/2021 Prepared by: RJamey Reas  Exercises - Supine Heel Slides  - 3 x daily - 7 x weekly - 2-3 sets - 10 reps - Hooklying Gluteal Sets  - 3 x daily - 7 x weekly - 2-3 sets - 10 reps - 5 seconds hold - Supine March  - 3 x daily - 7 x weekly - 2-3 sets - 10 reps - 5 seconds hold - Supine Hip Abduction  - 3 x daily - 7 x weekly - 2-3 sets - 10 reps - Standing hip abduction alternating legs  - 3 x daily - 7 x weekly - 2-3 sets - 10 reps - Standing hip extension alternating legs  - 3 x daily - 7 x weekly - 2-3 sets - 10 reps - Standing Marching  - 3 x daily - 7 x weekly - 2-3 sets - 10 reps - 5 seconds hold - Sit to Stand  - 3 x daily - 7 x weekly - 2-3 sets - 10 reps   ASSESSMENT:   CLINICAL IMPRESSION: He is still struggling with hip flexion strength as expected I will have him add supine eccentrics for this at home using strap/belt to assist up. ROM is improving well but still with some expected stiffness with this  as well. PT recommending to continue current POC.   OBJECTIVE IMPAIRMENTS Abnormal gait, decreased activity tolerance, decreased balance, decreased endurance, decreased knowledge of use of DME, decreased mobility, decreased  ROM, decreased strength, increased edema, impaired flexibility, postural dysfunction, and pain.    ACTIVITY LIMITATIONS lifting, bending, sitting, standing, squatting, sleeping, stairs, transfers, bed mobility, and locomotion level   PARTICIPATION LIMITATIONS: driving, community activity, occupation, and yard work   PERSONAL FACTORS Fitness and 1-2 comorbidities: see pertinent history above  are also affecting patient's functional outcome.    REHAB POTENTIAL: Good   CLINICAL DECISION MAKING: Stable/uncomplicated   EVALUATION COMPLEXITY: Low     GOALS: Goals reviewed with patient? Yes   SHORT TERM GOALS: Target date: 01/15/2022  Patient independent and verbalizes compliance with initial HEP. Baseline: SEE OBJECTIVE DATA Goal status: INITIAL   2.  Patient reports 50% improvement in left hip pain. Baseline: SEE OBJECTIVE DATA Goal status: INITIAL   3.  Patient is able to lift LLE for all bed mobility without manual assistance Baseline: see objective data Goal status: INITIAL     LONG TERM GOALS: Target date: 02/12/2022    Patient will improve FOTO score to 61% Baseline: SEE OBJECTIVE DATA Goal status: INITIAL   2.  Patient reports L hip pain </= 2/10 with standing, sitting, and gait activities. Baseline: SEE OBJECTIVE DATA Goal status: INITIAL   3.  Left hip AROM standing within 10* of RLE.  Baseline: SEE OBJECTIVE DATA Goal status: INITIAL   4.  Left hip PROM within 10* of RLE.  Baseline: SEE OBJECTIVE DATA Goal status: INITIAL   5.  Patient ambulates >500' community distances including negotiating ramps, curbs without device & stairs single rail independently. Baseline: SEE OBJECTIVE DATA Goal status: INITIAL   6.  Patient verbalizes and demonstrates ability to sit and stand for an 8 hour day to return to work without increases in pain. Baseline: SEE OBJECTIVE DATA Goal status: INITIAL     PLAN: PT FREQUENCY:  3x /week for 4 weeks, then 2x /week for 4 weeks    PT DURATION: 8 weeks   PLANNED INTERVENTIONS: Therapeutic exercises, Therapeutic activity, Neuromuscular re-education, Balance training, Gait training, Patient/Family education, Joint mobilization, Stair training, DME instructions, Electrical stimulation, Cryotherapy, Moist heat, scar mobilization, Taping, Vasopneumatic device, Manual therapy, and physical performance testing   PLAN FOR NEXT SESSION: Nustep, stretching and manual techniques for range for hip flexion and extension, needs hip flexion strength emphasis  Debbe Odea, PT, DPT 12/28/2021, 2:00 PM

## 2021-12-30 ENCOUNTER — Ambulatory Visit: Payer: PPO | Admitting: Physical Therapy

## 2021-12-30 ENCOUNTER — Encounter: Payer: Self-pay | Admitting: Physical Therapy

## 2021-12-30 ENCOUNTER — Encounter: Payer: PPO | Admitting: Physical Therapy

## 2021-12-30 DIAGNOSIS — M6281 Muscle weakness (generalized): Secondary | ICD-10-CM | POA: Diagnosis not present

## 2021-12-30 DIAGNOSIS — M25552 Pain in left hip: Secondary | ICD-10-CM

## 2021-12-30 DIAGNOSIS — R2689 Other abnormalities of gait and mobility: Secondary | ICD-10-CM

## 2021-12-30 DIAGNOSIS — R2681 Unsteadiness on feet: Secondary | ICD-10-CM

## 2021-12-30 DIAGNOSIS — M25652 Stiffness of left hip, not elsewhere classified: Secondary | ICD-10-CM

## 2021-12-30 NOTE — Therapy (Signed)
OUTPATIENT PHYSICAL THERAPY TREATMENT NOTE   Patient Name: James Gallegos MRN: [id number] DOB:[birth year], 71 y.o., male Today's Date: 12/30/2021  PCP: Eunice Blase, MD REFERRING PROVIDER: Mcarthur Rossetti, MD  END OF SESSION:   PT End of Session - 12/30/21 1301     Visit Number 5    Number of Visits 20    Date for PT Re-Evaluation 02/12/22    Authorization Type [employer]    Authorization Time Period $15 Co-pay    Progress Note Due on Visit 10    PT Start Time 1258    PT Stop Time 1339    PT Time Calculation (min) 41 min    Activity Tolerance Patient tolerated treatment well;Patient limited by pain;Patient limited by fatigue    Behavior During Therapy [facility] for tasks assessed/performed              Past Medical History:  Diagnosis Date   Arthritis    back , R foot -   Deep vein thrombosis ([place])    after knee surgery in 2004   Diabetes mellitus without complication ([place])    Hypertension    Murmur, cardiac    pt. reports its difficult to auscultate    Sleep apnea    CPAP- last study- 2014 cpap set on 13 per pt   Wears glasses 11/20/2020   for reading   Past Surgical History:  Procedure Laterality Date   ANTERIOR CERVICAL DECOMP/DISCECTOMY FUSION N/A 04/14/2020   Procedure: ANTERIOR CERVICAL DECOMPRESSION/DISCECTOMY FUSION, INTERBODY PROSTHESIS, PLATE/SCREWS CERVICAL THREE-FOUR,CERVICAL FOUR-FIVE,CERVICAL SIX-SEVEN;  Surgeon: Newman Pies, MD;  Location: [place];  Service: Neurosurgery;  Laterality: N/A;  anterior   CERVICAL SPINE SURGERY N/A 1990   C5-6 discectomy plates and screws in neck per pt   colonscopy  2017   polyps removed per pt follow up in 5 years   FINGER SURGERY Right 2008   x 3 index finger   KNEE ARTHROSCOPY Bilateral    L 2004, R 2005   MASS EXCISION Right 01/18/2017   Procedure: EXCISION BENIGN RIGHT NECK MASS;  Surgeon: Rozetta Nunnery, MD;  Location: [place];  Service: ENT;  Laterality: Right;    MASS EXCISION Bilateral 03/23/2019   Procedure: EXCISIONS OF RIGHT CHEEK LESION, RIGHT POSTERIOR NECK LIPOMA  AND LEFT NECK NODULE;  Surgeon: Rozetta Nunnery, MD;  Location: [place];  Service: ENT;  Laterality: Bilateral;   [place] Left 12/18/2021   Procedure: LEFT TOTAL HIP ARTHROPLASTY ANTERIOR APPROACH;  Surgeon: Mcarthur Rossetti, MD;  Location: [facility];  Service: Orthopedics;  Laterality: Left;   TOTAL KNEE ARTHROPLASTY Right 06/01/2016   Procedure: TOTAL KNEE ARTHROPLASTY;  Surgeon: [name], MD;  Location: [place];  Service: Orthopedics;  Laterality: Right;   TOTAL KNEE ARTHROPLASTY Left 07/25/2018   Procedure: LEFT TOTAL KNEE ARTHROPLASTY;  Surgeon: [name], MD;  Location: [place];  Service: Orthopedics;  Laterality: Left;   ULNAR NERVE TRANSPOSITION Left 11/25/2020   Procedure: ulnar nerve decompression left elbow;  Surgeon: [name], MD;  Location: [place];  Service: Orthopedics;  Laterality: Left;   Patient Active Problem List   Diagnosis Date Noted   Status post total replacement of left hip 12/18/2021   Unilateral primary osteoarthritis, left hip 11/03/2021   Pain in left hip 09/03/2021   Neuropathy, 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   Cervical spondylosis with myelopathy and radiculopathy 04/14/2020   Impingement syndrome of  right shoulder 10/10/2019   Low back pain 10/10/2019   Irritation of ulnar nerve, left 08/28/2019   Class 2 obesity due to excess calories without serious comorbidity with body mass index (BMI) of 39.0 to 39.9 in adult 01/08/2019   Pain in right elbow 12/06/2018   Primary osteoarthritis of left knee 07/25/2018   History of left knee replacement 07/25/2018   Right foot pain 06/22/2018   Acute left-sided low back pain with left-sided sciatica 06/22/2018   Osteoarthritis 11/21/2017   Hyperlipidemia 11/21/2017   Low testosterone 09/26/2016   Healthcare maintenance  09/26/2016   S/P total knee replacement using cement, right 06/01/2016   Sleep apnea 05/19/2016   Hypertension 05/19/2016    REFERRING DIAG: M16.12 (ICD-10-CM) - Unilateral primary osteoarthritis, left hip & M25.552 (ICD-10-CM) - Pain in left hip  THERAPY DIAG:  Muscle weakness (generalized)  Stiffness of left hip, not elsewhere classified  Pain in left hip  Other abnormalities of gait and mobility  Unsteadiness on feet  Rationale for Evaluation and Treatment Rehabilitation  PERTINENT HISTORY: OA, neuropathy, cervical spondylosis, LBP with left side sciatica, obesity, Lt '20 & Rt '17 TKA, HTN, DVT after knee sg, cardiac murmur,   PRECAUTIONS: Anterior hip  SUBJECTIVE: doing well, walking more with a cane which he feels is helping his mobility.  Gets staples out tomorrow  PAIN: Are you having pain? Yes: NPRS scale: 2/10 Pain location: lateral left hip around incision Pain description: ache Aggravating factors: trying to move LE Relieving factors: not moving my leg   OBJECTIVE: (objective measures completed at initial evaluation unless otherwise dated)   DIAGNOSTIC FINDINGS: 12/18/2021  There has been interval left total hip replacement. The prosthetic components are in near anatomical alignment. Soft tissue emphysema and skin staples. Thin linear lucency extending from the acetabulum inferiorly into the ischium and is suspicious for a nondisplaced fracture line.   PATIENT SURVEYS:  12/21/21 FOTO  40% functional, target 61%   PALPATION: Tender to palpation laterally and anterior of incision to greater trochanter Tightness on lateral hip to mid thigh   LOWER EXTREMITY ROM:   ROM P:passive  A:active Left eval Left 12/28/21  Hip flexion   P: 93  Hip extension Supine P: -19* P:-5  Hip abduction     Hip adduction     Hip internal rotation     Hip external rotation     Knee flexion     Knee extension     Ankle dorsiflexion     Ankle plantarflexion     Ankle  inversion     Ankle eversion      (Blank rows = not tested)   LOWER EXTREMITY MMT:   MMT Right eval Left eval Left 12/28/21  Hip flexion   2+/5 3- (can lift against gravity in standing but not yet in supine)  Hip extension   Standing & functional 3-/5   Hip abduction   Standing & functional 3-/5   Hip adduction       Hip internal rotation       Hip external rotation       Knee flexion       Knee extension       Ankle dorsiflexion       Ankle plantarflexion       Ankle inversion       Ankle eversion        (Blank rows = not tested)     FUNCTIONAL TESTS:  Sit to & from stand requires use  of BUEs on armrests with limited hip flexion and RW to stabalize upon arising.  Pt requires mod to max A for LLE in & out of bed.    GAIT: Distance walked: 178f Assistive device utilized: WEnvironmental consultant- 2 wheeled Level of assistance; verbal cues (supervision) Comments: stiff gait with reduced L hip extension, hip hiking & vaulting to clear LLE, antalgic gait with decreased stance LLE     TODAY'S TREATMENT: 12/30/21 Therex: NuStep L7 x 8 min Leg Press 50# 3x10 Gastroc stretch on slant board 3x30 sec Seated Lt hamstring stretc 3x30 sec bil Sit to/from stand x 10 reps, min UE support Seated AA marching (AA concentric, A eccentric) on Lt x 10 reps Standing SLR flexion, abduction, extension x10 reps bil - decr UE support when standing on LLE Tandem walking with bil UE support in // bars x 3 laps  12/28/21 -Nu step L5 X 8 min UE/LE -Leg press DL 31# 2X15  -Seated LAQ 3#  3X10 on left -Standing marches with UE support X 15 bilat -Standing hip abd with UE support X 15 bilat -Sit to stands from slightly elevated mat X 10 with UE support PRN -Supine left hip flexion marches 2X10 in hookling, eccentrics (up using assist from strap, then no help from strap coming down)  -Supine hip add ball squeeze 5 sec X10 -Supine clams blue X 20 -Standing hip flexor stretch with UE support 30 sec X 3 -Gait  with SPC 50 feet with supervsion  Manual therapy -Lt hip PROM to tolerance, manual hip flexor stretching  12/24/21 -Nu step L5 X 8 min UE/LE -Seated LAQ 3#  3X10 on left -Standing marches with UE support X 15 bilat -Standing hip abd with UE support X 15 bilat -Sit to stands from slightly elevated mat X 10 with UE support PRN -Supine hip add ball squeeze 5 sec X10 -Supine clams red X 20 -Standing hip flexor stretch with UE support 20 sec X 3  Manual therapy -Lt hip PROM to tolerance, manual hip flexor stretching   PATIENT EDUCATION:  Education details: Access Code: [email or web address] Person educated: Patient Education method: Explanation, Demonstration, Tactile cues, Verbal cues, Handout Education comprehension: verbalized understanding, returned demonstration, verbal cues required, and tactile cues required     HOME EXERCISE PROGRAM: Access Code: [email or web address] URL: [email or web address] Date: 12/30/2021 Prepared by: SFaustino Congress Exercises - Supine Heel Slides  - 3 x daily - 7 x weekly - 2-3 sets - 10 reps - Hooklying Gluteal Sets  - 3 x daily - 7 x weekly - 2-3 sets - 10 reps - 5 seconds hold - Supine March  - 3 x daily - 7 x weekly - 2-3 sets - 10 reps - 5 seconds hold - Supine Hip Abduction  - 3 x daily - 7 x weekly - 2-3 sets - 10 reps - Standing hip abduction alternating legs  - 3 x daily - 7 x weekly - 2-3 sets - 10 reps - Standing hip extension alternating legs  - 3 x daily - 7 x weekly - 2-3 sets - 10 reps - Standing Marching  - 3 x daily - 7 x weekly - 2-3 sets - 10 reps - 5 seconds hold - Sit to Stand  - 3 x daily - 7 x weekly - 2-3 sets - 10 reps - Standing Bilateral Gastroc Stretch with Step  - 2 x daily - 7 x weekly - 1 sets - 3 reps - 30 sec hold - Seated Hamstring Stretch  -  2 x daily - 7 x weekly - 1 sets - 3 reps - 30 sec hold   ASSESSMENT:   CLINICAL IMPRESSION: Knee pain improved after session today, so added stretches to see if this helps to calm  down pain.  Still having increased swelling and hip weakness affecting functional mobility, but now amb with SPC.  Will continue to benefit from PT to maximize function.   OBJECTIVE IMPAIRMENTS Abnormal gait, decreased activity tolerance, decreased balance, decreased endurance, decreased knowledge of use of DME, decreased mobility, decreased ROM, decreased strength, increased edema, impaired flexibility, postural dysfunction, and pain.    ACTIVITY LIMITATIONS lifting, bending, sitting, standing, squatting, sleeping, stairs, transfers, bed mobility, and locomotion level   PARTICIPATION LIMITATIONS: driving, community activity, occupation, and yard work   PERSONAL FACTORS Fitness and 1-2 comorbidities: see pertinent history above  are also affecting patient's functional outcome.    REHAB POTENTIAL: Good   CLINICAL DECISION MAKING: Stable/uncomplicated   EVALUATION COMPLEXITY: Low     GOALS: Goals reviewed with patient? Yes   SHORT TERM GOALS: Target date: 01/15/2022  Patient independent and verbalizes compliance with initial HEP. Baseline: SEE OBJECTIVE DATA Goal status: INITIAL   2.  Patient reports 50% improvement in left hip pain. Baseline: SEE OBJECTIVE DATA Goal status: INITIAL   3.  Patient is able to lift LLE for all bed mobility without manual assistance Baseline: see objective data Goal status: INITIAL     LONG TERM GOALS: Target date: 02/12/2022    Patient will improve FOTO score to 61% Baseline: SEE OBJECTIVE DATA Goal status: INITIAL   2.  Patient reports L hip pain </= 2/10 with standing, sitting, and gait activities. Baseline: SEE OBJECTIVE DATA Goal status: INITIAL   3.  Left hip AROM standing within 10* of RLE.  Baseline: SEE OBJECTIVE DATA Goal status: INITIAL   4.  Left hip PROM within 10* of RLE.  Baseline: SEE OBJECTIVE DATA Goal status: INITIAL   5.  Patient ambulates >500' community distances including negotiating ramps, curbs without device &  stairs single rail independently. Baseline: SEE OBJECTIVE DATA Goal status: INITIAL   6.  Patient verbalizes and demonstrates ability to sit and stand for an 8 hour day to return to work without increases in pain. Baseline: SEE OBJECTIVE DATA Goal status: INITIAL     PLAN: PT FREQUENCY:  3x /week for 4 weeks, then 2x /week for 4 weeks   PT DURATION: 8 weeks   PLANNED INTERVENTIONS: Therapeutic exercises, Therapeutic activity, Neuromuscular re-education, Balance training, Gait training, Patient/Family education, Joint mobilization, Stair training, DME instructions, Electrical stimulation, Cryotherapy, Moist heat, scar mobilization, Taping, Vasopneumatic device, Manual therapy, and physical performance testing   PLAN FOR NEXT SESSION: Nustep, stretching and manual techniques for range for hip flexion and extension, needs hip flexion strength emphasis, gastroc/hamstring stretching PRN  Faustino Congress, PT, DPT 12/30/2021, 1:39 PM

## 2021-12-31 ENCOUNTER — Other Ambulatory Visit (HOSPITAL_COMMUNITY): Payer: Self-pay

## 2021-12-31 ENCOUNTER — Encounter: Payer: Self-pay | Admitting: Orthopaedic Surgery

## 2021-12-31 ENCOUNTER — Telehealth: Payer: Self-pay | Admitting: *Deleted

## 2021-12-31 ENCOUNTER — Encounter: Payer: PPO | Admitting: Rehabilitative and Restorative Service Providers"

## 2021-12-31 ENCOUNTER — Ambulatory Visit (INDEPENDENT_AMBULATORY_CARE_PROVIDER_SITE_OTHER): Payer: PPO | Admitting: Orthopaedic Surgery

## 2021-12-31 DIAGNOSIS — Z96642 Presence of left artificial hip joint: Secondary | ICD-10-CM

## 2021-12-31 MED ORDER — HYDROCODONE-ACETAMINOPHEN 5-325 MG PO TABS
1.0000 | ORAL_TABLET | Freq: Four times a day (QID) | ORAL | 0 refills | Status: DC | PRN
  Filled 2021-12-31: qty 30, 4d supply, fill #0

## 2021-12-31 NOTE — Progress Notes (Signed)
This is the first postoperative visit for James Gallegos who is 71 years old status post a left total hip arthroplasty 2 weeks ago.  He does have a history of both his knees being replaced.  His left knee is certainly sore and explained that is from the surgery itself or we twist the leg around to get to the femoral component.  He is doing well otherwise.  He does still like to have a pain medication called him but something not as strong as oxycodone.  He is ambulating with a cane.  His wife is with him today as well.  On exam he is mobilizing well.  His left hip incision looks good and the staples are removed and Steri-Strips applied.  He has good range of motion of his left hip.  He will continue to increase his activities as comfort allows.  I will send in some hydrocodone for pain.  He can drive when he is off of pain medications.  I would like to see him back in 4 weeks to see how he is doing from a mobility standpoint but no x-rays are needed.

## 2021-12-31 NOTE — Telephone Encounter (Signed)
Ortho bundle 14 day in office meeting completed. Patient doing extremely well overall. F/U 4 weeks.

## 2022-01-01 ENCOUNTER — Encounter: Payer: Self-pay | Admitting: Physical Therapy

## 2022-01-01 ENCOUNTER — Ambulatory Visit: Payer: PPO | Admitting: Physical Therapy

## 2022-01-01 DIAGNOSIS — M6281 Muscle weakness (generalized): Secondary | ICD-10-CM | POA: Diagnosis not present

## 2022-01-01 DIAGNOSIS — R2689 Other abnormalities of gait and mobility: Secondary | ICD-10-CM

## 2022-01-01 DIAGNOSIS — M25552 Pain in left hip: Secondary | ICD-10-CM

## 2022-01-01 DIAGNOSIS — M25652 Stiffness of left hip, not elsewhere classified: Secondary | ICD-10-CM

## 2022-01-01 NOTE — Therapy (Addendum)
OUTPATIENT PHYSICAL THERAPY TREATMENT NOTE   Patient Name: James Gallegos MRN: [id number] DOB:27-Jan-1951, 71 y.o., male Today's Date: 01/01/2022  PCP: Eunice Blase, MD REFERRING PROVIDER: Mcarthur Rossetti, MD  END OF SESSION:   PT End of Session - 01/01/22 1119     Visit Number 6    Number of Visits 20    Date for PT Re-Evaluation 02/12/22    Authorization Type [employer]    Authorization Time Period $15 Co-pay    Progress Note Due on Visit 10    PT Start Time 1100    PT Stop Time 1143    PT Time Calculation (min) 43 min    Activity Tolerance Patient tolerated treatment well;Patient limited by pain;Patient limited by fatigue    Behavior During Therapy [facility] for tasks assessed/performed              Past Medical History:  Diagnosis Date   Arthritis    back , R foot -   Deep vein thrombosis ([place])    after knee surgery in 2004   Diabetes mellitus without complication ([place])    Hypertension    Murmur, cardiac    pt. reports its difficult to auscultate    Sleep apnea    CPAP- last study- 2014 cpap set on 13 per pt   Wears glasses 11/20/2020   for reading   Past Surgical History:  Procedure Laterality Date   ANTERIOR CERVICAL DECOMP/DISCECTOMY FUSION N/A 04/14/2020   Procedure: ANTERIOR CERVICAL DECOMPRESSION/DISCECTOMY FUSION, INTERBODY PROSTHESIS, PLATE/SCREWS CERVICAL THREE-FOUR,CERVICAL FOUR-FIVE,CERVICAL SIX-SEVEN;  Surgeon: Newman Pies, MD;  Location: [place];  Service: Neurosurgery;  Laterality: N/A;  anterior   CERVICAL SPINE SURGERY N/A 1990   C5-6 discectomy plates and screws in neck per pt   colonscopy  2017   polyps removed per pt follow up in 5 years   FINGER SURGERY Right 2008   x 3 index finger   KNEE ARTHROSCOPY Bilateral    L 2004, R 2005   MASS EXCISION Right 01/18/2017   Procedure: EXCISION BENIGN RIGHT NECK MASS;  Surgeon: Rozetta Nunnery, MD;  Location: [place];  Service: ENT;  Laterality: Right;    MASS EXCISION Bilateral 03/23/2019   Procedure: EXCISIONS OF RIGHT CHEEK LESION, RIGHT POSTERIOR NECK LIPOMA  AND LEFT NECK NODULE;  Surgeon: Rozetta Nunnery, MD;  Location: [place];  Service: ENT;  Laterality: Bilateral;   [place] Left 12/18/2021   Procedure: LEFT TOTAL HIP ARTHROPLASTY ANTERIOR APPROACH;  Surgeon: Mcarthur Rossetti, MD;  Location: [facility];  Service: Orthopedics;  Laterality: Left;   TOTAL KNEE ARTHROPLASTY Right 06/01/2016   Procedure: TOTAL KNEE ARTHROPLASTY;  Surgeon: [name], MD;  Location: [place];  Service: Orthopedics;  Laterality: Right;   TOTAL KNEE ARTHROPLASTY Left 07/25/2018   Procedure: LEFT TOTAL KNEE ARTHROPLASTY;  Surgeon: [name], MD;  Location: [place];  Service: Orthopedics;  Laterality: Left;   ULNAR NERVE TRANSPOSITION Left 11/25/2020   Procedure: ulnar nerve decompression left elbow;  Surgeon: [name], MD;  Location: [place];  Service: Orthopedics;  Laterality: Left;   Patient Active Problem List   Diagnosis Date Noted   Status post total replacement of left hip 12/18/2021   Unilateral primary osteoarthritis, left hip 11/03/2021   Pain in left hip 09/03/2021   Neuropathy, 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   Cervical spondylosis with myelopathy and radiculopathy 04/14/2020   Impingement syndrome of  right shoulder 10/10/2019   Low back pain 10/10/2019   Irritation of ulnar nerve, left 08/28/2019   Class 2 obesity due to excess calories without serious comorbidity with body mass index (BMI) of 39.0 to 39.9 in adult 01/08/2019   Pain in right elbow 12/06/2018   Primary osteoarthritis of left knee 07/25/2018   History of left knee replacement 07/25/2018   Right foot pain 06/22/2018   Acute left-sided low back pain with left-sided sciatica 06/22/2018   Osteoarthritis 11/21/2017   Hyperlipidemia 11/21/2017   Low testosterone 09/26/2016   Healthcare maintenance  09/26/2016   S/P total knee replacement using cement, right 06/01/2016   Sleep apnea 05/19/2016   Hypertension 05/19/2016    REFERRING DIAG: M16.12 (ICD-10-CM) - Unilateral primary osteoarthritis, left hip & M25.552 (ICD-10-CM) - Pain in left hip  THERAPY DIAG:  Muscle weakness (generalized)  Stiffness of left hip, not elsewhere classified  Pain in left hip  Other abnormalities of gait and mobility  Rationale for Evaluation and Treatment Rehabilitation  PERTINENT HISTORY: OA, neuropathy, cervical spondylosis, LBP with left side sciatica, obesity, Lt '20 & Rt '17 TKA, HTN, DVT after knee sg, cardiac murmur,   PRECAUTIONS: Anterior hip  SUBJECTIVE: doing well, says MD thinks he can finish up with PT and transition to independent program  PAIN: Are you having pain? Yes: NPRS scale: 2/10 Pain location: lateral left hip around incision Pain description: ache Aggravating factors: trying to move LE Relieving factors: not moving my leg   OBJECTIVE: (objective measures completed at initial evaluation unless otherwise dated)   DIAGNOSTIC FINDINGS: 12/18/2021  There has been interval left total hip replacement. The prosthetic components are in near anatomical alignment. Soft tissue emphysema and skin staples. Thin linear lucency extending from the acetabulum inferiorly into the ischium and is suspicious for a nondisplaced fracture line.   PATIENT SURVEYS:  12/21/21 FOTO  40% functional, target 61%   PALPATION: Tender to palpation laterally and anterior of incision to greater trochanter Tightness on lateral hip to mid thigh   LOWER EXTREMITY ROM:   ROM P:passive  A:active Left eval Left 12/28/21  Hip flexion   P: 93  Hip extension Supine P: -19* P:-5  Hip abduction     Hip adduction     Hip internal rotation     Hip external rotation     Knee flexion     Knee extension     Ankle dorsiflexion     Ankle plantarflexion     Ankle inversion     Ankle eversion      (Blank  rows = not tested)   LOWER EXTREMITY MMT:   MMT Right eval Left eval Left 12/28/21  Hip flexion   2+/5 3- (can lift against gravity in standing but not yet in supine)  Hip extension   Standing & functional 3-/5   Hip abduction   Standing & functional 3-/5   Hip adduction       Hip internal rotation       Hip external rotation       Knee flexion       Knee extension       Ankle dorsiflexion       Ankle plantarflexion       Ankle inversion       Ankle eversion        (Blank rows = not tested)     FUNCTIONAL TESTS:  Sit to & from stand requires use of BUEs on armrests with limited hip  flexion and RW to stabalize upon arising.  Pt requires mod to max A for LLE in & out of bed.    GAIT: Distance walked: 165f Assistive device utilized: WEnvironmental consultant- 2 wheeled Level of assistance; verbal cues (supervision) Comments: stiff gait with reduced L hip extension, hip hiking & vaulting to clear LLE, antalgic gait with decreased stance LLE     TODAY'S TREATMENT: 01/01/22 Therex: NuStep L6 x 8 min Leg Press DL 56# 3x10, then left leg only 25# 2X10 Seated LAQ 4# 2X15 on left Seated SLR on left, 2 sets of 5 in smaller ROM as tolerated Sit to/from stand 2x 10 reps from slightly elevated mat and no UE support March walking 3 round trips at cMercerwith one UE support Tandem walking with one UE support 3 round trips at counter top.  12/30/21 Therex: NuStep L7 x 8 min Leg Press 50# 3x10 Gastroc stretch on slant board 3x30 sec Seated Lt hamstring stretc 3x30 sec bil Sit to/from stand x 10 reps, min UE support Seated AA marching (AA concentric, A eccentric) on Lt x 10 reps Standing SLR flexion, abduction, extension x10 reps bil - decr UE support when standing on LLE Tandem walking with bil UE support in // bars x 3 laps  12/28/21 -Nu step L5 X 8 min UE/LE -Leg press DL 31# 2X15  -Seated LAQ 3#  3X10 on left -Standing marches with UE support X 15 bilat -Standing hip abd with UE  support X 15 bilat -Sit to stands from slightly elevated mat X 10 with UE support PRN -Supine left hip flexion marches 2X10 in hookling, eccentrics (up using assist from strap, then no help from strap coming down)  -Supine hip add ball squeeze 5 sec X10 -Supine clams blue X 20 -Standing hip flexor stretch with UE support 30 sec X 3 -Gait with SPC 50 feet with supervsion  Manual therapy -Lt hip PROM to tolerance, manual hip flexor stretching    PATIENT EDUCATION:  Education details: Access Code: [email or web address] Person educated: Patient Education method: Explanation, Demonstration, Tactile cues, Verbal cues, Handout Education comprehension: verbalized understanding, returned demonstration, verbal cues required, and tactile cues required     HOME EXERCISE PROGRAM: Access Code: [email or web address] URL: [email or web address] Date: 12/30/2021 Prepared by: SFaustino Congress Exercises - Supine Heel Slides  - 3 x daily - 7 x weekly - 2-3 sets - 10 reps - Hooklying Gluteal Sets  - 3 x daily - 7 x weekly - 2-3 sets - 10 reps - 5 seconds hold - Supine March  - 3 x daily - 7 x weekly - 2-3 sets - 10 reps - 5 seconds hold - Supine Hip Abduction  - 3 x daily - 7 x weekly - 2-3 sets - 10 reps - Standing hip abduction alternating legs  - 3 x daily - 7 x weekly - 2-3 sets - 10 reps - Standing hip extension alternating legs  - 3 x daily - 7 x weekly - 2-3 sets - 10 reps - Standing Marching  - 3 x daily - 7 x weekly - 2-3 sets - 10 reps - 5 seconds hold - Sit to Stand  - 3 x daily - 7 x weekly - 2-3 sets - 10 reps - Standing Bilateral Gastroc Stretch with Step  - 2 x daily - 7 x weekly - 1 sets - 3 reps - 30 sec hold - Seated Hamstring Stretch  - 2 x daily - 7 x weekly - 1 sets -  3 reps - 30 sec hold   ASSESSMENT:   CLINICAL IMPRESSION: He relays MD was pleased with his progress and recommends he can finish up with PT. He has 2 more scheduled next week so we will plan to progress HEP, wrap up and  transition him to independent program next week.   OBJECTIVE IMPAIRMENTS Abnormal gait, decreased activity tolerance, decreased balance, decreased endurance, decreased knowledge of use of DME, decreased mobility, decreased ROM, decreased strength, increased edema, impaired flexibility, postural dysfunction, and pain.    ACTIVITY LIMITATIONS lifting, bending, sitting, standing, squatting, sleeping, stairs, transfers, bed mobility, and locomotion level   PARTICIPATION LIMITATIONS: driving, community activity, occupation, and yard work   PERSONAL FACTORS Fitness and 1-2 comorbidities: see pertinent history above  are also affecting patient's functional outcome.    REHAB POTENTIAL: Good   CLINICAL DECISION MAKING: Stable/uncomplicated   EVALUATION COMPLEXITY: Low     GOALS: Goals reviewed with patient? Yes   SHORT TERM GOALS: Target date: 01/15/2022  Patient independent and verbalizes compliance with initial HEP. Baseline: SEE OBJECTIVE DATA Goal status: INITIAL   2.  Patient reports 50% improvement in left hip pain. Baseline: SEE OBJECTIVE DATA Goal status: INITIAL   3.  Patient is able to lift LLE for all bed mobility without manual assistance Baseline: see objective data Goal status: INITIAL     LONG TERM GOALS: Target date: 02/12/2022    Patient will improve FOTO score to 61% Baseline: SEE OBJECTIVE DATA Goal status: INITIAL   2.  Patient reports L hip pain </= 2/10 with standing, sitting, and gait activities. Baseline: SEE OBJECTIVE DATA Goal status: INITIAL   3.  Left hip AROM standing within 10* of RLE.  Baseline: SEE OBJECTIVE DATA Goal status: INITIAL   4.  Left hip PROM within 10* of RLE.  Baseline: SEE OBJECTIVE DATA Goal status: INITIAL   5.  Patient ambulates >500' community distances including negotiating ramps, curbs without device & stairs single rail independently. Baseline: SEE OBJECTIVE DATA Goal status: INITIAL   6.  Patient verbalizes and  demonstrates ability to sit and stand for an 8 hour day to return to work without increases in pain. Baseline: SEE OBJECTIVE DATA Goal status: INITIAL     PLAN: PT FREQUENCY:  3x /week for 4 weeks, then 2x /week for 4 weeks   PT DURATION: 8 weeks   PLANNED INTERVENTIONS: Therapeutic exercises, Therapeutic activity, Neuromuscular re-education, Balance training, Gait training, Patient/Family education, Joint mobilization, Stair training, DME instructions, Electrical stimulation, Cryotherapy, Moist heat, scar mobilization, Taping, Vasopneumatic device, Manual therapy, and physical performance testing   PLAN FOR NEXT SESSION: HEP progressions and DC next week  Debbe Odea, PT, DPT 01/01/2022, 11:21 AM

## 2022-01-04 ENCOUNTER — Encounter: Payer: PPO | Admitting: Physical Therapy

## 2022-01-05 ENCOUNTER — Encounter: Payer: Self-pay | Admitting: Physical Therapy

## 2022-01-05 ENCOUNTER — Ambulatory Visit (INDEPENDENT_AMBULATORY_CARE_PROVIDER_SITE_OTHER): Payer: PPO | Admitting: Physical Therapy

## 2022-01-05 ENCOUNTER — Other Ambulatory Visit (HOSPITAL_COMMUNITY): Payer: Self-pay

## 2022-01-05 DIAGNOSIS — R2681 Unsteadiness on feet: Secondary | ICD-10-CM

## 2022-01-05 DIAGNOSIS — M25652 Stiffness of left hip, not elsewhere classified: Secondary | ICD-10-CM

## 2022-01-05 DIAGNOSIS — R2689 Other abnormalities of gait and mobility: Secondary | ICD-10-CM | POA: Diagnosis not present

## 2022-01-05 DIAGNOSIS — M6281 Muscle weakness (generalized): Secondary | ICD-10-CM | POA: Diagnosis not present

## 2022-01-05 DIAGNOSIS — M25552 Pain in left hip: Secondary | ICD-10-CM | POA: Diagnosis not present

## 2022-01-05 NOTE — Therapy (Signed)
OUTPATIENT PHYSICAL THERAPY TREATMENT NOTE   Patient Name: James Gallegos MRN: [id number] DOB:September 13, 1950, 71 y.o., male Today's Date: 01/05/2022  PCP: Eunice Blase, MD REFERRING PROVIDER: Mcarthur Rossetti, MD  END OF SESSION:   PT End of Session - 01/05/22 0933     Visit Number 7    Number of Visits 20    Date for PT Re-Evaluation 02/12/22    Authorization Type [employer]    Authorization Time Period $15 Co-pay    Progress Note Due on Visit 10    PT Start Time 0930    PT Stop Time 1012    PT Time Calculation (min) 42 min    Activity Tolerance Patient tolerated treatment well;Patient limited by pain;Patient limited by fatigue    Behavior During Therapy [facility] for tasks assessed/performed               Past Medical History:  Diagnosis Date   Arthritis    back , R foot -   Deep vein thrombosis ([place])    after knee surgery in 2004   Diabetes mellitus without complication ([place])    Hypertension    Murmur, cardiac    pt. reports its difficult to auscultate    Sleep apnea    CPAP- last study- 2014 cpap set on 13 per pt   Wears glasses 11/20/2020   for reading   Past Surgical History:  Procedure Laterality Date   ANTERIOR CERVICAL DECOMP/DISCECTOMY FUSION N/A 04/14/2020   Procedure: ANTERIOR CERVICAL DECOMPRESSION/DISCECTOMY FUSION, INTERBODY PROSTHESIS, PLATE/SCREWS CERVICAL THREE-FOUR,CERVICAL FOUR-FIVE,CERVICAL SIX-SEVEN;  Surgeon: Newman Pies, MD;  Location: [place];  Service: Neurosurgery;  Laterality: N/A;  anterior   CERVICAL SPINE SURGERY N/A 1990   C5-6 discectomy plates and screws in neck per pt   colonscopy  2017   polyps removed per pt follow up in 5 years   FINGER SURGERY Right 2008   x 3 index finger   KNEE ARTHROSCOPY Bilateral    L 2004, R 2005   MASS EXCISION Right 01/18/2017   Procedure: EXCISION BENIGN RIGHT NECK MASS;  Surgeon: Rozetta Nunnery, MD;  Location: [place];  Service: ENT;  Laterality: Right;    MASS EXCISION Bilateral 03/23/2019   Procedure: EXCISIONS OF RIGHT CHEEK LESION, RIGHT POSTERIOR NECK LIPOMA  AND LEFT NECK NODULE;  Surgeon: Rozetta Nunnery, MD;  Location: [place];  Service: ENT;  Laterality: Bilateral;   [place] Left 12/18/2021   Procedure: LEFT TOTAL HIP ARTHROPLASTY ANTERIOR APPROACH;  Surgeon: Mcarthur Rossetti, MD;  Location: [facility];  Service: Orthopedics;  Laterality: Left;   TOTAL KNEE ARTHROPLASTY Right 06/01/2016   Procedure: TOTAL KNEE ARTHROPLASTY;  Surgeon: [name], MD;  Location: [place];  Service: Orthopedics;  Laterality: Right;   TOTAL KNEE ARTHROPLASTY Left 07/25/2018   Procedure: LEFT TOTAL KNEE ARTHROPLASTY;  Surgeon: [name], MD;  Location: [place];  Service: Orthopedics;  Laterality: Left;   ULNAR NERVE TRANSPOSITION Left 11/25/2020   Procedure: ulnar nerve decompression left elbow;  Surgeon: [name], MD;  Location: [place];  Service: Orthopedics;  Laterality: Left;   Patient Active Problem List   Diagnosis Date Noted   Status post total replacement of left hip 12/18/2021   Unilateral primary osteoarthritis, left hip 11/03/2021   Pain in left hip 09/03/2021   Neuropathy, 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   Cervical spondylosis with myelopathy and radiculopathy 04/14/2020   Impingement syndrome  of right shoulder 10/10/2019   Low back pain 10/10/2019   Irritation of ulnar nerve, left 08/28/2019   Class 2 obesity due to excess calories without serious comorbidity with body mass index (BMI) of 39.0 to 39.9 in adult 01/08/2019   Pain in right elbow 12/06/2018   Primary osteoarthritis of left knee 07/25/2018   History of left knee replacement 07/25/2018   Right foot pain 06/22/2018   Acute left-sided low back pain with left-sided sciatica 06/22/2018   Osteoarthritis 11/21/2017   Hyperlipidemia 11/21/2017   Low testosterone 09/26/2016   Healthcare maintenance  09/26/2016   S/P total knee replacement using cement, right 06/01/2016   Sleep apnea 05/19/2016   Hypertension 05/19/2016    REFERRING DIAG: M16.12 (ICD-10-CM) - Unilateral primary osteoarthritis, left hip & M25.552 (ICD-10-CM) - Pain in left hip  THERAPY DIAG:  Muscle weakness (generalized)  Stiffness of left hip, not elsewhere classified  Pain in left hip  Other abnormalities of gait and mobility  Unsteadiness on feet  Rationale for Evaluation and Treatment Rehabilitation  PERTINENT HISTORY: OA, neuropathy, cervical spondylosis, LBP with left side sciatica, obesity, Lt '20 & Rt '17 TKA, HTN, DVT after knee sg, cardiac murmur,   PRECAUTIONS: Anterior hip  SUBJECTIVE:  hip is still sore  PAIN: Are you having pain? Yes: NPRS scale: 1-2/10 Pain location: lateral left hip around incision Pain description: ache Aggravating factors: trying to move LE Relieving factors: not moving my leg   OBJECTIVE: (objective measures completed at initial evaluation unless otherwise dated)   DIAGNOSTIC FINDINGS: 12/18/2021  There has been interval left total hip replacement. The prosthetic components are in near anatomical alignment. Soft tissue emphysema and skin staples. Thin linear lucency extending from the acetabulum inferiorly into the ischium and is suspicious for a nondisplaced fracture line.   PATIENT SURVEYS:  12/21/21 FOTO  40% functional, target 61%   PALPATION: Tender to palpation laterally and anterior of incision to greater trochanter Tightness on lateral hip to mid thigh   LOWER EXTREMITY ROM:   ROM P:passive  A:active Left eval Left 12/28/21 Left 01/05/22 Right 01/05/22  Hip flexion   P: 93 A: 85 standing A: 87 standing  Hip extension Supine P: -19* P:-5    Hip abduction       Hip adduction       Hip internal rotation       Hip external rotation       Knee flexion       Knee extension       Ankle dorsiflexion       Ankle plantarflexion       Ankle inversion        Ankle eversion        (Blank rows = not tested)   LOWER EXTREMITY MMT:   MMT Right eval Left eval Left 12/28/21  Hip flexion   2+/5 3- (can lift against gravity in standing but not yet in supine)  Hip extension   Standing & functional 3-/5   Hip abduction   Standing & functional 3-/5   Hip adduction       Hip internal rotation       Hip external rotation       Knee flexion       Knee extension       Ankle dorsiflexion       Ankle plantarflexion       Ankle inversion       Ankle eversion        (  Blank rows = not tested)     FUNCTIONAL TESTS:  Sit to & from stand requires use of BUEs on armrests with limited hip flexion and RW to stabalize upon arising.  Pt requires mod to max A for LLE in & out of bed.    GAIT: Distance walked: 166ft Assistive device utilized: Environmental consultant - 2 wheeled Level of assistance; verbal cues (supervision) Comments: stiff gait with reduced L hip extension, hip hiking & vaulting to clear LLE, antalgic gait with decreased stance LLE     TODAY'S TREATMENT: 01/05/22 Therex: NuStep L6 x 8 min Standing marching 2x10 bil Squats 2x10 RDL 10# KB 2x10 Calf raises x20 reps Seated hamstring stretch 2x30 sec bil Sit to/from stand x 10 reps Step ups forward and lateral leading with LLE 2x10; bil UE support Leg Press 75# 3x10; LLE only 25# 3x10   01/01/22 Therex: NuStep L6 x 8 min Leg Press DL 56# 3x10, then left leg only 25# 2X10 Seated LAQ 4# 2X15 on left Seated SLR on left, 2 sets of 5 in smaller ROM as tolerated Sit to/from stand 2x 10 reps from slightly elevated mat and no UE support March walking 3 round trips at [place] with one UE support Tandem walking with one UE support 3 round trips at counter top.  12/30/21 Therex: NuStep L7 x 8 min Leg Press 50# 3x10 Gastroc stretch on slant board 3x30 sec Seated Lt hamstring stretc 3x30 sec bil Sit to/from stand x 10 reps, min UE support Seated AA marching (AA concentric, A eccentric) on Lt x 10  reps Standing SLR flexion, abduction, extension x10 reps bil - decr UE support when standing on LLE Tandem walking with bil UE support in // bars x 3 laps  12/28/21 -Nu step L5 X 8 min UE/LE -Leg press DL 31# 2X15  -Seated LAQ 3#  3X10 on left -Standing marches with UE support X 15 bilat -Standing hip abd with UE support X 15 bilat -Sit to stands from slightly elevated mat X 10 with UE support PRN -Supine left hip flexion marches 2X10 in hookling, eccentrics (up using assist from strap, then no help from strap coming down)  -Supine hip add ball squeeze 5 sec X10 -Supine clams blue X 20 -Standing hip flexor stretch with UE support 30 sec X 3 -Gait with SPC 50 feet with supervsion  Manual therapy -Lt hip PROM to tolerance, manual hip flexor stretching    PATIENT EDUCATION:  Education details: Access Code: [email or web address] Person educated: Patient Education method: Explanation, Demonstration, Tactile cues, Verbal cues, Handout Education comprehension: verbalized understanding, returned demonstration, verbal cues required, and tactile cues required     HOME EXERCISE PROGRAM: Access Code: [email or web address] URL: [email or web address] Date: 01/05/2022 Prepared by: Faustino Congress  Exercises - Supine Heel Slides  - 3 x daily - 7 x weekly - 2-3 sets - 10 reps - Standing hip abduction alternating legs  - 3 x daily - 7 x weekly - 2-3 sets - 10 reps - Standing hip extension alternating legs  - 3 x daily - 7 x weekly - 2-3 sets - 10 reps - Standing Marching  - 3 x daily - 7 x weekly - 2-3 sets - 10 reps - 5 seconds hold - Sit to Stand  - 3 x daily - 7 x weekly - 2-3 sets - 10 reps - Standing Bilateral Gastroc Stretch with Step  - 2 x daily - 7 x weekly - 1 sets - 3 reps - 30 sec hold -  Seated Hamstring Stretch  - 2 x daily - 7 x weekly - 1 sets - 3 reps - 30 sec hold - Squat with Counter Support  - 1 x daily - 7 x weekly - 2 sets - 10 reps - Kettlebell Deadlift  - 1 x daily - 7 x weekly  - 2 sets - 10 reps - Heel Raises with Counter Support  - 1 x daily - 7 x weekly - 1 sets - 20 reps   ASSESSMENT:   CLINICAL IMPRESSION: Pt tolerated session well today meeting all STGs and 1 LTG.  Anticipate holding PT at next visit to work on [employer] and community exercise.     OBJECTIVE IMPAIRMENTS Abnormal gait, decreased activity tolerance, decreased balance, decreased endurance, decreased knowledge of use of DME, decreased mobility, decreased ROM, decreased strength, increased edema, impaired flexibility, postural dysfunction, and pain.    ACTIVITY LIMITATIONS lifting, bending, sitting, standing, squatting, sleeping, stairs, transfers, bed mobility, and locomotion level   PARTICIPATION LIMITATIONS: driving, community activity, occupation, and yard work   PERSONAL FACTORS Fitness and 1-2 comorbidities: see pertinent history above  are also affecting patient's functional outcome.    REHAB POTENTIAL: Good   CLINICAL DECISION MAKING: Stable/uncomplicated   EVALUATION COMPLEXITY: Low     GOALS: Goals reviewed with patient? Yes   SHORT TERM GOALS: Target date: 01/15/2022  Patient independent and verbalizes compliance with initial HEP. Baseline: SEE OBJECTIVE DATA Goal status: MET 01/05/22   2.  Patient reports 50% improvement in left hip pain. Baseline: SEE OBJECTIVE DATA Goal status: MET 01/05/22   3.  Patient is able to lift LLE for all bed mobility without manual assistance Baseline: see objective data Goal status: MET 01/05/22     LONG TERM GOALS: Target date: 02/12/2022    Patient will improve FOTO score to 61% Baseline: SEE OBJECTIVE DATA Goal status: INITIAL   2.  Patient reports L hip pain </= 2/10 with standing, sitting, and gait activities. Baseline: SEE OBJECTIVE DATA Goal status: INITIAL   3.  Left hip AROM standing within 10* of RLE.  Baseline: SEE OBJECTIVE DATA Goal status: MET 01/05/22   4.  Left hip PROM within 10* of RLE.  Baseline: SEE OBJECTIVE  DATA Goal status: INITIAL   5.  Patient ambulates >500' community distances including negotiating ramps, curbs without device & stairs single rail independently. Baseline: SEE OBJECTIVE DATA Goal status: INITIAL   6.  Patient verbalizes and demonstrates ability to sit and stand for an 8 hour day to return to work without increases in pain. Baseline: SEE OBJECTIVE DATA Goal status: MET 01/05/22     PLAN: PT FREQUENCY:  3x /week for 4 weeks, then 2x /week for 4 weeks   PT DURATION: 8 weeks   PLANNED INTERVENTIONS: Therapeutic exercises, Therapeutic activity, Neuromuscular re-education, Balance training, Gait training, Patient/Family education, Joint mobilization, Stair training, DME instructions, Electrical stimulation, Cryotherapy, Moist heat, scar mobilization, Taping, Vasopneumatic device, Manual therapy, and physical performance testing   PLAN FOR NEXT SESSION: check remaining goals. Plan to hold PT   Faustino Congress, PT, DPT 01/05/2022, 10:42 AM

## 2022-01-06 ENCOUNTER — Encounter: Payer: PPO | Admitting: Physical Therapy

## 2022-01-06 DIAGNOSIS — G4733 Obstructive sleep apnea (adult) (pediatric): Secondary | ICD-10-CM | POA: Diagnosis not present

## 2022-01-06 DIAGNOSIS — R269 Unspecified abnormalities of gait and mobility: Secondary | ICD-10-CM | POA: Diagnosis not present

## 2022-01-06 DIAGNOSIS — M1711 Unilateral primary osteoarthritis, right knee: Secondary | ICD-10-CM | POA: Diagnosis not present

## 2022-01-07 ENCOUNTER — Encounter: Payer: Self-pay | Admitting: Physical Therapy

## 2022-01-07 ENCOUNTER — Ambulatory Visit: Payer: PPO | Admitting: Physical Therapy

## 2022-01-07 DIAGNOSIS — M6281 Muscle weakness (generalized): Secondary | ICD-10-CM

## 2022-01-07 DIAGNOSIS — R2689 Other abnormalities of gait and mobility: Secondary | ICD-10-CM | POA: Diagnosis not present

## 2022-01-07 DIAGNOSIS — M25552 Pain in left hip: Secondary | ICD-10-CM | POA: Diagnosis not present

## 2022-01-07 DIAGNOSIS — R2681 Unsteadiness on feet: Secondary | ICD-10-CM | POA: Diagnosis not present

## 2022-01-07 DIAGNOSIS — M25652 Stiffness of left hip, not elsewhere classified: Secondary | ICD-10-CM | POA: Diagnosis not present

## 2022-01-07 NOTE — Therapy (Addendum)
OUTPATIENT PHYSICAL THERAPY TREATMENT NOTE DISCHARGE SUMMARY   Patient Name: James Gallegos MRN: [id number] DOB:[birth year], 71 y.o., male Today's Date: 01/07/2022  PCP: Eunice Blase, MD REFERRING PROVIDER: Mcarthur Rossetti, MD  END OF SESSION:   PT End of Session - 01/07/22 1019     Visit Number 8    Number of Visits 20    Date for PT Re-Evaluation 02/12/22    Authorization Type [employer]    Authorization Time Period $15 Co-pay    Progress Note Due on Visit 10    PT Start Time 1015    PT Stop Time 1045    PT Time Calculation (min) 30 min    Activity Tolerance Patient tolerated treatment well;Patient limited by pain;Patient limited by fatigue    Behavior During Therapy [facility] for tasks assessed/performed                Past Medical History:  Diagnosis Date   Arthritis    back , R foot -   Deep vein thrombosis ([place])    after knee surgery in 2004   Diabetes mellitus without complication ([place])    Hypertension    Murmur, cardiac    pt. reports its difficult to auscultate    Sleep apnea    CPAP- last study- 2014 cpap set on 13 per pt   Wears glasses 11/20/2020   for reading   Past Surgical History:  Procedure Laterality Date   ANTERIOR CERVICAL DECOMP/DISCECTOMY FUSION N/A 04/14/2020   Procedure: ANTERIOR CERVICAL DECOMPRESSION/DISCECTOMY FUSION, INTERBODY PROSTHESIS, PLATE/SCREWS CERVICAL THREE-FOUR,CERVICAL FOUR-FIVE,CERVICAL SIX-SEVEN;  Surgeon: Newman Pies, MD;  Location: [place];  Service: Neurosurgery;  Laterality: N/A;  anterior   CERVICAL SPINE SURGERY N/A 1990   C5-6 discectomy plates and screws in neck per pt   colonscopy  2017   polyps removed per pt follow up in 5 years   FINGER SURGERY Right 2008   x 3 index finger   KNEE ARTHROSCOPY Bilateral    L 2004, R 2005   MASS EXCISION Right 01/18/2017   Procedure: EXCISION BENIGN RIGHT NECK MASS;  Surgeon: Rozetta Nunnery, MD;  Location: [place];  Service:  ENT;  Laterality: Right;   MASS EXCISION Bilateral 03/23/2019   Procedure: EXCISIONS OF RIGHT CHEEK LESION, RIGHT POSTERIOR NECK LIPOMA  AND LEFT NECK NODULE;  Surgeon: Rozetta Nunnery, MD;  Location: [place];  Service: ENT;  Laterality: Bilateral;   [place] Left 12/18/2021   Procedure: LEFT TOTAL HIP ARTHROPLASTY ANTERIOR APPROACH;  Surgeon: Mcarthur Rossetti, MD;  Location: [facility];  Service: Orthopedics;  Laterality: Left;   TOTAL KNEE ARTHROPLASTY Right 06/01/2016   Procedure: TOTAL KNEE ARTHROPLASTY;  Surgeon: [name], MD;  Location: [place];  Service: Orthopedics;  Laterality: Right;   TOTAL KNEE ARTHROPLASTY Left 07/25/2018   Procedure: LEFT TOTAL KNEE ARTHROPLASTY;  Surgeon: [name], MD;  Location: [place];  Service: Orthopedics;  Laterality: Left;   ULNAR NERVE TRANSPOSITION Left 11/25/2020   Procedure: ulnar nerve decompression left elbow;  Surgeon: [name], MD;  Location: [place];  Service: Orthopedics;  Laterality: Left;   Patient Active Problem List   Diagnosis Date Noted   Status post total replacement of left hip 12/18/2021   Unilateral primary osteoarthritis, left hip 11/03/2021   Pain in left hip 09/03/2021   Neuropathy, 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   Cervical spondylosis with myelopathy and radiculopathy 04/14/2020  Impingement syndrome of right shoulder 10/10/2019   Low back pain 10/10/2019   Irritation of ulnar nerve, left 08/28/2019   Class 2 obesity due to excess calories without serious comorbidity with body mass index (BMI) of 39.0 to 39.9 in adult 01/08/2019   Pain in right elbow 12/06/2018   Primary osteoarthritis of left knee 07/25/2018   History of left knee replacement 07/25/2018   Right foot pain 06/22/2018   Acute left-sided low back pain with left-sided sciatica 06/22/2018   Osteoarthritis 11/21/2017   Hyperlipidemia 11/21/2017   Low testosterone 09/26/2016    Healthcare maintenance 09/26/2016   S/P total knee replacement using cement, right 06/01/2016   Sleep apnea 05/19/2016   Hypertension 05/19/2016    REFERRING DIAG: M16.12 (ICD-10-CM) - Unilateral primary osteoarthritis, left hip & M25.552 (ICD-10-CM) - Pain in left hip  THERAPY DIAG:  Muscle weakness (generalized)  Stiffness of left hip, not elsewhere classified  Pain in left hip  Other abnormalities of gait and mobility  Unsteadiness on feet  Rationale for Evaluation and Treatment Rehabilitation  PERTINENT HISTORY: OA, neuropathy, cervical spondylosis, LBP with left side sciatica, obesity, Lt '20 & Rt '17 TKA, HTN, DVT after knee sg, cardiac murmur,   PRECAUTIONS: Anterior hip  SUBJECTIVE:  doing well, hip is still sore but pain minimal  PAIN: Are you having pain? Yes: NPRS scale: 1-2/10 Pain location: lateral left hip around incision Pain description: ache Aggravating factors: trying to move LE Relieving factors: not moving my leg   OBJECTIVE: (objective measures completed at initial evaluation unless otherwise dated)   DIAGNOSTIC FINDINGS: 12/18/2021  There has been interval left total hip replacement. The prosthetic components are in near anatomical alignment. Soft tissue emphysema and skin staples. Thin linear lucency extending from the acetabulum inferiorly into the ischium and is suspicious for a nondisplaced fracture line.   PATIENT SURVEYS:  12/21/21 FOTO  40% functional, target 61% 01/07/22 FOTO 70   PALPATION: Tender to palpation laterally and anterior of incision to greater trochanter Tightness on lateral hip to mid thigh   LOWER EXTREMITY ROM:   ROM P:passive  A:active Left eval Left 12/28/21 Left 01/05/22 Right 01/05/22 Left 01/07/22 Right 01/07/22  Hip flexion   P: 93 A: 85 standing A: 87 standing P: 108  supine P:  110 supine  Hip extension Supine P: -19* P:-5   P: 11 prone    (Blank rows = not tested)   LOWER EXTREMITY MMT:   MMT Right eval  Left eval Left 12/28/21  Hip flexion   2+/5 3- (can lift against gravity in standing but not yet in supine)  Hip extension   Standing & functional 3-/5   Hip abduction   Standing & functional 3-/5    (Blank rows = not tested)     FUNCTIONAL TESTS:  Sit to & from stand requires use of BUEs on armrests with limited hip flexion and RW to stabalize upon arising.  Pt requires mod to max A for LLE in & out of bed.    GAIT: Distance walked: 134ft Assistive device utilized: Environmental consultant - 2 wheeled Level of assistance; verbal cues (supervision) Comments: stiff gait with reduced L hip extension, hip hiking & vaulting to clear LLE, antalgic gait with decreased stance LLE     TODAY'S TREATMENT: 01/07/22 Therex: NuStep L6 x 8 min PROM measurements noted  Gait Negotiated ramp/curb without device independently Mod I with stairs and single handrail; reciprocal pattern   01/05/22 Therex: NuStep L6 x 8 min Standing  marching 2x10 bil Squats 2x10 RDL 10# KB 2x10 Calf raises x20 reps Seated hamstring stretch 2x30 sec bil Sit to/from stand x 10 reps Step ups forward and lateral leading with LLE 2x10; bil UE support Leg Press 75# 3x10; LLE only 25# 3x10   01/01/22 Therex: NuStep L6 x 8 min Leg Press DL 56# 3x10, then left leg only 25# 2X10 Seated LAQ 4# 2X15 on left Seated SLR on left, 2 sets of 5 in smaller ROM as tolerated Sit to/from stand 2x 10 reps from slightly elevated mat and no UE support March walking 3 round trips at [place] with one UE support Tandem walking with one UE support 3 round trips at counter top.  12/30/21 Therex: NuStep L7 x 8 min Leg Press 50# 3x10 Gastroc stretch on slant board 3x30 sec Seated Lt hamstring stretc 3x30 sec bil Sit to/from stand x 10 reps, min UE support Seated AA marching (AA concentric, A eccentric) on Lt x 10 reps Standing SLR flexion, abduction, extension x10 reps bil - decr UE support when standing on LLE Tandem walking with bil UE  support in // bars x 3 laps  12/28/21 -Nu step L5 X 8 min UE/LE -Leg press DL 31# 2X15  -Seated LAQ 3#  3X10 on left -Standing marches with UE support X 15 bilat -Standing hip abd with UE support X 15 bilat -Sit to stands from slightly elevated mat X 10 with UE support PRN -Supine left hip flexion marches 2X10 in hookling, eccentrics (up using assist from strap, then no help from strap coming down)  -Supine hip add ball squeeze 5 sec X10 -Supine clams blue X 20 -Standing hip flexor stretch with UE support 30 sec X 3 -Gait with SPC 50 feet with supervsion  Manual therapy -Lt hip PROM to tolerance, manual hip flexor stretching    PATIENT EDUCATION:  Education details: Access Code: [email or web address] Person educated: Patient Education method: Explanation, Demonstration, Tactile cues, Verbal cues, Handout Education comprehension: verbalized understanding, returned demonstration, verbal cues required, and tactile cues required     HOME EXERCISE PROGRAM: Access Code: [email or web address] URL: [email or web address] Date: 01/05/2022 Prepared by: Faustino Congress  Exercises - Supine Heel Slides  - 3 x daily - 7 x weekly - 2-3 sets - 10 reps - Standing hip abduction alternating legs  - 3 x daily - 7 x weekly - 2-3 sets - 10 reps - Standing hip extension alternating legs  - 3 x daily - 7 x weekly - 2-3 sets - 10 reps - Standing Marching  - 3 x daily - 7 x weekly - 2-3 sets - 10 reps - 5 seconds hold - Sit to Stand  - 3 x daily - 7 x weekly - 2-3 sets - 10 reps - Standing Bilateral Gastroc Stretch with Step  - 2 x daily - 7 x weekly - 1 sets - 3 reps - 30 sec hold - Seated Hamstring Stretch  - 2 x daily - 7 x weekly - 1 sets - 3 reps - 30 sec hold - Squat with Counter Support  - 1 x daily - 7 x weekly - 2 sets - 10 reps - Kettlebell Deadlift  - 1 x daily - 7 x weekly - 2 sets - 10 reps - Heel Raises with Counter Support  - 1 x daily - 7 x weekly - 1 sets - 20 reps   ASSESSMENT:   CLINICAL  IMPRESSION: Pt has met all goals at this time.  He continues to  be limited by Lt knee pain and bruising that likely occurred during surgery.  Advised he talk with MD if symptoms worsen or don't improve in 4-6 weeks.  At this time will hold PT for 30 days.    OBJECTIVE IMPAIRMENTS Abnormal gait, decreased activity tolerance, decreased balance, decreased endurance, decreased knowledge of use of DME, decreased mobility, decreased ROM, decreased strength, increased edema, impaired flexibility, postural dysfunction, and pain.    ACTIVITY LIMITATIONS lifting, bending, sitting, standing, squatting, sleeping, stairs, transfers, bed mobility, and locomotion level   PARTICIPATION LIMITATIONS: driving, community activity, occupation, and yard work   PERSONAL FACTORS Fitness and 1-2 comorbidities: see pertinent history above  are also affecting patient's functional outcome.    REHAB POTENTIAL: Good   CLINICAL DECISION MAKING: Stable/uncomplicated   EVALUATION COMPLEXITY: Low     GOALS: Goals reviewed with patient? Yes   SHORT TERM GOALS: Target date: 01/15/2022  Patient independent and verbalizes compliance with initial HEP. Baseline: SEE OBJECTIVE DATA Goal status: MET 01/05/22   2.  Patient reports 50% improvement in left hip pain. Baseline: SEE OBJECTIVE DATA Goal status: MET 01/05/22   3.  Patient is able to lift LLE for all bed mobility without manual assistance Baseline: see objective data Goal status: MET 01/05/22     LONG TERM GOALS: Target date: 02/12/2022    Patient will improve FOTO score to 61% Baseline: SEE OBJECTIVE DATA Goal status: Met 01/07/22   2.  Patient reports L hip pain </= 2/10 with standing, sitting, and gait activities. Baseline: SEE OBJECTIVE DATA Goal status: MET 01/07/22   3.  Left hip AROM standing within 10* of RLE.  Baseline: SEE OBJECTIVE DATA Goal status: MET 01/05/22   4.  Left hip PROM within 10* of RLE.  Baseline: SEE OBJECTIVE DATA Goal status: MET  01/07/22    5.  Patient ambulates >500' community distances including negotiating ramps, curbs without device & stairs single rail independently. Baseline: SEE OBJECTIVE DATA Goal status: met 01/07/22   6.  Patient verbalizes and demonstrates ability to sit and stand for an 8 hour day to return to work without increases in pain. Baseline: SEE OBJECTIVE DATA Goal status: MET 01/05/22     PLAN: PT FREQUENCY:  3x /week for 4 weeks, then 2x /week for 4 weeks   PT DURATION: 8 weeks   PLANNED INTERVENTIONS: Therapeutic exercises, Therapeutic activity, Neuromuscular re-education, Balance training, Gait training, Patient/Family education, Joint mobilization, Stair training, DME instructions, Electrical stimulation, Cryotherapy, Moist heat, scar mobilization, Taping, Vasopneumatic device, Manual therapy, and physical performance testing   PLAN FOR NEXT SESSION: hold PT x 30 days; recert or d/c   Faustino Congress, PT, DPT 01/07/2022, 10:55 AM       PHYSICAL THERAPY DISCHARGE SUMMARY  Visits from Start of Care: 8  Current functional level related to goals / functional outcomes: See above   Remaining deficits: See above   Education / Equipment: HEP   Patient agrees to discharge. Patient goals were met. Patient is being discharged due to meeting the stated rehab goals.  Laureen Abrahams, PT, DPT 02/17/22 3:40 PM  [facility] [address] [place], [place], [address] Phone: [phone]   Fax:  [phone]

## 2022-01-08 ENCOUNTER — Encounter: Payer: PPO | Admitting: Rehabilitative and Restorative Service Providers"

## 2022-01-11 ENCOUNTER — Other Ambulatory Visit: Payer: Self-pay | Admitting: Orthopaedic Surgery

## 2022-01-11 ENCOUNTER — Other Ambulatory Visit (HOSPITAL_COMMUNITY): Payer: Self-pay

## 2022-01-11 MED ORDER — HYDROCODONE-ACETAMINOPHEN 5-325 MG PO TABS
1.0000 | ORAL_TABLET | Freq: Four times a day (QID) | ORAL | 0 refills | Status: DC | PRN
  Filled 2022-01-11: qty 30, 4d supply, fill #0

## 2022-01-12 ENCOUNTER — Encounter: Payer: PPO | Admitting: Physical Therapy

## 2022-01-14 ENCOUNTER — Encounter: Payer: PPO | Admitting: Physical Therapy

## 2022-01-19 ENCOUNTER — Encounter: Payer: PPO | Admitting: Physical Therapy

## 2022-01-21 ENCOUNTER — Encounter: Payer: PPO | Admitting: Physical Therapy

## 2022-01-26 ENCOUNTER — Encounter: Payer: PPO | Admitting: Physical Therapy

## 2022-01-28 ENCOUNTER — Encounter: Payer: PPO | Admitting: Physical Therapy

## 2022-02-01 ENCOUNTER — Ambulatory Visit (INDEPENDENT_AMBULATORY_CARE_PROVIDER_SITE_OTHER): Payer: PPO | Admitting: Orthopaedic Surgery

## 2022-02-01 ENCOUNTER — Encounter: Payer: Self-pay | Admitting: Orthopaedic Surgery

## 2022-02-01 DIAGNOSIS — Z96642 Presence of left artificial hip joint: Secondary | ICD-10-CM

## 2022-02-01 NOTE — Progress Notes (Signed)
The patient is 6 weeks status post a left total hip arthroplasty.  He has a previous left knee replacement done by Dr. Durward Fortes years ago.  He is doing well overall and he still has some soreness but he reports he has made good progress.  He is walking without assistive device and no significant limp.  He is an active 71 year old gentleman.  His left operative hip moves smoothly and fluidly with no issues at all.  At this point we will see him back in 6 months unless there are issues.  At that visit we will have a standing low AP pelvis and lateral of his left operative hip.  All questions and concerns were answered and addressed.

## 2022-02-02 ENCOUNTER — Encounter: Payer: PPO | Admitting: Physical Therapy

## 2022-02-04 DIAGNOSIS — D1801 Hemangioma of skin and subcutaneous tissue: Secondary | ICD-10-CM | POA: Diagnosis not present

## 2022-02-04 DIAGNOSIS — C44311 Basal cell carcinoma of skin of nose: Secondary | ICD-10-CM | POA: Diagnosis not present

## 2022-02-04 DIAGNOSIS — L814 Other melanin hyperpigmentation: Secondary | ICD-10-CM | POA: Diagnosis not present

## 2022-02-04 DIAGNOSIS — L821 Other seborrheic keratosis: Secondary | ICD-10-CM | POA: Diagnosis not present

## 2022-02-04 DIAGNOSIS — X32XXXS Exposure to sunlight, sequela: Secondary | ICD-10-CM | POA: Diagnosis not present

## 2022-02-04 DIAGNOSIS — D485 Neoplasm of uncertain behavior of skin: Secondary | ICD-10-CM | POA: Diagnosis not present

## 2022-02-06 DIAGNOSIS — M1711 Unilateral primary osteoarthritis, right knee: Secondary | ICD-10-CM | POA: Diagnosis not present

## 2022-02-06 DIAGNOSIS — G4733 Obstructive sleep apnea (adult) (pediatric): Secondary | ICD-10-CM | POA: Diagnosis not present

## 2022-02-06 DIAGNOSIS — R269 Unspecified abnormalities of gait and mobility: Secondary | ICD-10-CM | POA: Diagnosis not present

## 2022-03-02 DIAGNOSIS — M4712 Other spondylosis with myelopathy, cervical region: Secondary | ICD-10-CM | POA: Diagnosis not present

## 2022-03-09 ENCOUNTER — Telehealth: Payer: Self-pay | Admitting: *Deleted

## 2022-03-09 DIAGNOSIS — C44311 Basal cell carcinoma of skin of nose: Secondary | ICD-10-CM | POA: Diagnosis not present

## 2022-03-09 DIAGNOSIS — R269 Unspecified abnormalities of gait and mobility: Secondary | ICD-10-CM | POA: Diagnosis not present

## 2022-03-09 DIAGNOSIS — M1711 Unilateral primary osteoarthritis, right knee: Secondary | ICD-10-CM | POA: Diagnosis not present

## 2022-03-09 DIAGNOSIS — G4733 Obstructive sleep apnea (adult) (pediatric): Secondary | ICD-10-CM | POA: Diagnosis not present

## 2022-03-09 NOTE — Telephone Encounter (Signed)
(  Late entry for 02/01/22).Ortho bundle 30 day in person meeting completed.

## 2022-03-16 DIAGNOSIS — C44311 Basal cell carcinoma of skin of nose: Secondary | ICD-10-CM | POA: Diagnosis not present

## 2022-04-02 ENCOUNTER — Telehealth: Payer: Self-pay | Admitting: *Deleted

## 2022-04-02 NOTE — Telephone Encounter (Signed)
90 day Ortho bundle call completed. 

## 2022-04-08 DIAGNOSIS — M1711 Unilateral primary osteoarthritis, right knee: Secondary | ICD-10-CM | POA: Diagnosis not present

## 2022-04-08 DIAGNOSIS — R269 Unspecified abnormalities of gait and mobility: Secondary | ICD-10-CM | POA: Diagnosis not present

## 2022-04-08 DIAGNOSIS — G4733 Obstructive sleep apnea (adult) (pediatric): Secondary | ICD-10-CM | POA: Diagnosis not present

## 2022-04-15 ENCOUNTER — Encounter: Payer: Self-pay | Admitting: Orthopaedic Surgery

## 2022-04-15 ENCOUNTER — Ambulatory Visit: Payer: PPO | Admitting: Orthopaedic Surgery

## 2022-04-15 ENCOUNTER — Ambulatory Visit (INDEPENDENT_AMBULATORY_CARE_PROVIDER_SITE_OTHER): Payer: PPO

## 2022-04-15 ENCOUNTER — Other Ambulatory Visit (HOSPITAL_COMMUNITY): Payer: Self-pay

## 2022-04-15 DIAGNOSIS — M79604 Pain in right leg: Secondary | ICD-10-CM

## 2022-04-15 DIAGNOSIS — M25551 Pain in right hip: Secondary | ICD-10-CM

## 2022-04-15 MED ORDER — PREDNISONE 50 MG PO TABS
50.0000 mg | ORAL_TABLET | Freq: Every day | ORAL | 0 refills | Status: AC
  Filled 2022-04-15: qty 5, 5d supply, fill #0

## 2022-04-15 MED ORDER — METHOCARBAMOL 500 MG PO TABS
500.0000 mg | ORAL_TABLET | Freq: Four times a day (QID) | ORAL | 1 refills | Status: AC | PRN
  Filled 2022-04-15: qty 40, 10d supply, fill #0
  Filled 2022-06-15: qty 40, 10d supply, fill #1

## 2022-04-15 NOTE — Progress Notes (Signed)
The patient is a 71 year old gentleman well-known to me.  We actually replaced his left hip successfully in July of this year due to severe end-stage arthritis.  He has been developing right hip pain recently but really describes pain in the lower back to the right side and in the sciatic region and buttocks on the right side.  He denies any groin pain.  He denies any numbness and tingling in his feet.  He has had cervical spine surgery by Dr. Earle Gell with neurosurgery.  He actually saw Dr. Arnoldo Morale recently for his neck and follow-up but did not mention low back pain since he was not really hurting at the time.  He is not a diabetic.  He is an active individual.  There is been no change in bowel or bladder function.  On exam he walks without an assistive device and no significant limp.  He does have a positive straight leg raise on the right side.  His right hip exam is entirely normal.  He does have posterior element pain with flexion extension to the right side and sciatica as well.  An AP pelvis and lateral the right hip show normal-appearing hip on the right side with no significant arthritic changes.  The left hip replacement is in good position.  2 views lumbar spine show severe degenerative changes at every level of the lumbar spine.  There is loss of lordosis and degenerative scoliosis.  There is degenerative disc disease at multiple levels and posterior element arthritis.  His symptoms are definitely related to degenerative changes in his lumbar spine.  I am going to try 5 days of prednisone to see if this will help calm down the acute pain in his back as well as Robaxin.  He agrees with this treatment plan.  He does know to call us if things are worsening or this does not help.  My next step would be obtaining an MRI of his lumbar spine to assess for nerve compression.  All question concerns were answered and addressed.

## 2022-04-19 ENCOUNTER — Other Ambulatory Visit (HOSPITAL_COMMUNITY): Payer: Self-pay

## 2022-04-26 DIAGNOSIS — H40013 Open angle with borderline findings, low risk, bilateral: Secondary | ICD-10-CM | POA: Diagnosis not present

## 2022-05-09 DIAGNOSIS — G4733 Obstructive sleep apnea (adult) (pediatric): Secondary | ICD-10-CM | POA: Diagnosis not present

## 2022-05-09 DIAGNOSIS — M1711 Unilateral primary osteoarthritis, right knee: Secondary | ICD-10-CM | POA: Diagnosis not present

## 2022-05-09 DIAGNOSIS — R269 Unspecified abnormalities of gait and mobility: Secondary | ICD-10-CM | POA: Diagnosis not present

## 2022-05-11 DIAGNOSIS — L720 Epidermal cyst: Secondary | ICD-10-CM | POA: Diagnosis not present

## 2022-05-11 DIAGNOSIS — L309 Dermatitis, unspecified: Secondary | ICD-10-CM | POA: Diagnosis not present

## 2022-05-11 DIAGNOSIS — L905 Scar conditions and fibrosis of skin: Secondary | ICD-10-CM | POA: Diagnosis not present

## 2022-05-11 DIAGNOSIS — L821 Other seborrheic keratosis: Secondary | ICD-10-CM | POA: Diagnosis not present

## 2022-05-11 DIAGNOSIS — E669 Obesity, unspecified: Secondary | ICD-10-CM | POA: Diagnosis not present

## 2022-05-11 DIAGNOSIS — Z129 Encounter for screening for malignant neoplasm, site unspecified: Secondary | ICD-10-CM | POA: Diagnosis not present

## 2022-05-11 DIAGNOSIS — D229 Melanocytic nevi, unspecified: Secondary | ICD-10-CM | POA: Diagnosis not present

## 2022-06-08 DIAGNOSIS — G4733 Obstructive sleep apnea (adult) (pediatric): Secondary | ICD-10-CM | POA: Diagnosis not present

## 2022-06-08 DIAGNOSIS — M1711 Unilateral primary osteoarthritis, right knee: Secondary | ICD-10-CM | POA: Diagnosis not present

## 2022-06-08 DIAGNOSIS — R269 Unspecified abnormalities of gait and mobility: Secondary | ICD-10-CM | POA: Diagnosis not present

## 2022-07-02 ENCOUNTER — Other Ambulatory Visit (HOSPITAL_COMMUNITY): Payer: Self-pay

## 2022-07-02 ENCOUNTER — Other Ambulatory Visit (HOSPITAL_BASED_OUTPATIENT_CLINIC_OR_DEPARTMENT_OTHER): Payer: Self-pay

## 2022-07-02 MED ORDER — LOSARTAN POTASSIUM 100 MG PO TABS
100.0000 mg | ORAL_TABLET | Freq: Every day | ORAL | 3 refills | Status: DC
  Filled 2022-07-02: qty 90, 90d supply, fill #0
  Filled 2022-09-25: qty 90, 90d supply, fill #1
  Filled 2023-03-08: qty 90, 90d supply, fill #2
  Filled 2023-06-02: qty 90, 90d supply, fill #3

## 2022-08-04 ENCOUNTER — Other Ambulatory Visit: Payer: Self-pay

## 2022-08-04 ENCOUNTER — Ambulatory Visit (INDEPENDENT_AMBULATORY_CARE_PROVIDER_SITE_OTHER): Payer: PPO | Admitting: Orthopaedic Surgery

## 2022-08-04 ENCOUNTER — Ambulatory Visit (INDEPENDENT_AMBULATORY_CARE_PROVIDER_SITE_OTHER): Payer: PPO

## 2022-08-04 ENCOUNTER — Encounter: Payer: Self-pay | Admitting: Orthopaedic Surgery

## 2022-08-04 DIAGNOSIS — M25512 Pain in left shoulder: Secondary | ICD-10-CM | POA: Diagnosis not present

## 2022-08-04 DIAGNOSIS — G8929 Other chronic pain: Secondary | ICD-10-CM

## 2022-08-04 NOTE — Progress Notes (Signed)
The patient is a 72 year old gentleman who we have replaced his hip last year in terms of his right hip.  He had knee replacements as well.  He is also had cervical spine surgery.  He has had remote right shoulder rotator cuff surgery with the repair.  Since he was hospitalized has been having left shoulder pain and he says when he reaches behind him and overhead his shoulder is weak and it seems to be around the rotator cuff and the biceps tendon.  He said he noticed it most when he was reaching behind him in the hospital to get his CPAP machine.  On examination of his left shoulder he is using his deltoid abduct his shoulder and there is definitely weakness in the rotator cuff with rotation externally as well as abduction.  It does show deficits of the rotator cuff.  3 views of the left shoulder show the humeral head is slightly high riding to suggest a chronic rotator cuff tear.  The glenohumeral joint itself is well-maintained.  He there is definitely weakness in the rotator cuff and shoulder itself with the left shoulder.  An MRI is warranted of his left shoulder to assess the rotator cuff so we can make a better determination of what treatment options are and he would like this as well.  Will see him back once we have the MRI of his left shoulder.  All questions and concerns were addressed and answered.

## 2022-08-06 ENCOUNTER — Other Ambulatory Visit (HOSPITAL_BASED_OUTPATIENT_CLINIC_OR_DEPARTMENT_OTHER): Payer: Self-pay

## 2022-08-06 MED ORDER — OSELTAMIVIR PHOSPHATE 75 MG PO CAPS
75.0000 mg | ORAL_CAPSULE | Freq: Two times a day (BID) | ORAL | 0 refills | Status: DC
  Filled 2022-08-06: qty 10, 5d supply, fill #0

## 2022-08-11 DIAGNOSIS — Z85828 Personal history of other malignant neoplasm of skin: Secondary | ICD-10-CM | POA: Diagnosis not present

## 2022-08-11 DIAGNOSIS — L821 Other seborrheic keratosis: Secondary | ICD-10-CM | POA: Diagnosis not present

## 2022-08-11 DIAGNOSIS — D229 Melanocytic nevi, unspecified: Secondary | ICD-10-CM | POA: Diagnosis not present

## 2022-08-11 DIAGNOSIS — L814 Other melanin hyperpigmentation: Secondary | ICD-10-CM | POA: Diagnosis not present

## 2022-08-11 DIAGNOSIS — L570 Actinic keratosis: Secondary | ICD-10-CM | POA: Diagnosis not present

## 2022-08-19 ENCOUNTER — Encounter: Payer: Self-pay | Admitting: Radiology

## 2022-08-23 ENCOUNTER — Ambulatory Visit
Admission: RE | Admit: 2022-08-23 | Discharge: 2022-08-23 | Disposition: A | Discharge status: Home / Self Care | Payer: PPO | Source: Ambulatory Visit | Attending: Orthopaedic Surgery | Admitting: Orthopaedic Surgery

## 2022-08-23 DIAGNOSIS — M75122 Complete rotator cuff tear or rupture of left shoulder, not specified as traumatic: Secondary | ICD-10-CM | POA: Diagnosis not present

## 2022-08-23 DIAGNOSIS — M19012 Primary osteoarthritis, left shoulder: Secondary | ICD-10-CM | POA: Diagnosis not present

## 2022-08-23 DIAGNOSIS — M6258 Muscle wasting and atrophy, not elsewhere classified, other site: Secondary | ICD-10-CM | POA: Diagnosis not present

## 2022-08-23 DIAGNOSIS — G8929 Other chronic pain: Secondary | ICD-10-CM

## 2022-08-23 DIAGNOSIS — R531 Weakness: Secondary | ICD-10-CM | POA: Diagnosis not present

## 2022-09-08 ENCOUNTER — Encounter: Payer: Self-pay | Admitting: Orthopaedic Surgery

## 2022-09-08 ENCOUNTER — Ambulatory Visit (INDEPENDENT_AMBULATORY_CARE_PROVIDER_SITE_OTHER): Payer: PPO | Admitting: Orthopaedic Surgery

## 2022-09-08 DIAGNOSIS — M12812 Other specific arthropathies, not elsewhere classified, left shoulder: Secondary | ICD-10-CM | POA: Diagnosis not present

## 2022-09-08 DIAGNOSIS — M25512 Pain in left shoulder: Secondary | ICD-10-CM | POA: Diagnosis not present

## 2022-09-08 DIAGNOSIS — G8929 Other chronic pain: Secondary | ICD-10-CM

## 2022-09-08 NOTE — Progress Notes (Signed)
The patient is well-known patient of mine that he comes in today to go over an MRI of his left shoulder.  He is 72 years old and incredibly active.  He has had joint replacements that have done well in terms of hip and knee.  He has remote history of a right shoulder rotator cuff repair done by Dr. Durward Fortes that has done very well.  He had been continuing to experience left shoulder pain and weakness and had failed conservative treatment.  He is here today to go over an MRI of his left shoulder.  On exam he can still abduct his shoulder and externally rotate the shoulder.  There is weakness in the rotator cuff and there is pain once he gets past 90 degrees of abduction.  There is no blocks of the rotation.  The MRI of his left shoulder does show a full-thickness and retracted rotator cuff tear with significant atrophy of the muscles.  There is also moderate to severe arthritic changes of the glenohumeral joint.  Given the MRI findings, I would like to send him to my partner Dr. Marlou Sa to further assess his shoulder and to talk about the possibility of a reverse shoulder arthroplasty in the future.  The patient would like to consider that given the fact that he considers himself still young as to why based on the fact that he works and is very active and mobile.  We will work on getting an appointment to see Dr. Marlou Sa for his left shoulder.

## 2022-09-13 ENCOUNTER — Ambulatory Visit: Payer: PPO | Admitting: Orthopaedic Surgery

## 2022-09-14 ENCOUNTER — Ambulatory Visit (INDEPENDENT_AMBULATORY_CARE_PROVIDER_SITE_OTHER): Payer: PPO | Admitting: Orthopedic Surgery

## 2022-09-14 ENCOUNTER — Encounter: Payer: Self-pay | Admitting: Orthopedic Surgery

## 2022-09-14 DIAGNOSIS — M12812 Other specific arthropathies, not elsewhere classified, left shoulder: Secondary | ICD-10-CM | POA: Diagnosis not present

## 2022-09-14 NOTE — Progress Notes (Signed)
Office Visit Note   Patient: James Gallegos           Date of Birth: Aug 22, 1950           MRN: [id number] Visit Date: 09/14/2022 Requested by: Eunice Blase, MD [address] [place],  [place] [address] PCP: Eunice Blase, MD  Subjective: Chief Complaint  Patient presents with   Left Shoulder - Pain    HPI: James Gallegos is a 72 y.o. male who presents to the office reporting left shoulder pain which has worsened over the past several months.  Patient reports pain is a bigger problem than weakness.  Activities of daily living including yard work type activities are very painful for him.  Does report daily pain as well as pain which is sometimes affects his sleep.  Has tried an injection in the remote past which did not help much.  Has tried taking medication without much relief.  Has a history of right shoulder rotator cuff surgery and he has done well with that surgery.  Diabetes is under good control.  Does have his wife at home.  Along with the grandson.              ROS: All systems reviewed are negative as they relate to the chief complaint within the history of present illness.  Patient denies fevers or chills.  Assessment & Plan: Visit Diagnoses:  1. Rotator cuff arthropathy of left shoulder     Plan: Impression is left shoulder rotator cuff arthropathy with irreparable supraspinatus tear and glenohumeral joint arthritis.  Plan is reverse shoulder replacement.  With the use of imaging studies models and guides the rationale of reverse shoulder replacement is discussed with the patient.  Plan for thin cut CT scan for patient specific instrumentation followed by reverse shoulder replacement sometime in June.  Patient understands the risk and benefits of the surgery which are similar to those that were present for his hip replacement.  They include not limited to infection nerve vessel damage dislocation potential need for revision and unique to the shoulder would be  shoulder stiffness.  He does have a good range of motion currently but it is painful.  I think this would primarily be a pain relieving operation for him.  Should maintain the range of motion that he has.  All questions answered  Follow-Up Instructions: No follow-ups on file.   Orders:  No orders of the defined types were placed in this encounter.  No orders of the defined types were placed in this encounter.     Procedures: No procedures performed   Clinical Data: No additional findings.  Objective: Vital Signs: There were no vitals taken for this visit.  Physical Exam:  Constitutional: Patient appears well-developed HEENT:  Head: Normocephalic Eyes:EOM are normal Neck: Normal range of motion Cardiovascular: Normal rate Pulmonary/chest: Effort normal Neurologic: Patient is alert Skin: Skin is warm Psychiatric: Patient has normal mood and affect  Ortho Exam: Ortho exam demonstrates forward flexion and abduction both above 90 degrees.  Deltoid fires.  He has good subscap and infraspinatus strength but supraspinatus strength is a little weaker on the left than the right.  Does have painful crepitus with forward flexion as well as internal/external rotation at 90 degrees of abduction.  No discrete AC joint tenderness is present.  O'Brien's testing is positive.  Tenderness in the bicipital groove.  Coarseness with internal and external rotation at 90 degrees of abduction.  Specialty Comments:  No specialty comments  available.  Imaging: No results found.   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   Cervical spondylosis with myelopathy and radiculopathy 04/14/2020   Impingement syndrome of right shoulder 10/10/2019   Low back pain 10/10/2019   Irritation of ulnar nerve, left 08/28/2019   Class 2 obesity due to excess calories  without serious comorbidity with body mass index (BMI) of 39.0 to 39.9 in adult 01/08/2019   Pain in right elbow 12/06/2018   Primary osteoarthritis of left knee 07/25/2018   History of left knee replacement 07/25/2018   Right foot pain 06/22/2018   Acute left-sided low back pain with left-sided sciatica 06/22/2018   Osteoarthritis 11/21/2017   Hyperlipidemia 11/21/2017   Low testosterone 09/26/2016   Healthcare maintenance 09/26/2016   S/P total knee replacement using cement, right 06/01/2016   Sleep apnea 05/19/2016   Hypertension 05/19/2016   Past Medical History:  Diagnosis Date   Arthritis    back , R foot -   Deep vein thrombosis ([place])    after knee surgery in 2004   Diabetes mellitus without complication ([place])    Hypertension    Murmur, cardiac    pt. reports its difficult to auscultate    Sleep apnea    CPAP- last study- 2014 cpap set on 13 per pt   Wears glasses 11/20/2020   for reading    Family History  Problem Relation Age of Onset   Dementia Mother    Alzheimer's disease Father    Healthy Brother    Colon cancer Neg Hx    Esophageal cancer Neg Hx    Rectal cancer Neg Hx    Stomach cancer Neg Hx    Cancer Neg Hx    Diabetes Neg Hx     Past Surgical History:  Procedure Laterality Date   ANTERIOR CERVICAL DECOMP/DISCECTOMY FUSION N/A 04/14/2020   Procedure: ANTERIOR CERVICAL DECOMPRESSION/DISCECTOMY FUSION, INTERBODY PROSTHESIS, PLATE/SCREWS CERVICAL THREE-FOUR,CERVICAL FOUR-FIVE,CERVICAL SIX-SEVEN;  Surgeon: Newman Pies, MD;  Location: [facility];  Service: Neurosurgery;  Laterality: N/A;  anterior   CERVICAL SPINE SURGERY N/A 1990   C5-6 discectomy plates and screws in neck per pt   colonscopy  2017   polyps removed per pt follow up in 5 years   FINGER SURGERY Right 2008   x 3 index finger   KNEE ARTHROSCOPY Bilateral    L 2004, R 2005   MASS EXCISION Right 01/18/2017   Procedure: EXCISION BENIGN RIGHT NECK MASS;  Surgeon: Rozetta Nunnery, MD;   Location: [facility];  Service: ENT;  Laterality: Right;   MASS EXCISION Bilateral 03/23/2019   Procedure: EXCISIONS OF RIGHT CHEEK LESION, RIGHT POSTERIOR NECK LIPOMA  AND LEFT NECK NODULE;  Surgeon: Rozetta Nunnery, MD;  Location: [facility];  Service: ENT;  Laterality: Bilateral;   [place] Left 12/18/2021   Procedure: LEFT TOTAL HIP ARTHROPLASTY ANTERIOR APPROACH;  Surgeon: Mcarthur Rossetti, MD;  Location: [facility];  Service: Orthopedics;  Laterality: Left;   TOTAL KNEE ARTHROPLASTY Right 06/01/2016   Procedure: TOTAL KNEE ARTHROPLASTY;  Surgeon: [name], MD;  Location: [facility];  Service: Orthopedics;  Laterality: Right;   TOTAL KNEE ARTHROPLASTY Left 07/25/2018   Procedure: LEFT TOTAL KNEE ARTHROPLASTY;  Surgeon: [name], MD;  Location: [facility];  Service: Orthopedics;  Laterality: Left;   ULNAR NERVE TRANSPOSITION Left 11/25/2020   Procedure: ulnar nerve decompression left elbow;  Surgeon: [name], MD;  Location: [facility];  Service: Orthopedics;  Laterality: Left;   Social History   Occupational History   Not on file  Tobacco Use   Smoking status: Never   Smokeless tobacco: Never  Vaping Use   Vaping Use: Never used  Substance and Sexual Activity   Alcohol use: Yes    Comment: rarely wine   Drug use: Never   Sexual activity: Not on file

## 2022-09-15 ENCOUNTER — Telehealth: Payer: Self-pay

## 2022-09-15 NOTE — Telephone Encounter (Signed)
-----   Message from [name], MD sent at 09/14/2022  7:10 PM EDT ----- [name] can you send me a or can you put out a surgery sheet for this patient so I can give is a James Gallegos says she can call him and get him posted for sometime in June.  Thanks

## 2022-09-15 NOTE — Addendum Note (Signed)
Addended byLaurann Montana on: 09/15/2022 11:23 AM   Modules accepted: Orders

## 2022-09-15 NOTE — Telephone Encounter (Signed)
Please fil out surgery sheet for [facility]

## 2022-09-22 NOTE — Telephone Encounter (Signed)
Done. thanks

## 2022-09-25 ENCOUNTER — Other Ambulatory Visit (HOSPITAL_BASED_OUTPATIENT_CLINIC_OR_DEPARTMENT_OTHER): Payer: Self-pay

## 2022-10-15 ENCOUNTER — Ambulatory Visit
Admission: RE | Admit: 2022-10-15 | Discharge: 2022-10-15 | Disposition: A | Discharge status: Home / Self Care | Payer: PPO | Source: Ambulatory Visit | Attending: Orthopedic Surgery | Admitting: Orthopedic Surgery

## 2022-10-15 DIAGNOSIS — M12812 Other specific arthropathies, not elsewhere classified, left shoulder: Secondary | ICD-10-CM

## 2022-10-15 DIAGNOSIS — M25512 Pain in left shoulder: Secondary | ICD-10-CM | POA: Diagnosis not present

## 2022-10-21 NOTE — Progress Notes (Signed)
James Gallegos can you forward this to James Gallegos he has surgery pending thanks

## 2022-11-03 ENCOUNTER — Telehealth: Payer: Self-pay | Admitting: Orthopedic Surgery

## 2022-11-03 ENCOUNTER — Other Ambulatory Visit (HOSPITAL_BASED_OUTPATIENT_CLINIC_OR_DEPARTMENT_OTHER): Payer: Self-pay

## 2022-11-03 MED ORDER — METHOCARBAMOL 500 MG PO TABS
500.0000 mg | ORAL_TABLET | ORAL | 3 refills | Status: DC
  Filled 2022-11-03: qty 90, 11d supply, fill #0
  Filled 2023-01-18: qty 90, 11d supply, fill #1
  Filled 2023-02-15: qty 90, 11d supply, fill #2
  Filled 2023-06-02: qty 90, 11d supply, fill #3

## 2022-11-03 NOTE — Telephone Encounter (Signed)
Holding for Lauren. ?

## 2022-11-03 NOTE — Telephone Encounter (Signed)
Patient is scheduled for left reverse shoulder arthroplasty, biceps tenodesis 01/04/23 at [facility]  He would like to donate blood in the event it is needed for his shoulder surgery.  He understands the chances of needing it may be very low, nevertheless he would like to do this.   If any questions about this arrangement, patient can be reached at [phone]

## 2022-11-09 DIAGNOSIS — L570 Actinic keratosis: Secondary | ICD-10-CM | POA: Diagnosis not present

## 2022-11-09 DIAGNOSIS — L814 Other melanin hyperpigmentation: Secondary | ICD-10-CM | POA: Diagnosis not present

## 2022-11-09 DIAGNOSIS — D2261 Melanocytic nevi of right upper limb, including shoulder: Secondary | ICD-10-CM | POA: Diagnosis not present

## 2022-11-09 DIAGNOSIS — D2262 Melanocytic nevi of left upper limb, including shoulder: Secondary | ICD-10-CM | POA: Diagnosis not present

## 2022-11-09 DIAGNOSIS — L821 Other seborrheic keratosis: Secondary | ICD-10-CM | POA: Diagnosis not present

## 2022-11-09 DIAGNOSIS — Z85828 Personal history of other malignant neoplasm of skin: Secondary | ICD-10-CM | POA: Diagnosis not present

## 2022-11-09 DIAGNOSIS — D225 Melanocytic nevi of trunk: Secondary | ICD-10-CM | POA: Diagnosis not present

## 2022-11-09 NOTE — Telephone Encounter (Signed)
I have never had anyone do autologous blood for any procedure in about 10 years or so.  With Tranxene make acid the chance of him needing a blood transfusion are very very very slim.  Never had to transfuse a primary shoulder.

## 2022-11-11 NOTE — Telephone Encounter (Signed)
I spoke with patient.  Filled out prescription for autologous donation

## 2022-11-11 NOTE — Telephone Encounter (Signed)
Will be faxed. 

## 2022-12-23 NOTE — Pre-Procedure Instructions (Signed)
Surgical Instructions   Your procedure is scheduled on Tuesday, July 23rd. Report to [facility] Main Entrance "A" at 05:30 A.M., then check in with the Admitting office. Any questions or running late day of surgery: call [phone]  Questions prior to your surgery date: call [phone], Monday-Friday, 8am-4pm. If you experience any cold or flu symptoms such as cough, fever, chills, shortness of breath, etc. between now and your scheduled surgery, please notify us at the above number.     Remember:  Do not eat after midnight the night before your surgery  You may drink clear liquids until 04:30 AM the morning of your surgery.   Clear liquids allowed are: Water, Non-Citrus Juices (without pulp), Carbonated Beverages, Clear Tea, Black Coffee Only (NO MILK, CREAM OR POWDERED CREAMER of any kind), and Gatorade.   Patient Instructions  The night before surgery:  No food after midnight. ONLY clear liquids after midnight   The day of surgery (if you have diabetes): Drink ONE (1) 12 oz G2 given to you in your pre admission testing appointment by 04:30 AM the morning of surgery. Drink in one sitting. Do not sip.  This drink was given to you during your hospital  pre-op appointment visit.  Nothing else to drink after completing the  12 oz bottle of G2.         If you have questions, please contact your surgeon's office.     Take these medicines the morning of surgery with A SIP OF WATER  amoxicillin (AMOXIL)     May take these medicines IF NEEDED: EPINEPHrine (EPIPEN 2-PAK)  methocarbamol (ROBAXIN)   One week prior to surgery, STOP taking any Aspirin (unless otherwise instructed by your surgeon) Aleve, Naproxen, Ibuprofen, Motrin, Advil, Goody's, BC's, all herbal medications, fish oil, and non-prescription vitamins.                     Do NOT Smoke (Tobacco/Vaping) for 24 hours prior to your procedure.  If you use a CPAP at night, you may bring your mask/headgear for your  overnight stay.   You will be asked to remove any contacts, glasses, piercing's, hearing aid's, dentures/partials prior to surgery. Please bring cases for these items if needed.    Patients discharged the day of surgery will not be allowed to drive home, and someone needs to stay with them for 24 hours.  SURGICAL WAITING ROOM VISITATION Patients may have no more than 2 support people in the waiting area - these visitors may rotate.   Pre-op nurse will coordinate an appropriate time for 1 ADULT support person, who may not rotate, to accompany patient in pre-op.  Children under the age of 69 must have an adult with them who is not the patient and must remain in the main waiting area with an adult.  If the patient needs to stay at the hospital during part of their recovery, the visitor guidelines for inpatient rooms apply.  Please refer to the [facility] website for the visitor guidelines for any additional information.   If you received a COVID test during your pre-op visit  it is requested that you wear a mask when out in public, stay away from anyone that may not be feeling well and notify your surgeon if you develop symptoms. If you have been in contact with anyone that has tested positive in the last 10 days please notify you surgeon.        [facility]- Preparing for Total Shoulder  Arthroplasty  Before surgery, you can play an important role. Because skin is not sterile, your skin needs to be as free of germs as possible. You can reduce the number of germs on your skin by using the following products.   Benzoyl Peroxide Gel  o Reduces the number of germs present on the skin  o Applied twice a day to shoulder area starting two days before surgery   ==================================================================  Please follow these instructions carefully:  BENZOYL PEROXIDE 5% GEL  Please do not use if you have an allergy to benzoyl peroxide. If your skin becomes  reddened/irritated stop using the benzoyl peroxide.  Starting two days before surgery, apply as follows:  1. Apply benzoyl peroxide in the morning and at night. Apply after taking a shower. If you are not taking a shower clean entire shoulder front, back, and side along with the armpit with a clean wet washcloth.  2. Place a quarter-sized dollop on your SHOULDER and rub in thoroughly, making sure to cover the front, back, and side of your shoulder, along with the armpit.   2 Days prior to Surgery First Dose on _____________ Morning Second Dose on ______________ Night  Day Before Surgery First Dose on ______________ Morning Night before surgery wash (entire body except face and private areas) with CHG Soap THEN Second Dose on ____________ Night      4. Do NOT apply benzoyl peroxide gel on the day of surgery         Pre-operative 5 CHG Bath Instructions   You can play a key role in reducing the risk of infection after surgery. Your skin needs to be as free of germs as possible. You can reduce the number of germs on your skin by washing with CHG (chlorhexidine gluconate) soap before surgery. CHG is an antiseptic soap that kills germs and continues to kill germs even after washing.   DO NOT use if you have an allergy to chlorhexidine/CHG or antibacterial soaps. If your skin becomes reddened or irritated, stop using the CHG and notify one of our RNs at [phone].   Please shower with the CHG soap starting 4 days before surgery using the following schedule:     Please keep in mind the following:  DO NOT shave, including legs and underarms, starting the day of your first shower.   You may shave your face at any point before/day of surgery.  Place clean sheets on your bed the day you start using CHG soap. Use a clean washcloth (not used since being washed) for each shower. DO NOT sleep with pets once you start using the CHG.   CHG Shower Instructions:  If you choose to wash  your hair and private area, wash first with your normal shampoo/soap.  After you use shampoo/soap, rinse your hair and body thoroughly to remove shampoo/soap residue.  Turn the water OFF and apply about 3 tablespoons (45 ml) of CHG soap to a CLEAN washcloth.  Apply CHG soap ONLY FROM YOUR NECK DOWN TO YOUR TOES (washing for 3-5 minutes)  DO NOT use CHG soap on face, private areas, open wounds, or sores.  Pay special attention to the area where your surgery is being performed.  If you are having back surgery, having someone wash your back for you may be helpful. Wait 2 minutes after CHG soap is applied, then you may rinse off the CHG soap.  Pat dry with a clean towel  Put on clean clothes/pajamas   If you choose  to wear lotion, please use ONLY the CHG-compatible lotions on the back of this paper.   Additional instructions for the day of surgery: DO NOT APPLY any lotions, deodorants, cologne, or perfumes.   Do not bring valuables to the hospital. [facility] is not responsible for any belongings/valuables. Do not wear nail polish, gel polish, artificial nails, or any other type of covering on natural nails (fingers and toes) Do not wear jewelry or makeup Put on clean/comfortable clothes.  Please brush your teeth.  Ask your nurse before applying any prescription medications to the skin.     CHG Compatible Lotions   Aveeno Moisturizing lotion  Cetaphil Moisturizing Cream  Cetaphil Moisturizing Lotion  Clairol Herbal Essence Moisturizing Lotion, Dry Skin  Clairol Herbal Essence Moisturizing Lotion, Extra Dry Skin  Clairol Herbal Essence Moisturizing Lotion, Normal Skin  Curel Age Defying Therapeutic Moisturizing Lotion with Alpha Hydroxy  Curel Extreme Care Body Lotion  Curel Soothing Hands Moisturizing Hand Lotion  Curel Therapeutic Moisturizing Cream, Fragrance-Free  Curel Therapeutic Moisturizing Lotion, Fragrance-Free  Curel Therapeutic Moisturizing Lotion, Original Formula   Eucerin Daily Replenishing Lotion  Eucerin Dry Skin Therapy Plus Alpha Hydroxy Crme  Eucerin Dry Skin Therapy Plus Alpha Hydroxy Lotion  Eucerin Original Crme  Eucerin Original Lotion  Eucerin Plus Crme Eucerin Plus Lotion  Eucerin TriLipid Replenishing Lotion  Keri Anti-Bacterial Hand Lotion  Keri Deep Conditioning Original Lotion Dry Skin Formula Softly Scented  Keri Deep Conditioning Original Lotion, Fragrance Free Sensitive Skin Formula  Keri Lotion Fast Absorbing Fragrance Free Sensitive Skin Formula  Keri Lotion Fast Absorbing Softly Scented Dry Skin Formula  Keri Original Lotion  Keri Skin Renewal Lotion Keri Silky Smooth Lotion  Keri Silky Smooth Sensitive Skin Lotion  Nivea Body Creamy Conditioning Oil  Nivea Body Extra Enriched Lotion  Nivea Body Original Lotion  Nivea Body Sheer Moisturizing Lotion Nivea Crme  Nivea Skin Firming Lotion  NutraDerm 30 Skin Lotion  NutraDerm Skin Lotion  NutraDerm Therapeutic Skin Cream  NutraDerm Therapeutic Skin Lotion  ProShield Protective Hand Cream  Provon moisturizing lotion  Please read over the following fact sheets that you were given.

## 2022-12-24 ENCOUNTER — Other Ambulatory Visit: Payer: Self-pay

## 2022-12-24 ENCOUNTER — Encounter (HOSPITAL_COMMUNITY)
Admission: RE | Admit: 2022-12-24 | Discharge: 2022-12-24 | Disposition: A | Discharge status: Home / Self Care | Payer: PPO | Source: Ambulatory Visit | Attending: Orthopedic Surgery | Admitting: Orthopedic Surgery

## 2022-12-24 ENCOUNTER — Encounter (HOSPITAL_COMMUNITY): Payer: Self-pay

## 2022-12-24 VITALS — BP 157/65 | HR 52 | Temp 98.6°F | Resp 18 | Ht 69.0 in | Wt 212.7 lb

## 2022-12-24 DIAGNOSIS — Z86718 Personal history of other venous thrombosis and embolism: Secondary | ICD-10-CM | POA: Insufficient documentation

## 2022-12-24 DIAGNOSIS — G4733 Obstructive sleep apnea (adult) (pediatric): Secondary | ICD-10-CM | POA: Insufficient documentation

## 2022-12-24 DIAGNOSIS — E119 Type 2 diabetes mellitus without complications: Secondary | ICD-10-CM | POA: Insufficient documentation

## 2022-12-24 DIAGNOSIS — I10 Essential (primary) hypertension: Secondary | ICD-10-CM | POA: Diagnosis not present

## 2022-12-24 DIAGNOSIS — Z01818 Encounter for other preprocedural examination: Secondary | ICD-10-CM | POA: Diagnosis not present

## 2022-12-24 LAB — URINALYSIS, ROUTINE W REFLEX MICROSCOPIC
Bacteria, UA: NONE SEEN
Bilirubin Urine: NEGATIVE
Glucose, UA: NEGATIVE mg/dL
Hgb urine dipstick: NEGATIVE
Ketones, ur: NEGATIVE mg/dL
Leukocytes,Ua: NEGATIVE
Nitrite: NEGATIVE
Protein, ur: NEGATIVE mg/dL
Specific Gravity, Urine: 1.019 (ref 1.005–1.030)
pH: 5 (ref 5.0–8.0)

## 2022-12-24 LAB — BASIC METABOLIC PANEL
Anion gap: 7 (ref 5–15)
BUN: 18 mg/dL (ref 8–23)
CO2: 24 mmol/L (ref 22–32)
Calcium: 8.9 mg/dL (ref 8.9–10.3)
Chloride: 106 mmol/L (ref 98–111)
Creatinine, Ser: 0.98 mg/dL (ref 0.61–1.24)
GFR, Estimated: >60 mL/min (ref >60)
Glucose, Bld: 119 mg/dL — ABNORMAL HIGH (ref 70–99)
Potassium: 3.8 mmol/L (ref 3.5–5.1)
Sodium: 137 mmol/L (ref 135–145)

## 2022-12-24 LAB — CBC
HCT: 42.2 % (ref 39.0–52.0)
Hemoglobin: 14.2 g/dL (ref 13.0–17.0)
MCH: 31.4 pg (ref 26.0–34.0)
MCHC: 33.6 g/dL (ref 30.0–36.0)
MCV: 93.4 fL (ref 80.0–100.0)
Platelets: 161 10*3/uL (ref 150–400)
RBC: 4.52 MIL/uL (ref 4.22–5.81)
RDW: 12 % (ref 11.5–15.5)
WBC: 5.5 10*3/uL (ref 4.0–10.5)
nRBC: 0 % (ref 0.0–0.2)

## 2022-12-24 LAB — GLUCOSE, CAPILLARY: Glucose-Capillary: 113 mg/dL — ABNORMAL HIGH (ref 70–99)

## 2022-12-24 LAB — SURGICAL PCR SCREEN
MRSA, PCR: NEGATIVE
Staphylococcus aureus: POSITIVE — AB

## 2022-12-24 NOTE — Progress Notes (Signed)
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 Ganji  PPM/ICD - Denies Device Orders - N/A Rep Notified - N/A  Chest x-ray - Denies EKG - Pending 12-24-22 Stress Test - 05-2016 ECHO - Denies Cardiac Cath - Denies  Sleep Study - Yes CPAP - Yes pressure 13  Fasting Blood Sugar - CBG at PAT appointment 113. Patients says he is not diabetic does not take medication for it. Checks Blood Sugar times a day Does not check blood sugar  Last dose of GLP1 agonist-  Denies  Blood Thinner Instructions:Denies   ERAS Protocol -Yes PRE-SURGERY G2-   COVID TEST- N/A   Anesthesia review: Yes due to cardiac history heart murmur.  Anesthesia seen patient at PAT appointment  Patient denies shortness of breath, fever, cough and chest pain at PAT appointment  All instructions explained to the patient, with a verbal understanding of the material. Patient agrees to go over the instructions while at home for a better understanding. Patient also instructed to self quarantine after being tested for COVID-19. The opportunity to ask questions was provided.

## 2022-12-25 LAB — HEMOGLOBIN A1C
Hgb A1c MFr Bld: 5.6 % (ref 4.8–5.6)
Mean Plasma Glucose: 114 mg/dL

## 2022-12-25 LAB — URINE CULTURE: Culture: NO GROWTH

## 2022-12-27 NOTE — Progress Notes (Signed)
Anesthesia Chart Review:  72 year old male with pertinent history including HTN, OSA on CPAP, provoked DVT 2004.  I evaluated patient at his preop appointment due to reported history of possible murmur.  He says that for many years providers have intermittently commented that he has a slight heart murmur.  He denies any history of cardiovascular disease, denies any cardiopulmonary symptoms, no chest pain, shortness of breath, palpitations.  He was seen by cardiology in December 2017 prior to undergoing right TKA.  At that time stress test was ordered which showed normal wall motion, EF 69%, low risk study.  On exam today he is well-appearing, auscultation reveals regular rate and rhythm with no murmur appreciated, lungs clear to auscultation bilaterally.  History of C3-7 ACDF.  Preop labs reviewed, WNL.  EKG 12/24/2022: Sinus bradycardia.  Rate 51.  Nuclear stress test 05/20/16:   Overall LV systolic function was normal without regional wall motion abnormalities. LVEF 69%. This is a normal/low risk study.    Zannie [facility] Short Stay Center/Anesthesiology Phone [phone] 12/27/2022 3:00 PM

## 2022-12-27 NOTE — Anesthesia Preprocedure Evaluation (Addendum)
Anesthesia Evaluation  Patient identified by MRN, date of birth, ID band Patient awake    Reviewed: Allergy & Precautions, NPO status , Patient's Chart, lab work & pertinent test results  Airway Mallampati: III  TM Distance: >3 FB Neck ROM: Limited    Dental  (+) Teeth Intact, Dental Advisory Given   Pulmonary sleep apnea and Continuous Positive Airway Pressure Ventilation    Pulmonary exam normal breath sounds clear to auscultation       Cardiovascular hypertension, Pt. on medications Normal cardiovascular exam+ Valvular Problems/Murmurs  Rhythm:Regular Rate:Normal     Neuro/Psych  negative psych ROS   GI/Hepatic negative GI ROS, Neg liver ROS,,,  Endo/Other  diabetes, Well Controlled, Type 2  Hyperlipidemia Low testosterone syndrome  Renal/GU negative Renal ROS     Musculoskeletal  (+) Arthritis , Osteoarthritis,  Left hip OA   Abdominal  (+) + obese  Peds  Hematology negative hematology ROS (+)   Anesthesia Other Findings   Reproductive/Obstetrics                             Anesthesia Physical Anesthesia Plan  ASA: 3  Anesthesia Plan: General   Post-op Pain Management: Tylenol PO (pre-op)* and Regional block*   Induction: Intravenous  PONV Risk Score and Plan: 2 and Ondansetron, Treatment may vary due to age or medical condition and Dexamethasone  Airway Management Planned: Oral ETT  Additional Equipment:   Intra-op Plan:   Post-operative Plan: Extubation in OR  Informed Consent: I have reviewed the patients History and Physical, chart, labs and discussed the procedure including the risks, benefits and alternatives for the proposed anesthesia with the patient or authorized representative who has indicated his/her understanding and acceptance.     Dental advisory given  Plan Discussed with: CRNA  Anesthesia Plan Comments: (PAT note by Antionette Poles, PA-C: 72 year old  male with pertinent history including HTN, OSA on CPAP, provoked DVT 2004.  I evaluated patient at his preop appointment due to reported history of possible murmur.  He says that for many years providers have intermittently commented that he has a slight heart murmur.  He denies any history of cardiovascular disease, denies any cardiopulmonary symptoms, no chest pain, shortness of breath, palpitations.  He was seen by cardiology in December 2017 prior to undergoing right TKA.  At that time stress test was ordered which showed normal wall motion, EF 69%, low risk study.  On exam today he is well-appearing, auscultation reveals regular rate and rhythm with no murmur appreciated, lungs clear to auscultation bilaterally.  History of C3-7 ACDF.  Preop labs reviewed, WNL.  EKG 12/24/2022: Sinus bradycardia.  Rate 51.  Nuclear stress test 05/20/16:   Overall LV systolic function was normal without regional wall motion abnormalities. LVEF 69%. This is a normal/low risk study.   )        Anesthesia Quick Evaluation

## 2022-12-28 DIAGNOSIS — Z01818 Encounter for other preprocedural examination: Secondary | ICD-10-CM

## 2022-12-29 ENCOUNTER — Telehealth: Payer: Self-pay | Admitting: Orthopedic Surgery

## 2022-12-29 NOTE — Telephone Encounter (Signed)
Called patient regarding shoulder surgery  with Dr. August Saucer on 01-04-23 to inform it is moving from [facility] Main OR to [facility] OR. He is aware of location, date, and time.

## 2023-01-04 ENCOUNTER — Other Ambulatory Visit: Payer: Self-pay

## 2023-01-04 ENCOUNTER — Encounter (HOSPITAL_COMMUNITY): Payer: Self-pay | Admitting: Orthopedic Surgery

## 2023-01-04 ENCOUNTER — Encounter (HOSPITAL_COMMUNITY)
Admission: RE | Disposition: A | Discharge status: Home / Self Care | Payer: Self-pay | Source: Ambulatory Visit | Attending: Orthopedic Surgery

## 2023-01-04 ENCOUNTER — Ambulatory Visit (HOSPITAL_COMMUNITY): Payer: Self-pay | Admitting: Vascular Surgery

## 2023-01-04 ENCOUNTER — Observation Stay (HOSPITAL_COMMUNITY): Payer: PPO

## 2023-01-04 ENCOUNTER — Ambulatory Visit (HOSPITAL_BASED_OUTPATIENT_CLINIC_OR_DEPARTMENT_OTHER): Payer: PPO | Admitting: Anesthesiology

## 2023-01-04 ENCOUNTER — Observation Stay (HOSPITAL_COMMUNITY)
Admission: RE | Admit: 2023-01-04 | Discharge: 2023-01-05 | Disposition: A | Discharge status: Home / Self Care | Payer: PPO | Source: Ambulatory Visit | Attending: Orthopedic Surgery | Admitting: Orthopedic Surgery

## 2023-01-04 DIAGNOSIS — M7522 Bicipital tendinitis, left shoulder: Secondary | ICD-10-CM | POA: Diagnosis not present

## 2023-01-04 DIAGNOSIS — Z79899 Other long term (current) drug therapy: Secondary | ICD-10-CM | POA: Diagnosis not present

## 2023-01-04 DIAGNOSIS — I10 Essential (primary) hypertension: Secondary | ICD-10-CM | POA: Insufficient documentation

## 2023-01-04 DIAGNOSIS — Z01818 Encounter for other preprocedural examination: Principal | ICD-10-CM

## 2023-01-04 DIAGNOSIS — E785 Hyperlipidemia, unspecified: Secondary | ICD-10-CM

## 2023-01-04 DIAGNOSIS — Z96653 Presence of artificial knee joint, bilateral: Secondary | ICD-10-CM | POA: Diagnosis not present

## 2023-01-04 DIAGNOSIS — E6609 Other obesity due to excess calories: Secondary | ICD-10-CM

## 2023-01-04 DIAGNOSIS — Z7982 Long term (current) use of aspirin: Secondary | ICD-10-CM | POA: Diagnosis not present

## 2023-01-04 DIAGNOSIS — Z96642 Presence of left artificial hip joint: Secondary | ICD-10-CM | POA: Insufficient documentation

## 2023-01-04 DIAGNOSIS — Z6831 Body mass index (BMI) 31.0-31.9, adult: Secondary | ICD-10-CM

## 2023-01-04 DIAGNOSIS — G8918 Other acute postprocedural pain: Secondary | ICD-10-CM | POA: Diagnosis not present

## 2023-01-04 DIAGNOSIS — M12812 Other specific arthropathies, not elsewhere classified, left shoulder: Secondary | ICD-10-CM | POA: Diagnosis not present

## 2023-01-04 DIAGNOSIS — Z96612 Presence of left artificial shoulder joint: Secondary | ICD-10-CM

## 2023-01-04 DIAGNOSIS — M13812 Other specified arthritis, left shoulder: Secondary | ICD-10-CM

## 2023-01-04 DIAGNOSIS — E119 Type 2 diabetes mellitus without complications: Secondary | ICD-10-CM | POA: Insufficient documentation

## 2023-01-04 DIAGNOSIS — M75102 Unspecified rotator cuff tear or rupture of left shoulder, not specified as traumatic: Principal | ICD-10-CM

## 2023-01-04 DIAGNOSIS — Z86718 Personal history of other venous thrombosis and embolism: Secondary | ICD-10-CM | POA: Diagnosis not present

## 2023-01-04 HISTORY — PX: BICEPT TENODESIS: SHX5116

## 2023-01-04 HISTORY — PX: REVERSE SHOULDER ARTHROPLASTY: SHX5054

## 2023-01-04 SURGERY — ARTHROPLASTY, SHOULDER, TOTAL, REVERSE
Anesthesia: General | Site: Shoulder | Laterality: Left

## 2023-01-04 MED ORDER — LIDOCAINE 2% (20 MG/ML) 5 ML SYRINGE
INTRAMUSCULAR | Status: DC | PRN
  Administered 2023-01-04: 80 mg via INTRAVENOUS

## 2023-01-04 MED ORDER — GLYCOPYRROLATE 0.2 MG/ML IJ SOLN
INTRAMUSCULAR | Status: AC
  Filled 2023-01-04: qty 1

## 2023-01-04 MED ORDER — SUGAMMADEX SODIUM 200 MG/2ML IV SOLN
INTRAVENOUS | Status: DC | PRN
  Administered 2023-01-04: 200 mg via INTRAVENOUS

## 2023-01-04 MED ORDER — PHENYLEPHRINE HCL (PRESSORS) 10 MG/ML IV SOLN
INTRAVENOUS | Status: AC
  Filled 2023-01-04: qty 1

## 2023-01-04 MED ORDER — CELECOXIB 100 MG PO CAPS
100.0000 mg | ORAL_CAPSULE | Freq: Two times a day (BID) | ORAL | Status: DC
  Administered 2023-01-04: 100 mg via ORAL
  Filled 2023-01-04 (×2): qty 1

## 2023-01-04 MED ORDER — FENTANYL CITRATE PF 50 MCG/ML IJ SOSY
25.0000 ug | PREFILLED_SYRINGE | INTRAMUSCULAR | Status: DC | PRN
  Administered 2023-01-04: 50 ug via INTRAVENOUS

## 2023-01-04 MED ORDER — FENTANYL CITRATE (PF) 100 MCG/2ML IJ SOLN
INTRAMUSCULAR | Status: DC | PRN
  Administered 2023-01-04 (×3): 50 ug via INTRAVENOUS

## 2023-01-04 MED ORDER — ONDANSETRON HCL 4 MG/2ML IJ SOLN
INTRAMUSCULAR | Status: DC | PRN
  Administered 2023-01-04: 4 mg via INTRAVENOUS

## 2023-01-04 MED ORDER — PHENOL 1.4 % MT LIQD: 1.0000 | OROMUCOSAL | Status: DC | PRN

## 2023-01-04 MED ORDER — DEXAMETHASONE SODIUM PHOSPHATE 10 MG/ML IJ SOLN
INTRAMUSCULAR | Status: DC | PRN
  Administered 2023-01-04: 3 mg via INTRAVENOUS
  Administered 2023-01-04: 8 mg via INTRAVENOUS

## 2023-01-04 MED ORDER — POVIDONE-IODINE 7.5 % EX SOLN: Freq: Once | CUTANEOUS | Status: AC

## 2023-01-04 MED ORDER — AMISULPRIDE (ANTIEMETIC) 5 MG/2ML IV SOLN: 10.0000 mg | Freq: Once | INTRAVENOUS | Status: DC | PRN

## 2023-01-04 MED ORDER — METHOCARBAMOL 500 MG IVPB - SIMPLE MED
INTRAVENOUS | Status: AC
  Filled 2023-01-04: qty 55

## 2023-01-04 MED ORDER — PROPOFOL 10 MG/ML IV BOLUS
INTRAVENOUS | Status: DC | PRN
  Administered 2023-01-04: 140 mg via INTRAVENOUS

## 2023-01-04 MED ORDER — PHENYLEPHRINE HCL-NACL 20-0.9 MG/250ML-% IV SOLN
INTRAVENOUS | Status: DC | PRN
  Administered 2023-01-04: 30 ug/min via INTRAVENOUS

## 2023-01-04 MED ORDER — STERILE WATER FOR IRRIGATION IR SOLN
Status: DC | PRN
  Administered 2023-01-04: 1000 mL

## 2023-01-04 MED ORDER — OXYCODONE HCL 5 MG PO TABS
5.0000 mg | ORAL_TABLET | ORAL | Status: DC | PRN
  Administered 2023-01-04 (×3): 10 mg via ORAL
  Administered 2023-01-05: 5 mg via ORAL
  Filled 2023-01-04: qty 1
  Filled 2023-01-04 (×2): qty 2

## 2023-01-04 MED ORDER — CEFAZOLIN SODIUM-DEXTROSE 2-4 GM/100ML-% IV SOLN
2.0000 g | Freq: Three times a day (TID) | INTRAVENOUS | Status: AC
  Administered 2023-01-04 – 2023-01-05 (×3): 2 g via INTRAVENOUS
  Filled 2023-01-04 (×3): qty 100

## 2023-01-04 MED ORDER — ACETAMINOPHEN 325 MG PO TABS: 325.0000 mg | ORAL_TABLET | Freq: Four times a day (QID) | ORAL | Status: DC | PRN

## 2023-01-04 MED ORDER — FENTANYL CITRATE PF 50 MCG/ML IJ SOSY
PREFILLED_SYRINGE | INTRAMUSCULAR | Status: AC
  Administered 2023-01-04: 50 ug via INTRAVENOUS
  Filled 2023-01-04: qty 1

## 2023-01-04 MED ORDER — RIVAROXABAN 10 MG PO TABS
10.0000 mg | ORAL_TABLET | Freq: Every day | ORAL | Status: DC
  Administered 2023-01-05: 10 mg via ORAL
  Filled 2023-01-04: qty 1

## 2023-01-04 MED ORDER — FENTANYL CITRATE PF 50 MCG/ML IJ SOSY
PREFILLED_SYRINGE | INTRAMUSCULAR | Status: AC
  Filled 2023-01-04: qty 2

## 2023-01-04 MED ORDER — MIDAZOLAM HCL 2 MG/2ML IJ SOLN
INTRAMUSCULAR | Status: DC | PRN
  Administered 2023-01-04: 1 mg via INTRAVENOUS

## 2023-01-04 MED ORDER — FENTANYL CITRATE (PF) 100 MCG/2ML IJ SOLN
INTRAMUSCULAR | Status: AC
  Filled 2023-01-04: qty 2

## 2023-01-04 MED ORDER — ROCURONIUM BROMIDE 10 MG/ML (PF) SYRINGE
PREFILLED_SYRINGE | INTRAVENOUS | Status: DC | PRN
  Administered 2023-01-04: 20 mg via INTRAVENOUS
  Administered 2023-01-04: 50 mg via INTRAVENOUS

## 2023-01-04 MED ORDER — METHOCARBAMOL 500 MG PO TABS
500.0000 mg | ORAL_TABLET | Freq: Four times a day (QID) | ORAL | Status: DC | PRN
  Administered 2023-01-05: 500 mg via ORAL
  Filled 2023-01-04: qty 1

## 2023-01-04 MED ORDER — CHLORHEXIDINE GLUCONATE 0.12 % MT SOLN
15.0000 mL | Freq: Once | OROMUCOSAL | Status: AC
  Administered 2023-01-04: 15 mL via OROMUCOSAL

## 2023-01-04 MED ORDER — ONDANSETRON HCL 4 MG/2ML IJ SOLN: 4.0000 mg | Freq: Four times a day (QID) | INTRAMUSCULAR | Status: DC | PRN

## 2023-01-04 MED ORDER — EPHEDRINE 5 MG/ML INJ
INTRAVENOUS | Status: AC
  Filled 2023-01-04: qty 5

## 2023-01-04 MED ORDER — TRANEXAMIC ACID-NACL 1000-0.7 MG/100ML-% IV SOLN
1000.0000 mg | INTRAVENOUS | Status: DC
  Filled 2023-01-04: qty 100

## 2023-01-04 MED ORDER — MENTHOL 3 MG MT LOZG: 1.0000 | LOZENGE | OROMUCOSAL | Status: DC | PRN

## 2023-01-04 MED ORDER — LACTATED RINGERS IV SOLN: INTRAVENOUS | Status: DC

## 2023-01-04 MED ORDER — VANCOMYCIN HCL 1000 MG IV SOLR
INTRAVENOUS | Status: DC | PRN
  Administered 2023-01-04: 1000 mg via TOPICAL

## 2023-01-04 MED ORDER — MEPERIDINE HCL 50 MG/ML IJ SOLN: 6.2500 mg | INTRAMUSCULAR | Status: DC | PRN

## 2023-01-04 MED ORDER — METOCLOPRAMIDE HCL 5 MG PO TABS: 5.0000 mg | ORAL_TABLET | Freq: Three times a day (TID) | ORAL | Status: DC | PRN

## 2023-01-04 MED ORDER — DEXAMETHASONE SODIUM PHOSPHATE 10 MG/ML IJ SOLN
INTRAMUSCULAR | Status: AC
  Filled 2023-01-04: qty 1

## 2023-01-04 MED ORDER — ACETAMINOPHEN 500 MG PO TABS
ORAL_TABLET | ORAL | Status: AC
  Filled 2023-01-04: qty 2

## 2023-01-04 MED ORDER — EPHEDRINE SULFATE-NACL 50-0.9 MG/10ML-% IV SOSY
PREFILLED_SYRINGE | INTRAVENOUS | Status: DC | PRN
  Administered 2023-01-04 (×2): 10 mg via INTRAVENOUS

## 2023-01-04 MED ORDER — 0.9 % SODIUM CHLORIDE (POUR BTL) OPTIME
TOPICAL | Status: DC | PRN
  Administered 2023-01-04: 1000 mL

## 2023-01-04 MED ORDER — ONDANSETRON HCL 4 MG PO TABS: 4.0000 mg | ORAL_TABLET | Freq: Four times a day (QID) | ORAL | Status: DC | PRN

## 2023-01-04 MED ORDER — ROCURONIUM BROMIDE 10 MG/ML (PF) SYRINGE
PREFILLED_SYRINGE | INTRAVENOUS | Status: AC
  Filled 2023-01-04: qty 10

## 2023-01-04 MED ORDER — BUPIVACAINE LIPOSOME 1.3 % IJ SUSP
INTRAMUSCULAR | Status: DC | PRN
  Administered 2023-01-04: 10 mL via PERINEURAL

## 2023-01-04 MED ORDER — SODIUM CHLORIDE 0.9 % IV SOLN: INTRAVENOUS | Status: DC

## 2023-01-04 MED ORDER — ORAL CARE MOUTH RINSE: 15.0000 mL | Freq: Once | OROMUCOSAL | Status: AC

## 2023-01-04 MED ORDER — MIDAZOLAM HCL 2 MG/2ML IJ SOLN
INTRAMUSCULAR | Status: AC
  Filled 2023-01-04: qty 2

## 2023-01-04 MED ORDER — VANCOMYCIN HCL 1000 MG IV SOLR
INTRAVENOUS | Status: AC
  Filled 2023-01-04: qty 20

## 2023-01-04 MED ORDER — ACETAMINOPHEN 500 MG PO TABS
1000.0000 mg | ORAL_TABLET | Freq: Once | ORAL | Status: AC
  Administered 2023-01-04: 1000 mg via ORAL

## 2023-01-04 MED ORDER — BUPIVACAINE HCL (PF) 0.5 % IJ SOLN
INTRAMUSCULAR | Status: DC | PRN
  Administered 2023-01-04 (×2): 10 mL via PERINEURAL

## 2023-01-04 MED ORDER — METOCLOPRAMIDE HCL 5 MG/ML IJ SOLN: 5.0000 mg | Freq: Three times a day (TID) | INTRAMUSCULAR | Status: DC | PRN

## 2023-01-04 MED ORDER — DOCUSATE SODIUM 100 MG PO CAPS
100.0000 mg | ORAL_CAPSULE | Freq: Two times a day (BID) | ORAL | Status: DC
  Administered 2023-01-04 – 2023-01-05 (×2): 100 mg via ORAL
  Filled 2023-01-04 (×2): qty 1

## 2023-01-04 MED ORDER — POVIDONE-IODINE 10 % EX SWAB: 2.0000 | Freq: Once | CUTANEOUS | Status: AC

## 2023-01-04 MED ORDER — ONDANSETRON HCL 4 MG/2ML IJ SOLN
INTRAMUSCULAR | Status: AC
  Filled 2023-01-04: qty 2

## 2023-01-04 MED ORDER — METHOCARBAMOL 500 MG IVPB - SIMPLE MED
500.0000 mg | Freq: Four times a day (QID) | INTRAVENOUS | Status: DC | PRN
  Administered 2023-01-04: 500 mg via INTRAVENOUS

## 2023-01-04 MED ORDER — ACETAMINOPHEN 500 MG PO TABS
1000.0000 mg | ORAL_TABLET | Freq: Once | ORAL | Status: AC
  Filled 2023-01-04: qty 2

## 2023-01-04 MED ORDER — LIDOCAINE HCL (PF) 2 % IJ SOLN
INTRAMUSCULAR | Status: AC
  Filled 2023-01-04: qty 5

## 2023-01-04 MED ORDER — ACETAMINOPHEN 500 MG PO TABS
1000.0000 mg | ORAL_TABLET | Freq: Four times a day (QID) | ORAL | Status: AC
  Administered 2023-01-04 – 2023-01-05 (×4): 1000 mg via ORAL
  Filled 2023-01-04 (×3): qty 2

## 2023-01-04 MED ORDER — OXYCODONE HCL 5 MG PO TABS
ORAL_TABLET | ORAL | Status: AC
  Filled 2023-01-04: qty 2

## 2023-01-04 MED ORDER — HYDROMORPHONE HCL 1 MG/ML IJ SOLN
0.5000 mg | INTRAMUSCULAR | Status: DC | PRN
  Administered 2023-01-04: 0.5 mg via INTRAVENOUS
  Filled 2023-01-04: qty 0.5

## 2023-01-04 MED ORDER — PROPOFOL 10 MG/ML IV BOLUS
INTRAVENOUS | Status: AC
  Filled 2023-01-04: qty 20

## 2023-01-04 MED ORDER — CEFAZOLIN SODIUM-DEXTROSE 2-4 GM/100ML-% IV SOLN
2.0000 g | INTRAVENOUS | Status: AC
  Administered 2023-01-04: 2 g via INTRAVENOUS
  Filled 2023-01-04: qty 100

## 2023-01-04 SURGICAL SUPPLY — 123 items
AID PSTN UNV HD RSTRNT DISP (MISCELLANEOUS) ×1
ALCOHOL 70% 16 OZ (MISCELLANEOUS) ×1 IMPLANT
APL PRP STRL LF DISP 70% ISPRP (MISCELLANEOUS) ×1
AUG COMP REV MI TAPER ADAPTER (Joint) ×1 IMPLANT
AUGMENT COMP REV MI TAPR ADPTR (Joint) IMPLANT
BAG COUNTER SPONGE SURGICOUNT (BAG) ×1 IMPLANT
BAG SPNG CNTER NS LX DISP (BAG) ×1
BIT DRILL 1.5X85 (BIT) IMPLANT
BIT DRILL 1.6MX128 (BIT) IMPLANT
BIT DRILL 2.7 W/STOP DISP (BIT) IMPLANT
BIT DRILL CAL (BIT) IMPLANT
BIT DRILL QUICK REL 1/8 2PK SL (BIT) IMPLANT
BLADE SAW SGTL 13X75X1.27 (BLADE) ×1 IMPLANT
BNDG CMPR 5X4 KNIT ELC UNQ LF (GAUZE/BANDAGES/DRESSINGS) ×1
BNDG ELASTIC 4INX 5YD STR LF (GAUZE/BANDAGES/DRESSINGS) ×1 IMPLANT
BNDG GAUZE DERMACEA FLUFF 4 (GAUZE/BANDAGES/DRESSINGS) ×1 IMPLANT
BNDG GZE DERMACEA 4 6PLY (GAUZE/BANDAGES/DRESSINGS)
BRNG HUM +3 36 RVRS SHLDR (Shoulder) ×1 IMPLANT
BSPLAT GLND SM AUG TPR ADPR (Joint) ×1 IMPLANT
CHLORAPREP W/TINT 26 (MISCELLANEOUS) ×1 IMPLANT
CLSR STERI-STRIP ANTIMIC 1/2X4 (GAUZE/BANDAGES/DRESSINGS) IMPLANT
COOLER ICEMAN CLASSIC (MISCELLANEOUS) ×1 IMPLANT
CORD BIPOLAR FORCEPS 12FT (ELECTRODE) ×1 IMPLANT
COVER SURGICAL LIGHT HANDLE (MISCELLANEOUS) ×1 IMPLANT
CUFF TOURN SGL QUICK 18X4 (TOURNIQUET CUFF) ×1 IMPLANT
CUFF TOURN SGL QUICK 24 (TOURNIQUET CUFF)
CUFF TRNQT CYL 24X4X16.5-23 (TOURNIQUET CUFF) ×1 IMPLANT
DRAIN PENROSE 0.5X18 (DRAIN) ×1 IMPLANT
DRAPE INCISE IOBAN 66X45 STRL (DRAPES) ×1 IMPLANT
DRAPE OEC MINIVIEW 54X84 (DRAPES) ×1 IMPLANT
DRAPE ORTHO SPLIT 77X108 STRL (DRAPES) ×2
DRAPE SURG ORHT 6 SPLT 77X108 (DRAPES) IMPLANT
DRAPE U-SHAPE 47X51 STRL (DRAPES) ×2 IMPLANT
DRILL BIT CAL (BIT) ×1
DRSG ADAPTIC 3X8 NADH LF (GAUZE/BANDAGES/DRESSINGS) ×1 IMPLANT
DRSG AQUACEL AG ADV 3.5X10 (GAUZE/BANDAGES/DRESSINGS) ×1 IMPLANT
DURAPREP 26ML APPLICATOR (WOUND CARE) ×1 IMPLANT
ELECT CAUTERY BLADE TIP 2.5 (TIP)
ELECT NDL TIP 2.8 STRL (NEEDLE) ×1 IMPLANT
ELECT NEEDLE TIP 2.8 STRL (NEEDLE)
ELECT REM PT RETURN 15FT ADLT (MISCELLANEOUS) ×1 IMPLANT
ELECTRODE CAUTERY BLDE TIP 2.5 (TIP) ×1 IMPLANT
GAUZE PAD ABD 8X10 STRL (GAUZE/BANDAGES/DRESSINGS) ×1 IMPLANT
GAUZE SPONGE 4X4 12PLY STRL (GAUZE/BANDAGES/DRESSINGS) ×1 IMPLANT
GAUZE XEROFORM 5X9 LF (GAUZE/BANDAGES/DRESSINGS) ×1 IMPLANT
GLENOID SPHERE 36+6 (Joint) IMPLANT
GLOVE BIOGEL PI IND STRL 6.5 (GLOVE) IMPLANT
GLOVE BIOGEL PI IND STRL 7.0 (GLOVE) ×2 IMPLANT
GLOVE BIOGEL PI IND STRL 8 (GLOVE) ×2 IMPLANT
GLOVE ECLIPSE 6.5 STRL STRAW (GLOVE) IMPLANT
GLOVE ECLIPSE 7.0 STRL STRAW (GLOVE) ×1 IMPLANT
GLOVE ECLIPSE 8.0 STRL XLNG CF (GLOVE) ×1 IMPLANT
GLOVE SURG ORTHO 8.0 STRL STRW (GLOVE) ×1 IMPLANT
GOWN STRL REUS W/ TWL LRG LVL3 (GOWN DISPOSABLE) ×2 IMPLANT
GOWN STRL REUS W/ TWL XL LVL3 (GOWN DISPOSABLE) IMPLANT
GOWN STRL REUS W/TWL LRG LVL3 (GOWN DISPOSABLE) ×2
GOWN STRL REUS W/TWL XL LVL3 (GOWN DISPOSABLE)
GUIDE RSA SHLD BM ROT L SU (ORTHOPEDIC DISPOSABLE SUPPLIES) IMPLANT
HYDROGEN PEROXIDE 16OZ (MISCELLANEOUS) ×1 IMPLANT
INST ROD SCREW-IN VERSION (INSTRUMENTS) ×1
INSTRUMENT ROD SCREW-IN VERSIN (INSTRUMENTS) IMPLANT
JET LAVAGE IRRISEPT WOUND (IRRIGATION / IRRIGATOR) ×1
KIT BASIN OR (CUSTOM PROCEDURE TRAY) ×1 IMPLANT
KIT TURNOVER KIT A (KITS) IMPLANT
LAVAGE JET IRRISEPT WOUND (IRRIGATION / IRRIGATOR) ×1 IMPLANT
LOOP VESSEL MAXI BLUE (MISCELLANEOUS) ×1 IMPLANT
MANIFOLD NEPTUNE II (INSTRUMENTS) ×1 IMPLANT
NDL SUT 6 .5 CRC .975X.05 MAYO (NEEDLE) IMPLANT
NDL TAPERED W/ NITINOL LOOP (MISCELLANEOUS) ×1 IMPLANT
NEEDLE MAYO TAPER (NEEDLE)
NEEDLE TAPERED W/ NITINOL LOOP (MISCELLANEOUS)
NS IRRIG 1000ML POUR BTL (IV SOLUTION) ×1 IMPLANT
PACK ORTHO EXTREMITY (CUSTOM PROCEDURE TRAY) ×1 IMPLANT
PACK SHOULDER (CUSTOM PROCEDURE TRAY) ×1 IMPLANT
PAD ARMBOARD 7.5X6 YLW CONV (MISCELLANEOUS) ×2 IMPLANT
PAD CAST 3X4 CTTN HI CHSV (CAST SUPPLIES) ×1 IMPLANT
PAD CAST 4YDX4 CTTN HI CHSV (CAST SUPPLIES) ×1 IMPLANT
PAD COLD SHLDR WRAP-ON (PAD) ×1 IMPLANT
PADDING CAST COTTON 3X4 STRL (CAST SUPPLIES)
PADDING CAST COTTON 4X4 STRL (CAST SUPPLIES)
PASSER SUT SWANSON 36MM LOOP (INSTRUMENTS) ×1 IMPLANT
PENCIL SMOKE EVACUATOR (MISCELLANEOUS) IMPLANT
PIN THREADED REVERSE (PIN) IMPLANT
PROTECTOR NERVE ULNAR (MISCELLANEOUS) ×1 IMPLANT
REAMER GUIDE BUSHING SURG DISP (MISCELLANEOUS) IMPLANT
REAMER GUIDE W/SCREW AUG (MISCELLANEOUS) IMPLANT
RESTRAINT HEAD UNIVERSAL NS (MISCELLANEOUS) ×1 IMPLANT
RETRIEVER SUT HEWSON (MISCELLANEOUS) IMPLANT
SCREW BONE STRL 6.5MMX30MM (Screw) IMPLANT
SCREW LOCKING 4.75MMX15MM (Screw) IMPLANT
SCREW LOCKING NS 4.75MMX20MM (Screw) IMPLANT
SCREW LOCKING STRL 4.75X25X3.5 (Screw) IMPLANT
SLING ARM FOAM STRAP LRG (SOFTGOODS) IMPLANT
SLING ARM IMMOBILIZER LRG (SOFTGOODS) ×1 IMPLANT
SOL PREP POV-IOD 4OZ 10% (MISCELLANEOUS) ×1 IMPLANT
SPONGE T-LAP 4X18 ~~LOC~~+RFID (SPONGE) ×2 IMPLANT
STEM SHOULDER (Stem) IMPLANT
STRIP CLOSURE SKIN 1/2X4 (GAUZE/BANDAGES/DRESSINGS) ×1 IMPLANT
SUCTION TUBE FRAZIER 10FR DISP (SUCTIONS) ×1 IMPLANT
SUT BROADBAND TAPE 2PK 1.5 (SUTURE) IMPLANT
SUT ETHILON 3 0 PS 1 (SUTURE) ×1 IMPLANT
SUT ETHILON 4 0 PS 2 18 (SUTURE) ×1 IMPLANT
SUT FIBERWIRE #2 38 T-5 BLUE (SUTURE)
SUT MAXBRAID (SUTURE) IMPLANT
SUT MNCRL AB 3-0 PS2 18 (SUTURE) ×1 IMPLANT
SUT SILK 2 0 (SUTURE) ×1
SUT SILK 2-0 18XBRD TIE 12 (SUTURE) ×1 IMPLANT
SUT VIC AB 0 CT1 27 (SUTURE) ×6
SUT VIC AB 0 CT1 27XBRD ANBCTR (SUTURE) ×4 IMPLANT
SUT VIC AB 0 CT1 27XBRD ANTBC (SUTURE) ×2 IMPLANT
SUT VIC AB 1 CT1 27 (SUTURE) ×2
SUT VIC AB 1 CT1 27XBRD ANBCTR (SUTURE) ×2 IMPLANT
SUT VIC AB 2-0 CT1 27 (SUTURE) ×3
SUT VIC AB 2-0 CT1 TAPERPNT 27 (SUTURE) ×3 IMPLANT
SUT VICRYL 0 UR6 27IN ABS (SUTURE) ×2 IMPLANT
SUTURE FIBERWR #2 38 T-5 BLUE (SUTURE) IMPLANT
TOWEL OR 17X26 10 PK STRL BLUE (TOWEL DISPOSABLE) ×2 IMPLANT
TRAY FOL W/BAG SLVR 16FR STRL (SET/KITS/TRAYS/PACK) IMPLANT
TRAY FOLEY W/BAG SLVR 16FR LF (SET/KITS/TRAYS/PACK)
TRAY HUM REV SHOULDER 36 +3 (Shoulder) IMPLANT
TRAY HUM REV SHOULDER STD +6 (Shoulder) IMPLANT
UNDERPAD 30X36 HEAVY ABSORB (UNDERPADS AND DIAPERS) ×1 IMPLANT
WATER STERILE IRR 1000ML POUR (IV SOLUTION) ×1 IMPLANT

## 2023-01-04 NOTE — Progress Notes (Signed)
   01/04/23 1946  BiPAP/CPAP/SIPAP  BiPAP/CPAP/SIPAP Pt Type Adult (PT DECLINED ASSISTANCE WITH HOME CPAP)  BiPAP/CPAP/SIPAP Resmed  Mask Type Nasal pillows  EPAP 13 cmH2O  FiO2 (%) 21 %  Heater Temperature  (PT ALREADY FILLED HUMIDIFIER)  Patient Home Equipment Yes  Auto Titrate No  Safety Check Completed by RT for Home Unit Yes, no issues noted (NO FRAYS ON CORD NOTED, MACHINE PLUGGED INTO RED OUTLET)

## 2023-01-04 NOTE — Anesthesia Procedure Notes (Signed)
Anesthesia Regional Block: Interscalene brachial plexus block   Pre-Anesthetic Checklist: , timeout performed,  Correct Patient, Correct Site, Correct Laterality,  Correct Procedure, Correct Position, site marked,  Risks and benefits discussed,  Surgical consent,  Pre-op evaluation,  At surgeon's request and post-op pain management  Laterality: Upper and Left  Prep: chloraprep       Needles:  Injection technique: Single-shot  Needle Type: Stimulator Needle - 40     Needle Length: 4cm  Needle Gauge: 22     Additional Needles:   Procedures:,,,, ultrasound used (permanent image in chart),,    Narrative:  Start time: 01/04/2023 7:04 AM End time: 01/04/2023 7:24 AM Injection made incrementally with aspirations every 5 mL.  Performed by: Personally  Anesthesiologist: Lewie Loron, MD  Additional Notes: BP cuff, SpO2 and EKG monitors applied. Sedation begun. Nerve location verified with ultrasound. Anesthetic injected incrementally, slowly, and after neg aspirations under direct u/s guidance. Good perineural spread. Tolerated well.

## 2023-01-04 NOTE — Anesthesia Procedure Notes (Signed)
Procedure Name: Intubation Date/Time: 01/04/2023 7:36 AM  Performed by: Nelle Don, CRNAPre-anesthesia Checklist: Patient identified, Emergency Drugs available, Suction available and Patient being monitored Patient Re-evaluated:Patient Re-evaluated prior to induction Oxygen Delivery Method: Circle system utilized Preoxygenation: Pre-oxygenation with 100% oxygen Induction Type: IV induction Ventilation: Mask ventilation without difficulty Laryngoscope Size: Mac and 4 Grade View: Grade II Tube type: Oral Tube size: 7.5 mm Number of attempts: 1 Airway Equipment and Method: Stylet Placement Confirmation: ETT inserted through vocal cords under direct vision, positive ETCO2 and breath sounds checked- equal and bilateral Secured at: 23 cm Tube secured with: Tape Dental Injury: Teeth and Oropharynx as per pre-operative assessment  Comments: Neck remained in neutral position during intubation.

## 2023-01-04 NOTE — Anesthesia Postprocedure Evaluation (Signed)
Anesthesia Post Note  Patient: James Gallegos  Procedure(s) Performed: LEFT REVERSE SHOULDER ARTHROPLASTY (Left: Shoulder) LEFT BICEPS TENODESIS (Left: Shoulder)     Patient location during evaluation: PACU Anesthesia Type: General Level of consciousness: sedated and patient cooperative Pain management: pain level controlled Vital Signs Assessment: post-procedure vital signs reviewed and stable Respiratory status: spontaneous breathing Cardiovascular status: stable Anesthetic complications: no Comments: Pt with axillary pain. Block distribution consistent with interscalene block. Discussed difficulty covering entire distrubution of surgical field. Supplemental intercostal brachial nerve block done in PACU.    No notable events documented.  Last Vitals:  Vitals:   01/04/23 1200 01/04/23 1215  BP: (!) 147/62 (!) 146/60  Pulse: 62 (!) 59  Resp: 12 12  Temp:    SpO2: 93% 94%    Last Pain:  Vitals:   01/04/23 1200  TempSrc:   PainSc: 8                  Shakeita Vandevander [employer]

## 2023-01-04 NOTE — Op Note (Signed)
NAME: James Gallegos, James Gallegos MEDICAL RECORD NO: [id number] ACCOUNT NO: [id number] DATE OF BIRTH: 1951/02/10 FACILITY: [facility] LOCATION: [facility] PHYSICIAN: Graylin Shiver. August Saucer, MD  Operative Report   DATE OF PROCEDURE: 01/04/2023  PREOPERATIVE DIAGNOSIS:  Left shoulder rotator cuff arthropathy and biceps tendinitis.  POSTOPERATIVE DIAGNOSIS:  Left shoulder rotator cuff arthropathy and biceps tendinitis.  PROCEDURE:  Left reverse shoulder replacement and biceps tenodesis; components utilized include comprehensive small augmented baseplate with +6 glenosphere mini humeral stem size 10 with mini humeral tray +6 tapered offset and +3 polyethylene liner.  SURGEON:  Graylin Shiver. August Saucer, MD  ASSISTANT:  Karenann Cai, PA.  INDICATIONS:  This is a 72 year old patient with end-stage left shoulder rotator cuff arthropathy along with significant pain.  Presents for reverse shoulder replacement after explanation of risks and benefits.  DESCRIPTION OF PROCEDURE:  The patient was brought to the operating room where general anesthetic was induced.  Preoperative antibiotics administered.  Timeout was called.  The patient was placed in the beach chair position with the head in neutral position.  Left shoulder, arm and hand prescrubbed with alcohol and Betadine, allowed to air dry, prepped with DuraPrep solution and draped in sterile manner.  James Gallegos was used to  cover the operative field.  After calling a timeout, the deltopectoral approach was made.  Skin and subcutaneous tissue were sharply divided.  Cephalic vein mobilized laterally.  The deltoid was mobilized anteriorly off its attachment.  This was done  manually.  Subdeltoid and subacromial adhesions were released.  Significant joint effusion, which was serous was present.  The part of the CA ligament was released.  The axillary nerve was palpated and protected at all times during the case.  Kolbel  retractor was placed.  Biceps tendon tenodesed to the pec tendon,  which was released about 1.5 cm.  This was done with 5-0 Vicryl sutures.  The circumflex vessels were then ligated using silk ligatures.  Next, the biceps tendon proximally was released  and the rotator interval was opened.  Subscapularis was then detached using a 15 blade from the lesser tuberosity and tagged with a 0 Vicryl sutures.  Capsular dissection was performed around to the 7 o'clock position using a Cobb elevator and extending  about 2 cm below the inferior humeral neck.  Next, the humeral head was dislocated.  Reverse retractor placed along with James Gallegos.  At this time, reaming was performed up to 10 mm and then the head was cut in alignment with its normal  retroversion, which is about 30 degrees.  Broaching then performed up to a size 10, which gave a nice snug fit.  Next, a cap was placed and then posterior and anterior retractors were placed around the glenoid.  The labrum was circumferentially excised  with care being taken to avoid injury to surrounding neurovascular structures.  Next, the patient-specific instrumentation was placed on the glenoid.  Glenoid was then reamed in accordance with preoperative templating about 5 mm inferior tilt down to the  predetermined depth of 6.5 mm from the inferior surface.  Next reaming for the augment was performed and trial was placed, which obtained very good contact.  Next, the true baseplate was placed with 1 central compression screw and four peripheral  locking screws.  At this time, trial reduction was performed with +3 and +6 glenosphere along with the +6 tapered offset humeral tray with a neutral liner and +3 liner.  The most stable construct was the +6 glenosphere with the +6 offset humeral tray  and  the +3 liner.  This gave very good stability to extension, and abduction with a forward force along with very good range of motion with internal and external rotation at 90 degrees of abduction.  Difficult to reduce and difficult to  re-dislocate.  Next,  trial components were removed.  Thorough irrigation with IrriSept solution performed, which was also used after the incision. Next, the true glenosphere was placed with a dry Morse taper fit.  We then placed the stem into the canal, which was irrigated  with IrriSept solution, which was sucked out and then we placed in some vancomycin powder.  A stem had a nice press fit.  Next, we put in the trial components on the tray and liner and the same stability parameters were maintained.  We put in the true  tray and liner at that time.  It should also be noted that, we placed 6 suture tapes into the lesser tuberosity prior to placing the stem.  The patient had excellent stability and we were able to repair, the subscap with the arm in about 45 degrees of  external rotation using the suture tapes.  This was done using the SutureTapes and Nice knots.  At this time, axillary nerve was palpated and found to be intact.  Thorough irrigation with 4 liters of pouring irrigation was performed and then we used  IrriSept and then we placed vancomycin powder on the implant.  Deltopectoral interval was then closed using a #1 Vicryl suture followed by interrupted inverted 0 Vicryl suture, 2-0 Vicryl suture, and 3-0 Monocryl.  Steri-Strips and Aquacel dressing  applied along with the shoulder immobilizer.  The patient tolerated the procedure well without immediate complications, transferred to the recovery room in stable condition.  James Gallegos's assistance was required at all times for retraction, opening, closing,  mobilization of tissue.  His assistance was a medical necessity.   PUS D: 01/04/2023 11:04:43 am T: 01/04/2023 11:21:00 am  JOB: [id number]/ [id number]

## 2023-01-04 NOTE — Transfer of Care (Signed)
Immediate Anesthesia Transfer of Care Note  Patient: James Gallegos  Procedure(s) Performed: LEFT REVERSE SHOULDER ARTHROPLASTY (Left: Shoulder) LEFT BICEPS TENODESIS (Left: Shoulder)  Patient Location: PACU  Anesthesia Type:General  Level of Consciousness: awake and patient cooperative  Airway & Oxygen Therapy: Patient Spontanous Breathing and Patient connected to face mask oxygen  Post-op Assessment: Report given to RN and Post -op Vital signs reviewed and stable  Post vital signs: Reviewed and stable  Last Vitals:  Vitals Value Taken Time  BP    Temp    Pulse 73 01/04/23 1045  Resp 16 01/04/23 1045  SpO2 100 % 01/04/23 1045  Vitals shown include unfiled device data.  Last Pain:  Vitals:   01/04/23 0605  TempSrc:   PainSc: 0-No pain      Patients Stated Pain Goal: 5 (01/04/23 9562)  Complications: No notable events documented.

## 2023-01-04 NOTE — H&P (Signed)
James Gallegos is an 72 y.o. male.   Chief Complaint: left shoulder pain HPI: James Gallegos is a 72 y.o. male who presents  reporting left shoulder pain which has worsened over the past several months.  Patient reports pain is a bigger problem than weakness.  Activities of Gallegos living including yard work type activities are very painful for him.  Does report Gallegos pain as well as pain which is sometimes affects his sleep.  Has tried an injection in the remote past which did not help much.  Has tried taking medication without much relief.  Has a history of right shoulder rotator cuff surgery and he has done well with that surgery.  Diabetes is under good control.  Does have his wife at home.  Along with the grandson.  Past Medical History:  Diagnosis Date   Arthritis    back , R foot -   Deep vein thrombosis (HCC)    after knee surgery in 2004   Diabetes mellitus without complication (HCC)    Patients not aware of being dx   Hypertension    Murmur, cardiac    pt. reports its difficult to auscultate    Sleep apnea    CPAP- last study- 2014 cpap set on 13 per pt   Wears glasses 11/20/2020   for reading    Past Surgical History:  Procedure Laterality Date   ANTERIOR CERVICAL DECOMP/DISCECTOMY FUSION N/A 04/14/2020   Procedure: ANTERIOR CERVICAL DECOMPRESSION/DISCECTOMY FUSION, INTERBODY PROSTHESIS, PLATE/SCREWS CERVICAL THREE-FOUR,CERVICAL FOUR-FIVE,CERVICAL SIX-SEVEN;  Surgeon: Tressie Stalker, MD;  Location: [facility] OR;  Service: Neurosurgery;  Laterality: N/A;  anterior   CERVICAL SPINE SURGERY N/A 1990   C5-6 discectomy plates and screws in neck per pt   colonscopy  2017   polyps removed per pt follow up in 5 years   FINGER SURGERY Right 2008   x 3 index finger   KNEE ARTHROSCOPY Bilateral    L 2004, R 2005   MASS EXCISION Right 01/18/2017   Procedure: EXCISION BENIGN RIGHT NECK MASS;  Surgeon: Drema Halon, MD;  Location: [facility];  Service: ENT;   Laterality: Right;   MASS EXCISION Bilateral 03/23/2019   Procedure: EXCISIONS OF RIGHT CHEEK LESION, RIGHT POSTERIOR NECK LIPOMA  AND LEFT NECK NODULE;  Surgeon: Drema Halon, MD;  Location: [facility];  Service: ENT;  Laterality: Bilateral;   NASAL FRACTURE SURGERY  1980   TOTAL HIP ARTHROPLASTY Left 12/18/2021   Procedure: LEFT TOTAL HIP ARTHROPLASTY ANTERIOR APPROACH;  Surgeon: Kathryne Hitch, MD;  Location: [facility]S;  Service: Orthopedics;  Laterality: Left;   TOTAL KNEE ARTHROPLASTY Right 06/01/2016   Procedure: TOTAL KNEE ARTHROPLASTY;  Surgeon: Valeria Batman, MD;  Location: [facility] OR;  Service: Orthopedics;  Laterality: Right;   TOTAL KNEE ARTHROPLASTY Left 07/25/2018   Procedure: LEFT TOTAL KNEE ARTHROPLASTY;  Surgeon: Valeria Batman, MD;  Location: [facility] OR;  Service: Orthopedics;  Laterality: Left;   ULNAR NERVE TRANSPOSITION Left 11/25/2020   Procedure: ulnar nerve decompression left elbow;  Surgeon: Valeria Batman, MD;  Location: [facility] OR;  Service: Orthopedics;  Laterality: Left;    Family History  Problem Relation Age of Onset   Dementia Mother    Alzheimer's disease Father    Healthy Brother    Colon cancer Neg Hx    Esophageal cancer Neg Hx    Rectal cancer Neg Hx    Stomach cancer Neg Hx    Cancer Neg Hx  Diabetes Neg Hx    Social History:  reports that he has never smoked. He has never used smokeless tobacco. He reports current alcohol use. He reports that he does not use drugs.  Allergies:  Allergies  Allergen Reactions   Yellow Jacket Venom Anaphylaxis    Medications Prior to Admission  Medication Sig Dispense Refill   amoxicillin (AMOXIL) 500 MG capsule Take 2,000 mg by mouth See admin instructions. Take 2000 mg by mouth     Ascorbic Acid (VITAMIN C) 1000 MG tablet Take 3,000 mg by mouth Gallegos.     B Complex-Biotin-FA (B-100 B-COMPLEX) TABS Take 1 tablet by mouth Gallegos.     Cholecalciferol (VITAMIN D-3) 5000 units TABS Take  5,000 Units by mouth Gallegos.     Cyanocobalamin (VITAMIN B-12) 5000 MCG TBDP Take 5,000 mcg by mouth Gallegos.     DHEA 25 MG CAPS Take 25 mg by mouth Gallegos.     Diindolylmethane POWD Take 250 mg by mouth Gallegos. Capsule  "DIM"     EPINEPHrine (EPIPEN 2-PAK) 0.3 mg/0.3 mL IJ SOAJ injection Inject as needed (Patient taking differently: Inject 0.3 mg into the muscle as needed for anaphylaxis.) 2 each 6   ibuprofen (ADVIL) 200 MG tablet Take 400 mg by mouth every 6 (six) hours as needed for moderate pain.     KRILL OIL PO Take by mouth.     Lysine HCl 500 MG TABS Take 1,000 mg by mouth Gallegos.      Magnesium 400 MG TABS Take 400 mg by mouth at bedtime.     Menaquinone-7 (VITAMIN K2 PO) Take 200 mcg by mouth Gallegos.     methocarbamol (ROBAXIN) 500 MG tablet Take 1-2 tablets (500-1,000 mg total) by mouth every 6-8 hours as needed. 90 tablet 3   PRESCRIPTION MEDICATION Apply 1 application topically at bedtime. Testerone 13.5 % applied to shoulder [facility]  135 mg/ mL     Quercetin 500 MG CAPS Take 500 mg by mouth Gallegos.     Saw Palmetto, Serenoa repens, 320 MG CAPS Take 320 mg by mouth Gallegos.     zinc gluconate 50 MG tablet Take 50 mg by mouth Gallegos.     aspirin 81 MG chewable tablet Chew 1 tablet (81 mg total) by mouth 2 (two) times Gallegos. (Patient not taking: Reported on 12/21/2022) 35 tablet 0   HYDROcodone-acetaminophen (NORCO/VICODIN) 5-325 MG tablet Take 1-2 tablets by mouth every 6 (six) hours as needed for moderate pain. (Patient not taking: Reported on 12/21/2022) 30 tablet 0   losartan (COZAAR) 100 MG tablet Take 1 tablet (100 mg total) by mouth Gallegos. (Patient not taking: Reported on 12/21/2022) 90 tablet 3   losartan-hydrochlorothiazide (HYZAAR) 100-25 MG tablet Take 1 tablet by mouth Gallegos. (Patient not taking: Reported on 12/21/2022) 90 tablet 3   methocarbamol (ROBAXIN) 500 MG tablet Take 1 tablet (500 mg total) by mouth every 6 (six) hours as needed. (Patient not taking:  Reported on 12/21/2022) 40 tablet 1   oseltamivir (TAMIFLU) 75 MG capsule Take 1 capsule (75 mg total) by mouth 2 (two) times Gallegos. (Patient not taking: Reported on 12/21/2022) 10 capsule 0   predniSONE (DELTASONE) 50 MG tablet Take 1 tablet (50 mg total) by mouth Gallegos for 5 days (Patient not taking: Reported on 12/21/2022) 5 tablet 0    No results found for this or any previous visit (from the past 48 hour(s)). No results found.  Review of Systems  Musculoskeletal:  Positive for arthralgias.  All other systems reviewed and are negative.   Blood pressure (!) 143/64, pulse (!) 53, temperature 98.5 F (36.9 C), temperature source Oral, resp. rate 17, height 5\' 9"  (1.753 m), weight 96.5 kg, SpO2 95%. Physical Exam Vitals reviewed.  HENT:     Head: Normocephalic.     Nose: Nose normal.     Mouth/Throat:     Mouth: Mucous membranes are moist.  Eyes:     Pupils: Pupils are equal, round, and reactive to light.  Cardiovascular:     Rate and Rhythm: Normal rate.     Pulses: Normal pulses.  Pulmonary:     Effort: Pulmonary effort is normal.  Abdominal:     General: Abdomen is flat.  Musculoskeletal:     Cervical back: Normal range of motion.  Skin:    General: Skin is warm.     Capillary Refill: Capillary refill takes less than 2 seconds.  Neurological:     General: No focal deficit present.     Mental Status: He is alert.  Psychiatric:        Mood and Affect: Mood normal.     Ortho exam demonstrates forward flexion and abduction both above 90 degrees. Deltoid fires. He has good subscap and infraspinatus strength but supraspinatus strength is a little weaker on the left than the right. Does have painful crepitus with forward flexion as well as internal/external rotation at 90 degrees of abduction. No discrete AC joint tenderness is present. O'Brien's testing is positive. Tenderness in the bicipital groove. Coarseness with internal and external rotation at 90 degrees of abduction.   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  Plan is reverse shoulder replacement.  With the use of imaging studies models and guides the rationale of reverse shoulder replacement is discussed with the patient.  Plan for thin cut CT scan for patient specific instrumentation followed by reverse shoulder replacement sometime in June.  Patient understands the risk and benefits of the surgery which are similar to those that were present for his hip replacement.  They include not limited to infection nerve vessel damage dislocation potential need for revision and unique to the shoulder would be shoulder stiffness.  He does have a good range of motion currently but it is painful.  I think this would primarily be a pain relieving operation for him.  Should maintain the range of motion that he has.  All questions answered   Burnard Bunting, MD 01/04/2023, 6:39 AM

## 2023-01-04 NOTE — Brief Op Note (Signed)
   01/04/2023  10:57 AM  PATIENT:  James Gallegos  72 y.o. male  PRE-OPERATIVE DIAGNOSIS:  left shoulder rotator cuff arthropathy,biceps tendonitis  POST-OPERATIVE DIAGNOSIS:  left shoulder rotator cuff arthropathy,biceps tendonitis  PROCEDURE:  Procedure(s): LEFT REVERSE SHOULDER ARTHROPLASTY LEFT BICEPS TENODESIS  SURGEON:  Surgeon(s): Cammy Copa, MD  ASSISTANT: magnant pa  ANESTHESIA:   general  EBL: 150 ml    Total I/O In: 900 [I.V.:800; IV Piggyback:100] Out: 200 [Blood:200]  BLOOD ADMINISTERED: none  DRAINS: none   LOCAL MEDICATIONS USED:  vanco  SPECIMEN:  No Specimen  COUNTS:  YES  TOURNIQUET:  * No tourniquets in log *  DICTATION: .Other Dictation: Dictation Number [id number]  PLAN OF CARE: Admit for overnight observation  PATIENT DISPOSITION:  PACU - hemodynamically stable

## 2023-01-05 ENCOUNTER — Encounter (HOSPITAL_COMMUNITY): Payer: Self-pay | Admitting: Orthopedic Surgery

## 2023-01-05 ENCOUNTER — Other Ambulatory Visit (HOSPITAL_BASED_OUTPATIENT_CLINIC_OR_DEPARTMENT_OTHER): Payer: Self-pay

## 2023-01-05 DIAGNOSIS — M75102 Unspecified rotator cuff tear or rupture of left shoulder, not specified as traumatic: Secondary | ICD-10-CM | POA: Diagnosis not present

## 2023-01-05 MED ORDER — ACETAMINOPHEN 325 MG PO TABS
650.0000 mg | ORAL_TABLET | Freq: Four times a day (QID) | ORAL | 0 refills | Status: AC | PRN
  Filled 2023-01-05: qty 100, 13d supply, fill #0

## 2023-01-05 MED ORDER — OXYCODONE HCL 5 MG PO TABS
5.0000 mg | ORAL_TABLET | ORAL | 0 refills | Status: DC | PRN
  Filled 2023-01-05: qty 30, 5d supply, fill #0

## 2023-01-05 MED ORDER — DOCUSATE SODIUM 100 MG PO CAPS
100.0000 mg | ORAL_CAPSULE | Freq: Two times a day (BID) | ORAL | 0 refills | Status: AC
  Filled 2023-01-05: qty 100, 50d supply, fill #0

## 2023-01-05 MED ORDER — RIVAROXABAN 10 MG PO TABS
10.0000 mg | ORAL_TABLET | Freq: Every day | ORAL | 0 refills | Status: AC
  Filled 2023-01-05: qty 14, 14d supply, fill #0

## 2023-01-05 NOTE — Plan of Care (Signed)

## 2023-01-05 NOTE — Progress Notes (Signed)
   01/05/23 1040  TOC Brief Assessment  Insurance and Status Reviewed  Patient has primary care physician Yes  Home environment has been reviewed Resides with spouse  Prior level of function: Independent at baseline  Prior/Current Home Services No current home services  Social Determinants of Health Reivew SDOH reviewed no interventions necessary  Readmission risk has been reviewed Yes  Transition of care needs no transition of care needs at this time

## 2023-01-05 NOTE — Evaluation (Signed)
Occupational Therapy Evaluation Patient Details Name: James Gallegos MRN: [id number] DOB: 1950/07/26 Today's Date: 01/05/2023   History of Present Illness Patient is a 72 year old male who presented on 7/23 with left shoulder rotator cuff arthropathy,biceps tendonitis. patient underwent left reverese total shoulder replacementPMH: L TKA, R TKA, L ulnar nerve transposition, C5-6 discectomy, ACDF 2021, DM, DVT, cardiac murmur.   Clinical Impression   Patient is a  72 year old male who s/p shoulder replacement without functional use of right dominant upper extremity secondary to effects of surgery and interscalene block and shoulder precautions. Therapist provided education and instruction to patient in regards to exercises, precautions, positioning, donning upper extremity clothing and bathing while maintaining shoulder precautions, ice and edema management and donning/doffing sling. Patient verbalized understanding and demonstrated as needed. Patient needed assistance to donn shirt, underwear, pants, socks and shoes and provided with instruction on compensatory strategies to perform ADLs. Patient to follow up with MD for further therapy needs.        Recommendations for follow up therapy are one component of a multi-disciplinary discharge planning process, led by the attending physician.  Recommendations may be updated based on patient status, additional functional criteria and insurance authorization.   Assistance Recommended at Discharge Intermittent Supervision/Assistance  Patient can return home with the following A little help with bathing/dressing/bathroom;Assistance with cooking/housework;Assist for transportation    Functional Status Assessment  Patient has had a recent decline in their functional status and demonstrates the ability to make significant improvements in function in a reasonable and predictable amount of time.  Equipment Recommendations  None recommended by OT        Precautions / Restrictions Precautions Precautions: Shoulder Type of Shoulder Precautions: "okay for passive range of motion and active assisted range of motion.  okay for full abduction and forward elevation but limit any ER to 30 degrees max" per secure chat with Domingo Pulse PA-C on 7/24. ok for pendulums. AROM for hand wrist and elbow ok. Shoulder Interventions: Shoulder sling/immobilizer;At all times;Off for dressing/bathing/exercises Precaution Booklet Issued: Yes (comment) (handouts) Restrictions Weight Bearing Restrictions: Yes LUE Weight Bearing: Non weight bearing      Mobility Bed Mobility Overal bed mobility: Modified Independent             General bed mobility comments: with HOB slightly eleved              ADL either performed or assessed with clinical judgement   ADL Overall ADL's : Needs assistance/impaired Eating/Feeding: Modified independent;Sitting Eating/Feeding Details (indicate cue type and reason): attempted to use AROM on shoulder for self feeding using LUE with education on compensatory strategy to keep elbow down and use hand wrist elbow not shoulder movements. patient verbalized understanding. and demonstrated understanding after correction.       Upper Body Bathing Details (indicate cue type and reason): educated on ROM restrictions and compensatory strategies. patient verablzied understanding.     Upper Body Dressing : Minimal assistance;Sitting Upper Body Dressing Details (indicate cue type and reason): with increased time and cues for compensatory strategy. extensive education on wrist needing to stay over lap to prevent over 30 degrees of ER patient verbalized and demonstated understanding. Lower Body Dressing: Minimal assistance;Sit to/from stand Lower Body Dressing Details (indicate cue type and reason): to pull up pants on L side and button shorts. patient reported having basketball shorts he can wear at home. Toilet Transfer:  Modified Independent   Toileting- Clothing Manipulation and Hygiene: Minimal assistance  General ADL Comments: patient reported wife would assist him at home as needed.     Vision   Vision Assessment?: No apparent visual deficits            Pertinent Vitals/Pain Pain Assessment Pain Assessment: Faces Faces Pain Scale: Hurts a little bit Pain Location: L shoulder Pain Descriptors / Indicators: Grimacing Pain Intervention(s): Limited activity within patient's tolerance, Monitored during session, Other (comment), Premedicated before session (educated on ice machine use)     Hand Dominance Right   Extremity/Trunk Assessment Upper Extremity Assessment Upper Extremity Assessment: LUE deficits/detail LUE Deficits / Details: shoulder surgery on 7/23. patient able to AROM elbow hand and wrist on this date. educated on AAROM and PROM restrictions for shoulder. patient verbalized understanding.   Lower Extremity Assessment Lower Extremity Assessment: Overall WFL for tasks assessed   Cervical / Trunk Assessment Cervical / Trunk Assessment: Normal   Communication Communication Communication: No difficulties   Cognition Arousal/Alertness: Awake/alert Behavior During Therapy: WFL for tasks assessed/performed Overall Cognitive Status: Within Functional Limits for tasks assessed                 Shoulder Instructions Shoulder Instructions Donning/doffing shirt without moving shoulder: Patient able to independently direct caregiver Method for sponge bathing under operated UE: Patient able to independently direct caregiver Donning/doffing sling/immobilizer: Patient able to independently direct caregiver Correct positioning of sling/immobilizer: Patient able to independently direct caregiver Pendulum exercises (written home exercise program): Patient able to independently direct caregiver ROM for elbow, wrist and digits of operated UE: Patient able to independently direct  caregiver Sling wearing schedule (on at all times/off for ADL's): Patient able to independently direct caregiver Proper positioning of operated UE when showering: Patient able to independently direct caregiver Positioning of UE while sleeping: Patient able to independently direct caregiver (patients wife to assist with these things. patient ws able to demonstrate understanding on where pillows are placed from previous RTC surgery on RUE)    Home Living Family/patient expects to be discharged to:: Private residence Living Arrangements: Spouse/significant other Available Help at Discharge: Family;Available 24 hours/day          Prior Functioning/Environment Prior Level of Function : Independent/Modified Independent                OT Problem List: Decreased knowledge of precautions;Impaired UE functional use      OT Treatment/Interventions:      OT Goals(Current goals can be found in the care plan section) Acute Rehab OT Goals Patient Stated Goal: to go home OT Goal Formulation: With patient Time For Goal Achievement: 01/19/23 Potential to Achieve Goals: Fair  OT Frequency: Min 1X/week       AM-PAC OT "6 Clicks" Daily Activity     Outcome Measure Help from another person eating meals?: A Little Help from another person taking care of personal grooming?: A Little Help from another person toileting, which includes using toliet, bedpan, or urinal?: A Little Help from another person bathing (including washing, rinsing, drying)?: A Little Help from another person to put on and taking off regular upper body clothing?: A Little Help from another person to put on and taking off regular lower body clothing?: A Little 6 Click Score: 18   End of Session Equipment Utilized During Treatment: Other (comment) (sling) Nurse Communication: Other (comment) (ok to participate.)  Activity Tolerance: Patient tolerated treatment well Patient left: in bed;with call bell/phone within  reach  OT Visit Diagnosis: Unsteadiness on feet (R26.81)  Time: 1610-9604 OT Time Calculation (min): 29 min Charges:  OT General Charges $OT Visit: 1 Visit OT Evaluation $OT Eval Low Complexity: 1 Low OT Treatments $Self Care/Home Management : 8-22 mins  Rosalio Loud, MS Acute Rehabilitation Department Office# [phone]   Selinda Flavin 01/05/2023, 8:20 AM

## 2023-01-05 NOTE — Progress Notes (Signed)
PT Cancellation Note  Patient Details Name: James Gallegos MRN: [id number] DOB: 1951-04-01   Cancelled Treatment:    Reason Eval/Treat Not Completed: PT screened, no needs identified, will sign off  Blanchard Kelch PT Acute Rehabilitation Services Office [phone] Weekend pager-[phone]  Rada Hay 01/05/2023, 7:49 AM

## 2023-01-05 NOTE — Progress Notes (Signed)
  Subjective: James Gallegos is a 72 y.o. male s/p left RSA.  They are POD 1.  Pt's pain is controlled.  Patient denies any complaints of chest pain, shortness of breath, abdominal pain.  Has not ambulated yet block still in effect.  Objective: Vital signs in last 24 hours: Temp:  [97.9 F (36.6 C)-99 F (37.2 C)] 98 F (36.7 C) (07/24 0611) Pulse Rate:  [58-76] 76 (07/24 0611) Resp:  [11-18] 18 (07/24 0611) BP: (124-153)/(54-69) 124/61 (07/24 0611) SpO2:  [90 %-100 %] 97 % (07/24 0611) FiO2 (%):  [21 %] 21 % (07/23 1946)  Intake/Output from previous day: 07/23 0701 - 07/24 0700 In: 2237 [I.V.:1839.5; IV Piggyback:397.5] Out: 750 [Urine:550; Blood:200] Intake/Output this shift: No intake/output data recorded.  Exam:  No gross blood or drainage overlying the dressing 2+ radial pulse of the operative extremity Postoperative physical exam somewhat limited by interscalene block but intact EPL, FPL, finger abduction, grip strength, wrist extension, pronation/supination.   Labs: No results for input(s): "HGB" in the last 72 hours. No results for input(s): "WBC", "RBC", "HCT", "PLT" in the last 72 hours. No results for input(s): "NA", "K", "CL", "CO2", "BUN", "CREATININE", "GLUCOSE", "CALCIUM" in the last 72 hours. No results for input(s): "LABPT", "INR" in the last 72 hours.  Assessment/Plan: Pt is POD 1 s/p left RSA    -Plan to discharge to home likely today pending patient's pain  -No lifting with the operative arm  -Stay in sling except for showering/sleeping and using CPM machine at home.  No lifting with the operative arm more than 1 to 2 pounds  -Follow-up with Dr. August Saucer in clinic 2 weeks postoperatively     [facility] 01/05/2023, 8:10 AM

## 2023-01-05 NOTE — Care Management Obs Status (Signed)
MEDICARE OBSERVATION STATUS NOTIFICATION   Patient Details  Name: James Gallegos MRN: [id number] Date of Birth: 21-Apr-1951   Medicare Observation Status Notification Given:  Yes    Ewing Schlein, LCSW 01/05/2023, 11:57 AM

## 2023-01-05 NOTE — Discharge Instructions (Signed)
Information on my medicine - XARELTO (Rivaroxaban)  This medication education was reviewed with me or my healthcare representative as part of my discharge preparation.  Why was Xarelto prescribed for you? Xarelto was prescribed for you to reduce the risk of blood clots forming after orthopedic surgery. The medical term for these abnormal blood clots is venous thromboembolism (VTE).  What do you need to know about xarelto ? Take your Xarelto ONCE DAILY at the same time every day. You may take it either with or without food.  If you have difficulty swallowing the tablet whole, you may crush it and mix in applesauce just prior to taking your dose.  Take Xarelto exactly as prescribed by your doctor and DO NOT stop taking Xarelto without talking to the doctor who prescribed the medication.  Stopping without other VTE prevention medication to take the place of Xarelto may increase your risk of developing a clot.  After discharge, you should have regular check-up appointments with your healthcare provider that is prescribing your Xarelto.    What do you do if you miss a dose? If you miss a dose, take it as soon as you remember on the same day then continue your regularly scheduled once daily regimen the next day. Do not take two doses of Xarelto on the same day.   Important Safety Information A possible side effect of Xarelto is bleeding. You should call your healthcare provider right away if you experience any of the following: ? Bleeding from an injury or your nose that does not stop. ? Unusual colored urine (red or dark brown) or unusual colored stools (red or black). ? Unusual bruising for unknown reasons. ? A serious fall or if you hit your head (even if there is no bleeding).  Some medicines may interact with Xarelto and might increase your risk of bleeding while on Xarelto. To help avoid this, consult your healthcare provider or pharmacist prior to using any new prescription  or non-prescription medications, including herbals, vitamins, non-steroidal anti-inflammatory drugs (NSAIDs) and supplements.  This website has more information on Xarelto: [email or web address]

## 2023-01-07 ENCOUNTER — Telehealth: Payer: Self-pay | Admitting: *Deleted

## 2023-01-07 NOTE — Care Plan (Signed)
(  Late entry for Monday, 01/03/23). Discussed with patient his upcoming Left reverse shoulder arthroplasty with Dr. August Saucer on 01/04/23. He is an Ortho bundle patient through [facility] and is agreeable to case management. He lives with his spouse, who will be assisting after discharge. He has a home shoulder CPM delivered by [employer] prior to surgery. Reviewed post op care instructions. Will continue to follow for needs.

## 2023-01-07 NOTE — Telephone Encounter (Signed)
Ortho bundle D/C call completed. 

## 2023-01-08 DIAGNOSIS — M7522 Bicipital tendinitis, left shoulder: Secondary | ICD-10-CM

## 2023-01-08 DIAGNOSIS — M12812 Other specific arthropathies, not elsewhere classified, left shoulder: Secondary | ICD-10-CM

## 2023-01-09 NOTE — Discharge Summary (Signed)
Physician Discharge Summary      Patient ID: James Gallegos MRN: [id number] DOB/AGE: [birth year] 74 y.o.  Admit date: 01/04/2023 Discharge date: 01/05/2023  Admission Diagnoses:  Principal Problem:   S/P reverse total shoulder arthroplasty, left Active Problems:   Left rotator cuff tear arthropathy   Biceps tendonitis on left   Discharge Diagnoses:  Same  Surgeries: Procedure(s): LEFT REVERSE SHOULDER ARTHROPLASTY LEFT BICEPS TENODESIS on 01/04/2023   Consultants:   Discharged Condition: Stable  Hospital Course: James Gallegos is an 72 y.o. male who was admitted 01/04/2023 with a chief complaint of left shoulder pain, and found to have a diagnosis of left shoulder osteoarthritis.  They were brought to the operating room on 01/04/2023 and underwent the above named procedures.  Pt awoke from anesthesia without complication and was transferred to the floor. On POD1, patient's pain was controlled.  No red flag signs or symptoms.  Perform well with occupational therapy.  No difficulty with ambulation.  Discharged home on POD 1..  Pt will f/u with Dr. August Saucer in clinic in ~2 weeks.   Antibiotics given:  Anti-infectives (From admission, onward)    Start     Dose/Rate Route Frequency Ordered Stop   01/04/23 1600  ceFAZolin (ANCEF) IVPB 2g/100 mL premix        2 g 200 mL/hr over 30 Minutes Intravenous Every 8 hours 01/04/23 1048 01/05/23 0631   01/04/23 0804  vancomycin (VANCOCIN) powder  Status:  Discontinued          As needed 01/04/23 0804 01/04/23 1349   01/04/23 0600  ceFAZolin (ANCEF) IVPB 2g/100 mL premix        2 g 200 mL/hr over 30 Minutes Intravenous On call to O.R. 01/04/23 4696 01/04/23 2952     .  Recent vital signs:  Vitals:   01/05/23 0611 01/05/23 0951  BP: 124/61 (!) 147/61  Pulse: 76 (!) 57  Resp: 18 17  Temp: 98 F (36.7 C) 98 F (36.7 C)  SpO2: 97% 97%    Recent laboratory studies:  Results for orders placed or performed during the hospital  encounter of 12/24/22  Urine Culture (for pregnant, neutropenic or urologic patients or patients with an indwelling urinary catheter)   Specimen: Urine, Clean Catch  Result Value Ref Range   Specimen Description URINE, CLEAN CATCH    Special Requests NONE    Culture      NO GROWTH Performed at [facility], [address], [place], [place] [address]    Report Status 12/25/2022 FINAL   Surgical pcr screen   Specimen: Nasal Mucosa; Nasal Swab  Result Value Ref Range   MRSA, PCR NEGATIVE NEGATIVE   Staphylococcus aureus POSITIVE (A) NEGATIVE  Glucose, capillary  Result Value Ref Range   Glucose-Capillary 113 (H) 70 - 99 mg/dL  CBC  Result Value Ref Range   WBC 5.5 4.0 - 10.5 K/uL   RBC 4.52 4.22 - 5.81 MIL/uL   Hemoglobin 14.2 13.0 - 17.0 g/dL   HCT 44.0 10.2 - 72.5 %   MCV 93.4 80.0 - 100.0 fL   MCH 31.4 26.0 - 34.0 pg   MCHC 33.6 30.0 - 36.0 g/dL   RDW 36.6 44.0 - 34.7 %   Platelets 161 150 - 400 K/uL   nRBC 0.0 0.0 - 0.2 %  Basic metabolic panel  Result Value Ref Range   Sodium 137 135 - 145 mmol/L   Potassium 3.8 3.5 - 5.1 mmol/L   Chloride 106  98 - 111 mmol/L   CO2 24 22 - 32 mmol/L   Glucose, Bld 119 (H) 70 - 99 mg/dL   BUN 18 8 - 23 mg/dL   Creatinine, Ser 7.25 0.61 - 1.24 mg/dL   Calcium 8.9 8.9 - 36.6 mg/dL   GFR, Estimated >44 >03 mL/min   Anion gap 7 5 - 15  Urinalysis, Routine w reflex microscopic -Urine, Clean Catch  Result Value Ref Range   Color, Urine YELLOW YELLOW   APPearance CLEAR CLEAR   Specific Gravity, Urine 1.019 1.005 - 1.030   pH 5.0 5.0 - 8.0   Glucose, UA NEGATIVE NEGATIVE mg/dL   Hgb urine dipstick NEGATIVE NEGATIVE   Bilirubin Urine NEGATIVE NEGATIVE   Ketones, ur NEGATIVE NEGATIVE mg/dL   Protein, ur NEGATIVE NEGATIVE mg/dL   Nitrite NEGATIVE NEGATIVE   Leukocytes,Ua NEGATIVE NEGATIVE   RBC / HPF 0-5 0 - 5 RBC/hpf   WBC, UA 0-5 0 - 5 WBC/hpf   Bacteria, UA NONE SEEN NONE SEEN   Squamous Epithelial / HPF 0-5 0 - 5 /HPF    Mucus PRESENT   Hemoglobin A1c per protocol  Result Value Ref Range   Hgb A1c MFr Bld 5.6 4.8 - 5.6 %   Mean Plasma Glucose 114 mg/dL    Discharge Medications:   Allergies as of 01/05/2023       Reactions   Yellow Jacket Venom Anaphylaxis        Medication List     STOP taking these medications    aspirin 81 MG chewable tablet   HYDROcodone-acetaminophen 5-325 MG tablet Commonly known as: NORCO/VICODIN   ibuprofen 200 MG tablet Commonly known as: ADVIL   losartan-hydrochlorothiazide 100-25 MG tablet Commonly known as: HYZAAR   oseltamivir 75 MG capsule Commonly known as: Tamiflu       TAKE these medications    acetaminophen 325 MG tablet Commonly known as: TYLENOL Take 2 tablets (650 mg total) by mouth every 6 (six) hours as needed for mild pain (pain score 1-3 or temp > 100.5).   amoxicillin 500 MG capsule Commonly known as: AMOXIL Take 2,000 mg by mouth See admin instructions. Take 2000 mg by mouth   B-100 B-Complex Tabs Take 1 tablet by mouth daily.   DHEA 25 MG Caps Take 25 mg by mouth daily.   Diindolylmethane Powd Take 250 mg by mouth daily. Capsule  "DIM"   docusate sodium 100 MG capsule Commonly known as: COLACE Take 1 capsule (100 mg total) by mouth 2 (two) times daily.   EPINEPHrine 0.3 mg/0.3 mL Soaj injection Commonly known as: EpiPen 2-Pak Inject as needed What changed:  how much to take how to take this when to take this reasons to take this   KRILL OIL PO Take by mouth.   losartan 100 MG tablet Commonly known as: COZAAR Take 1 tablet (100 mg total) by mouth daily.   Lysine HCl 500 MG Tabs Take 1,000 mg by mouth daily.   Magnesium 400 MG Tabs Take 400 mg by mouth at bedtime.   methocarbamol 500 MG tablet Commonly known as: ROBAXIN Take 1 tablet (500 mg total) by mouth every 6 (six) hours as needed.   methocarbamol 500 MG tablet Commonly known as: ROBAXIN Take 1-2 tablets (500-1,000 mg total) by mouth every 6-8 hours  as needed.   oxyCODONE 5 MG immediate release tablet Commonly known as: Oxy IR/ROXICODONE Take 1 tablet (5 mg total) by mouth every 4 (four) hours as needed for moderate pain (  pain score 4-6).   predniSONE 50 MG tablet Commonly known as: DELTASONE Take 1 tablet (50 mg total) by mouth daily for 5 days   PRESCRIPTION MEDICATION Apply 1 application topically at bedtime. Testerone 13.5 % applied to shoulder Compound [facility]  135 mg/ mL   Quercetin 500 MG Caps Take 500 mg by mouth daily.   Saw Palmetto (Serenoa repens) 320 MG Caps Take 320 mg by mouth daily.   Vitamin B-12 5000 MCG Tbdp Take 5,000 mcg by mouth daily.   vitamin C 1000 MG tablet Take 3,000 mg by mouth daily.   Vitamin D-3 125 MCG (5000 UT) Tabs Take 5,000 Units by mouth daily.   VITAMIN K2 PO Take 200 mcg by mouth daily.   Xarelto 10 MG Tabs tablet Generic drug: rivaroxaban Take 1 tablet (10 mg total) by mouth daily. To prevent blood clots   zinc gluconate 50 MG tablet Take 50 mg by mouth daily.        Diagnostic Studies: DG Shoulder Left Port  Result Date: 01/04/2023 CLINICAL DATA:  Status post shoulder surgery. EXAM: LEFT SHOULDER COMPARISON:  None Available. FINDINGS: Single view of the left shoulder obtained. Reverse shoulder arthroplasty in expected alignment. No periprosthetic lucency or fracture. Recent postsurgical change includes air and edema in the soft tissues and joint space. IMPRESSION: Reverse shoulder arthroplasty without immediate postoperative complication. Electronically Signed   By: Narda Rutherford M.D.   On: 01/04/2023 13:24    Disposition: Discharge disposition: 01-Home or Self Care       Discharge Instructions     Call MD / Call 911   Complete by: As directed    If you experience chest pain or shortness of breath, CALL 911 and be transported to the hospital emergency room.  If you develope a fever above 101 F, pus (white drainage) or increased drainage or  redness at the wound, or calf pain, call your surgeon's office.   Constipation Prevention   Complete by: As directed    Drink plenty of fluids.  Prune juice may be helpful.  You may use a stool softener, such as Colace (over the counter) 100 mg twice a day.  Use MiraLax (over the counter) for constipation as needed.   Diet - low sodium heart healthy   Complete by: As directed    Discharge instructions   Complete by: As directed    You may shower, dressing is waterproof.  Do not bathe or soak the operative shoulder in a tub, pool.  Use the CPM machine 3 times a day for one hour each time.  No lifting with the operative shoulder. Continue use of the sling.  Follow-up with Dr. August Saucer in ~2 weeks on your given appointment date.  We will remove your adhesive bandage at that time.  Take xarelto daily to prevent blood clots with your history of DVT  Dental Antibiotics:  In most cases prophylactic antibiotics for Dental procdeures after total joint surgery are not necessary.  Exceptions are as follows:  1. History of prior total joint infection  2. Severely immunocompromised (Organ Transplant, cancer chemotherapy, Rheumatoid biologic meds such as Humera)  3. Poorly controlled diabetes (A1C &gt; 8.0, blood glucose over 200)  If you have one of these conditions, contact your surgeon for an antibiotic prescription, prior to your dental procedure.   Increase activity slowly as tolerated   Complete by: As directed    Post-operative opioid taper instructions:   Complete by: As directed  POST-OPERATIVE OPIOID TAPER INSTRUCTIONS: It is important to wean off of your opioid medication as soon as possible. If you do not need pain medication after your surgery it is ok to stop day one. Opioids include: Codeine, Hydrocodone(Norco, Vicodin), Oxycodone(Percocet, oxycontin) and hydromorphone amongst others.  Long term and even short term use of opiods can cause: Increased pain  response Dependence Constipation Depression Respiratory depression And more.  Withdrawal symptoms can include Flu like symptoms Nausea, vomiting And more Techniques to manage these symptoms Hydrate well Eat regular healthy meals Stay active Use relaxation techniques(deep breathing, meditating, yoga) Do Not substitute Alcohol to help with tapering If you have been on opioids for less than two weeks and do not have pain than it is ok to stop all together.  Plan to wean off of opioids This plan should start within one week post op of your joint replacement. Maintain the same interval or time between taking each dose and first decrease the dose.  Cut the total daily intake of opioids by one tablet each day Next start to increase the time between doses. The last dose that should be eliminated is the evening dose.             SignedKarenann Cai 01/09/2023, 11:00 AM

## 2023-01-10 ENCOUNTER — Other Ambulatory Visit: Payer: Self-pay | Admitting: Surgical

## 2023-01-10 ENCOUNTER — Other Ambulatory Visit (HOSPITAL_BASED_OUTPATIENT_CLINIC_OR_DEPARTMENT_OTHER): Payer: Self-pay

## 2023-01-10 ENCOUNTER — Telehealth: Payer: Self-pay | Admitting: Orthopedic Surgery

## 2023-01-10 MED ORDER — OXYCODONE HCL 5 MG PO TABS
5.0000 mg | ORAL_TABLET | Freq: Four times a day (QID) | ORAL | 0 refills | Status: DC | PRN
  Filled 2023-01-10: qty 30, 8d supply, fill #0

## 2023-01-10 NOTE — Telephone Encounter (Signed)
Lvm advising  

## 2023-01-10 NOTE — Telephone Encounter (Signed)
Pt requesting refill on Oxycodone please advise

## 2023-01-10 NOTE — Telephone Encounter (Signed)
Sent in

## 2023-01-12 ENCOUNTER — Encounter: Payer: PPO | Admitting: Surgical

## 2023-01-19 ENCOUNTER — Other Ambulatory Visit (INDEPENDENT_AMBULATORY_CARE_PROVIDER_SITE_OTHER): Payer: PPO

## 2023-01-19 ENCOUNTER — Other Ambulatory Visit: Payer: Self-pay | Admitting: *Deleted

## 2023-01-19 ENCOUNTER — Encounter: Payer: Self-pay | Admitting: Surgical

## 2023-01-19 ENCOUNTER — Ambulatory Visit: Payer: PPO | Admitting: Surgical

## 2023-01-19 DIAGNOSIS — Z96612 Presence of left artificial shoulder joint: Secondary | ICD-10-CM

## 2023-01-19 NOTE — Progress Notes (Signed)
Post-Op Visit Note   Patient: James Gallegos           Date of Birth: 06/26/1950           MRN: [id number] Visit Date: 01/19/2023 PCP: Lavada Mesi, MD   Assessment & Plan:  Chief Complaint: No chief complaint on file.  Visit Diagnoses:  1. S/P reverse total shoulder arthroplasty, left     Plan: James Gallegos is a 72 y.o. male who presents s/p left reverse shoulder arthroplasty on 01/04/2023.  Patient is doing well and pain is overall controlled.  Up to 112 degrees on CPM machine.  Denies any chest pain, SOB, fevers, chills.  No complaint of any instability symptoms.  Taking oxycodone at night but he feels that this is too strong for him so he would like to try and switch to tramadol.  He is taking Xarelto and took his last dose this morning.  He has been very mobile after surgery.  No complaint of calf pain.  Does have a history of prior DVT.  On exam, patient has range of motion 30 degrees external rotation, 65 degrees abduction, 120 degrees forward elevation.  Good early subscapularis strength on exam today..  Intact EPL, FPL, finger abduction, finger adduction, pronation/supination, bicep, tricep, deltoid of operative extremity.  Axillary nerve intact with deltoid firing.  Incision is healing well without evidence of infection or dehiscence.  Incision was made sure to be covered with Steri-Strips from the proximal to distal aspect of the length of the incision.  2+ radial pulse of the operative extremity  Plan is discontinue sling.  Okay to very lightly lift with the operative extremity but no lifting anything heavier than a coffee cup or cell phone.  Start physical therapy to focus on passive range of motion and active range of motion with deltoid isometrics.  Do not want to externally rotate past 30 degrees to protect subscapularis repair.  Follow-up in 4 weeks for clinical recheck.  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  He will go  back to work.  Ultrasound to rule out DVT was ordered.  Follow-Up Instructions: Return in about 4 weeks (around 02/16/2023).   Orders:  Orders Placed This Encounter  Procedures   XR Shoulder Left   No orders of the defined types were placed in this encounter.   Imaging: No results found.  PMFS History: Patient Active Problem List   Diagnosis Date Noted   Left rotator cuff tear arthropathy 01/08/2023   Biceps tendonitis on left 01/08/2023   S/P reverse total shoulder arthroplasty, left 01/04/2023   Status post total replacement of left hip 12/18/2021   Pain in left hip 09/03/2021   Neuropathy, 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   Cervical spondylosis with myelopathy and radiculopathy 04/14/2020   Impingement syndrome of right shoulder 10/10/2019   Low back pain 10/10/2019   Irritation of ulnar nerve, left 08/28/2019   Class 2 obesity due to excess calories without serious comorbidity with body mass index (BMI) of 39.0 to 39.9 in adult 01/08/2019   Pain in right elbow 12/06/2018   Primary osteoarthritis of left knee 07/25/2018   History of left knee replacement 07/25/2018   Right foot pain 06/22/2018   Acute left-sided low back pain with left-sided sciatica 06/22/2018   Osteoarthritis 11/21/2017   Hyperlipidemia 11/21/2017   Low testosterone 09/26/2016   Healthcare maintenance 09/26/2016   S/P total knee replacement using cement, right 06/01/2016  Sleep apnea 05/19/2016   Hypertension 05/19/2016   Past Medical History:  Diagnosis Date   Arthritis    back , R foot -   Deep vein thrombosis (HCC)    after knee surgery in 2004   Diabetes mellitus without complication (HCC)    Patients not aware of being dx   Hypertension    Murmur, cardiac    pt. reports its difficult to auscultate    Sleep apnea    CPAP- last study- 2014 cpap set on 13 per pt   Wears glasses 11/20/2020   for reading    Family History  Problem Relation Age of Onset   Dementia Mother    Alzheimer's disease  Father    Healthy Brother    Colon cancer Neg Hx    Esophageal cancer Neg Hx    Rectal cancer Neg Hx    Stomach cancer Neg Hx    Cancer Neg Hx    Diabetes Neg Hx     Past Surgical History:  Procedure Laterality Date   ANTERIOR CERVICAL DECOMP/DISCECTOMY FUSION N/A 04/14/2020   Procedure: ANTERIOR CERVICAL DECOMPRESSION/DISCECTOMY FUSION, INTERBODY PROSTHESIS, PLATE/SCREWS CERVICAL THREE-FOUR,CERVICAL FOUR-FIVE,CERVICAL SIX-SEVEN;  Surgeon: Tressie Stalker, MD;  Location: [facility];  Service: Neurosurgery;  Laterality: N/A;  anterior   BICEPT TENODESIS Left 01/04/2023   Procedure: LEFT BICEPS TENODESIS;  Surgeon: Cammy Copa, MD;  Location: [facility];  Service: Orthopedics;  Laterality: Left;   CERVICAL SPINE SURGERY N/A 1990   C5-6 discectomy plates and screws in neck per pt   colonscopy  2017   polyps removed per pt follow up in 5 years   FINGER SURGERY Right 2008   x 3 index finger   KNEE ARTHROSCOPY Bilateral    L 2004, R 2005   MASS EXCISION Right 01/18/2017   Procedure: EXCISION BENIGN RIGHT NECK MASS;  Surgeon: Drema Halon, MD;  Location: [facility];  Service: ENT;  Laterality: Right;   MASS EXCISION Bilateral 03/23/2019   Procedure: EXCISIONS OF RIGHT CHEEK LESION, RIGHT POSTERIOR NECK LIPOMA  AND LEFT NECK NODULE;  Surgeon: Drema Halon, MD;  Location: [facility];  Service: ENT;  Laterality: Bilateral;   NASAL FRACTURE SURGERY  1980   REVERSE SHOULDER ARTHROPLASTY Left 01/04/2023   Procedure: LEFT REVERSE SHOULDER ARTHROPLASTY;  Surgeon: Cammy Copa, MD;  Location: [facility];  Service: Orthopedics;  Laterality: Left;   TOTAL HIP ARTHROPLASTY Left 12/18/2021   Procedure: LEFT TOTAL HIP ARTHROPLASTY ANTERIOR APPROACH;  Surgeon: Kathryne Hitch, MD;  Location: [facility];  Service: Orthopedics;  Laterality: Left;   TOTAL KNEE ARTHROPLASTY Right 06/01/2016   Procedure: TOTAL KNEE ARTHROPLASTY;  Surgeon: Valeria Batman,  MD;  [facility];  Service: Orthopedics;  Laterality: Right;   TOTAL KNEE ARTHROPLASTY Left 07/25/2018   Procedure: LEFT TOTAL KNEE ARTHROPLASTY;  Surgeon: Valeria Batman, MD;  Location: [facility];  Service: Orthopedics;  Laterality: Left;   ULNAR NERVE TRANSPOSITION Left 11/25/2020   Procedure: ulnar nerve decompression left elbow;  Surgeon: Valeria Batman, MD;  Location: [facility];  Service: Orthopedics;  Laterality: Left;   Social History   Occupational History   Not on file  Tobacco Use   Smoking status: Never   Smokeless tobacco: Never  Vaping Use   Vaping status: Never Used  Substance and Sexual Activity   Alcohol use: Yes    Comment: rarely wine   Drug use: Never   Sexual activity: Not on file

## 2023-01-20 ENCOUNTER — Other Ambulatory Visit (HOSPITAL_BASED_OUTPATIENT_CLINIC_OR_DEPARTMENT_OTHER): Payer: Self-pay

## 2023-01-20 ENCOUNTER — Ambulatory Visit: Payer: PPO | Admitting: Rehabilitative and Restorative Service Providers"

## 2023-01-20 ENCOUNTER — Encounter: Payer: Self-pay | Admitting: Rehabilitative and Restorative Service Providers"

## 2023-01-20 ENCOUNTER — Other Ambulatory Visit: Payer: Self-pay

## 2023-01-20 DIAGNOSIS — M6281 Muscle weakness (generalized): Secondary | ICD-10-CM

## 2023-01-20 DIAGNOSIS — M25512 Pain in left shoulder: Secondary | ICD-10-CM | POA: Diagnosis not present

## 2023-01-20 DIAGNOSIS — R600 Localized edema: Secondary | ICD-10-CM

## 2023-01-20 DIAGNOSIS — G8929 Other chronic pain: Secondary | ICD-10-CM | POA: Diagnosis not present

## 2023-01-20 DIAGNOSIS — R293 Abnormal posture: Secondary | ICD-10-CM

## 2023-01-20 MED ORDER — TRAMADOL HCL 50 MG PO TABS
50.0000 mg | ORAL_TABLET | Freq: Four times a day (QID) | ORAL | 0 refills | Status: DC | PRN
  Filled 2023-01-20 (×2): qty 30, 8d supply, fill #0

## 2023-01-20 NOTE — Therapy (Signed)
OUTPATIENT PHYSICAL THERAPY EVALUATION   Patient Name: James Gallegos MRN: [id number] DOB:March 01, 1951, 72 y.o., male Today's Date: 01/20/2023  END OF SESSION:  PT End of Session - 01/20/23 1008     Visit Number 1    Number of Visits 20    Date for PT Re-Evaluation 03/31/23    Authorization Type [employer]    PT Start Time 1011    PT Stop Time 1051    PT Time Calculation (min) 40 min    Activity Tolerance Patient tolerated treatment well    Behavior During Therapy WFL for tasks assessed/performed             Past Medical History:  Diagnosis Date   Arthritis    back , R foot -   Deep vein thrombosis (HCC)    after knee surgery in 2004   Diabetes mellitus without complication (HCC)    Patients not aware of being dx   Hypertension    Murmur, cardiac    pt. reports its difficult to auscultate    Sleep apnea    CPAP- last study- 2014 cpap set on 13 per pt   Wears glasses 11/20/2020   for reading   Past Surgical History:  Procedure Laterality Date   ANTERIOR CERVICAL DECOMP/DISCECTOMY FUSION N/A 04/14/2020   Procedure: ANTERIOR CERVICAL DECOMPRESSION/DISCECTOMY FUSION, INTERBODY PROSTHESIS, PLATE/SCREWS CERVICAL THREE-FOUR,CERVICAL FOUR-FIVE,CERVICAL SIX-SEVEN;  Surgeon: Tressie Stalker, MD;  Location: [facility] OR;  Service: Neurosurgery;  Laterality: N/A;  anterior   BICEPT TENODESIS Left 01/04/2023   Procedure: LEFT BICEPS TENODESIS;  Surgeon: Cammy Copa, MD;  Location: [facility];  Service: Orthopedics;  Laterality: Left;   CERVICAL SPINE SURGERY N/A 1990   C5-6 discectomy plates and screws in neck per pt   colonscopy  2017   polyps removed per pt follow up in 5 years   FINGER SURGERY Right 2008   x 3 index finger   KNEE ARTHROSCOPY Bilateral    L 2004, R 2005   MASS EXCISION Right 01/18/2017   Procedure: EXCISION BENIGN RIGHT NECK MASS;  Surgeon: Drema Halon, MD;  Location: [facility];  Service: ENT;  Laterality: Right;   MASS  EXCISION Bilateral 03/23/2019   Procedure: EXCISIONS OF RIGHT CHEEK LESION, RIGHT POSTERIOR NECK LIPOMA  AND LEFT NECK NODULE;  Surgeon: Drema Halon, MD;  Location: [facility];  Service: ENT;  Laterality: Bilateral;   NASAL FRACTURE SURGERY  1980   REVERSE SHOULDER ARTHROPLASTY Left 01/04/2023   Procedure: LEFT REVERSE SHOULDER ARTHROPLASTY;  Surgeon: Cammy Copa, MD;  Location: [facility];  Service: Orthopedics;  Laterality: Left;   TOTAL HIP ARTHROPLASTY Left 12/18/2021   Procedure: LEFT TOTAL HIP ARTHROPLASTY ANTERIOR APPROACH;  Surgeon: Kathryne Hitch, MD;  Location: [facility];  Service: Orthopedics;  Laterality: Left;   TOTAL KNEE ARTHROPLASTY Right 06/01/2016   Procedure: TOTAL KNEE ARTHROPLASTY;  Surgeon: Valeria Batman, MD;  [facility] OR;  Service: Orthopedics;  Laterality: Right;   TOTAL KNEE ARTHROPLASTY Left 07/25/2018   Procedure: LEFT TOTAL KNEE ARTHROPLASTY;  Surgeon: Valeria Batman, MD;  Location: [facility];  Service: Orthopedics;  Laterality: Left;   ULNAR NERVE TRANSPOSITION Left 11/25/2020   Procedure: ulnar nerve decompression left elbow;  Surgeon: Valeria Batman, MD;  Location: [facility] OR;  Service: Orthopedics;  Laterality: Left;   Patient Active Problem List   Diagnosis Date Noted   Left rotator cuff tear arthropathy 01/08/2023   Biceps tendonitis on left 01/08/2023  S/P reverse total shoulder arthroplasty, left 01/04/2023   Status post total replacement of left hip 12/18/2021   Pain in left hip 09/03/2021   Neuropathy, 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   Cervical spondylosis with myelopathy and radiculopathy 04/14/2020   Impingement syndrome of right shoulder 10/10/2019   Low back pain 10/10/2019   Irritation of ulnar nerve, left 08/28/2019   Class 2 obesity due to excess calories without serious comorbidity with body mass index (BMI) of 39.0 to 39.9 in adult 01/08/2019   Pain in right elbow 12/06/2018   Primary osteoarthritis of left knee  07/25/2018   History of left knee replacement 07/25/2018   Right foot pain 06/22/2018   Acute left-sided low back pain with left-sided sciatica 06/22/2018   Osteoarthritis 11/21/2017   Hyperlipidemia 11/21/2017   Low testosterone 09/26/2016   Healthcare maintenance 09/26/2016   S/P total knee replacement using cement, right 06/01/2016   Sleep apnea 05/19/2016   Hypertension 05/19/2016    PCP: Lavada Mesi MD  REFERRING PROVIDER: Cammy Copa, MD  REFERRING DIAG: [phone] (ICD-10-CM) - S/P reverse total shoulder arthroplasty, left  THERAPY DIAG:  Chronic left shoulder pain - Plan: PT plan of care cert/re-cert  Muscle weakness (generalized) - Plan: PT plan of care cert/re-cert  Abnormal posture - Plan: PT plan of care cert/re-cert  Localized edema - Plan: PT plan of care cert/re-cert  Rationale for Evaluation and Treatment: Rehabilitation  ONSET DATE: surgery 01/04/2023  SUBJECTIVE:                                                                                                                                                                                      SUBJECTIVE STATEMENT: Pt s/p recent reverse Lt shoulder replacement on 01/04/2023.  Pt indicated doing fairly well and indicated good follow up yesterday.  Has CPM machine and using with pain afterward.  Recently took sling off and felt some discomfort/pain after.   Pt indicated having some trouble with sleeping, Is in the bed.   PERTINENT HISTORY: Previous C5-6 discectomy, presenting for C3-4, C4-5, C6-7 ACDF due to pain. PMH includes: arthritis, HTN, PNA, sleep apnea.   PAIN:  NPRS scale: at worst 7-8 /10, 4-5/10 upon arrival Pain location: Lt shoulder  Pain description: ache, burning  Aggravating factors: after CPM use Relieving factors: ice  PRECAUTIONS: Avoid active IR strengthening, ER past 30 deg 4-6 weeks  WEIGHT BEARING RESTRICTIONS: No  FALLS:  Has patient fallen in last 6 months? 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  PLOF: Independent, Rt handed.  Reading, yard work (  limited). Walking.   PATIENT GOALS:Reduce pain, get range better.   OBJECTIVE:   PATIENT SURVEYS:  01/20/2023 FOTO intake:   50  predicted:  65  COGNITION: 01/20/2023 Overall cognitive status: WFL     SENSATION: 01/20/2023 WFL  POSTURE: 01/20/2023 Rounded shoulders   Localized edema Lt shoulder post surgery.   UPPER EXTREMITY ROM:   ROM Right 01/20/2023 Left 01/20/2023  Shoulder flexion WFL 90 AROM in supine  120 PROM in supine  Shoulder extension    Shoulder abduction WFL 90 PROM in supine   Shoulder adduction    Shoulder internal rotation    Shoulder external rotation  PROM 10 deg  in 30 deg abduction   Elbow flexion    Elbow extension    Wrist flexion    Wrist extension    Wrist ulnar deviation    Wrist radial deviation    Wrist pronation    Wrist supination    (Blank rows = not tested)  UPPER EXTREMITY MMT:  MMT Right 01/20/2023 Left 01/20/2023  Held shoulder based MMT due to surgery protocol  Shoulder flexion 5/5   Shoulder extension    Shoulder abduction 5/5   Shoulder adduction    Shoulder internal rotation 5/5   Shoulder external rotation 5/5   Middle trapezius    Lower trapezius    Elbow flexion 5/5 5/5  Elbow extension 5/5 5/5  Wrist flexion    Wrist extension    Wrist ulnar deviation    Wrist radial deviation    Wrist pronation    Wrist supination    Grip strength (lbs)    (Blank rows = not tested)  SHOULDER SPECIAL TESTS: 01/20/2023 (+) Shrug noted in active Lt shoulder flexion attempt vs. Gravity.   JOINT MOBILITY TESTING:  01/20/2023 Guarding noted but not specific joint mobility noted.   PALPATION:  01/20/2023 General tenderness around Lt shoulder.                                                                                                                                                                                                    TODAY'S TREATMENT:  DATE: 01/20/2023 Therex:    HEP instruction/performance c cues for techniques, handout provided.  Trial set performed of each for comprehension and symptom assessment.  See below for exercise list  Manual  Lt arm PROM, g2 inferior/superior glides Lt GH joint.   PATIENT EDUCATION: 01/20/2023 Education details: HEP, POC Person educated: Patient Education method: Programmer, multimedia, Demonstration, Verbal cues, and Handouts Education comprehension: verbalized understanding, returned demonstration, and verbal cues required  HOME EXERCISE PROGRAM: Access Code: [email or web address] URL: [email or web address] Date: 01/20/2023 Prepared by: Chyrel Masson  Exercises - Supine Shoulder Flexion Extension AAROM with Dowel  - 1-2 x daily - 7 x weekly - 1-2 sets - 10-15 reps - 3 hold - Supine Shoulder Protraction with Dowel  - 1-2 x daily - 7 x weekly - 1-2 sets - 10-15 reps - 3-5 hold - Seated Shoulder Flexion Self PROM  - 1-2 x daily - 7 x weekly - 1-2 sets - 10 reps - 3 hold - Seated Scapular Retraction  - 3-5 x daily - 7 x weekly - 1 sets - 10 reps - 3-5 hold  ASSESSMENT:  CLINICAL IMPRESSION: Patient is a 72 y.o. who comes to clinic with complaints of Lt shoulder pain s/p recent TSA on 01/04/2023 with mobility, strength and movement coordination deficits that impair their ability to perform usual daily and recreational functional activities without increase difficulty/symptoms at this time.  Patient to benefit from skilled PT services to address impairments and limitations to improve to previous level of function without restriction secondary to condition.   OBJECTIVE IMPAIRMENTS: decreased activity tolerance, decreased coordination, decreased endurance, decreased mobility, decreased ROM,  decreased strength, increased edema, increased fascial restrictions, impaired perceived functional ability, impaired flexibility, impaired UE functional use, postural dysfunction, and pain.   ACTIVITY LIMITATIONS: carrying, lifting, sleeping, bed mobility, bathing, dressing, self feeding, reach over head, and hygiene/grooming  PARTICIPATION LIMITATIONS: meal prep, cleaning, laundry, interpersonal relationship, driving, community activity, occupation, and yard work  PERSONAL FACTORS:  No specific limitations  are also affecting patient's functional outcome.   REHAB POTENTIAL: Good  CLINICAL DECISION MAKING: Stable/uncomplicated  EVALUATION COMPLEXITY: Low   GOALS: Goals reviewed with patient? Yes  SHORT TERM GOALS: (target date for Short term goals are 3 weeks 02/10/2023)  1.Patient will demonstrate independent use of home exercise program to maintain progress from in clinic treatments. Goal status: New  LONG TERM GOALS: (target dates for all long term goals are 10 weeks  03/31/2023 )   1. Patient will demonstrate/report pain at worst less than or equal to 2/10 to facilitate minimal limitation in daily activity secondary to pain symptoms. Goal status: New   2. Patient will demonstrate independent use of home exercise program to facilitate ability to maintain/progress functional gains from skilled physical therapy services. Goal status: New   3. Patient will demonstrate FOTO outcome > or = 65 % to indicate reduced disability due to condition. Goal status: New   4.  Patient will demonstrate Lt UE MMT 4/5 or greater  throughout to facilitate lifting, reaching, carrying at [facility] in daily activity.   Goal status: New   5.  Patient will demonstrate Lt  [facility] joint AROM WFL s symptoms to facilitate usual overhead reaching, self care, dressing at PLOF.    Goal status: New   6.  Patient will demonstrate/report ability to perform routine yard work at [employer] s limitation.  Goal status: New    PLAN:  PT FREQUENCY: 1-2x/week (start at 2)  PT DURATION: 10 weeks  PLANNED INTERVENTIONS: Therapeutic  exercises, Therapeutic activity, Neuro Muscular re-education, Balance training, Gait training, Patient/Family education, Joint mobilization, Stair training, DME instructions, Dry Needling, Electrical stimulation, Traction, Cryotherapy, vasopneumatic device Moist heat, Taping, Ultrasound, Ionotophoresis 4mg /ml Dexamethasone, and aquatic therapy ,Manual therapy.  All included unless contraindicated  PLAN FOR NEXT SESSION: Review HEP knowledge/results.   Progressive AAROM/AROM improvements in gravity reduced positioning.  Pt had desire to try out pulleys for possible home use.  Avoid active IR strengthening, ER past 30 deg 4-6 weeks   Chyrel Masson, PT, DPT, OCS, ATC 01/20/23  10:57 AM

## 2023-01-22 ENCOUNTER — Encounter: Payer: Self-pay | Admitting: Surgical

## 2023-01-26 NOTE — Therapy (Signed)
OUTPATIENT PHYSICAL THERAPY TREATMENT   Patient Name: James Gallegos MRN: [id number] DOB:[birth year], 72 y.o., male Today's Date: 01/27/2023  END OF SESSION:  PT End of Session - 01/27/23 0818     Visit Number 2    Number of Visits 20    Date for PT Re-Evaluation 03/31/23    Authorization Type [employer]    PT Start Time 0800    PT Stop Time 0840    PT Time Calculation (min) 40 min    Activity Tolerance Patient tolerated treatment well    Behavior During Therapy [facility] for tasks assessed/performed              Past Medical History:  Diagnosis Date   Arthritis    back , R foot -   Deep vein thrombosis (HCC)    after knee surgery in 2004   Diabetes mellitus without complication (HCC)    Patients not aware of being dx   Hypertension    Murmur, cardiac    pt. reports its difficult to auscultate    Sleep apnea    CPAP- last study- 2014 cpap set on 13 per pt   Wears glasses 11/20/2020   for reading   Past Surgical History:  Procedure Laterality Date   ANTERIOR CERVICAL DECOMP/DISCECTOMY FUSION N/A 04/14/2020   Procedure: ANTERIOR CERVICAL DECOMPRESSION/DISCECTOMY FUSION, INTERBODY PROSTHESIS, PLATE/SCREWS CERVICAL THREE-FOUR,CERVICAL FOUR-FIVE,CERVICAL SIX-SEVEN;  Surgeon: Tressie Stalker, MD;  Location: [facility] OR;  Service: Neurosurgery;  Laterality: N/A;  anterior   BICEPT TENODESIS Left 01/04/2023   Procedure: LEFT BICEPS TENODESIS;  Surgeon: Cammy Copa, MD;  Location: [facility] ORS;  Service: Orthopedics;  Laterality: Left;   CERVICAL SPINE SURGERY N/A 1990   C5-6 discectomy plates and screws in neck per pt   colonscopy  2017   polyps removed per pt follow up in 5 years   FINGER SURGERY Right 2008   x 3 index finger   KNEE ARTHROSCOPY Bilateral    L 2004, R 2005   MASS EXCISION Right 01/18/2017   Procedure: EXCISION BENIGN RIGHT NECK MASS;  Surgeon: Drema Halon, MD;  Location: [facility];  Service: ENT;  Laterality: Right;    MASS EXCISION Bilateral 03/23/2019   Procedure: EXCISIONS OF RIGHT CHEEK LESION, RIGHT POSTERIOR NECK LIPOMA  AND LEFT NECK NODULE;  Surgeon: Drema Halon, MD;  Location: [facility];  Service: ENT;  Laterality: Bilateral;   NASAL FRACTURE SURGERY  1980   REVERSE SHOULDER ARTHROPLASTY Left 01/04/2023   Procedure: LEFT REVERSE SHOULDER ARTHROPLASTY;  Surgeon: Cammy Copa, MD;  Location: [facility];  Service: Orthopedics;  Laterality: Left;   TOTAL HIP ARTHROPLASTY Left 12/18/2021   Procedure: LEFT TOTAL HIP ARTHROPLASTY ANTERIOR APPROACH;  Surgeon: Kathryne Hitch, MD;  Location: [facility];  Service: Orthopedics;  Laterality: Left;   TOTAL KNEE ARTHROPLASTY Right 06/01/2016   Procedure: TOTAL KNEE ARTHROPLASTY;  Surgeon: Valeria Batman, MD;  Location: [facility] OR;  Service: Orthopedics;  Laterality: Right;   TOTAL KNEE ARTHROPLASTY Left 07/25/2018   Procedure: LEFT TOTAL KNEE ARTHROPLASTY;  Surgeon: Valeria Batman, MD;  Location: [facility] OR;  Service: Orthopedics;  Laterality: Left;   ULNAR NERVE TRANSPOSITION Left 11/25/2020   Procedure: ulnar nerve decompression left elbow;  Surgeon: Valeria Batman, MD;  Location: [facility] OR;  Service: Orthopedics;  Laterality: Left;   Patient Active Problem List   Diagnosis Date Noted   Left rotator cuff tear arthropathy 01/08/2023   Biceps tendonitis on left 01/08/2023  S/P reverse total shoulder arthroplasty, left 01/04/2023   Status post total replacement of left hip 12/18/2021   Pain in left hip 09/03/2021   Neuropathy, 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   Cervical spondylosis with myelopathy and radiculopathy 04/14/2020   Impingement syndrome of right shoulder 10/10/2019   Low back pain 10/10/2019   Irritation of ulnar nerve, left 08/28/2019   Class 2 obesity due to excess calories without serious comorbidity with body mass index (BMI) of 39.0 to 39.9 in adult 01/08/2019   Pain in right elbow 12/06/2018   Primary osteoarthritis of left  knee 07/25/2018   History of left knee replacement 07/25/2018   Right foot pain 06/22/2018   Acute left-sided low back pain with left-sided sciatica 06/22/2018   Osteoarthritis 11/21/2017   Hyperlipidemia 11/21/2017   Low testosterone 09/26/2016   Healthcare maintenance 09/26/2016   S/P total knee replacement using cement, right 06/01/2016   Sleep apnea 05/19/2016   Hypertension 05/19/2016    PCP: Lavada Mesi MD  REFERRING PROVIDER: Cammy Copa, MD  REFERRING DIAG: [phone] (ICD-10-CM) - S/P reverse total shoulder arthroplasty, left  THERAPY DIAG:  Chronic left shoulder pain  Muscle weakness (generalized)  Abnormal posture  Localized edema  Rationale for Evaluation and Treatment: Rehabilitation  ONSET DATE: surgery 01/04/2023  SUBJECTIVE:                                                                                                                                                                                      SUBJECTIVE STATEMENT: Pt indicated having 3/10 or so symptoms, achy/burning.  Reported getting some improvement in movement.    PERTINENT HISTORY: Previous C5-6 discectomy, presenting for C3-4, C4-5, C6-7 ACDF due to pain. PMH includes: arthritis, HTN, PNA, sleep apnea.   PAIN:  NPRS scale: at worst 7-8 /10, 4-5/10 upon arrival Pain location: Lt shoulder  Pain description: ache, burning  Aggravating factors: after CPM use Relieving factors: ice  PRECAUTIONS: Avoid active IR strengthening, ER past 30 deg 4-6 weeks  WEIGHT BEARING RESTRICTIONS: No  FALLS:  Has patient fallen in last 6 months? 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  PLOF: Independent, Rt handed.  Reading, yard work (limited). Walking.   PATIENT GOALS:Reduce pain, get range better.   OBJECTIVE:   PATIENT SURVEYS:  01/20/2023 FOTO intake:   50  predicted:   65  COGNITION: 01/20/2023 Overall cognitive status: WFL     SENSATION: 01/20/2023 WFL  POSTURE: 01/20/2023 Rounded shoulders   Localized edema Lt shoulder post surgery.   UPPER EXTREMITY ROM:   ROM Right 01/20/2023  Left 01/20/2023 Left 01/27/2023  Shoulder flexion WFL 90 AROM in supine  120 PROM in supine 140 deg PROM in supine  Shoulder extension     Shoulder abduction WFL 90 PROM in supine    Shoulder adduction     Shoulder internal rotation     Shoulder external rotation  PROM 10 deg  in 30 deg abduction    Elbow flexion     Elbow extension     Wrist flexion     Wrist extension     Wrist ulnar deviation     Wrist radial deviation     Wrist pronation     Wrist supination     (Blank rows = not tested)  UPPER EXTREMITY MMT:  MMT Right 01/20/2023 Left 01/20/2023  Held shoulder based MMT due to surgery protocol  Shoulder flexion 5/5   Shoulder extension    Shoulder abduction 5/5   Shoulder adduction    Shoulder internal rotation 5/5   Shoulder external rotation 5/5   Middle trapezius    Lower trapezius    Elbow flexion 5/5 5/5  Elbow extension 5/5 5/5  Wrist flexion    Wrist extension    Wrist ulnar deviation    Wrist radial deviation    Wrist pronation    Wrist supination    Grip strength (lbs)    (Blank rows = not tested)  SHOULDER SPECIAL TESTS: 01/20/2023 (+) Shrug noted in active Lt shoulder flexion attempt vs. Gravity.   JOINT MOBILITY TESTING:  01/20/2023 Guarding noted but not specific joint mobility noted.   PALPATION:  01/20/2023 General tenderness around Lt shoulder.                                                                                                                                                                                                  TODAY'S TREATMENT:                                                                                                       DATE: 01/27/2023 Therex: Supine AAROM flexion with 1 lb bar x 15 Supine  AROM flexion Lt x 10 Supine Lt shoulder protraction 3 sec  hold x 10 in 90 deg flexion  Seated AROM 1 lb bar flexion x 10 (cues for use at work in sitting positioning) Seated pulleys flexion, scaption 3 mins each way with 2-3 sec hold at top.  Eccentric lowering focus with outstretched arm.  Tband rows Green band 2 x 15 bilateral c scap retraction  Tband gh ext green band 2 x 15 bilateral with 2 sec hold   Manual  Lt arm PROM, g2 inferior/superior glides Lt GH joint.    TODAY'S TREATMENT:                                                                                                       DATE: 01/20/2023 Therex:    HEP instruction/performance c cues for techniques, handout provided.  Trial set performed of each for comprehension and symptom assessment.  See below for exercise list  Manual  Lt arm PROM, g2 inferior/superior glides Lt GH joint.   PATIENT EDUCATION: 01/20/2023 Education details: HEP, POC Person educated: Patient Education method: Programmer, multimedia, Demonstration, Verbal cues, and Handouts Education comprehension: verbalized understanding, returned demonstration, and verbal cues required  HOME EXERCISE PROGRAM: Access Code: [email or web address] URL: [email or web address] Date: 01/20/2023 Prepared by: Chyrel Masson  Exercises - Supine Shoulder Flexion Extension AAROM with Dowel  - 1-2 x daily - 7 x weekly - 1-2 sets - 10-15 reps - 3 hold - Supine Shoulder Protraction with Dowel  - 1-2 x daily - 7 x weekly - 1-2 sets - 10-15 reps - 3-5 hold - Seated Shoulder Flexion Self PROM  - 1-2 x daily - 7 x weekly - 1-2 sets - 10 reps - 3 hold - Seated Scapular Retraction  - 3-5 x daily - 7 x weekly - 1 sets - 10 reps - 3-5 hold  ASSESSMENT:  CLINICAL IMPRESSION: Good initial gains noted in tolerance and quantity in elevation movement available.   Plan to continue skilled PT services to improve overall functional ability.   OBJECTIVE IMPAIRMENTS: decreased activity tolerance,  decreased coordination, decreased endurance, decreased mobility, decreased ROM, decreased strength, increased edema, increased fascial restrictions, impaired perceived functional ability, impaired flexibility, impaired UE functional use, postural dysfunction, and pain.   ACTIVITY LIMITATIONS: carrying, lifting, sleeping, bed mobility, bathing, dressing, self feeding, reach over head, and hygiene/grooming  PARTICIPATION LIMITATIONS: meal prep, cleaning, laundry, interpersonal relationship, driving, community activity, occupation, and yard work  PERSONAL FACTORS:  No specific limitations  are also affecting patient's functional outcome.   REHAB POTENTIAL: Good  CLINICAL DECISION MAKING: Stable/uncomplicated  EVALUATION COMPLEXITY: Low   GOALS: Goals reviewed with patient? Yes  SHORT TERM GOALS: (target date for Short term goals are 3 weeks 02/10/2023)  1.Patient will demonstrate independent use of home exercise program to maintain progress from in clinic treatments. Goal status: on going 01/27/2023  LONG TERM GOALS: (target dates for all long term goals are 10 weeks  03/31/2023 )   1. Patient will demonstrate/report pain at worst less than or equal to 2/10 to facilitate minimal limitation in daily activity secondary to pain symptoms. Goal status: New   2. Patient  will demonstrate independent use of home exercise program to facilitate ability to maintain/progress functional gains from skilled physical therapy services. Goal status: New   3. Patient will demonstrate FOTO outcome > or = 65 % to indicate reduced disability due to condition. Goal status: New   4.  Patient will demonstrate Lt UE MMT 4/5 or greater  throughout to facilitate lifting, reaching, carrying at [facility] in daily activity.   Goal status: New   5.  Patient will demonstrate Lt  [facility] joint AROM WFL s symptoms to facilitate usual overhead reaching, self care, dressing at PLOF.    Goal status: New   6.  Patient will  demonstrate/report ability to perform routine yard work at [employer] s limitation.  Goal status: New   PLAN:  PT FREQUENCY: 1-2x/week (start at 2)  PT DURATION: 10 weeks  PLANNED INTERVENTIONS: Therapeutic exercises, Therapeutic activity, Neuro Muscular re-education, Balance training, Gait training, Patient/Family education, Joint mobilization, Stair training, DME instructions, Dry Needling, Electrical stimulation, Traction, Cryotherapy, vasopneumatic device Moist heat, Taping, Ultrasound, Ionotophoresis 4mg /ml Dexamethasone, and aquatic therapy ,Manual therapy.  All included unless contraindicated  PLAN FOR NEXT SESSION:  Add tband rows/ gh ext to home program if good response today.  Continued AAROM/AROM transitioning as able.   Avoid active IR strengthening, ER past 30 deg 4-6 weeks   Chyrel Masson, PT, DPT, OCS, ATC 01/27/23  8:38 AM

## 2023-01-27 ENCOUNTER — Ambulatory Visit: Payer: PPO | Admitting: Rehabilitative and Restorative Service Providers"

## 2023-01-27 ENCOUNTER — Encounter: Payer: Self-pay | Admitting: Rehabilitative and Restorative Service Providers"

## 2023-01-27 DIAGNOSIS — G8929 Other chronic pain: Secondary | ICD-10-CM | POA: Diagnosis not present

## 2023-01-27 DIAGNOSIS — M6281 Muscle weakness (generalized): Secondary | ICD-10-CM | POA: Diagnosis not present

## 2023-01-27 DIAGNOSIS — M25512 Pain in left shoulder: Secondary | ICD-10-CM | POA: Diagnosis not present

## 2023-01-27 DIAGNOSIS — R600 Localized edema: Secondary | ICD-10-CM

## 2023-01-27 DIAGNOSIS — R293 Abnormal posture: Secondary | ICD-10-CM | POA: Diagnosis not present

## 2023-02-01 ENCOUNTER — Encounter: Payer: Self-pay | Admitting: Physical Therapy

## 2023-02-01 ENCOUNTER — Other Ambulatory Visit: Payer: Self-pay | Admitting: Surgical

## 2023-02-01 ENCOUNTER — Other Ambulatory Visit (HOSPITAL_BASED_OUTPATIENT_CLINIC_OR_DEPARTMENT_OTHER): Payer: Self-pay

## 2023-02-01 ENCOUNTER — Ambulatory Visit: Payer: PPO | Admitting: Physical Therapy

## 2023-02-01 DIAGNOSIS — M25512 Pain in left shoulder: Secondary | ICD-10-CM

## 2023-02-01 DIAGNOSIS — G8929 Other chronic pain: Secondary | ICD-10-CM

## 2023-02-01 DIAGNOSIS — R600 Localized edema: Secondary | ICD-10-CM | POA: Diagnosis not present

## 2023-02-01 DIAGNOSIS — M6281 Muscle weakness (generalized): Secondary | ICD-10-CM | POA: Diagnosis not present

## 2023-02-01 DIAGNOSIS — R293 Abnormal posture: Secondary | ICD-10-CM | POA: Diagnosis not present

## 2023-02-01 MED ORDER — TRAMADOL HCL 50 MG PO TABS
50.0000 mg | ORAL_TABLET | Freq: Four times a day (QID) | ORAL | 0 refills | Status: AC | PRN
  Filled 2023-02-01: qty 30, 8d supply, fill #0

## 2023-02-01 NOTE — Therapy (Signed)
OUTPATIENT PHYSICAL THERAPY TREATMENT   Patient Name: MASSIMO SHIPES MRN: [id number] DOB:08-17-1950, 72 y.o., male Today's Date: 02/01/2023  END OF SESSION:  PT End of Session - 02/01/23 0806     Visit Number 3    Number of Visits 20    Date for PT Re-Evaluation 03/31/23    Authorization Type [employer]    PT Start Time 0805    PT Stop Time 0846    PT Time Calculation (min) 41 min    Activity Tolerance Patient tolerated treatment well    Behavior During Therapy [facility] for tasks assessed/performed               Past Medical History:  Diagnosis Date   Arthritis    back , R foot -   Deep vein thrombosis (HCC)    after knee surgery in 2004   Diabetes mellitus without complication (HCC)    Patients not aware of being dx   Hypertension    Murmur, cardiac    pt. reports its difficult to auscultate    Sleep apnea    CPAP- last study- 2014 cpap set on 13 per pt   Wears glasses 11/20/2020   for reading   Past Surgical History:  Procedure Laterality Date   ANTERIOR CERVICAL DECOMP/DISCECTOMY FUSION N/A 04/14/2020   Procedure: ANTERIOR CERVICAL DECOMPRESSION/DISCECTOMY FUSION, INTERBODY PROSTHESIS, PLATE/SCREWS CERVICAL THREE-FOUR,CERVICAL FOUR-FIVE,CERVICAL SIX-SEVEN;  Surgeon: Tressie Stalker, MD;  Location: [facility] OR;  Service: Neurosurgery;  Laterality: N/A;  anterior   BICEPT TENODESIS Left 01/04/2023   Procedure: LEFT BICEPS TENODESIS;  Surgeon: Cammy Copa, MD;  Location: [facility] ORS;  Service: Orthopedics;  Laterality: Left;   CERVICAL SPINE SURGERY N/A 1990   C5-6 discectomy plates and screws in neck per pt   colonscopy  2017   polyps removed per pt follow up in 5 years   FINGER SURGERY Right 2008   x 3 index finger   KNEE ARTHROSCOPY Bilateral    L 2004, R 2005   MASS EXCISION Right 01/18/2017   Procedure: EXCISION BENIGN RIGHT NECK MASS;  Surgeon: Drema Halon, MD;  Location: [facility];  Service: ENT;  Laterality: Right;    MASS EXCISION Bilateral 03/23/2019   Procedure: EXCISIONS OF RIGHT CHEEK LESION, RIGHT POSTERIOR NECK LIPOMA  AND LEFT NECK NODULE;  Surgeon: Drema Halon, MD;  Location: [facility];  Service: ENT;  Laterality: Bilateral;   NASAL FRACTURE SURGERY  1980   REVERSE SHOULDER ARTHROPLASTY Left 01/04/2023   Procedure: LEFT REVERSE SHOULDER ARTHROPLASTY;  Surgeon: Cammy Copa, MD;  Location: [facility];  Service: Orthopedics;  Laterality: Left;   TOTAL HIP ARTHROPLASTY Left 12/18/2021   Procedure: LEFT TOTAL HIP ARTHROPLASTY ANTERIOR APPROACH;  Surgeon: Kathryne Hitch, MD;  Location: [facility];  Service: Orthopedics;  Laterality: Left;   TOTAL KNEE ARTHROPLASTY Right 06/01/2016   Procedure: TOTAL KNEE ARTHROPLASTY;  Surgeon: Valeria Batman, MD;  Location: [facility] OR;  Service: Orthopedics;  Laterality: Right;   TOTAL KNEE ARTHROPLASTY Left 07/25/2018   Procedure: LEFT TOTAL KNEE ARTHROPLASTY;  Surgeon: Valeria Batman, MD;  Location: [facility] OR;  Service: Orthopedics;  Laterality: Left;   ULNAR NERVE TRANSPOSITION Left 11/25/2020   Procedure: ulnar nerve decompression left elbow;  Surgeon: Valeria Batman, MD;  Location: [facility] OR;  Service: Orthopedics;  Laterality: Left;   Patient Active Problem List   Diagnosis Date Noted   Left rotator cuff tear arthropathy 01/08/2023   Biceps tendonitis on left  01/08/2023   S/P reverse total shoulder arthroplasty, left 01/04/2023   Status post total replacement of left hip 12/18/2021   Pain in left hip 09/03/2021   Neuropathy, 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   Cervical spondylosis with myelopathy and radiculopathy 04/14/2020   Impingement syndrome of right shoulder 10/10/2019   Low back pain 10/10/2019   Irritation of ulnar nerve, left 08/28/2019   Class 2 obesity due to excess calories without serious comorbidity with body mass index (BMI) of 39.0 to 39.9 in adult 01/08/2019   Pain in right elbow 12/06/2018   Primary osteoarthritis of left  knee 07/25/2018   History of left knee replacement 07/25/2018   Right foot pain 06/22/2018   Acute left-sided low back pain with left-sided sciatica 06/22/2018   Osteoarthritis 11/21/2017   Hyperlipidemia 11/21/2017   Low testosterone 09/26/2016   Healthcare maintenance 09/26/2016   S/P total knee replacement using cement, right 06/01/2016   Sleep apnea 05/19/2016   Hypertension 05/19/2016    PCP: Lavada Mesi MD  REFERRING PROVIDER: Cammy Copa, MD  REFERRING DIAG: [phone] (ICD-10-CM) - S/P reverse total shoulder arthroplasty, left  THERAPY DIAG:  Chronic left shoulder pain  Muscle weakness (generalized)  Abnormal posture  Localized edema  Rationale for Evaluation and Treatment: Rehabilitation  ONSET DATE: surgery 01/04/2023  SUBJECTIVE:                                                                                                                                                                                      SUBJECTIVE STATEMENT: Cold front coming in is making his shoulder feel more stiff   PERTINENT HISTORY: Previous C5-6 discectomy, presenting for C3-4, C4-5, C6-7 ACDF due to pain. PMH includes: arthritis, HTN, PNA, sleep apnea.   PAIN:  NPRS scale: at worst 7-8 /10, 4-5/10 upon arrival Pain location: Lt shoulder  Pain description: ache, burning  Aggravating factors: after CPM use Relieving factors: ice  PRECAUTIONS: Avoid active IR strengthening, ER past 30 deg 4-6 weeks  WEIGHT BEARING RESTRICTIONS: No  FALLS:  Has patient fallen in last 6 months? 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  PLOF: Independent, Rt handed.  Reading, yard work (limited). Walking.   PATIENT GOALS:Reduce pain, get range better.   OBJECTIVE:   PATIENT SURVEYS:  01/20/2023 FOTO intake:   50  predicted:  65  COGNITION: 01/20/2023 Overall cognitive  status: WFL     SENSATION: 01/20/2023 WFL  POSTURE: 01/20/2023 Rounded shoulders   Localized edema Lt shoulder post surgery.   UPPER EXTREMITY ROM:   ROM Right 01/20/2023 Left 01/20/2023  Left 01/27/2023  Shoulder flexion WFL 90 AROM in supine  120 PROM in supine 140 deg PROM in supine  Shoulder extension     Shoulder abduction WFL 90 PROM in supine    Shoulder adduction     Shoulder internal rotation     Shoulder external rotation  PROM 10 deg  in 30 deg abduction    Elbow flexion     Elbow extension     Wrist flexion     Wrist extension     Wrist ulnar deviation     Wrist radial deviation     Wrist pronation     Wrist supination     (Blank rows = not tested)  UPPER EXTREMITY MMT:  MMT Right 01/20/2023 Left 01/20/2023  Held shoulder based MMT due to surgery protocol  Shoulder flexion 5/5   Shoulder extension    Shoulder abduction 5/5   Shoulder adduction    Shoulder internal rotation 5/5   Shoulder external rotation 5/5   Middle trapezius    Lower trapezius    Elbow flexion 5/5 5/5  Elbow extension 5/5 5/5  Wrist flexion    Wrist extension    Wrist ulnar deviation    Wrist radial deviation    Wrist pronation    Wrist supination    Grip strength (lbs)    (Blank rows = not tested)  SHOULDER SPECIAL TESTS: 01/20/2023 (+) Shrug noted in active Lt shoulder flexion attempt vs. Gravity.   JOINT MOBILITY TESTING:  01/20/2023 Guarding noted but not specific joint mobility noted.   PALPATION:  01/20/2023 General tenderness around Lt shoulder.                                                                                                                                                                                                  TODAY'S TREATMENT:                                                                                                       DATE: 02/01/2023 Therex: Pulleys flexion and scaption x 3 min each Wall ladder flexion and scaption x 10 reps each  with 1# wrist weight Supine AAROM  flexion with 1 lb bar x 20 Supine Lt shoulder flexion with 1/4# 2x10 Shoulder circles in supine and 90 deg flexion and 1/4#; x 20 each direction Seated AA shoulder flexion 1# bar 2x10 Seated AA shoulder abduction 1# bar 2x10    TODAY'S TREATMENT:                                                                                                       DATE: 01/27/2023 Therex: Supine AAROM flexion with 1 lb bar x 15 Supine AROM flexion Lt x 10 Supine Lt shoulder protraction 3 sec hold x 10 in 90 deg flexion  Seated AROM 1 lb bar flexion x 10 (cues for use at work in sitting positioning) Seated pulleys flexion, scaption 3 mins each way with 2-3 sec hold at top.  Eccentric lowering focus with outstretched arm.  Tband rows Green band 2 x 15 bilateral c scap retraction  Tband gh ext green band 2 x 15 bilateral with 2 sec hold   Manual  Lt arm PROM, g2 inferior/superior glides Lt GH joint.    TODAY'S TREATMENT:                                                                                                       DATE: 01/20/2023 Therex:    HEP instruction/performance c cues for techniques, handout provided.  Trial set performed of each for comprehension and symptom assessment.  See below for exercise list  Manual  Lt arm PROM, g2 inferior/superior glides Lt GH joint.   PATIENT EDUCATION: 01/20/2023 Education details: HEP, POC Person educated: Patient Education method: Programmer, multimedia, Demonstration, Verbal cues, and Handouts Education comprehension: verbalized understanding, returned demonstration, and verbal cues required  HOME EXERCISE PROGRAM: Access Code: [email or web address] URL: [email or web address] Date: 01/20/2023 Prepared by: Chyrel Masson  Exercises - Supine Shoulder Flexion Extension AAROM with Dowel  - 1-2 x daily - 7 x weekly - 1-2 sets - 10-15 reps - 3 hold - Supine Shoulder Protraction with Dowel  - 1-2 x daily - 7 x weekly - 1-2 sets -  10-15 reps - 3-5 hold - Seated Shoulder Flexion Self PROM  - 1-2 x daily - 7 x weekly - 1-2 sets - 10 reps - 3 hold - Seated Scapular Retraction  - 3-5 x daily - 7 x weekly - 1 sets - 10 reps - 3-5 hold  ASSESSMENT:  CLINICAL IMPRESSION: Pt tolerated session well today with continued focus on maximizing ROM and light strengthening as tolerated.  Will continue to benefit from PT to maximize function.  OBJECTIVE IMPAIRMENTS: decreased activity tolerance, decreased coordination, decreased endurance, decreased mobility, decreased ROM, decreased strength, increased edema,  increased fascial restrictions, impaired perceived functional ability, impaired flexibility, impaired UE functional use, postural dysfunction, and pain.   ACTIVITY LIMITATIONS: carrying, lifting, sleeping, bed mobility, bathing, dressing, self feeding, reach over head, and hygiene/grooming  PARTICIPATION LIMITATIONS: meal prep, cleaning, laundry, interpersonal relationship, driving, community activity, occupation, and yard work  PERSONAL FACTORS:  No specific limitations  are also affecting patient's functional outcome.   REHAB POTENTIAL: Good  CLINICAL DECISION MAKING: Stable/uncomplicated  EVALUATION COMPLEXITY: Low   GOALS: Goals reviewed with patient? Yes  SHORT TERM GOALS: (target date for Short term goals are 3 weeks 02/10/2023)  1.Patient will demonstrate independent use of home exercise program to maintain progress from in clinic treatments. Goal status: on going 01/27/2023  LONG TERM GOALS: (target dates for all long term goals are 10 weeks  03/31/2023 )   1. Patient will demonstrate/report pain at worst less than or equal to 2/10 to facilitate minimal limitation in daily activity secondary to pain symptoms. Goal status: New   2. Patient will demonstrate independent use of home exercise program to facilitate ability to maintain/progress functional gains from skilled physical therapy services. Goal status:  New   3. Patient will demonstrate FOTO outcome > or = 65 % to indicate reduced disability due to condition. Goal status: New   4.  Patient will demonstrate Lt UE MMT 4/5 or greater  throughout to facilitate lifting, reaching, carrying at [facility] in daily activity.  Goal status: New   5.  Patient will demonstrate Lt  [facility] joint AROM WFL s symptoms to facilitate usual overhead reaching, self care, dressing at PLOF.   Goal status: New   6.  Patient will demonstrate/report ability to perform routine yard work at [employer] s limitation.  Goal status: New   PLAN:  PT FREQUENCY: 1-2x/week (start at 2)  PT DURATION: 10 weeks  PLANNED INTERVENTIONS: Therapeutic exercises, Therapeutic activity, Neuro Muscular re-education, Balance training, Gait training, Patient/Family education, Joint mobilization, Stair training, DME instructions, Dry Needling, Electrical stimulation, Traction, Cryotherapy, vasopneumatic device Moist heat, Taping, Ultrasound, Ionotophoresis 4mg /ml Dexamethasone, and aquatic therapy ,Manual therapy.  All included unless contraindicated  PLAN FOR NEXT SESSION:  tband rows/ gh ext to HEP if needed,   Continued AAROM/AROM transitioning as able.   Avoid active IR strengthening, ER past 30 deg 4-6 weeks   Clarita Crane, PT, DPT 02/01/23 8:46 AM

## 2023-02-03 ENCOUNTER — Ambulatory Visit: Payer: PPO | Admitting: Physical Therapy

## 2023-02-03 ENCOUNTER — Encounter: Payer: Self-pay | Admitting: Physical Therapy

## 2023-02-03 DIAGNOSIS — G8929 Other chronic pain: Secondary | ICD-10-CM

## 2023-02-03 DIAGNOSIS — R293 Abnormal posture: Secondary | ICD-10-CM

## 2023-02-03 DIAGNOSIS — R600 Localized edema: Secondary | ICD-10-CM

## 2023-02-03 DIAGNOSIS — M6281 Muscle weakness (generalized): Secondary | ICD-10-CM | POA: Diagnosis not present

## 2023-02-03 DIAGNOSIS — M25512 Pain in left shoulder: Secondary | ICD-10-CM

## 2023-02-03 NOTE — Therapy (Signed)
OUTPATIENT PHYSICAL THERAPY TREATMENT   Patient Name: James Gallegos MRN: [id number] DOB:17-Dec-1950, 72 y.o., male Today's Date: 02/03/2023  END OF SESSION:  PT End of Session - 02/03/23 0756     Visit Number 4    Number of Visits 20    Date for PT Re-Evaluation 03/31/23    Authorization Type [employer]    PT Start Time 0800    PT Stop Time 0830   pt needed to leave early   PT Time Calculation (min) 30 min    Activity Tolerance Patient tolerated treatment well    Behavior During Therapy [facility] for tasks assessed/performed                Past Medical History:  Diagnosis Date   Arthritis    back , R foot -   Deep vein thrombosis (HCC)    after knee surgery in 2004   Diabetes mellitus without complication (HCC)    Patients not aware of being dx   Hypertension    Murmur, cardiac    pt. reports its difficult to auscultate    Sleep apnea    CPAP- last study- 2014 cpap set on 13 per pt   Wears glasses 11/20/2020   for reading   Past Surgical History:  Procedure Laterality Date   ANTERIOR CERVICAL DECOMP/DISCECTOMY FUSION N/A 04/14/2020   Procedure: ANTERIOR CERVICAL DECOMPRESSION/DISCECTOMY FUSION, INTERBODY PROSTHESIS, PLATE/SCREWS CERVICAL THREE-FOUR,CERVICAL FOUR-FIVE,CERVICAL SIX-SEVEN;  Surgeon: Tressie Stalker, MD;  Location: [facility] OR;  Service: Neurosurgery;  Laterality: N/A;  anterior   BICEPT TENODESIS Left 01/04/2023   Procedure: LEFT BICEPS TENODESIS;  Surgeon: Cammy Copa, MD;  Location: [facility] ORS;  Service: Orthopedics;  Laterality: Left;   CERVICAL SPINE SURGERY N/A 1990   C5-6 discectomy plates and screws in neck per pt   colonscopy  2017   polyps removed per pt follow up in 5 years   FINGER SURGERY Right 2008   x 3 index finger   KNEE ARTHROSCOPY Bilateral    L 2004, R 2005   MASS EXCISION Right 01/18/2017   Procedure: EXCISION BENIGN RIGHT NECK MASS;  Surgeon: Drema Halon, MD;  Location: [facility];  Service:  ENT;  Laterality: Right;   MASS EXCISION Bilateral 03/23/2019   Procedure: EXCISIONS OF RIGHT CHEEK LESION, RIGHT POSTERIOR NECK LIPOMA  AND LEFT NECK NODULE;  Surgeon: Drema Halon, MD;  Location: [facility];  Service: ENT;  Laterality: Bilateral;   NASAL FRACTURE SURGERY  1980   REVERSE SHOULDER ARTHROPLASTY Left 01/04/2023   Procedure: LEFT REVERSE SHOULDER ARTHROPLASTY;  Surgeon: Cammy Copa, MD;  Location: [facility];  Service: Orthopedics;  Laterality: Left;   TOTAL HIP ARTHROPLASTY Left 12/18/2021   Procedure: LEFT TOTAL HIP ARTHROPLASTY ANTERIOR APPROACH;  Surgeon: Kathryne Hitch, MD;  Location: [facility];  Service: Orthopedics;  Laterality: Left;   TOTAL KNEE ARTHROPLASTY Right 06/01/2016   Procedure: TOTAL KNEE ARTHROPLASTY;  Surgeon: Valeria Batman, MD;  Location: [facility] OR;  Service: Orthopedics;  Laterality: Right;   TOTAL KNEE ARTHROPLASTY Left 07/25/2018   Procedure: LEFT TOTAL KNEE ARTHROPLASTY;  Surgeon: Valeria Batman, MD;  Location: [facility] OR;  Service: Orthopedics;  Laterality: Left;   ULNAR NERVE TRANSPOSITION Left 11/25/2020   Procedure: ulnar nerve decompression left elbow;  Surgeon: Valeria Batman, MD;  Location: [facility] OR;  Service: Orthopedics;  Laterality: Left;   Patient Active Problem List   Diagnosis Date Noted   Left rotator cuff tear arthropathy  01/08/2023   Biceps tendonitis on left 01/08/2023   S/P reverse total shoulder arthroplasty, left 01/04/2023   Status post total replacement of left hip 12/18/2021   Pain in left hip 09/03/2021   Neuropathy, 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   Cervical spondylosis with myelopathy and radiculopathy 04/14/2020   Impingement syndrome of right shoulder 10/10/2019   Low back pain 10/10/2019   Irritation of ulnar nerve, left 08/28/2019   Class 2 obesity due to excess calories without serious comorbidity with body mass index (BMI) of 39.0 to 39.9 in adult 01/08/2019   Pain in right elbow 12/06/2018    Primary osteoarthritis of left knee 07/25/2018   History of left knee replacement 07/25/2018   Right foot pain 06/22/2018   Acute left-sided low back pain with left-sided sciatica 06/22/2018   Osteoarthritis 11/21/2017   Hyperlipidemia 11/21/2017   Low testosterone 09/26/2016   Healthcare maintenance 09/26/2016   S/P total knee replacement using cement, right 06/01/2016   Sleep apnea 05/19/2016   Hypertension 05/19/2016    PCP: Lavada Mesi MD  REFERRING PROVIDER: Cammy Copa, MD  REFERRING DIAG: [phone] (ICD-10-CM) - S/P reverse total shoulder arthroplasty, left  THERAPY DIAG:  Chronic left shoulder pain  Muscle weakness (generalized)  Abnormal posture  Localized edema  Rationale for Evaluation and Treatment: Rehabilitation  ONSET DATE: surgery 01/04/2023  SUBJECTIVE:                                                                                                                                                                                      SUBJECTIVE STATEMENT: Shoulder feels pretty good today   PERTINENT HISTORY: Previous C5-6 discectomy, presenting for C3-4, C4-5, C6-7 ACDF due to pain. PMH includes: arthritis, HTN, PNA, sleep apnea.   PAIN:  NPRS scale: at worst 7-8 /10, 1-2/10 upon arrival Pain location: Lt shoulder  Pain description: ache, burning  Aggravating factors: after CPM use Relieving factors: ice  PRECAUTIONS: Avoid active IR strengthening, ER past 30 deg 4-6 weeks  WEIGHT BEARING RESTRICTIONS: No  FALLS:  Has patient fallen in last 6 months? 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  PLOF: Independent, Rt handed.  Reading, yard work (limited). Walking.   PATIENT GOALS:Reduce pain, get range better.   OBJECTIVE:   PATIENT SURVEYS:  01/20/2023 FOTO intake:   50  predicted:  65  COGNITION: 01/20/2023 Overall cognitive  status: WFL     SENSATION: 01/20/2023 WFL  POSTURE: 01/20/2023 Rounded shoulders   Localized edema Lt shoulder post surgery.   UPPER EXTREMITY ROM:   ROM Right 01/20/2023 Left  01/20/2023 Left 01/27/2023  Shoulder flexion WFL 90 AROM in supine  120 PROM in supine 140 deg PROM in supine  Shoulder extension     Shoulder abduction WFL 90 PROM in supine    Shoulder adduction     Shoulder internal rotation     Shoulder external rotation  PROM 10 deg  in 30 deg abduction    Elbow flexion     Elbow extension     Wrist flexion     Wrist extension     Wrist ulnar deviation     Wrist radial deviation     Wrist pronation     Wrist supination     (Blank rows = not tested)  UPPER EXTREMITY MMT:  MMT Right 01/20/2023 Left 01/20/2023  Held shoulder based MMT due to surgery protocol  Shoulder flexion 5/5   Shoulder extension    Shoulder abduction 5/5   Shoulder adduction    Shoulder internal rotation 5/5   Shoulder external rotation 5/5   Middle trapezius    Lower trapezius    Elbow flexion 5/5 5/5  Elbow extension 5/5 5/5  Wrist flexion    Wrist extension    Wrist ulnar deviation    Wrist radial deviation    Wrist pronation    Wrist supination    Grip strength (lbs)    (Blank rows = not tested)  SHOULDER SPECIAL TESTS: 01/20/2023 (+) Shrug noted in active Lt shoulder flexion attempt vs. Gravity.   JOINT MOBILITY TESTING:  01/20/2023 Guarding noted but not specific joint mobility noted.   PALPATION:  01/20/2023 General tenderness around Lt shoulder.                                                                                                                                                                                                  TODAY'S TREATMENT:                                                                                                       DATE: 02/03/2023 Therex: Pulleys flexion and scaption x 2 min each Wall ladder flexion and scaption x 10 reps each  with 1# wrist weight Seated  AA shoulder flexion 2# bar 2x10 Seated AA shoulder abduction 1# bar x10 Rows L4 band 2x10; 5 sec hold High row L4 band 2x10  TODAY'S TREATMENT:                                                                                                       DATE: 02/01/2023 Therex: Pulleys flexion and scaption x 3 min each Wall ladder flexion and scaption x 10 reps each with 1# wrist weight Supine AAROM flexion with 1 lb bar x 20 Supine Lt shoulder flexion with 1/4# 2x10 Shoulder circles in supine and 90 deg flexion and 1/4#; x 20 each direction Seated AA shoulder flexion 1# bar 2x10 Seated AA shoulder abduction 1# bar 2x10    TODAY'S TREATMENT:                                                                                                       DATE: 01/27/2023 Therex: Supine AAROM flexion with 1 lb bar x 15 Supine AROM flexion Lt x 10 Supine Lt shoulder protraction 3 sec hold x 10 in 90 deg flexion  Seated AROM 1 lb bar flexion x 10 (cues for use at work in sitting positioning) Seated pulleys flexion, scaption 3 mins each way with 2-3 sec hold at top.  Eccentric lowering focus with outstretched arm.  Tband rows Green band 2 x 15 bilateral c scap retraction  Tband gh ext green band 2 x 15 bilateral with 2 sec hold   Manual  Lt arm PROM, g2 inferior/superior glides Lt GH joint.    TODAY'S TREATMENT:                                                                                                       DATE: 01/20/2023 Therex:    HEP instruction/performance c cues for techniques, handout provided.  Trial set performed of each for comprehension and symptom assessment.  See below for exercise list  Manual  Lt arm PROM, g2 inferior/superior glides Lt GH joint.   PATIENT EDUCATION: 01/20/2023 Education details: HEP, POC Person educated: Patient Education method: Programmer, multimedia, Demonstration, Verbal cues, and Handouts Education comprehension: verbalized understanding,  returned demonstration, and verbal cues required  HOME EXERCISE PROGRAM: Access Code:  [email or web address] URL: [email or web address] Date: 02/03/2023 Prepared by: Moshe Cipro  Exercises - Supine Shoulder Flexion Extension AAROM with Dowel  - 1-2 x daily - 7 x weekly - 1-2 sets - 10-15 reps - 3 hold - Supine Shoulder Protraction with Dowel  - 1-2 x daily - 7 x weekly - 1-2 sets - 10-15 reps - 3-5 hold - Seated Shoulder Flexion Self PROM  - 1-2 x daily - 7 x weekly - 1-2 sets - 10 reps - 3 hold - Seated Scapular Retraction  - 3-5 x daily - 7 x weekly - 1 sets - 10 reps - 3-5 hold - Standing Shoulder Row with Anchored Resistance  - 1-2 x daily - 7 x weekly - 2 sets - 10 reps - 5 sec hold - Shoulder extension with resistance - Neutral  - 1-2 x daily - 7 x weekly - 2 sets - 10 reps - 5 sec hold  ASSESSMENT:  CLINICAL IMPRESSION: Added rows to HEP today and continued work on maximizing ROM and light strengthening within restrictions.  Progressing well with PT, still with strength and ROM deficits affecting function.  OBJECTIVE IMPAIRMENTS: decreased activity tolerance, decreased coordination, decreased endurance, decreased mobility, decreased ROM, decreased strength, increased edema, increased fascial restrictions, impaired perceived functional ability, impaired flexibility, impaired UE functional use, postural dysfunction, and pain.   ACTIVITY LIMITATIONS: carrying, lifting, sleeping, bed mobility, bathing, dressing, self feeding, reach over head, and hygiene/grooming  PARTICIPATION LIMITATIONS: meal prep, cleaning, laundry, interpersonal relationship, driving, community activity, occupation, and yard work  PERSONAL FACTORS:  No specific limitations  are also affecting patient's functional outcome.   REHAB POTENTIAL: Good  CLINICAL DECISION MAKING: Stable/uncomplicated  EVALUATION COMPLEXITY: Low   GOALS: Goals reviewed with patient? Yes  SHORT TERM GOALS: (target date for  Short term goals are 3 weeks 02/10/2023)  1.Patient will demonstrate independent use of home exercise program to maintain progress from in clinic treatments. Goal status: on going 01/27/2023  LONG TERM GOALS: (target dates for all long term goals are 10 weeks  03/31/2023 )   1. Patient will demonstrate/report pain at worst less than or equal to 2/10 to facilitate minimal limitation in daily activity secondary to pain symptoms. Goal status: New   2. Patient will demonstrate independent use of home exercise program to facilitate ability to maintain/progress functional gains from skilled physical therapy services. Goal status: New   3. Patient will demonstrate FOTO outcome > or = 65 % to indicate reduced disability due to condition. Goal status: New   4.  Patient will demonstrate Lt UE MMT 4/5 or greater  throughout to facilitate lifting, reaching, carrying at [facility] in daily activity.  Goal status: New   5.  Patient will demonstrate Lt  [facility] joint AROM WFL s symptoms to facilitate usual overhead reaching, self care, dressing at PLOF.   Goal status: New   6.  Patient will demonstrate/report ability to perform routine yard work at [employer] s limitation.  Goal status: New   PLAN:  PT FREQUENCY: 1-2x/week (start at 2)  PT DURATION: 10 weeks  PLANNED INTERVENTIONS: Therapeutic exercises, Therapeutic activity, Neuro Muscular re-education, Balance training, Gait training, Patient/Family education, Joint mobilization, Stair training, DME instructions, Dry Needling, Electrical stimulation, Traction, Cryotherapy, vasopneumatic device Moist heat, Taping, Ultrasound, Ionotophoresis 4mg /ml Dexamethasone, and aquatic therapy ,Manual therapy.  All included unless contraindicated  PLAN FOR NEXT SESSION:   Continued AAROM/AROM transitioning as able. Light strengthening/muscle activation within restrictions  Avoid active IR strengthening, ER past 30 deg 4-6  weeks  NEXT MD APPT: 02/16/23   Clarita Crane, PT, DPT 02/03/23 8:44 AM

## 2023-02-04 NOTE — Therapy (Signed)
OUTPATIENT PHYSICAL THERAPY TREATMENT   Patient Name: James Gallegos MRN: [id number] DOB:1950-09-18, 72 y.o., male Today's Date: 02/07/2023  END OF SESSION:  PT End of Session - 02/07/23 0810     Visit Number 5    Number of Visits 20    Date for PT Re-Evaluation 03/31/23    Authorization Type [employer]    PT Start Time 0801    PT Stop Time 0840    PT Time Calculation (min) 39 min    Activity Tolerance Patient limited by pain    Behavior During Therapy [facility] for tasks assessed/performed                 Past Medical History:  Diagnosis Date   Arthritis    back , R foot -   Deep vein thrombosis (HCC)    after knee surgery in 2004   Diabetes mellitus without complication (HCC)    Patients not aware of being dx   Hypertension    Murmur, cardiac    pt. reports its difficult to auscultate    Sleep apnea    CPAP- last study- 2014 cpap set on 13 per pt   Wears glasses 11/20/2020   for reading   Past Surgical History:  Procedure Laterality Date   ANTERIOR CERVICAL DECOMP/DISCECTOMY FUSION N/A 04/14/2020   Procedure: ANTERIOR CERVICAL DECOMPRESSION/DISCECTOMY FUSION, INTERBODY PROSTHESIS, PLATE/SCREWS CERVICAL THREE-FOUR,CERVICAL FOUR-FIVE,CERVICAL SIX-SEVEN;  Surgeon: Tressie Stalker, MD;  Location: [facility] OR;  Service: Neurosurgery;  Laterality: N/A;  anterior   BICEPT TENODESIS Left 01/04/2023   Procedure: LEFT BICEPS TENODESIS;  Surgeon: Cammy Copa, MD;  Location: [facility] ORS;  Service: Orthopedics;  Laterality: Left;   CERVICAL SPINE SURGERY N/A 1990   C5-6 discectomy plates and screws in neck per pt   colonscopy  2017   polyps removed per pt follow up in 5 years   FINGER SURGERY Right 2008   x 3 index finger   KNEE ARTHROSCOPY Bilateral    L 2004, R 2005   MASS EXCISION Right 01/18/2017   Procedure: EXCISION BENIGN RIGHT NECK MASS;  Surgeon: Drema Halon, MD;  Location: [facility];  Service: ENT;  Laterality: Right;   MASS  EXCISION Bilateral 03/23/2019   Procedure: EXCISIONS OF RIGHT CHEEK LESION, RIGHT POSTERIOR NECK LIPOMA  AND LEFT NECK NODULE;  Surgeon: Drema Halon, MD;  Location: [facility];  Service: ENT;  Laterality: Bilateral;   NASAL FRACTURE SURGERY  1980   REVERSE SHOULDER ARTHROPLASTY Left 01/04/2023   Procedure: LEFT REVERSE SHOULDER ARTHROPLASTY;  Surgeon: Cammy Copa, MD;  Location: [facility];  Service: Orthopedics;  Laterality: Left;   TOTAL HIP ARTHROPLASTY Left 12/18/2021   Procedure: LEFT TOTAL HIP ARTHROPLASTY ANTERIOR APPROACH;  Surgeon: Kathryne Hitch, MD;  Location: [facility];  Service: Orthopedics;  Laterality: Left;   TOTAL KNEE ARTHROPLASTY Right 06/01/2016   Procedure: TOTAL KNEE ARTHROPLASTY;  Surgeon: Valeria Batman, MD;  Location[facility] OR;  Service: Orthopedics;  Laterality: Right;   TOTAL KNEE ARTHROPLASTY Left 07/25/2018   Procedure: LEFT TOTAL KNEE ARTHROPLASTY;  Surgeon: Valeria Batman, MD;  Location: [facility] OR;  Service: Orthopedics;  Laterality: Left;   ULNAR NERVE TRANSPOSITION Left 11/25/2020   Procedure: ulnar nerve decompression left elbow;  Surgeon: Valeria Batman, MD;  Location: [facility] OR;  Service: Orthopedics;  Laterality: Left;   Patient Active Problem List   Diagnosis Date Noted   Left rotator cuff tear arthropathy 01/08/2023   Biceps tendonitis  on left 01/08/2023   S/P reverse total shoulder arthroplasty, left 01/04/2023   Status post total replacement of left hip 12/18/2021   Pain in left hip 09/03/2021   Neuropathy, 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   Cervical spondylosis with myelopathy and radiculopathy 04/14/2020   Impingement syndrome of right shoulder 10/10/2019   Low back pain 10/10/2019   Irritation of ulnar nerve, left 08/28/2019   Class 2 obesity due to excess calories without serious comorbidity with body mass index (BMI) of 39.0 to 39.9 in adult 01/08/2019   Pain in right elbow 12/06/2018   Primary osteoarthritis of left knee  07/25/2018   History of left knee replacement 07/25/2018   Right foot pain 06/22/2018   Acute left-sided low back pain with left-sided sciatica 06/22/2018   Osteoarthritis 11/21/2017   Hyperlipidemia 11/21/2017   Low testosterone 09/26/2016   Healthcare maintenance 09/26/2016   S/P total knee replacement using cement, right 06/01/2016   Sleep apnea 05/19/2016   Hypertension 05/19/2016    PCP: Lavada Mesi MD  REFERRING PROVIDER: Cammy Copa, MD  REFERRING DIAG: [phone] (ICD-10-CM) - S/P reverse total shoulder arthroplasty, left  THERAPY DIAG:  Chronic left shoulder pain  Muscle weakness (generalized)  Abnormal posture  Localized edema  Rationale for Evaluation and Treatment: Rehabilitation  ONSET DATE: surgery 01/04/2023  SUBJECTIVE:                                                                                                                                                                                      SUBJECTIVE STATEMENT: Pt indicated having quick pain that occurred when washing hands the day after last visit.  He reported continued quick pains over the next few days but with reducing frequenting.  Reported today was resolved back down to how it felt prior to that instance.    PERTINENT HISTORY: Previous C5-6 discectomy, presenting for C3-4, C4-5, C6-7 ACDF due to pain. PMH includes: arthritis, HTN, PNA, sleep apnea.   PAIN:  NPRS scale: at worst 7-8/10 in sharp pain.    No pain at rest upon arrival.  Pain location: Lt shoulder  Pain description: ache, burning  Aggravating factors: after CPM use Relieving factors: ice  PRECAUTIONS: Avoid active IR strengthening, ER past 30 deg 4-6 weeks  WEIGHT BEARING RESTRICTIONS: No  FALLS:  Has patient fallen in last 6 months? 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  PLOF: Independent, Rt handed.   Reading, yard work (limited). Walking.   PATIENT GOALS:Reduce pain, get range better.   OBJECTIVE:   PATIENT SURVEYS:  01/20/2023 FOTO intake:  50  predicted:  65  COGNITION: 01/20/2023 Overall cognitive status: WFL     SENSATION: 01/20/2023 WFL  POSTURE: 01/20/2023 Rounded shoulders   Localized edema Lt shoulder post surgery.   UPPER EXTREMITY ROM:   ROM Right 01/20/2023 Left 01/20/2023 Left 01/27/2023  Shoulder flexion WFL 90 AROM in supine  120 PROM in supine 140 deg PROM in supine  Shoulder extension     Shoulder abduction WFL 90 PROM in supine    Shoulder adduction     Shoulder internal rotation     Shoulder external rotation  PROM 10 deg  in 30 deg abduction    Elbow flexion     Elbow extension     Wrist flexion     Wrist extension     Wrist ulnar deviation     Wrist radial deviation     Wrist pronation     Wrist supination     (Blank rows = not tested)  UPPER EXTREMITY MMT:  MMT Right 01/20/2023 Left 01/20/2023  Held shoulder based MMT due to surgery protocol Left 02/07/2023  Shoulder flexion 5/5    Shoulder extension     Shoulder abduction 5/5    Shoulder adduction     Shoulder internal rotation 5/5    Shoulder external rotation 5/5    Middle trapezius     Lower trapezius     Elbow flexion 5/5 5/5 4/5  Elbow extension 5/5 5/5   Wrist flexion     Wrist extension     Wrist ulnar deviation     Wrist radial deviation     Wrist pronation     Wrist supination     Grip strength (lbs)     (Blank rows = not tested)  SHOULDER SPECIAL TESTS: 01/20/2023 (+) Shrug noted in active Lt shoulder flexion attempt vs. Gravity.   JOINT MOBILITY TESTING:  01/20/2023 Guarding noted but not specific joint mobility noted.   PALPATION:  01/20/2023 General tenderness around Lt shoulder.                                                                                                                                                                                                   TODAY'S TREATMENT:  DATE: 02/07/2023 Therex: Pulleys flexion and scaption x 3 min each Standing green band bilateral rows x 20 ( end range reaching forward created some of similar pain reported from weekend) Standing green band GH extension bilaterally x 20  Supine Lt shoulder AROM 2 x 20 Sideyling Lt shoulder AROM abduction x 20  Seated AAROM 1 lb bar flexion x 10    TODAY'S TREATMENT:                                                                                                       DATE: 02/03/2023 Therex: Pulleys flexion and scaption x 2 min each Wall ladder flexion and scaption x 10 reps each with 1# wrist weight Seated AA shoulder flexion 2# bar 2x10 Seated AA shoulder abduction 1# bar x10 Rows L4 band 2x10; 5 sec hold High row L4 band 2x10  TODAY'S TREATMENT:                                                                                                       DATE: 02/01/2023 Therex: Pulleys flexion and scaption x 3 min each Wall ladder flexion and scaption x 10 reps each with 1# wrist weight Supine AAROM flexion with 1 lb bar x 20 Supine Lt shoulder flexion with 1/4# 2x10 Shoulder circles in supine and 90 deg flexion and 1/4#; x 20 each direction Seated AA shoulder flexion 1# bar 2x10 Seated AA shoulder abduction 1# bar 2x10   TODAY'S TREATMENT:                                                                                                       DATE: 01/27/2023 Therex: Supine AAROM flexion with 1 lb bar x 15 Supine AROM flexion Lt x 10 Supine Lt shoulder protraction 3 sec hold x 10 in 90 deg flexion  Seated AROM 1 lb bar flexion x 10 (cues for use at work in sitting positioning) Seated pulleys flexion, scaption 3 mins each way with 2-3 sec hold at top.  Eccentric lowering focus with outstretched arm.  Tband rows Green band 2 x 15 bilateral c scap retraction  Tband  gh ext green band 2 x 15 bilateral with 2 sec hold   Manual  Lt  arm PROM, g2 inferior/superior glides Lt GH joint.    PATIENT EDUCATION: 01/20/2023 Education details: HEP, POC Person educated: Patient Education method: Programmer, multimedia, Demonstration, Verbal cues, and Handouts Education comprehension: verbalized understanding, returned demonstration, and verbal cues required  HOME EXERCISE PROGRAM: Access Code: [email or web address] URL: [email or web address] Date: 02/03/2023 Prepared by: Moshe Cipro  Exercises - Supine Shoulder Flexion Extension AAROM with Dowel  - 1-2 x daily - 7 x weekly - 1-2 sets - 10-15 reps - 3 hold - Supine Shoulder Protraction with Dowel  - 1-2 x daily - 7 x weekly - 1-2 sets - 10-15 reps - 3-5 hold - Seated Shoulder Flexion Self PROM  - 1-2 x daily - 7 x weekly - 1-2 sets - 10 reps - 3 hold - Seated Scapular Retraction  - 3-5 x daily - 7 x weekly - 1 sets - 10 reps - 3-5 hold - Standing Shoulder Row with Anchored Resistance  - 1-2 x daily - 7 x weekly - 2 sets - 10 reps - 5 sec hold - Shoulder extension with resistance - Neutral  - 1-2 x daily - 7 x weekly - 2 sets - 10 reps - 5 sec hold  ASSESSMENT:  CLINICAL IMPRESSION: Check of submax muscle activation indicated some increased weakness in biceps testing for elbow flexion on Rt.  Mild symptoms noted in that.  Inspection showed possibility of very mild possibility of popeye sign but inconclusive upon inspection today.   Towards end of session while performing AAROM with wand, pain was noted in flexion.  Tenderness to touch in proximal biceps area on Lt with pain produced in elbow flexion and supination attempts with weakness.   Due to symptoms reported, reduced resistance in active movement and reviewed gravity reduced positioning to facilitate movement without compensatory actions and symptom increases.    OBJECTIVE IMPAIRMENTS: decreased activity tolerance, decreased coordination, decreased endurance,  decreased mobility, decreased ROM, decreased strength, increased edema, increased fascial restrictions, impaired perceived functional ability, impaired flexibility, impaired UE functional use, postural dysfunction, and pain.   ACTIVITY LIMITATIONS: carrying, lifting, sleeping, bed mobility, bathing, dressing, self feeding, reach over head, and hygiene/grooming  PARTICIPATION LIMITATIONS: meal prep, cleaning, laundry, interpersonal relationship, driving, community activity, occupation, and yard work  PERSONAL FACTORS:  No specific limitations  are also affecting patient's functional outcome.   REHAB POTENTIAL: Good  CLINICAL DECISION MAKING: Stable/uncomplicated  EVALUATION COMPLEXITY: Low   GOALS: Goals reviewed with patient? Yes  SHORT TERM GOALS: (target date for Short term goals are 3 weeks 02/10/2023)  1.Patient will demonstrate independent use of home exercise program to maintain progress from in clinic treatments. Goal status: Met   LONG TERM GOALS: (target dates for all long term goals are 10 weeks  03/31/2023 )   1. Patient will demonstrate/report pain at worst less than or equal to 2/10 to facilitate minimal limitation in daily activity secondary to pain symptoms. Goal status: New   2. Patient will demonstrate independent use of home exercise program to facilitate ability to maintain/progress functional gains from skilled physical therapy services. Goal status: New   3. Patient will demonstrate FOTO outcome > or = 65 % to indicate reduced disability due to condition. Goal status: New   4.  Patient will demonstrate Lt UE MMT 4/5 or greater  throughout to facilitate lifting, reaching, carrying at [facility] in daily activity.  Goal status: New   5.  Patient will demonstrate Lt  [facility] joint AROM WFL s symptoms to facilitate usual overhead reaching, self care,  dressing at PLOF.   Goal status: New   6.  Patient will demonstrate/report ability to perform routine yard work at [employer] s  limitation.  Goal status: New   PLAN:  PT FREQUENCY: 1-2x/week (start at 2)  PT DURATION: 10 weeks  PLANNED INTERVENTIONS: Therapeutic exercises, Therapeutic activity, Neuro Muscular re-education, Balance training, Gait training, Patient/Family education, Joint mobilization, Stair training, DME instructions, Dry Needling, Electrical stimulation, Traction, Cryotherapy, vasopneumatic device Moist heat, Taping, Ultrasound, Ionotophoresis 4mg /ml Dexamethasone, and aquatic therapy ,Manual therapy.  All included unless contraindicated  PLAN FOR NEXT SESSION:   Follow up on symptoms reported today, check biceps area.  Mixture of gravity reduced and early AAROM in gravity positioning.   Avoid active IR strengthening, ER past 30 deg 4-6 weeks  NEXT MD APPT: 02/16/23   Chyrel Masson, PT, DPT, OCS, ATC 02/07/23  8:38 AM

## 2023-02-07 ENCOUNTER — Ambulatory Visit: Payer: PPO | Admitting: Rehabilitative and Restorative Service Providers"

## 2023-02-07 ENCOUNTER — Encounter: Payer: Self-pay | Admitting: Rehabilitative and Restorative Service Providers"

## 2023-02-07 DIAGNOSIS — R293 Abnormal posture: Secondary | ICD-10-CM

## 2023-02-07 DIAGNOSIS — M6281 Muscle weakness (generalized): Secondary | ICD-10-CM

## 2023-02-07 DIAGNOSIS — R600 Localized edema: Secondary | ICD-10-CM | POA: Diagnosis not present

## 2023-02-07 DIAGNOSIS — G8929 Other chronic pain: Secondary | ICD-10-CM

## 2023-02-07 DIAGNOSIS — M25512 Pain in left shoulder: Secondary | ICD-10-CM | POA: Diagnosis not present

## 2023-02-10 ENCOUNTER — Encounter: Payer: Self-pay | Admitting: Rehabilitative and Restorative Service Providers"

## 2023-02-10 ENCOUNTER — Ambulatory Visit: Payer: PPO | Admitting: Rehabilitative and Restorative Service Providers"

## 2023-02-10 DIAGNOSIS — M25512 Pain in left shoulder: Secondary | ICD-10-CM | POA: Diagnosis not present

## 2023-02-10 DIAGNOSIS — R293 Abnormal posture: Secondary | ICD-10-CM | POA: Diagnosis not present

## 2023-02-10 DIAGNOSIS — M6281 Muscle weakness (generalized): Secondary | ICD-10-CM | POA: Diagnosis not present

## 2023-02-10 DIAGNOSIS — G8929 Other chronic pain: Secondary | ICD-10-CM

## 2023-02-10 DIAGNOSIS — R600 Localized edema: Secondary | ICD-10-CM

## 2023-02-10 NOTE — Therapy (Signed)
OUTPATIENT PHYSICAL THERAPY TREATMENT   Patient Name: James Gallegos MRN: [id number] DOB:Feb 18, 1951, 72 y.o., male Today's Date: 02/10/2023  END OF SESSION:  PT End of Session - 02/10/23 0832     Visit Number 6    Number of Visits 20    Date for PT Re-Evaluation 03/31/23    Authorization Type [employer]    PT Start Time 0802    PT Stop Time 0840    PT Time Calculation (min) 38 min    Activity Tolerance Patient limited by pain    Behavior During Therapy [facility] for tasks assessed/performed                  Past Medical History:  Diagnosis Date   Arthritis    back , R foot -   Deep vein thrombosis (HCC)    after knee surgery in 2004   Diabetes mellitus without complication (HCC)    Patients not aware of being dx   Hypertension    Murmur, cardiac    pt. reports its difficult to auscultate    Sleep apnea    CPAP- last study- 2014 cpap set on 13 per pt   Wears glasses 11/20/2020   for reading   Past Surgical History:  Procedure Laterality Date   ANTERIOR CERVICAL DECOMP/DISCECTOMY FUSION N/A 04/14/2020   Procedure: ANTERIOR CERVICAL DECOMPRESSION/DISCECTOMY FUSION, INTERBODY PROSTHESIS, PLATE/SCREWS CERVICAL THREE-FOUR,CERVICAL FOUR-FIVE,CERVICAL SIX-SEVEN;  Surgeon: Tressie Stalker, MD;  Location: [facility] OR;  Service: Neurosurgery;  Laterality: N/A;  anterior   BICEPT TENODESIS Left 01/04/2023   Procedure: LEFT BICEPS TENODESIS;  Surgeon: Cammy Copa, MD;  Location: [facility] ORS;  Service: Orthopedics;  Laterality: Left;   CERVICAL SPINE SURGERY N/A 1990   C5-6 discectomy plates and screws in neck per pt   colonscopy  2017   polyps removed per pt follow up in 5 years   FINGER SURGERY Right 2008   x 3 index finger   KNEE ARTHROSCOPY Bilateral    L 2004, R 2005   MASS EXCISION Right 01/18/2017   Procedure: EXCISION BENIGN RIGHT NECK MASS;  Surgeon: Drema Halon, MD;  Location: [facility];  Service: ENT;  Laterality: Right;   MASS  EXCISION Bilateral 03/23/2019   Procedure: EXCISIONS OF RIGHT CHEEK LESION, RIGHT POSTERIOR NECK LIPOMA  AND LEFT NECK NODULE;  Surgeon: Drema Halon, MD;  Location: [facility];  Service: ENT;  Laterality: Bilateral;   NASAL FRACTURE SURGERY  1980   REVERSE SHOULDER ARTHROPLASTY Left 01/04/2023   Procedure: LEFT REVERSE SHOULDER ARTHROPLASTY;  Surgeon: Cammy Copa, MD;  Location: [facility];  Service: Orthopedics;  Laterality: Left;   TOTAL HIP ARTHROPLASTY Left 12/18/2021   Procedure: LEFT TOTAL HIP ARTHROPLASTY ANTERIOR APPROACH;  Surgeon: Kathryne Hitch, MD;  Location: [facility];  Service: Orthopedics;  Laterality: Left;   TOTAL KNEE ARTHROPLASTY Right 06/01/2016   Procedure: TOTAL KNEE ARTHROPLASTY;  Surgeon: Valeria Batman, MD;  Location: [facility] OR;  Service: Orthopedics;  Laterality: Right;   TOTAL KNEE ARTHROPLASTY Left 07/25/2018   Procedure: LEFT TOTAL KNEE ARTHROPLASTY;  Surgeon: Valeria Batman, MD;  Location: [facility] OR;  Service: Orthopedics;  Laterality: Left;   ULNAR NERVE TRANSPOSITION Left 11/25/2020   Procedure: ulnar nerve decompression left elbow;  Surgeon: Valeria Batman, MD;  Location: [facility] OR;  Service: Orthopedics;  Laterality: Left;   Patient Active Problem List   Diagnosis Date Noted   Left rotator cuff tear arthropathy 01/08/2023   Biceps  tendonitis on left 01/08/2023   S/P reverse total shoulder arthroplasty, left 01/04/2023   Status post total replacement of left hip 12/18/2021   Pain in left hip 09/03/2021   Neuropathy, 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   Cervical spondylosis with myelopathy and radiculopathy 04/14/2020   Impingement syndrome of right shoulder 10/10/2019   Low back pain 10/10/2019   Irritation of ulnar nerve, left 08/28/2019   Class 2 obesity due to excess calories without serious comorbidity with body mass index (BMI) of 39.0 to 39.9 in adult 01/08/2019   Pain in right elbow 12/06/2018   Primary osteoarthritis of left knee  07/25/2018   History of left knee replacement 07/25/2018   Right foot pain 06/22/2018   Acute left-sided low back pain with left-sided sciatica 06/22/2018   Osteoarthritis 11/21/2017   Hyperlipidemia 11/21/2017   Low testosterone 09/26/2016   Healthcare maintenance 09/26/2016   S/P total knee replacement using cement, right 06/01/2016   Sleep apnea 05/19/2016   Hypertension 05/19/2016    PCP: Lavada Mesi MD  REFERRING PROVIDER: Cammy Copa, MD  REFERRING DIAG: [phone] (ICD-10-CM) - S/P reverse total shoulder arthroplasty, left  THERAPY DIAG:  Chronic left shoulder pain  Muscle weakness (generalized)  Abnormal posture  Localized edema  Rationale for Evaluation and Treatment: Rehabilitation  ONSET DATE: surgery 01/04/2023  SUBJECTIVE:                                                                                                                                                                                      SUBJECTIVE STATEMENT: Pt reported reduced symptoms today.   Can still feel it at times and reported the several days since last visit were still bothersome.  Reported doing pulleys only.   PERTINENT HISTORY: Previous C5-6 discectomy, presenting for C3-4, C4-5, C6-7 ACDF due to pain. PMH includes: arthritis, HTN, PNA, sleep apnea.   PAIN:  NPRS scale: no pain upon arrival.  Pain location: Lt shoulder  Pain description: ache, burning  Aggravating factors: after CPM use Relieving factors: ice  PRECAUTIONS: Avoid active IR strengthening, ER past 30 deg 4-6 weeks  WEIGHT BEARING RESTRICTIONS: No  FALLS:  Has patient fallen in last 6 months? 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  PLOF: Independent, Rt handed.  Reading, yard work (limited). Walking.   PATIENT GOALS:Reduce pain, get range better.   OBJECTIVE:   PATIENT SURVEYS:   01/20/2023 FOTO intake:   50  predicted:  65  COGNITION: 01/20/2023 Overall cognitive status: WFL     SENSATION: 01/20/2023 WFL  POSTURE: 01/20/2023 Rounded shoulders  Localized edema Lt shoulder post surgery.   UPPER EXTREMITY ROM:   ROM Right 01/20/2023 Left 01/20/2023 Left 01/27/2023  Shoulder flexion WFL 90 AROM in supine  120 PROM in supine 140 deg PROM in supine  Shoulder extension     Shoulder abduction WFL 90 PROM in supine    Shoulder adduction     Shoulder internal rotation     Shoulder external rotation  PROM 10 deg  in 30 deg abduction    Elbow flexion     Elbow extension     Wrist flexion     Wrist extension     Wrist ulnar deviation     Wrist radial deviation     Wrist pronation     Wrist supination     (Blank rows = not tested)  UPPER EXTREMITY MMT:  MMT Right 01/20/2023 Left 01/20/2023  Held shoulder based MMT due to surgery protocol Left 02/07/2023  Shoulder flexion 5/5    Shoulder extension     Shoulder abduction 5/5    Shoulder adduction     Shoulder internal rotation 5/5    Shoulder external rotation 5/5    Middle trapezius     Lower trapezius     Elbow flexion 5/5 5/5 4/5  Elbow extension 5/5 5/5   Wrist flexion     Wrist extension     Wrist ulnar deviation     Wrist radial deviation     Wrist pronation     Wrist supination     Grip strength (lbs)     (Blank rows = not tested)  SHOULDER SPECIAL TESTS: 01/20/2023 (+) Shrug noted in active Lt shoulder flexion attempt vs. Gravity.   JOINT MOBILITY TESTING:  01/20/2023 Guarding noted but not specific joint mobility noted.   PALPATION:  01/20/2023 General tenderness around Lt shoulder.                                                                                                                                                                                                  TODAY'S TREATMENT:  DATE: 02/10/2023 Therex: UBE fwd/back 3 mins each way lvl 2.5 Supine AAROM wand 1lb x 15 flexion  Supine 90 deg flexion stabilizations holds mild resistance 30 sec x 3 Standing green band rows 2 x 15 Standing gh ext green band 2 x 15   Manual: Supine Lt shoulder passive range all direction.   TODAY'S TREATMENT:                                                                                                       DATE: 02/07/2023 Therex: Pulleys flexion and scaption x 3 min each Standing green band bilateral rows x 20 ( end range reaching forward created some of similar pain reported from weekend) Standing green band GH extension bilaterally x 20  Supine Lt shoulder AROM 2 x 20 Sideyling Lt shoulder AROM abduction x 20  Seated AAROM 1 lb bar flexion x 10    TODAY'S TREATMENT:                                                                                                       DATE: 02/03/2023 Therex: Pulleys flexion and scaption x 2 min each Wall ladder flexion and scaption x 10 reps each with 1# wrist weight Seated AA shoulder flexion 2# bar 2x10 Seated AA shoulder abduction 1# bar x10 Rows L4 band 2x10; 5 sec hold High row L4 band 2x10  TODAY'S TREATMENT:                                                                                                       DATE: 02/01/2023 Therex: Pulleys flexion and scaption x 3 min each Wall ladder flexion and scaption x 10 reps each with 1# wrist weight Supine AAROM flexion with 1 lb bar x 20 Supine Lt shoulder flexion with 1/4# 2x10 Shoulder circles in supine and 90 deg flexion and 1/4#; x 20 each direction Seated AA shoulder flexion 1# bar 2x10 Seated AA shoulder abduction 1# bar 2x10  PATIENT EDUCATION: 01/20/2023 Education details: HEP, POC Person educated: Patient Education method: Programmer, multimedia, Facilities manager, Verbal cues, and Handouts Education comprehension: verbalized understanding, returned demonstration, and verbal cues  required  HOME EXERCISE PROGRAM: Access Code: [email or web address] URL: [email or web address] Date: 02/03/2023 Prepared by: Moshe Cipro  Exercises - Supine Shoulder Flexion Extension AAROM with Dowel  - 1-2 x daily - 7 x weekly - 1-2 sets - 10-15 reps - 3 hold - Supine Shoulder Protraction with Dowel  - 1-2 x daily - 7 x weekly - 1-2 sets - 10-15 reps - 3-5 hold - Seated Shoulder Flexion Self PROM  - 1-2 x daily - 7 x weekly - 1-2 sets - 10 reps - 3 hold - Seated Scapular Retraction  - 3-5 x daily - 7 x weekly - 1 sets - 10 reps - 3-5 hold - Standing Shoulder Row with Anchored Resistance  - 1-2 x daily - 7 x weekly - 2 sets - 10 reps - 5 sec hold - Shoulder extension with resistance - Neutral  - 1-2 x daily - 7 x weekly - 2 sets - 10 reps - 5 sec hold  ASSESSMENT:  CLINICAL IMPRESSION: Testing of supination did show mild to moderate weakness with pain still noted in upper arm.  Elbow flexion testing was fine.   Continued to adjust active movement to avoid exacerbation of symptoms.  Pt to benefit from continued skilled PT services.   OBJECTIVE IMPAIRMENTS: decreased activity tolerance, decreased coordination, decreased endurance, decreased mobility, decreased ROM, decreased strength, increased edema, increased fascial restrictions, impaired perceived functional ability, impaired flexibility, impaired UE functional use, postural dysfunction, and pain.   ACTIVITY LIMITATIONS: carrying, lifting, sleeping, bed mobility, bathing, dressing, self feeding, reach over head, and hygiene/grooming  PARTICIPATION LIMITATIONS: meal prep, cleaning, laundry, interpersonal relationship, driving, community activity, occupation, and yard work  PERSONAL FACTORS:  No specific limitations  are also affecting patient's functional outcome.   REHAB POTENTIAL: Good  CLINICAL DECISION MAKING: Stable/uncomplicated  EVALUATION COMPLEXITY: Low   GOALS: Goals reviewed with patient? Yes  SHORT TERM  GOALS: (target date for Short term goals are 3 weeks 02/10/2023)  1.Patient will demonstrate independent use of home exercise program to maintain progress from in clinic treatments. Goal status: Met   LONG TERM GOALS: (target dates for all long term goals are 10 weeks  03/31/2023 )   1. Patient will demonstrate/report pain at worst less than or equal to 2/10 to facilitate minimal limitation in daily activity secondary to pain symptoms. Goal status: New   2. Patient will demonstrate independent use of home exercise program to facilitate ability to maintain/progress functional gains from skilled physical therapy services. Goal status: New   3. Patient will demonstrate FOTO outcome > or = 65 % to indicate reduced disability due to condition. Goal status: New   4.  Patient will demonstrate Lt UE MMT 4/5 or greater  throughout to facilitate lifting, reaching, carrying at [facility] in daily activity.  Goal status: New   5.  Patient will demonstrate Lt  [facility] joint AROM WFL s symptoms to facilitate usual overhead reaching, self care, dressing at PLOF.   Goal status: New   6.  Patient will demonstrate/report ability to perform routine yard work at [employer] s limitation.  Goal status: New   PLAN:  PT FREQUENCY: 1-2x/week (start at 2)  PT DURATION: 10 weeks  PLANNED INTERVENTIONS: Therapeutic exercises, Therapeutic activity, Neuro Muscular re-education, Balance training, Gait training, Patient/Family education, Joint mobilization, Stair training, DME instructions, Dry Needling, Electrical stimulation, Traction, Cryotherapy, vasopneumatic device Moist heat, Taping, Ultrasound, Ionotophoresis 4mg /ml Dexamethasone, and aquatic therapy ,Manual therapy.  All included unless contraindicated  PLAN FOR NEXT SESSION:   MD note  Avoid active IR strengthening, ER past 30 deg 4-6 weeks  NEXT MD  APPT: 02/16/23   Chyrel Masson, PT, DPT, OCS, ATC 02/10/23  8:39 AM

## 2023-02-15 ENCOUNTER — Ambulatory Visit: Payer: PPO | Admitting: Rehabilitative and Restorative Service Providers"

## 2023-02-15 ENCOUNTER — Encounter: Payer: Self-pay | Admitting: Rehabilitative and Restorative Service Providers"

## 2023-02-15 DIAGNOSIS — M25512 Pain in left shoulder: Secondary | ICD-10-CM | POA: Diagnosis not present

## 2023-02-15 DIAGNOSIS — R293 Abnormal posture: Secondary | ICD-10-CM | POA: Diagnosis not present

## 2023-02-15 DIAGNOSIS — R600 Localized edema: Secondary | ICD-10-CM

## 2023-02-15 DIAGNOSIS — G8929 Other chronic pain: Secondary | ICD-10-CM | POA: Diagnosis not present

## 2023-02-15 DIAGNOSIS — M6281 Muscle weakness (generalized): Secondary | ICD-10-CM

## 2023-02-15 NOTE — Therapy (Signed)
OUTPATIENT PHYSICAL THERAPY TREATMENT / PROGRESS NOTE   Patient Name: James Gallegos MRN: [id number] DOB:[birth year], 72 y.o., male Today's Date: 02/15/2023   Progress Note Reporting Period 01/20/2023 to 02/15/2023  See note below for Objective Data and Assessment of Progress/Goals.     END OF SESSION:  PT End of Session - 02/15/23 0851     Visit Number 7    Number of Visits 20    Date for PT Re-Evaluation 03/31/23    Authorization Type [employer]    PT Start Time 0847    PT Stop Time 0925    PT Time Calculation (min) 38 min    Activity Tolerance Patient tolerated treatment well    Behavior During Therapy [facility] for tasks assessed/performed                   Past Medical History:  Diagnosis Date   Arthritis    back , R foot -   Deep vein thrombosis (HCC)    after knee surgery in 2004   Diabetes mellitus without complication (HCC)    Patients not aware of being dx   Hypertension    Murmur, cardiac    pt. reports its difficult to auscultate    Sleep apnea    CPAP- last study- 2014 cpap set on 13 per pt   Wears glasses 11/20/2020   for reading   Past Surgical History:  Procedure Laterality Date   ANTERIOR CERVICAL DECOMP/DISCECTOMY FUSION N/A 04/14/2020   Procedure: ANTERIOR CERVICAL DECOMPRESSION/DISCECTOMY FUSION, INTERBODY PROSTHESIS, PLATE/SCREWS CERVICAL THREE-FOUR,CERVICAL FOUR-FIVE,CERVICAL SIX-SEVEN;  Surgeon: Tressie Stalker, MD;  Location: [facility] OR;  Service: Neurosurgery;  Laterality: N/A;  anterior   BICEPT TENODESIS Left 01/04/2023   Procedure: LEFT BICEPS TENODESIS;  Surgeon: Cammy Copa, MD;  Location: [facility] ORS;  Service: Orthopedics;  Laterality: Left;   CERVICAL SPINE SURGERY N/A 1990   C5-6 discectomy plates and screws in neck per pt   colonscopy  2017   polyps removed per pt follow up in 5 years   FINGER SURGERY Right 2008   x 3 index finger   KNEE ARTHROSCOPY Bilateral    L 2004, R 2005   MASS EXCISION Right 01/18/2017    Procedure: EXCISION BENIGN RIGHT NECK MASS;  Surgeon: Drema Halon, MD;  Location: [facility];  Service: ENT;  Laterality: Right;   MASS EXCISION Bilateral 03/23/2019   Procedure: EXCISIONS OF RIGHT CHEEK LESION, RIGHT POSTERIOR NECK LIPOMA  AND LEFT NECK NODULE;  Surgeon: Drema Halon, MD;  Location: [facility];  Service: ENT;  Laterality: Bilateral;   NASAL FRACTURE SURGERY  1980   REVERSE SHOULDER ARTHROPLASTY Left 01/04/2023   Procedure: LEFT REVERSE SHOULDER ARTHROPLASTY;  Surgeon: Cammy Copa, MD;  Location: [facility];  Service: Orthopedics;  Laterality: Left;   TOTAL HIP ARTHROPLASTY Left 12/18/2021   Procedure: LEFT TOTAL HIP ARTHROPLASTY ANTERIOR APPROACH;  Surgeon: Kathryne Hitch, MD;  Location: [facility];  Service: Orthopedics;  Laterality: Left;   TOTAL KNEE ARTHROPLASTY Right 06/01/2016   Procedure: TOTAL KNEE ARTHROPLASTY;  Surgeon: Valeria Batman, MD;  Location: [facility] OR;  Service: Orthopedics;  Laterality: Right;   TOTAL KNEE ARTHROPLASTY Left 07/25/2018   Procedure: LEFT TOTAL KNEE ARTHROPLASTY;  Surgeon: Valeria Batman, MD;  Location: [facility] OR;  Service: Orthopedics;  Laterality: Left;   ULNAR NERVE TRANSPOSITION Left 11/25/2020   Procedure: ulnar nerve decompression left elbow;  Surgeon: Valeria Batman, MD;  Location: [facility] OR;  Service: Orthopedics;  Laterality: Left;   Patient Active Problem List   Diagnosis Date Noted   Left rotator cuff tear arthropathy 01/08/2023   Biceps tendonitis on left 01/08/2023   S/P reverse total shoulder arthroplasty, left 01/04/2023   Status post total replacement of left hip 12/18/2021   Pain in left hip 09/03/2021   Neuropathy, 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   Cervical spondylosis with myelopathy and radiculopathy 04/14/2020   Impingement syndrome of right shoulder 10/10/2019   Low back pain 10/10/2019   Irritation of ulnar nerve, left 08/28/2019   Class 2 obesity due to excess calories  without serious comorbidity with body mass index (BMI) of 39.0 to 39.9 in adult 01/08/2019   Pain in right elbow 12/06/2018   Primary osteoarthritis of left knee 07/25/2018   History of left knee replacement 07/25/2018   Right foot pain 06/22/2018   Acute left-sided low back pain with left-sided sciatica 06/22/2018   Osteoarthritis 11/21/2017   Hyperlipidemia 11/21/2017   Low testosterone 09/26/2016   Healthcare maintenance 09/26/2016   S/P total knee replacement using cement, right 06/01/2016   Sleep apnea 05/19/2016   Hypertension 05/19/2016    PCP: Lavada Mesi MD  REFERRING PROVIDER: Cammy Copa, MD  REFERRING DIAG: [id number] (ICD-10-CM) - S/P reverse total shoulder arthroplasty, left  THERAPY DIAG:  Chronic left shoulder pain  Muscle weakness (generalized)  Abnormal posture  Localized edema  Rationale for Evaluation and Treatment: Rehabilitation  ONSET DATE: surgery 01/04/2023  SUBJECTIVE:                                                                                                                                                                                      SUBJECTIVE STATEMENT: Pt indicated having continued slow improvement in symptoms since that instance of sharp pain noted when washing hands > 1 week ago.  Reported up to 4-5/10 at worst in last week.     Reported Lt arm use continued to show improvement.    Reported some stiffness at end range.  GROC +6 a great deal better.   PERTINENT HISTORY: Previous C5-6 discectomy, presenting for C3-4, C4-5, C6-7 ACDF due to pain. PMH includes: arthritis, HTN, PNA, sleep apnea.   PAIN:  NPRS scale: at best 0/10, at worst in last week 5/10 Pain location: Lt shoulder  Pain description: ache, burning  Aggravating factors: after CPM use Relieving factors: ice  PRECAUTIONS: Avoid active IR strengthening, ER past 30 deg 4-6 weeks  WEIGHT BEARING RESTRICTIONS: No  FALLS:  Has patient fallen in last 6  months? 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  PLOF: Independent, Rt handed.  Reading, yard work (limited). Walking.   PATIENT GOALS:Reduce pain, get range better.   OBJECTIVE:   PATIENT SURVEYS:    02/15/2023:  FOTO update:   59  01/20/2023 FOTO intake:   50  predicted:  65  COGNITION: 01/20/2023 Overall cognitive status: WFL     SENSATION: 01/20/2023 WFL  POSTURE: 01/20/2023 Rounded shoulders   Localized edema Lt shoulder post surgery.   UPPER EXTREMITY ROM:   ROM Right 01/20/2023 Left 01/20/2023 Left 01/27/2023 Left 02/15/2023 AROM in supine  Shoulder flexion WFL 90 AROM in supine  120 PROM in supine 140 deg PROM in supine 138 AROM   Shoulder extension      Shoulder abduction WFL 90 PROM in supine   118 AROM  Shoulder adduction      Shoulder internal rotation      Shoulder external rotation  PROM 10 deg  in 30 deg abduction   35 in 45 deg abduction supine  Elbow flexion      Elbow extension      Wrist flexion      Wrist extension      Wrist ulnar deviation      Wrist radial deviation      Wrist pronation      Wrist supination      (Blank rows = not tested)  UPPER EXTREMITY MMT:  MMT Right 01/20/2023 Left 01/20/2023  Held shoulder based MMT due to surgery protocol Left 02/07/2023 Left 02/15/2023  Shoulder flexion 5/5   3+/5  Shoulder extension      Shoulder abduction 5/5     Shoulder adduction      Shoulder internal rotation 5/5     Shoulder external rotation 5/5     Middle trapezius      Lower trapezius      Elbow flexion 5/5 5/5 4/5   Elbow extension 5/5 5/5    Wrist flexion      Wrist extension      Wrist ulnar deviation      Wrist radial deviation      Wrist pronation      Wrist supination      Grip strength (lbs)      (Blank rows = not tested)  SHOULDER SPECIAL TESTS: 01/20/2023 (+) Shrug noted in active Lt shoulder flexion attempt vs.  Gravity.   JOINT MOBILITY TESTING:  01/20/2023 Guarding noted but not specific joint mobility noted.   PALPATION:  01/20/2023 General tenderness around Lt shoulder.  TODAY'S TREATMENT:                                                                                                       DATE: 02/15/2023 Therex: Pulley flexion, scaption 3 mins each way AAROM Supine AROM 1lb 2 x 15  Sidelying Lt shoulder Abduction 1 lb 2 x 15 Sidelying Lt shoulder ER c towel under arm 1 lb 2 x 15  Standing blue band rows, gh ext x 20 each  Seated lat pull down blue band 2 x 15  Small red pball flexion rolls up wall x 15 AAROM  TODAY'S TREATMENT:                                                                                                       DATE: 02/10/2023 Therex: UBE fwd/back 3 mins each way lvl 2.5 Supine AAROM wand 1lb x 15 flexion Supine 90 deg flexion stabilizations holds mild resistance 30 sec x 3 Standing green band rows 2 x 15 Standing gh ext green band 2 x 15   Manual: Supine Lt shoulder passive range all direction.   TODAY'S TREATMENT:                                                                                                       DATE: 02/07/2023 Therex: Pulleys flexion and scaption x 3 min each Standing green band bilateral rows x 20 ( end range reaching forward created some of similar pain reported from weekend) Standing green band GH extension bilaterally x 20  Supine Lt shoulder AROM 2 x 20 Sideyling Lt shoulder AROM abduction x 20  Seated AAROM 1 lb bar flexion x 10    TODAY'S TREATMENT:  DATE: 02/03/2023 Therex: Pulleys flexion and scaption x 2 min each Wall ladder flexion and  scaption x 10 reps each with 1# wrist weight Seated AA shoulder flexion 2# bar 2x10 Seated AA shoulder abduction 1# bar x10 Rows L4 band 2x10; 5 sec hold High row L4 band 2x10  PATIENT EDUCATION: 01/20/2023 Education details: HEP, POC Person educated: Patient Education method: Programmer, multimedia, Facilities manager, Verbal cues, and Handouts Education comprehension: verbalized understanding, returned demonstration, and verbal cues required  HOME EXERCISE PROGRAM: Access Code: [email or web address] URL: [email or web address] Date: 02/15/2023 Prepared by: Chyrel Masson  Exercises - Supine Shoulder Flexion Extension AAROM with Dowel  - 1-2 x daily - 7 x weekly - 1-2 sets - 10-15 reps - 3 hold - Supine Shoulder Protraction with Dowel  - 1-2 x daily - 7 x weekly - 1-2 sets - 10-15 reps - 3-5 hold - Seated Shoulder Flexion Self PROM  - 1-2 x daily - 7 x weekly - 1-2 sets - 10 reps - 3 hold - Seated Scapular Retraction  - 3-5 x daily - 7 x weekly - 1 sets - 10 reps - 3-5 hold - Standing Shoulder Row with Anchored Resistance  - 1-2 x daily - 7 x weekly - 2 sets - 10 reps - 5 sec hold - Shoulder extension with resistance - Neutral  - 1-2 x daily - 7 x weekly - 2 sets - 10 reps - 5 sec hold - Supine Shoulder Flexion Extension Full Range AROM (Mirrored)  - 1-2 x daily - 7 x weekly - 2-3 sets - 10-15 reps - Sidelying Shoulder Abduction Palm Forward (Mirrored)  - 1-2 x daily - 7 x weekly - 2-3 sets - 10-15 reps - Sidelying Shoulder External Rotation  - 1-2 x daily - 7 x weekly - 2-3 sets - 10-15 reps  ASSESSMENT:  CLINICAL IMPRESSION: The patient has attended 7 visits over the course of treatment cycle.  Patient has reported  global rating of change at +6 a great deal better.  See objective data above for updated information regarding current presentation.   Pt has demonstrated steady overall gains in active movement ability and FOTO update as noted.  PT has been impacted in last 2 weeks following pain noted  while washing hands that resulted in pain with biceps activation testing/activity.  Some improvements have been noted since instance but still has some irritations noted.    OBJECTIVE IMPAIRMENTS: decreased activity tolerance, decreased coordination, decreased endurance, decreased mobility, decreased ROM, decreased strength, increased edema, increased fascial restrictions, impaired perceived functional ability, impaired flexibility, impaired UE functional use, postural dysfunction, and pain.   ACTIVITY LIMITATIONS: carrying, lifting, sleeping, bed mobility, bathing, dressing, self feeding, reach over head, and hygiene/grooming  PARTICIPATION LIMITATIONS: meal prep, cleaning, laundry, interpersonal relationship, driving, community activity, occupation, and yard work  PERSONAL FACTORS:  No specific limitations  are also affecting patient's functional outcome.   REHAB POTENTIAL: Good  CLINICAL DECISION MAKING: Stable/uncomplicated  EVALUATION COMPLEXITY: Low   GOALS: Goals reviewed with patient? Yes  SHORT TERM GOALS: (target date for Short term goals are 3 weeks 02/10/2023)  1.Patient will demonstrate independent use of home exercise program to maintain progress from in clinic treatments. Goal status: Met   LONG TERM GOALS: (target dates for all long term goals are 10 weeks  03/31/2023 )   1. Patient will demonstrate/report pain at worst less than or equal to 2/10 to facilitate minimal limitation in daily activity secondary to pain symptoms. Goal status: on going 02/15/2023  2. Patient will demonstrate independent use of home exercise program to facilitate ability to maintain/progress functional gains from skilled physical therapy services. Goal status: on going 02/15/2023   3. Patient will demonstrate FOTO outcome > or = 65 % to indicate reduced disability due to condition. Goal status: on going 02/15/2023   4.  Patient will demonstrate Lt UE MMT 4/5 or greater  throughout to facilitate  lifting, reaching, carrying at [facility] in daily activity.  Goal status: on going 02/15/2023   5.  Patient will demonstrate Lt  [facility] joint AROM WFL s symptoms to facilitate usual overhead reaching, self care, dressing at PLOF.   Goal status: on going 02/15/2023   6.  Patient will demonstrate/report ability to perform routine yard work at [employer] s limitation.  Goal status: on going 02/15/2023   PLAN:  PT FREQUENCY: 1-2x/week (start at 2)  PT DURATION: 10 weeks  PLANNED INTERVENTIONS: Therapeutic exercises, Therapeutic activity, Neuro Muscular re-education, Balance training, Gait training, Patient/Family education, Joint mobilization, Stair training, DME instructions, Dry Needling, Electrical stimulation, Traction, Cryotherapy, vasopneumatic device Moist heat, Taping, Ultrasound, Ionotophoresis 4mg /ml Dexamethasone, and aquatic therapy ,Manual therapy.  All included unless contraindicated  PLAN FOR NEXT SESSION:  Continue to improve active range, movements against gravity.  Follow up on MD visit update.   NEXT MD APPT: 02/16/23   Chyrel Masson, PT, DPT, OCS, ATC 02/15/23  9:22 AM

## 2023-02-16 ENCOUNTER — Encounter: Payer: PPO | Admitting: Rehabilitative and Restorative Service Providers"

## 2023-02-16 ENCOUNTER — Encounter: Payer: Self-pay | Admitting: Orthopedic Surgery

## 2023-02-16 ENCOUNTER — Ambulatory Visit (INDEPENDENT_AMBULATORY_CARE_PROVIDER_SITE_OTHER): Payer: PPO | Admitting: Orthopedic Surgery

## 2023-02-16 DIAGNOSIS — Z96612 Presence of left artificial shoulder joint: Secondary | ICD-10-CM

## 2023-02-16 NOTE — Progress Notes (Unsigned)
Post-Op Visit Note   Patient: James Gallegos           Date of Birth: [birth year]           MRN: [id number] Visit Date: 02/16/2023 PCP: Lavada Mesi, MD   Assessment & Plan:  Chief Complaint:  Chief Complaint  Patient presents with   Left Shoulder - Routine Post Op    L RSA, BICEPS TENODESIS (surgery date 01-04-23)   Visit Diagnoses:  1. S/P reverse total shoulder arthroplasty, left     Plan: [name] is a patient who is now 6 weeks out left reverse shoulder replacement biceps tenodesis.  Getting stronger.  2 weeks ago he had some sharp pain after lifting a bar.  Localizes to the biceps region.  He is doing stretching and pulling in therapy.  The biceps pain went away.  He is able to wash his hair at this time.  Rates his pain as 1 out of 10 occasionally.  After therapy he does tend to have a little bit more pain.  Taking ibuprofen.  Does use tramadol after therapy.  On examination his range of motion is 25/90/120.  Good subscap strength as well as external rotation strength.  Incision intact.  Plan at this time is to continue physical therapy and continue range of motion exercises.  Come back for final check in 6 weeks.  Follow-Up Instructions: No follow-ups on file.   Orders:  No orders of the defined types were placed in this encounter.  No orders of the defined types were placed in this encounter.   Imaging: No results found.  PMFS History: Patient Active Problem List   Diagnosis Date Noted   Left rotator cuff tear arthropathy 01/08/2023   Biceps tendonitis on left 01/08/2023   S/P reverse total shoulder arthroplasty, left 01/04/2023   Status post total replacement of left hip 12/18/2021   Pain in left hip 09/03/2021   Neuropathy, 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   Cervical spondylosis with myelopathy and radiculopathy 04/14/2020   Impingement syndrome of right shoulder 10/10/2019   Low back pain 10/10/2019   Irritation of ulnar nerve, left 08/28/2019   Class 2 obesity  due to excess calories without serious comorbidity with body mass index (BMI) of 39.0 to 39.9 in adult 01/08/2019   Pain in right elbow 12/06/2018   Primary osteoarthritis of left knee 07/25/2018   History of left knee replacement 07/25/2018   Right foot pain 06/22/2018   Acute left-sided low back pain with left-sided sciatica 06/22/2018   Osteoarthritis 11/21/2017   Hyperlipidemia 11/21/2017   Low testosterone 09/26/2016   Healthcare maintenance 09/26/2016   S/P total knee replacement using cement, right 06/01/2016   Sleep apnea 05/19/2016   Hypertension 05/19/2016   Past Medical History:  Diagnosis Date   Arthritis    back , R foot -   Deep vein thrombosis (HCC)    after knee surgery in 2004   Diabetes mellitus without complication (HCC)    Patients not aware of being dx   Hypertension    Murmur, cardiac    pt. reports its difficult to auscultate    Sleep apnea    CPAP- last study- 2014 cpap set on 13 per pt   Wears glasses 11/20/2020   for reading    Family History  Problem Relation Age of Onset   Dementia Mother    Alzheimer's disease Father    Healthy Brother    Colon cancer Neg Hx    Esophageal cancer Neg  Hx    Rectal cancer Neg Hx    Stomach cancer Neg Hx    Cancer Neg Hx    Diabetes Neg Hx     Past Surgical History:  Procedure Laterality Date   ANTERIOR CERVICAL DECOMP/DISCECTOMY FUSION N/A 04/14/2020   Procedure: ANTERIOR CERVICAL DECOMPRESSION/DISCECTOMY FUSION, INTERBODY PROSTHESIS, PLATE/SCREWS CERVICAL THREE-FOUR,CERVICAL FOUR-FIVE,CERVICAL SIX-SEVEN;  Surgeon: Tressie Stalker, MD;  Location: [facility] OR;  Service: Neurosurgery;  Laterality: N/A;  anterior   BICEPT TENODESIS Left 01/04/2023   Procedure: LEFT BICEPS TENODESIS;  Surgeon: Cammy Copa, MD;  Location: [facility];  Service: Orthopedics;  Laterality: Left;   CERVICAL SPINE SURGERY N/A 1990   C5-6 discectomy plates and screws in neck per pt   colonscopy  2017   polyps removed per pt follow up in  5 years   FINGER SURGERY Right 2008   x 3 index finger   KNEE ARTHROSCOPY Bilateral    L 2004, R 2005   MASS EXCISION Right 01/18/2017   Procedure: EXCISION BENIGN RIGHT NECK MASS;  Surgeon: Drema Halon, MD;  Location: [facility];  Service: ENT;  Laterality: Right;   MASS EXCISION Bilateral 03/23/2019   Procedure: EXCISIONS OF RIGHT CHEEK LESION, RIGHT POSTERIOR NECK LIPOMA  AND LEFT NECK NODULE;  Surgeon: Drema Halon, MD;  Location: [facility];  Service: ENT;  Laterality: Bilateral;   NASAL FRACTURE SURGERY  1980   REVERSE SHOULDER ARTHROPLASTY Left 01/04/2023   Procedure: LEFT REVERSE SHOULDER ARTHROPLASTY;  Surgeon: Cammy Copa, MD;  Location: [facility];  Service: Orthopedics;  Laterality: Left;   TOTAL HIP ARTHROPLASTY Left 12/18/2021   Procedure: LEFT TOTAL HIP ARTHROPLASTY ANTERIOR APPROACH;  Surgeon: Kathryne Hitch, MD;  Location: [facility];  Service: Orthopedics;  Laterality: Left;   TOTAL KNEE ARTHROPLASTY Right 06/01/2016   Procedure: TOTAL KNEE ARTHROPLASTY;  Surgeon: Valeria Batman, MD;  Location: [facility] OR;  Service: Orthopedics;  Laterality: Right;   TOTAL KNEE ARTHROPLASTY Left 07/25/2018   Procedure: LEFT TOTAL KNEE ARTHROPLASTY;  Surgeon: Valeria Batman, MD;  Location: [facility] OR;  Service: Orthopedics;  Laterality: Left;   ULNAR NERVE TRANSPOSITION Left 11/25/2020   Procedure: ulnar nerve decompression left elbow;  Surgeon: Valeria Batman, MD;  Location: [facility] OR;  Service: Orthopedics;  Laterality: Left;   Social History   Occupational History   Not on file  Tobacco Use   Smoking status: Never   Smokeless tobacco: Never  Vaping Use   Vaping status: Never Used  Substance and Sexual Activity   Alcohol use: Yes    Comment: rarely wine   Drug use: Never   Sexual activity: Not on file

## 2023-02-23 NOTE — Therapy (Signed)
OUTPATIENT PHYSICAL THERAPY TREATMENT   Patient Name: James Gallegos MRN: [id number] DOB:1950-10-31, 72 y.o., male Today's Date: 02/24/2023    END OF SESSION:  PT End of Session - 02/24/23 4401     Visit Number 8    Number of Visits 20    Date for PT Re-Evaluation 03/31/23    Authorization Type [employer]    PT Start Time 0802    PT Stop Time 0840    PT Time Calculation (min) 38 min    Activity Tolerance Patient tolerated treatment well    Behavior During Therapy [facility] for tasks assessed/performed                    Past Medical History:  Diagnosis Date   Arthritis    back , R foot -   Deep vein thrombosis (HCC)    after knee surgery in 2004   Diabetes mellitus without complication (HCC)    Patients not aware of being dx   Hypertension    Murmur, cardiac    pt. reports its difficult to auscultate    Sleep apnea    CPAP- last study- 2014 cpap set on 13 per pt   Wears glasses 11/20/2020   for reading   Past Surgical History:  Procedure Laterality Date   ANTERIOR CERVICAL DECOMP/DISCECTOMY FUSION N/A 04/14/2020   Procedure: ANTERIOR CERVICAL DECOMPRESSION/DISCECTOMY FUSION, INTERBODY PROSTHESIS, PLATE/SCREWS CERVICAL THREE-FOUR,CERVICAL FOUR-FIVE,CERVICAL SIX-SEVEN;  Surgeon: Tressie Stalker, MD;  Location: [facility] OR;  Service: Neurosurgery;  Laterality: N/A;  anterior   BICEPT TENODESIS Left 01/04/2023   Procedure: LEFT BICEPS TENODESIS;  Surgeon: Cammy Copa, MD;  Location: [facility];  Service: Orthopedics;  Laterality: Left;   CERVICAL SPINE SURGERY N/A 1990   C5-6 discectomy plates and screws in neck per pt   colonscopy  2017   polyps removed per pt follow up in 5 years   FINGER SURGERY Right 2008   x 3 index finger   KNEE ARTHROSCOPY Bilateral    L 2004, R 2005   MASS EXCISION Right 01/18/2017   Procedure: EXCISION BENIGN RIGHT NECK MASS;  Surgeon: Drema Halon, MD;  Location: [facility];  Service: ENT;   Laterality: Right;   MASS EXCISION Bilateral 03/23/2019   Procedure: EXCISIONS OF RIGHT CHEEK LESION, RIGHT POSTERIOR NECK LIPOMA  AND LEFT NECK NODULE;  Surgeon: Drema Halon, MD;  Location: [facility];  Service: ENT;  Laterality: Bilateral;   NASAL FRACTURE SURGERY  1980   REVERSE SHOULDER ARTHROPLASTY Left 01/04/2023   Procedure: LEFT REVERSE SHOULDER ARTHROPLASTY;  Surgeon: Cammy Copa, MD;  Location: [facility];  Service: Orthopedics;  Laterality: Left;   TOTAL HIP ARTHROPLASTY Left 12/18/2021   Procedure: LEFT TOTAL HIP ARTHROPLASTY ANTERIOR APPROACH;  Surgeon: Kathryne Hitch, MD;  Location: [facility];  Service: Orthopedics;  Laterality: Left;   TOTAL KNEE ARTHROPLASTY Right 06/01/2016   Procedure: TOTAL KNEE ARTHROPLASTY;  Surgeon: Valeria Batman, MD;  Location: [facility] OR;  Service: Orthopedics;  Laterality: Right;   TOTAL KNEE ARTHROPLASTY Left 07/25/2018   Procedure: LEFT TOTAL KNEE ARTHROPLASTY;  Surgeon: Valeria Batman, MD;  Location: [facility] OR;  Service: Orthopedics;  Laterality: Left;   ULNAR NERVE TRANSPOSITION Left 11/25/2020   Procedure: ulnar nerve decompression left elbow;  Surgeon: Valeria Batman, MD;  Location: [facility] OR;  Service: Orthopedics;  Laterality: Left;   Patient Active Problem List   Diagnosis Date Noted   Left rotator cuff tear arthropathy  01/08/2023   Biceps tendonitis on left 01/08/2023   S/P reverse total shoulder arthroplasty, left 01/04/2023   Status post total replacement of left hip 12/18/2021   Pain in left hip 09/03/2021   Neuropathy, 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   Cervical spondylosis with myelopathy and radiculopathy 04/14/2020   Impingement syndrome of right shoulder 10/10/2019   Low back pain 10/10/2019   Irritation of ulnar nerve, left 08/28/2019   Class 2 obesity due to excess calories without serious comorbidity with body mass index (BMI) of 39.0 to 39.9 in adult 01/08/2019   Pain in right elbow 12/06/2018   Primary  osteoarthritis of left knee 07/25/2018   History of left knee replacement 07/25/2018   Right foot pain 06/22/2018   Acute left-sided low back pain with left-sided sciatica 06/22/2018   Osteoarthritis 11/21/2017   Hyperlipidemia 11/21/2017   Low testosterone 09/26/2016   Healthcare maintenance 09/26/2016   S/P total knee replacement using cement, right 06/01/2016   Sleep apnea 05/19/2016   Hypertension 05/19/2016    PCP: Lavada Mesi MD  REFERRING PROVIDER: Cammy Copa, MD  REFERRING DIAG: [phone] (ICD-10-CM) - S/P reverse total shoulder arthroplasty, left  THERAPY DIAG:  Chronic left shoulder pain  Muscle weakness (generalized)  Abnormal posture  Localized edema  Rationale for Evaluation and Treatment: Rehabilitation  ONSET DATE: surgery 01/04/2023  SUBJECTIVE:                                                                                                                                                                                      SUBJECTIVE STATEMENT: Pt indicated getting pants/underwear is hard still.  Pt a little soreness upon arrival.   PERTINENT HISTORY: Previous C5-6 discectomy, presenting for C3-4, C4-5, C6-7 ACDF due to pain. PMH includes: arthritis, HTN, PNA, sleep apnea.   PAIN:  NPRS scale: current 1/10 Pain location: Lt shoulder  Pain description: ache, sore  Aggravating factors: after CPM use Relieving factors: ice  PRECAUTIONS: Avoid active IR strengthening, ER past 30 deg 4-6 weeks  WEIGHT BEARING RESTRICTIONS: No  FALLS:  Has patient fallen in last 6 months? 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  PLOF: Independent, Rt handed.  Reading, yard work (limited). Walking.   PATIENT GOALS:Reduce pain, get range better.   OBJECTIVE:   PATIENT SURVEYS:    02/15/2023:  FOTO update:   59  01/20/2023 FOTO intake:   50   predicted:  65  COGNITION: 01/20/2023 Overall cognitive status: WFL     SENSATION: 01/20/2023 WFL  POSTURE: 01/20/2023 Rounded shoulders   Localized edema Lt  shoulder post surgery.   UPPER EXTREMITY ROM:   ROM Right 01/20/2023 Left 01/20/2023 Left 01/27/2023 Left 02/15/2023 AROM in supine Left 02/24/2023  Shoulder flexion WFL 90 AROM in supine  120 PROM in supine 140 deg PROM in supine 138 AROM  145 AROM in supine  Shoulder extension       Shoulder abduction WFL 90 PROM in supine   118 AROM   Shoulder adduction       Shoulder internal rotation       Shoulder external rotation  PROM 10 deg  in 30 deg abduction   35 in 45 deg abduction supine   Elbow flexion       Elbow extension       Wrist flexion       Wrist extension       Wrist ulnar deviation       Wrist radial deviation       Wrist pronation       Wrist supination       (Blank rows = not tested)  UPPER EXTREMITY MMT:  MMT Right 01/20/2023 Left 01/20/2023  Held shoulder based MMT due to surgery protocol Left 02/07/2023 Left 02/15/2023  Shoulder flexion 5/5   3+/5  Shoulder extension      Shoulder abduction 5/5     Shoulder adduction      Shoulder internal rotation 5/5     Shoulder external rotation 5/5     Middle trapezius      Lower trapezius      Elbow flexion 5/5 5/5 4/5   Elbow extension 5/5 5/5    Wrist flexion      Wrist extension      Wrist ulnar deviation      Wrist radial deviation      Wrist pronation      Wrist supination      Grip strength (lbs)      (Blank rows = not tested)  SHOULDER SPECIAL TESTS: 01/20/2023 (+) Shrug noted in active Lt shoulder flexion attempt vs. Gravity.   JOINT MOBILITY TESTING:  01/20/2023 Guarding noted but not specific joint mobility noted.   PALPATION:  01/20/2023 General tenderness around Lt shoulder.                                                                                                                                                                                                   TODAY'S TREATMENT:  DATE: 02/23/2023 Therex: UBE fwd/back UE only lvl 2.5 4 mins each way Shoulder isometrics with arm at side - performed in flexion, abduction, ER 5 sec on/off for 12 each way (added to HEP) Supine AROM flexion 1lb 2 x 15  Sidelying Lt shoulder Abduction 1 lb 2 x 15 Wall slides flexion 5 sec hold x 10  Wall push up with SA press 2 sec hold x 15  TODAY'S TREATMENT:                                                                                                       DATE: 02/15/2023 Therex: Pulley flexion, scaption 3 mins each way AAROM Supine AROM 1lb 2 x 15  Sidelying Lt shoulder Abduction 1 lb 2 x 15 Sidelying Lt shoulder ER c towel under arm 1 lb 2 x 15  Standing blue band rows, gh ext x 20 each  Seated lat pull down blue band 2 x 15  Small red pball flexion rolls up wall x 15 AAROM  TODAY'S TREATMENT:                                                                                                       DATE: 02/10/2023 Therex: UBE fwd/back 3 mins each way lvl 2.5 Supine AAROM wand 1lb x 15 flexion Supine 90 deg flexion stabilizations holds mild resistance 30 sec x 3 Standing green band rows 2 x 15 Standing gh ext green band 2 x 15   Manual: Supine Lt shoulder passive range all direction.   TODAY'S TREATMENT:                                                                                                       DATE: 02/07/2023 Therex: Pulleys flexion and scaption x 3 min each Standing green band bilateral rows x 20 ( end range reaching forward created some of similar pain reported from weekend) Standing green band GH extension bilaterally x 20  Supine Lt shoulder AROM 2 x 20 Sideyling Lt shoulder AROM abduction x 20  Seated AAROM 1 lb bar flexion x 10    PATIENT EDUCATION: 01/20/2023 Education details: HEP update  Person educated:  Patient Education method: Explanation, Demonstration, Verbal cues, and Handouts  Education comprehension: verbalized understanding, returned demonstration, and verbal cues required  HOME EXERCISE PROGRAM: Access Code: [email or web address] URL: [email or web address] Date: 02/24/2023 Prepared by: Chyrel Masson  Exercises - Supine Shoulder Flexion Extension AAROM with Dowel  - 1-2 x daily - 7 x weekly - 1-2 sets - 10-15 reps - 3 hold - Seated Scapular Retraction  - 3-5 x daily - 7 x weekly - 1 sets - 10 reps - 3-5 hold - Standing Shoulder Row with Anchored Resistance  - 1-2 x daily - 7 x weekly - 2 sets - 10 reps - 5 sec hold - Shoulder extension with resistance - Neutral  - 1-2 x daily - 7 x weekly - 2 sets - 10 reps - 5 sec hold - Supine Shoulder Flexion Extension Full Range AROM (Mirrored)  - 1-2 x daily - 7 x weekly - 2-3 sets - 10-15 reps - Sidelying Shoulder Abduction Palm Forward (Mirrored)  - 1-2 x daily - 7 x weekly - 2-3 sets - 10-15 reps - Sidelying Shoulder External Rotation  - 1-2 x daily - 7 x weekly - 2-3 sets - 10-15 reps - Isometric Shoulder Flexion at Wall  - 1-2 x daily - 7 x weekly - 1 sets - 10 reps - 5 hold - Standing Isometric Shoulder Abduction with Doorway - Arm Bent  - 1-2 x daily - 7 x weekly - 1 sets - 10 reps - 5 hold - Standing Isometric Shoulder External Rotation with Doorway  - 1-2 x daily - 7 x weekly - 1 sets - 10 reps - 5 hold  ASSESSMENT:  CLINICAL IMPRESSION: Pt reported no specific concerns from MD about biceps related pain area.  Reported desire to continue with therapy per MD recommendation.  At this time, Pt to benefit from continued strength improvements for functional movement pattern improvements.     OBJECTIVE IMPAIRMENTS: decreased activity tolerance, decreased coordination, decreased endurance, decreased mobility, decreased ROM, decreased strength, increased edema, increased fascial restrictions, impaired perceived functional ability, impaired  flexibility, impaired UE functional use, postural dysfunction, and pain.   ACTIVITY LIMITATIONS: carrying, lifting, sleeping, bed mobility, bathing, dressing, self feeding, reach over head, and hygiene/grooming  PARTICIPATION LIMITATIONS: meal prep, cleaning, laundry, interpersonal relationship, driving, community activity, occupation, and yard work  PERSONAL FACTORS:  No specific limitations  are also affecting patient's functional outcome.   REHAB POTENTIAL: Good  CLINICAL DECISION MAKING: Stable/uncomplicated  EVALUATION COMPLEXITY: Low   GOALS: Goals reviewed with patient? Yes  SHORT TERM GOALS: (target date for Short term goals are 3 weeks 02/10/2023)  1.Patient will demonstrate independent use of home exercise program to maintain progress from in clinic treatments. Goal status: Met   LONG TERM GOALS: (target dates for all long term goals are 10 weeks  03/31/2023 )   1. Patient will demonstrate/report pain at worst less than or equal to 2/10 to facilitate minimal limitation in daily activity secondary to pain symptoms. Goal status: on going 02/15/2023   2. Patient will demonstrate independent use of home exercise program to facilitate ability to maintain/progress functional gains from skilled physical therapy services. Goal status: on going 02/15/2023   3. Patient will demonstrate FOTO outcome > or = 65 % to indicate reduced disability due to condition. Goal status: on going 02/15/2023   4.  Patient will demonstrate Lt UE MMT 4/5 or greater  throughout to facilitate lifting, reaching, carrying at [facility] in daily activity.  Goal status: on going 02/15/2023   5.  Patient will demonstrate Lt  GH joint AROM WFL s symptoms to facilitate usual overhead reaching, self care, dressing at PLOF.   Goal status: on going 02/15/2023   6.  Patient will demonstrate/report ability to perform routine yard work at [employer] s limitation.  Goal status: on going 02/15/2023   PLAN:  PT FREQUENCY: 1-2x/week  (start at 2)  PT DURATION: 10 weeks  PLANNED INTERVENTIONS: Therapeutic exercises, Therapeutic activity, Neuro Muscular re-education, Balance training, Gait training, Patient/Family education, Joint mobilization, Stair training, DME instructions, Dry Needling, Electrical stimulation, Traction, Cryotherapy, vasopneumatic device Moist heat, Taping, Ultrasound, Ionotophoresis 4mg /ml Dexamethasone, and aquatic therapy ,Manual therapy.  All included unless contraindicated  PLAN FOR NEXT SESSION:  Transitioning in strengthening to against gravity as able, avoid shrug.    Chyrel Masson, PT, DPT, OCS, ATC 02/24/23  8:35 AM

## 2023-02-24 ENCOUNTER — Ambulatory Visit: Payer: PPO | Admitting: Rehabilitative and Restorative Service Providers"

## 2023-02-24 ENCOUNTER — Encounter: Payer: Self-pay | Admitting: Rehabilitative and Restorative Service Providers"

## 2023-02-24 DIAGNOSIS — R600 Localized edema: Secondary | ICD-10-CM | POA: Diagnosis not present

## 2023-02-24 DIAGNOSIS — G8929 Other chronic pain: Secondary | ICD-10-CM

## 2023-02-24 DIAGNOSIS — M25512 Pain in left shoulder: Secondary | ICD-10-CM | POA: Diagnosis not present

## 2023-02-24 DIAGNOSIS — M6281 Muscle weakness (generalized): Secondary | ICD-10-CM

## 2023-02-24 DIAGNOSIS — R293 Abnormal posture: Secondary | ICD-10-CM

## 2023-03-04 ENCOUNTER — Encounter: Payer: Self-pay | Admitting: Rehabilitative and Restorative Service Providers"

## 2023-03-04 ENCOUNTER — Ambulatory Visit: Payer: PPO | Admitting: Rehabilitative and Restorative Service Providers"

## 2023-03-04 DIAGNOSIS — R600 Localized edema: Secondary | ICD-10-CM

## 2023-03-04 DIAGNOSIS — R293 Abnormal posture: Secondary | ICD-10-CM | POA: Diagnosis not present

## 2023-03-04 DIAGNOSIS — M6281 Muscle weakness (generalized): Secondary | ICD-10-CM | POA: Diagnosis not present

## 2023-03-04 DIAGNOSIS — M25512 Pain in left shoulder: Secondary | ICD-10-CM

## 2023-03-04 DIAGNOSIS — G8929 Other chronic pain: Secondary | ICD-10-CM | POA: Diagnosis not present

## 2023-03-04 NOTE — Therapy (Signed)
OUTPATIENT PHYSICAL THERAPY TREATMENT   Patient Name: SUMMIE HOEFERT MRN: [id number] DOB:06/01/1951, 72 y.o., male Today's Date: 03/04/2023    END OF SESSION:  PT End of Session - 03/04/23 0831     Visit Number 9    Number of Visits 20    Date for PT Re-Evaluation 03/31/23    Authorization Type [employer]    PT Start Time 0831    PT Stop Time 0910    PT Time Calculation (min) 39 min    Activity Tolerance Patient tolerated treatment well    Behavior During Therapy [facility] for tasks assessed/performed               Past Medical History:  Diagnosis Date   Arthritis    back , R foot -   Deep vein thrombosis (HCC)    after knee surgery in 2004   Diabetes mellitus without complication (HCC)    Patients not aware of being dx   Hypertension    Murmur, cardiac    pt. reports its difficult to auscultate    Sleep apnea    CPAP- last study- 2014 cpap set on 13 per pt   Wears glasses 11/20/2020   for reading   Past Surgical History:  Procedure Laterality Date   ANTERIOR CERVICAL DECOMP/DISCECTOMY FUSION N/A 04/14/2020   Procedure: ANTERIOR CERVICAL DECOMPRESSION/DISCECTOMY FUSION, INTERBODY PROSTHESIS, PLATE/SCREWS CERVICAL THREE-FOUR,CERVICAL FOUR-FIVE,CERVICAL SIX-SEVEN;  Surgeon: Tressie Stalker, MD;  Location: [facility] OR;  Service: Neurosurgery;  Laterality: N/A;  anterior   BICEPT TENODESIS Left 01/04/2023   Procedure: LEFT BICEPS TENODESIS;  Surgeon: Cammy Copa, MD;  Location: [facility] ORS;  Service: Orthopedics;  Laterality: Left;   CERVICAL SPINE SURGERY N/A 1990   C5-6 discectomy plates and screws in neck per pt   colonscopy  2017   polyps removed per pt follow up in 5 years   FINGER SURGERY Right 2008   x 3 index finger   KNEE ARTHROSCOPY Bilateral    L 2004, R 2005   MASS EXCISION Right 01/18/2017   Procedure: EXCISION BENIGN RIGHT NECK MASS;  Surgeon: Drema Halon, MD;  Location: [facility];  Service: ENT;  Laterality: Right;    MASS EXCISION Bilateral 03/23/2019   Procedure: EXCISIONS OF RIGHT CHEEK LESION, RIGHT POSTERIOR NECK LIPOMA  AND LEFT NECK NODULE;  Surgeon: Drema Halon, MD;  Location: [facility];  Service: ENT;  Laterality: Bilateral;   NASAL FRACTURE SURGERY  1980   REVERSE SHOULDER ARTHROPLASTY Left 01/04/2023   Procedure: LEFT REVERSE SHOULDER ARTHROPLASTY;  Surgeon: Cammy Copa, MD;  Location: [facility];  Service: Orthopedics;  Laterality: Left;   TOTAL HIP ARTHROPLASTY Left 12/18/2021   Procedure: LEFT TOTAL HIP ARTHROPLASTY ANTERIOR APPROACH;  Surgeon: Kathryne Hitch, MD;  Location: [facility];  Service: Orthopedics;  Laterality: Left;   TOTAL KNEE ARTHROPLASTY Right 06/01/2016   Procedure: TOTAL KNEE ARTHROPLASTY;  Surgeon: Valeria Batman, MD;  Location: [facility] OR;  Service: Orthopedics;  Laterality: Right;   TOTAL KNEE ARTHROPLASTY Left 07/25/2018   Procedure: LEFT TOTAL KNEE ARTHROPLASTY;  Surgeon: Valeria Batman, MD;  Location: [facility] OR;  Service: Orthopedics;  Laterality: Left;   ULNAR NERVE TRANSPOSITION Left 11/25/2020   Procedure: ulnar nerve decompression left elbow;  Surgeon: Valeria Batman, MD;  Location: [facility] OR;  Service: Orthopedics;  Laterality: Left;   Patient Active Problem List   Diagnosis Date Noted   Left rotator cuff tear arthropathy 01/08/2023   Biceps tendonitis  on left 01/08/2023   S/P reverse total shoulder arthroplasty, left 01/04/2023   Status post total replacement of left hip 12/18/2021   Pain in left hip 09/03/2021   Neuropathy, 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   Cervical spondylosis with myelopathy and radiculopathy 04/14/2020   Impingement syndrome of right shoulder 10/10/2019   Low back pain 10/10/2019   Irritation of ulnar nerve, left 08/28/2019   Class 2 obesity due to excess calories without serious comorbidity with body mass index (BMI) of 39.0 to 39.9 in adult 01/08/2019   Pain in right elbow 12/06/2018   Primary osteoarthritis of  left knee 07/25/2018   History of left knee replacement 07/25/2018   Right foot pain 06/22/2018   Acute left-sided low back pain with left-sided sciatica 06/22/2018   Osteoarthritis 11/21/2017   Hyperlipidemia 11/21/2017   Low testosterone 09/26/2016   Healthcare maintenance 09/26/2016   S/P total knee replacement using cement, right 06/01/2016   Sleep apnea 05/19/2016   Hypertension 05/19/2016    PCP: Lavada Mesi MD  REFERRING PROVIDER: Cammy Copa, MD  REFERRING DIAG: [phone] (ICD-10-CM) - S/P reverse total shoulder arthroplasty, left  THERAPY DIAG:  Chronic left shoulder pain  Muscle weakness (generalized)  Abnormal posture  Localized edema  Rationale for Evaluation and Treatment: Rehabilitation  ONSET DATE: surgery 01/04/2023  SUBJECTIVE:                                                                                                                                                                                      SUBJECTIVE STATEMENT: Pt indicated reaching forward and up was trouble at times, noted at desk but improved c several reps.  Reported not having pain with movement.   PERTINENT HISTORY: Previous C5-6 discectomy, presenting for C3-4, C4-5, C6-7 ACDF due to pain. PMH includes: arthritis, HTN, PNA, sleep apnea.   PAIN:  NPRS scale: no specific pain this morning.  Pain location: Lt shoulder  Pain description: ache, sore  Aggravating factors: after CPM use Relieving factors: ice  PRECAUTIONS: Avoid active IR strengthening, ER past 30 deg 4-6 weeks  WEIGHT BEARING RESTRICTIONS: No  FALLS:  Has patient fallen in last 6 months? 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  PLOF: Independent, Rt handed.  Reading, yard work (limited). Walking.   PATIENT GOALS:Reduce pain, get range better.   OBJECTIVE:   PATIENT SURVEYS:    02/15/2023:   FOTO update:   59  01/20/2023 FOTO intake:   50  predicted:  65  COGNITION: 01/20/2023 Overall cognitive status: WFL     SENSATION: 01/20/2023 [facility]  POSTURE: 01/20/2023 Rounded shoulders   Localized edema Lt shoulder post surgery.   UPPER EXTREMITY ROM:   ROM Right 01/20/2023 Left 01/20/2023 Left 01/27/2023 Left 02/15/2023 AROM in supine Left 02/24/2023  Shoulder flexion WFL 90 AROM in supine  120 PROM in supine 140 deg PROM in supine 138 AROM  145 AROM in supine  Shoulder extension       Shoulder abduction WFL 90 PROM in supine   118 AROM   Shoulder adduction       Shoulder internal rotation       Shoulder external rotation  PROM 10 deg  in 30 deg abduction   35 in 45 deg abduction supine   Elbow flexion       Elbow extension       Wrist flexion       Wrist extension       Wrist ulnar deviation       Wrist radial deviation       Wrist pronation       Wrist supination       (Blank rows = not tested)  UPPER EXTREMITY MMT:  MMT Right 01/20/2023 Left 01/20/2023  Held shoulder based MMT due to surgery protocol Left 02/07/2023 Left 02/15/2023 Left 03/04/2023  Shoulder flexion 5/5   3+/5 4/5 c pain  Shoulder extension       Shoulder abduction 5/5    4+/5  Shoulder adduction       Shoulder internal rotation 5/5      Shoulder external rotation 5/5    3+/5  Middle trapezius       Lower trapezius       Elbow flexion 5/5 5/5 4/5    Elbow extension 5/5 5/5     Wrist flexion       Wrist extension       Wrist ulnar deviation       Wrist radial deviation       Wrist pronation       Wrist supination       Grip strength (lbs)       (Blank rows = not tested)  SHOULDER SPECIAL TESTS: 01/20/2023 (+) Shrug noted in active Lt shoulder flexion attempt vs. Gravity.   JOINT MOBILITY TESTING:  01/20/2023 Guarding noted but not specific joint mobility noted.   PALPATION:  01/20/2023 General tenderness around Lt shoulder.                                                                                                                                                                                                   TODAY'S  TREATMENT:                                                                                                       DATE: 03/04/2023 Therex: UBE fwd/back UE only lvl 3.0 4 mins each way Machine chest press double arm with SA press hold 2 seconds 10 lbs 2 x 15  Supine Lt arm chest press with SA hold with dumbbell 3 lbs x 15  Supine 90 deg flexion circles clockwise, counterclockwise 3 lbs  2 x 20 each way Standing eccentric only flexion lowering Lt arm 2 x 10 from 90 deg to 0  Standing Lt shoulder abduction x 10  Standing high blue band attachment on wall for GH ext 2 x 10   TODAY'S TREATMENT:                                                                                                       DATE: 02/23/2023 Therex: UBE fwd/back UE only lvl 2.5 4 mins each way Shoulder isometrics with arm at side - performed in flexion, abduction, ER 5 sec on/off for 12 each way (added to HEP) Supine AROM flexion 1lb 2 x 15  Sidelying Lt shoulder Abduction 1 lb 2 x 15 Wall slides flexion 5 sec hold x 10  Wall push up with SA press 2 sec hold x 15  TODAY'S TREATMENT:                                                                                                       DATE: 02/15/2023 Therex: Pulley flexion, scaption 3 mins each way AAROM Supine AROM 1lb 2 x 15  Sidelying Lt shoulder Abduction 1 lb 2 x 15 Sidelying Lt shoulder ER c towel under arm 1 lb 2 x 15  Standing blue band rows, gh ext x 20 each  Seated lat pull down blue band 2 x 15  Small red pball flexion rolls up wall x 15 AAROM  TODAY'S TREATMENT:  DATE: 02/10/2023 Therex: UBE fwd/back 3 mins each way lvl 2.5 Supine AAROM wand 1lb x 15 flexion Supine 90 deg flexion stabilizations holds mild resistance 30 sec x  3 Standing green band rows 2 x 15 Standing gh ext green band 2 x 15   Manual: Supine Lt shoulder passive range all direction.    PATIENT EDUCATION: 01/20/2023 Education details: HEP update  Person educated: Patient Education method: Programmer, multimedia, Demonstration, Verbal cues, and Handouts Education comprehension: verbalized understanding, returned demonstration, and verbal cues required  HOME EXERCISE PROGRAM: Access Code: [email or web address] URL: [email or web address] Date: 02/24/2023 Prepared by: Chyrel Masson  Exercises - Supine Shoulder Flexion Extension AAROM with Dowel  - 1-2 x daily - 7 x weekly - 1-2 sets - 10-15 reps - 3 hold - Seated Scapular Retraction  - 3-5 x daily - 7 x weekly - 1 sets - 10 reps - 3-5 hold - Standing Shoulder Row with Anchored Resistance  - 1-2 x daily - 7 x weekly - 2 sets - 10 reps - 5 sec hold - Shoulder extension with resistance - Neutral  - 1-2 x daily - 7 x weekly - 2 sets - 10 reps - 5 sec hold - Supine Shoulder Flexion Extension Full Range AROM (Mirrored)  - 1-2 x daily - 7 x weekly - 2-3 sets - 10-15 reps - Sidelying Shoulder Abduction Palm Forward (Mirrored)  - 1-2 x daily - 7 x weekly - 2-3 sets - 10-15 reps - Sidelying Shoulder External Rotation  - 1-2 x daily - 7 x weekly - 2-3 sets - 10-15 reps - Isometric Shoulder Flexion at Wall  - 1-2 x daily - 7 x weekly - 1 sets - 10 reps - 5 hold - Standing Isometric Shoulder Abduction with Doorway - Arm Bent  - 1-2 x daily - 7 x weekly - 1 sets - 10 reps - 5 hold - Standing Isometric Shoulder External Rotation with Doorway  - 1-2 x daily - 7 x weekly - 1 sets - 10 reps - 5 hold  ASSESSMENT:  CLINICAL IMPRESSION: Pt to continue to benefit from efforts to improve strength in Lt arm to help improve functional reach and lift.  Reviewed standing elevation strengthening with eccentric focus for progression of strength in HEP.  Pt making steady over gains.    OBJECTIVE IMPAIRMENTS: decreased activity  tolerance, decreased coordination, decreased endurance, decreased mobility, decreased ROM, decreased strength, increased edema, increased fascial restrictions, impaired perceived functional ability, impaired flexibility, impaired UE functional use, postural dysfunction, and pain.   ACTIVITY LIMITATIONS: carrying, lifting, sleeping, bed mobility, bathing, dressing, self feeding, reach over head, and hygiene/grooming  PARTICIPATION LIMITATIONS: meal prep, cleaning, laundry, interpersonal relationship, driving, community activity, occupation, and yard work  PERSONAL FACTORS:  No specific limitations  are also affecting patient's functional outcome.   REHAB POTENTIAL: Good  CLINICAL DECISION MAKING: Stable/uncomplicated  EVALUATION COMPLEXITY: Low   GOALS: Goals reviewed with patient? Yes  SHORT TERM GOALS: (target date for Short term goals are 3 weeks 02/10/2023)  1.Patient will demonstrate independent use of home exercise program to maintain progress from in clinic treatments. Goal status: Met   LONG TERM GOALS: (target dates for all long term goals are 10 weeks  03/31/2023 )   1. Patient will demonstrate/report pain at worst less than or equal to 2/10 to facilitate minimal limitation in daily activity secondary to pain symptoms. Goal status: on going 02/15/2023   2. Patient will demonstrate independent use of home exercise program to facilitate ability  to maintain/progress functional gains from skilled physical therapy services. Goal status: on going 02/15/2023   3. Patient will demonstrate FOTO outcome > or = 65 % to indicate reduced disability due to condition. Goal status: on going 02/15/2023   4.  Patient will demonstrate Lt UE MMT 4/5 or greater  throughout to facilitate lifting, reaching, carrying at [facility] in daily activity.  Goal status: on going 02/15/2023   5.  Patient will demonstrate Lt  [facility] joint AROM WFL s symptoms to facilitate usual overhead reaching, self care, dressing at  PLOF.   Goal status: on going 02/15/2023   6.  Patient will demonstrate/report ability to perform routine yard work at [employer] s limitation.  Goal status: on going 02/15/2023   PLAN:  PT FREQUENCY: 1-2x/week (start at 2)  PT DURATION: 10 weeks  PLANNED INTERVENTIONS: Therapeutic exercises, Therapeutic activity, Neuro Muscular re-education, Balance training, Gait training, Patient/Family education, Joint mobilization, Stair training, DME instructions, Dry Needling, Electrical stimulation, Traction, Cryotherapy, vasopneumatic device Moist heat, Taping, Ultrasound, Ionotophoresis 4mg /ml Dexamethasone, and aquatic therapy ,Manual therapy.  All included unless contraindicated  PLAN FOR NEXT SESSION:  Progressive strengthening as able.    Chyrel Masson, PT, DPT, OCS, ATC 03/04/23  9:09 AM

## 2023-03-07 ENCOUNTER — Ambulatory Visit: Payer: PPO | Admitting: Rehabilitative and Restorative Service Providers"

## 2023-03-07 ENCOUNTER — Encounter: Payer: Self-pay | Admitting: Rehabilitative and Restorative Service Providers"

## 2023-03-07 DIAGNOSIS — M6281 Muscle weakness (generalized): Secondary | ICD-10-CM

## 2023-03-07 DIAGNOSIS — M25512 Pain in left shoulder: Secondary | ICD-10-CM

## 2023-03-07 DIAGNOSIS — R600 Localized edema: Secondary | ICD-10-CM

## 2023-03-07 DIAGNOSIS — R293 Abnormal posture: Secondary | ICD-10-CM | POA: Diagnosis not present

## 2023-03-07 DIAGNOSIS — G8929 Other chronic pain: Secondary | ICD-10-CM | POA: Diagnosis not present

## 2023-03-07 NOTE — Therapy (Signed)
OUTPATIENT PHYSICAL THERAPY TREATMENT   Patient Name: James Gallegos MRN: [id number] DOB:05-30-1951, 72 y.o., male Today's Date: 03/07/2023    END OF SESSION:  PT End of Session - 03/07/23 0836     Visit Number 10    Number of Visits 20    Date for PT Re-Evaluation 03/31/23    Authorization Type [employer]    PT Start Time 0840    PT Stop Time 0919    PT Time Calculation (min) 39 min    Activity Tolerance Patient tolerated treatment well    Behavior During Therapy [facility] for tasks assessed/performed                Past Medical History:  Diagnosis Date   Arthritis    back , R foot -   Deep vein thrombosis (HCC)    after knee surgery in 2004   Diabetes mellitus without complication (HCC)    Patients not aware of being dx   Hypertension    Murmur, cardiac    pt. reports its difficult to auscultate    Sleep apnea    CPAP- last study- 2014 cpap set on 13 per pt   Wears glasses 11/20/2020   for reading   Past Surgical History:  Procedure Laterality Date   ANTERIOR CERVICAL DECOMP/DISCECTOMY FUSION N/A 04/14/2020   Procedure: ANTERIOR CERVICAL DECOMPRESSION/DISCECTOMY FUSION, INTERBODY PROSTHESIS, PLATE/SCREWS CERVICAL THREE-FOUR,CERVICAL FOUR-FIVE,CERVICAL SIX-SEVEN;  Surgeon: Tressie Stalker, MD;  Location: [facility] OR;  Service: Neurosurgery;  Laterality: N/A;  anterior   BICEPT TENODESIS Left 01/04/2023   Procedure: LEFT BICEPS TENODESIS;  Surgeon: Cammy Copa, MD;  Location: [facility] ORS;  Service: Orthopedics;  Laterality: Left;   CERVICAL SPINE SURGERY N/A 1990   C5-6 discectomy plates and screws in neck per pt   colonscopy  2017   polyps removed per pt follow up in 5 years   FINGER SURGERY Right 2008   x 3 index finger   KNEE ARTHROSCOPY Bilateral    L 2004, R 2005   MASS EXCISION Right 01/18/2017   Procedure: EXCISION BENIGN RIGHT NECK MASS;  Surgeon: Drema Halon, MD;  Location: [facility];  Service: ENT;  Laterality:  Right;   MASS EXCISION Bilateral 03/23/2019   Procedure: EXCISIONS OF RIGHT CHEEK LESION, RIGHT POSTERIOR NECK LIPOMA  AND LEFT NECK NODULE;  Surgeon: Drema Halon, MD;  Location: [facility];  Service: ENT;  Laterality: Bilateral;   NASAL FRACTURE SURGERY  1980   REVERSE SHOULDER ARTHROPLASTY Left 01/04/2023   Procedure: LEFT REVERSE SHOULDER ARTHROPLASTY;  Surgeon: Cammy Copa, MD;  Location: [facility];  Service: Orthopedics;  Laterality: Left;   TOTAL HIP ARTHROPLASTY Left 12/18/2021   Procedure: LEFT TOTAL HIP ARTHROPLASTY ANTERIOR APPROACH;  Surgeon: Kathryne Hitch, MD;  Location: [facility];  Service: Orthopedics;  Laterality: Left;   TOTAL KNEE ARTHROPLASTY Right 06/01/2016   Procedure: TOTAL KNEE ARTHROPLASTY;  Surgeon: Valeria Batman, MD;  Location: [facility] OR;  Service: Orthopedics;  Laterality: Right;   TOTAL KNEE ARTHROPLASTY Left 07/25/2018   Procedure: LEFT TOTAL KNEE ARTHROPLASTY;  Surgeon: Valeria Batman, MD;  Location: [facility] OR;  Service: Orthopedics;  Laterality: Left;   ULNAR NERVE TRANSPOSITION Left 11/25/2020   Procedure: ulnar nerve decompression left elbow;  Surgeon: Valeria Batman, MD;  Location: [facility] OR;  Service: Orthopedics;  Laterality: Left;   Patient Active Problem List   Diagnosis Date Noted   Left rotator cuff tear arthropathy 01/08/2023   Biceps  tendonitis on left 01/08/2023   S/P reverse total shoulder arthroplasty, left 01/04/2023   Status post total replacement of left hip 12/18/2021   Pain in left hip 09/03/2021   Neuropathy, 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   Cervical spondylosis with myelopathy and radiculopathy 04/14/2020   Impingement syndrome of right shoulder 10/10/2019   Low back pain 10/10/2019   Irritation of ulnar nerve, left 08/28/2019   Class 2 obesity due to excess calories without serious comorbidity with body mass index (BMI) of 39.0 to 39.9 in adult 01/08/2019   Pain in right elbow 12/06/2018   Primary  osteoarthritis of left knee 07/25/2018   History of left knee replacement 07/25/2018   Right foot pain 06/22/2018   Acute left-sided low back pain with left-sided sciatica 06/22/2018   Osteoarthritis 11/21/2017   Hyperlipidemia 11/21/2017   Low testosterone 09/26/2016   Healthcare maintenance 09/26/2016   S/P total knee replacement using cement, right 06/01/2016   Sleep apnea 05/19/2016   Hypertension 05/19/2016    PCP: Lavada Mesi MD  REFERRING PROVIDER: Cammy Copa, MD  REFERRING DIAG: [id number] (ICD-10-CM) - S/P reverse total shoulder arthroplasty, left  THERAPY DIAG:  Chronic left shoulder pain  Muscle weakness (generalized)  Abnormal posture  Localized edema  Rationale for Evaluation and Treatment: Rehabilitation  ONSET DATE: surgery 01/04/2023  SUBJECTIVE:                                                                                                                                                                                      SUBJECTIVE STATEMENT: Pt indicated he was doing better after last visit.  Reported doing a quick reach while cooking that caused some similar biceps front of arm symptoms.   PERTINENT HISTORY: Previous C5-6 discectomy, presenting for C3-4, C4-5, C6-7 ACDF due to pain. PMH includes: arthritis, HTN, PNA, sleep apnea.   PAIN:  NPRS scale: 1-2/10 current, at worst 4-5/10 Pain location: Lt shoulder  Pain description: ache, sore  Aggravating factors: after CPM use Relieving factors: ice  PRECAUTIONS: Avoid active IR strengthening, ER past 30 deg 4-6 weeks  WEIGHT BEARING RESTRICTIONS: No  FALLS:  Has patient fallen in last 6 months? 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  PLOF: Independent, Rt handed.  Reading, yard work (limited). Walking.   PATIENT GOALS:Reduce pain, get range better.   OBJECTIVE:   PATIENT  SURVEYS:    02/15/2023:  FOTO update:   59  01/20/2023 FOTO intake:   50  predicted:  65  COGNITION: 01/20/2023 Overall cognitive status: WFL     SENSATION: 01/20/2023  WFL  POSTURE: 01/20/2023 Rounded shoulders   Localized edema Lt shoulder post surgery.   UPPER EXTREMITY ROM:   ROM Right 01/20/2023 Left 01/20/2023 Left 01/27/2023 Left 02/15/2023 AROM in supine Left 02/24/2023  Shoulder flexion WFL 90 AROM in supine  120 PROM in supine 140 deg PROM in supine 138 AROM  145 AROM in supine  Shoulder extension       Shoulder abduction WFL 90 PROM in supine   118 AROM   Shoulder adduction       Shoulder internal rotation       Shoulder external rotation  PROM 10 deg  in 30 deg abduction   35 in 45 deg abduction supine   Elbow flexion       Elbow extension       Wrist flexion       Wrist extension       Wrist ulnar deviation       Wrist radial deviation       Wrist pronation       Wrist supination       (Blank rows = not tested)  UPPER EXTREMITY MMT:  MMT Right 01/20/2023 Left 01/20/2023  Held shoulder based MMT due to surgery protocol Left 02/07/2023 Left 02/15/2023 Left 03/04/2023  Shoulder flexion 5/5   3+/5 4/5 c pain  Shoulder extension       Shoulder abduction 5/5    4+/5  Shoulder adduction       Shoulder internal rotation 5/5      Shoulder external rotation 5/5    3+/5  Middle trapezius       Lower trapezius       Elbow flexion 5/5 5/5 4/5    Elbow extension 5/5 5/5     Wrist flexion       Wrist extension       Wrist ulnar deviation       Wrist radial deviation       Wrist pronation       Wrist supination       Grip strength (lbs)       (Blank rows = not tested)  SHOULDER SPECIAL TESTS: 01/20/2023 (+) Shrug noted in active Lt shoulder flexion attempt vs. Gravity.   JOINT MOBILITY TESTING:  01/20/2023 Guarding noted but not specific joint mobility noted.   PALPATION:  01/20/2023 General tenderness around Lt shoulder.  TODAY'S TREATMENT:                                                                                                       DATE: 03/07/2023 Therex: UBE fwd/back UE only lvl 3.0 4 mins each way Standing high blue band attachment on wall for GH ext 2 x 15  Standing green band ER isometric walk out holds with towel under arm Lt 5 sec hold x 15  Machine chest press double arm with SA press hold 2 seconds 10 lbs 2 x 15  Machine rows 20 lbs 2 x 15   Standing eccentric only flexion lowering Lt arm x15 from 120 deg to 0 1 lb  Standing Lt shoulder abduction x 15 1 lb    TODAY'S TREATMENT:                                                                                                       DATE: 03/04/2023 Therex: UBE fwd/back UE only lvl 3.0 4 mins each way Machine chest press double arm with SA press hold 2 seconds 10 lbs 2 x 15  Supine Lt arm chest press with SA hold with dumbbell 3 lbs x 15  Supine 90 deg flexion circles clockwise, counterclockwise 3 lbs  2 x 20 each way Standing eccentric only flexion lowering Lt arm 2 x 10 from 90 deg to 0  Standing Lt shoulder abduction x 10  Standing high blue band attachment on wall for GH ext 2 x 10   TODAY'S TREATMENT:                                                                                                       DATE: 02/23/2023 Therex: UBE fwd/back UE only lvl 2.5 4 mins each way Shoulder isometrics with arm at side - performed in flexion, abduction, ER 5 sec on/off for 12 each way (added to HEP) Supine AROM flexion 1lb 2 x 15  Sidelying Lt shoulder Abduction 1 lb 2 x 15 Wall slides flexion 5 sec hold x 10  Wall push up with SA press 2 sec hold x 15  TODAY'S TREATMENT:  DATE:  02/15/2023 Therex: Pulley flexion, scaption 3 mins each way AAROM Supine AROM 1lb 2 x 15  Sidelying Lt shoulder Abduction 1 lb 2 x 15 Sidelying Lt shoulder ER c towel under arm 1 lb 2 x 15  Standing blue band rows, gh ext x 20 each  Seated lat pull down blue band 2 x 15  Small red pball flexion rolls up wall x 15 AAROM  PATIENT EDUCATION: 01/20/2023 Education details: HEP update  Person educated: Patient Education method: Programmer, multimedia, Facilities manager, Verbal cues, and Handouts Education comprehension: verbalized understanding, returned demonstration, and verbal cues required  HOME EXERCISE PROGRAM: Access Code: [email or web address] URL: [email or web address] Date: 02/24/2023 Prepared by: Chyrel Masson  Exercises - Supine Shoulder Flexion Extension AAROM with Dowel  - 1-2 x daily - 7 x weekly - 1-2 sets - 10-15 reps - 3 hold - Seated Scapular Retraction  - 3-5 x daily - 7 x weekly - 1 sets - 10 reps - 3-5 hold - Standing Shoulder Row with Anchored Resistance  - 1-2 x daily - 7 x weekly - 2 sets - 10 reps - 5 sec hold - Shoulder extension with resistance - Neutral  - 1-2 x daily - 7 x weekly - 2 sets - 10 reps - 5 sec hold - Supine Shoulder Flexion Extension Full Range AROM (Mirrored)  - 1-2 x daily - 7 x weekly - 2-3 sets - 10-15 reps - Sidelying Shoulder Abduction Palm Forward (Mirrored)  - 1-2 x daily - 7 x weekly - 2-3 sets - 10-15 reps - Sidelying Shoulder External Rotation  - 1-2 x daily - 7 x weekly - 2-3 sets - 10-15 reps - Isometric Shoulder Flexion at Wall  - 1-2 x daily - 7 x weekly - 1 sets - 10 reps - 5 hold - Standing Isometric Shoulder Abduction with Doorway - Arm Bent  - 1-2 x daily - 7 x weekly - 1 sets - 10 reps - 5 hold - Standing Isometric Shoulder External Rotation with Doorway  - 1-2 x daily - 7 x weekly - 1 sets - 10 reps - 5 hold  ASSESSMENT:  CLINICAL IMPRESSION: No adverse limitations in activity due to complaints noted in anterior arm from reaching quickly  when cooking.  Continued focus on progressive strengthening for Lt arm elevation improvements.    OBJECTIVE IMPAIRMENTS: decreased activity tolerance, decreased coordination, decreased endurance, decreased mobility, decreased ROM, decreased strength, increased edema, increased fascial restrictions, impaired perceived functional ability, impaired flexibility, impaired UE functional use, postural dysfunction, and pain.   ACTIVITY LIMITATIONS: carrying, lifting, sleeping, bed mobility, bathing, dressing, self feeding, reach over head, and hygiene/grooming  PARTICIPATION LIMITATIONS: meal prep, cleaning, laundry, interpersonal relationship, driving, community activity, occupation, and yard work  PERSONAL FACTORS:  No specific limitations  are also affecting patient's functional outcome.   REHAB POTENTIAL: Good  CLINICAL DECISION MAKING: Stable/uncomplicated  EVALUATION COMPLEXITY: Low   GOALS: Goals reviewed with patient? Yes  SHORT TERM GOALS: (target date for Short term goals are 3 weeks 02/10/2023)  1.Patient will demonstrate independent use of home exercise program to maintain progress from in clinic treatments. Goal status: Met   LONG TERM GOALS: (target dates for all long term goals are 10 weeks  03/31/2023 )   1. Patient will demonstrate/report pain at worst less than or equal to 2/10 to facilitate minimal limitation in daily activity secondary to pain symptoms. Goal status: on going 02/15/2023   2. Patient will demonstrate independent use of home exercise program to  facilitate ability to maintain/progress functional gains from skilled physical therapy services. Goal status: on going 02/15/2023   3. Patient will demonstrate FOTO outcome > or = 65 % to indicate reduced disability due to condition. Goal status: on going 02/15/2023   4.  Patient will demonstrate Lt UE MMT 4/5 or greater  throughout to facilitate lifting, reaching, carrying at [facility] in daily activity.  Goal status: on  going 02/15/2023   5.  Patient will demonstrate Lt  [facility] joint AROM WFL s symptoms to facilitate usual overhead reaching, self care, dressing at PLOF.   Goal status: on going 02/15/2023   6.  Patient will demonstrate/report ability to perform routine yard work at [employer] s limitation.  Goal status: on going 02/15/2023   PLAN:  PT FREQUENCY: 1-2x/week (start at 2)  PT DURATION: 10 weeks  PLANNED INTERVENTIONS: Therapeutic exercises, Therapeutic activity, Neuro Muscular re-education, Balance training, Gait training, Patient/Family education, Joint mobilization, Stair training, DME instructions, Dry Needling, Electrical stimulation, Traction, Cryotherapy, vasopneumatic device Moist heat, Taping, Ultrasound, Ionotophoresis 4mg /ml Dexamethasone, and aquatic therapy ,Manual therapy.  All included unless contraindicated  PLAN FOR NEXT SESSION:  Continue reassessment/adjustment to anterior symptoms if present.  Strengthening for elevation improvements.    Chyrel Masson, PT, DPT, OCS, ATC 03/07/23  9:16 AM

## 2023-03-08 ENCOUNTER — Other Ambulatory Visit: Payer: Self-pay | Admitting: Pharmacist

## 2023-03-08 NOTE — Progress Notes (Signed)
03/08/2023  Patient ID: WILMORE NETHERY, male   DOB: 1950-08-01, 72 y.o.   MRN: [id number]  Pharmacy Quality Measure Review  This patient is appearing on a report for being at risk of failing the adherence measure for hypertension (ACEi/ARB) medications this calendar year.   Medication: Losartan 100mg  Last fill date: 09/29/22 for 90 day supply  *Says not taking on 12/21/22 due to patient preference.  Spoke to the patient on the phone and he reports he stopped it prior to his surgery in July and was never told to restart. Says BP is 140's systolic at home. Advised goal is 120-130's systolic. Reports he will re-start medication if recommended by Dr. Prince Rome office.  Called Dr. Prince Rome office and notified of potential need for patient to restart medication. Thanked for the information and said they would decide with the patient.    Marlowe Aschoff, PharmD [facility] Phone Number: [phone]

## 2023-03-10 ENCOUNTER — Encounter: Payer: Self-pay | Admitting: Rehabilitative and Restorative Service Providers"

## 2023-03-10 ENCOUNTER — Ambulatory Visit: Payer: PPO | Admitting: Rehabilitative and Restorative Service Providers"

## 2023-03-10 DIAGNOSIS — M25512 Pain in left shoulder: Secondary | ICD-10-CM | POA: Diagnosis not present

## 2023-03-10 DIAGNOSIS — G8929 Other chronic pain: Secondary | ICD-10-CM | POA: Diagnosis not present

## 2023-03-10 DIAGNOSIS — R600 Localized edema: Secondary | ICD-10-CM

## 2023-03-10 DIAGNOSIS — M6281 Muscle weakness (generalized): Secondary | ICD-10-CM

## 2023-03-10 DIAGNOSIS — R293 Abnormal posture: Secondary | ICD-10-CM | POA: Diagnosis not present

## 2023-03-10 NOTE — Therapy (Signed)
OUTPATIENT PHYSICAL THERAPY TREATMENT   Patient Name: James Gallegos MRN: [id number] DOB:1951-05-21, 72 y.o., male Today's Date: 03/10/2023    END OF SESSION:  PT End of Session - 03/10/23 1151     Visit Number 11    Number of Visits 20    Date for PT Re-Evaluation 03/31/23    Authorization Type [employer]    PT Start Time 1142    PT Stop Time 1210    PT Time Calculation (min) 28 min    Activity Tolerance Patient limited by pain    Behavior During Therapy [facility] for tasks assessed/performed                 Past Medical History:  Diagnosis Date   Arthritis    back , R foot -   Deep vein thrombosis (HCC)    after knee surgery in 2004   Diabetes mellitus without complication (HCC)    Patients not aware of being dx   Hypertension    Murmur, cardiac    pt. reports its difficult to auscultate    Sleep apnea    CPAP- last study- 2014 cpap set on 13 per pt   Wears glasses 11/20/2020   for reading   Past Surgical History:  Procedure Laterality Date   ANTERIOR CERVICAL DECOMP/DISCECTOMY FUSION N/A 04/14/2020   Procedure: ANTERIOR CERVICAL DECOMPRESSION/DISCECTOMY FUSION, INTERBODY PROSTHESIS, PLATE/SCREWS CERVICAL THREE-FOUR,CERVICAL FOUR-FIVE,CERVICAL SIX-SEVEN;  Surgeon: Tressie Stalker, MD;  Location: [facility] OR;  Service: Neurosurgery;  Laterality: N/A;  anterior   BICEPT TENODESIS Left 01/04/2023   Procedure: LEFT BICEPS TENODESIS;  Surgeon: Cammy Copa, MD;  Location: [facility] ORS;  Service: Orthopedics;  Laterality: Left;   CERVICAL SPINE SURGERY N/A 1990   C5-6 discectomy plates and screws in neck per pt   colonscopy  2017   polyps removed per pt follow up in 5 years   FINGER SURGERY Right 2008   x 3 index finger   KNEE ARTHROSCOPY Bilateral    L 2004, R 2005   MASS EXCISION Right 01/18/2017   Procedure: EXCISION BENIGN RIGHT NECK MASS;  Surgeon: Drema Halon, MD;  Location: [facility];  Service: ENT;  Laterality: Right;    MASS EXCISION Bilateral 03/23/2019   Procedure: EXCISIONS OF RIGHT CHEEK LESION, RIGHT POSTERIOR NECK LIPOMA  AND LEFT NECK NODULE;  Surgeon: Drema Halon, MD;  Location: [facility];  Service: ENT;  Laterality: Bilateral;   NASAL FRACTURE SURGERY  1980   REVERSE SHOULDER ARTHROPLASTY Left 01/04/2023   Procedure: LEFT REVERSE SHOULDER ARTHROPLASTY;  Surgeon: Cammy Copa, MD;  Location: [facility];  Service: Orthopedics;  Laterality: Left;   TOTAL HIP ARTHROPLASTY Left 12/18/2021   Procedure: LEFT TOTAL HIP ARTHROPLASTY ANTERIOR APPROACH;  Surgeon: Kathryne Hitch, MD;  Location: [facility];  Service: Orthopedics;  Laterality: Left;   TOTAL KNEE ARTHROPLASTY Right 06/01/2016   Procedure: TOTAL KNEE ARTHROPLASTY;  Surgeon: Valeria Batman, MD;  Location: [facility] OR;  Service: Orthopedics;  Laterality: Right;   TOTAL KNEE ARTHROPLASTY Left 07/25/2018   Procedure: LEFT TOTAL KNEE ARTHROPLASTY;  Surgeon: Valeria Batman, MD;  Location: [facility] OR;  Service: Orthopedics;  Laterality: Left;   ULNAR NERVE TRANSPOSITION Left 11/25/2020   Procedure: ulnar nerve decompression left elbow;  Surgeon: Valeria Batman, MD;  Location: [facility] OR;  Service: Orthopedics;  Laterality: Left;   Patient Active Problem List   Diagnosis Date Noted   Left rotator cuff tear arthropathy 01/08/2023  Biceps tendonitis on left 01/08/2023   S/P reverse total shoulder arthroplasty, left 01/04/2023   Status post total replacement of left hip 12/18/2021   Pain in left hip 09/03/2021   Neuropathy, 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   Cervical spondylosis with myelopathy and radiculopathy 04/14/2020   Impingement syndrome of right shoulder 10/10/2019   Low back pain 10/10/2019   Irritation of ulnar nerve, left 08/28/2019   Class 2 obesity due to excess calories without serious comorbidity with body mass index (BMI) of 39.0 to 39.9 in adult 01/08/2019   Pain in right elbow 12/06/2018   Primary osteoarthritis of left  knee 07/25/2018   History of left knee replacement 07/25/2018   Right foot pain 06/22/2018   Acute left-sided low back pain with left-sided sciatica 06/22/2018   Osteoarthritis 11/21/2017   Hyperlipidemia 11/21/2017   Low testosterone 09/26/2016   Healthcare maintenance 09/26/2016   S/P total knee replacement using cement, right 06/01/2016   Sleep apnea 05/19/2016   Hypertension 05/19/2016    PCP: Lavada Mesi MD  REFERRING PROVIDER: Cammy Copa, MD  REFERRING DIAG: [phone] (ICD-10-CM) - S/P reverse total shoulder arthroplasty, left  THERAPY DIAG:  Chronic left shoulder pain  Muscle weakness (generalized)  Abnormal posture  Localized edema  Rationale for Evaluation and Treatment: Rehabilitation  ONSET DATE: surgery 01/04/2023  SUBJECTIVE:                                                                                                                                                                                      SUBJECTIVE STATEMENT: Pt indicated continued complaints in biceps and into shoulder since last visit.  Noticed having trouble increase with some of the exercises in lifting.   PERTINENT HISTORY: Previous C5-6 discectomy, presenting for C3-4, C4-5, C6-7 ACDF due to pain. PMH includes: arthritis, HTN, PNA, sleep apnea.   PAIN:  NPRS scale: 1-2/10 current Pain location: Lt shoulder  Pain description: ache, sore  Aggravating factors: after CPM use Relieving factors: ice  PRECAUTIONS: Avoid active IR strengthening, ER past 30 deg 4-6 weeks  WEIGHT BEARING RESTRICTIONS: No  FALLS:  Has patient fallen in last 6 months? 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  PLOF: Independent, Rt handed.  Reading, yard work (limited). Walking.   PATIENT GOALS:Reduce pain, get range better.   OBJECTIVE:   PATIENT SURVEYS:    02/15/2023:  FOTO update:    59  01/20/2023 FOTO intake:   50  predicted:  65  COGNITION: 01/20/2023 Overall cognitive status: WFL     SENSATION: 01/20/2023 WFL  POSTURE: 01/20/2023  Rounded shoulders   Localized edema Lt shoulder post surgery.   UPPER EXTREMITY ROM:   ROM Right 01/20/2023 Left 01/20/2023 Left 01/27/2023 Left 02/15/2023 AROM in supine Left 02/24/2023  Shoulder flexion WFL 90 AROM in supine  120 PROM in supine 140 deg PROM in supine 138 AROM  145 AROM in supine  Shoulder extension       Shoulder abduction WFL 90 PROM in supine   118 AROM   Shoulder adduction       Shoulder internal rotation       Shoulder external rotation  PROM 10 deg  in 30 deg abduction   35 in 45 deg abduction supine   Elbow flexion       Elbow extension       Wrist flexion       Wrist extension       Wrist ulnar deviation       Wrist radial deviation       Wrist pronation       Wrist supination       (Blank rows = not tested)  UPPER EXTREMITY MMT:  MMT Right 01/20/2023 Left 01/20/2023  Held shoulder based MMT due to surgery protocol Left 02/07/2023 Left 02/15/2023 Left 03/04/2023 Left 03/10/2023  Shoulder flexion 5/5   3+/5 4/5 c pain   Shoulder extension        Shoulder abduction 5/5    4+/5   Shoulder adduction        Shoulder internal rotation 5/5       Shoulder external rotation 5/5    3+/5   Middle trapezius        Lower trapezius        Elbow flexion 5/5 5/5 4/5   5/5 without pain  Elbow extension 5/5 5/5      Wrist flexion        Wrist extension        Wrist ulnar deviation        Wrist radial deviation        Wrist pronation        Wrist supination      4+/5 without pain  Grip strength (lbs)        (Blank rows = not tested)  SHOULDER SPECIAL TESTS: 01/20/2023 (+) Shrug noted in active Lt shoulder flexion attempt vs. Gravity.   JOINT MOBILITY TESTING:  01/20/2023 Guarding noted but not specific joint mobility noted.   PALPATION:  01/20/2023 General tenderness around Lt shoulder.  TODAY'S TREATMENT:                                                                                                       DATE:  03/10/2023 Therex: UBE fwd/back UE only lvl 3.0 4 mins each way Cues given to reduced frequency of strengthening intervention with every other day variation in lying down and standing elevation strengthening to reduced impact on symptom area.    Manual Cross friction to proximal biceps and anterior joint c frozen tool for cryotherapy aspect.  Barrier of massage cream used in area during use.   TODAY'S TREATMENT:                                                                                                       DATE: 03/07/2023 Therex: UBE fwd/back UE only lvl 3.0 4 mins each way Standing high blue band attachment on wall for GH ext 2 x 15  Standing green band ER isometric walk out holds with towel under arm Lt 5 sec hold x 15  Machine chest press double arm with SA press hold 2 seconds 10 lbs 2 x 15  Machine rows 20 lbs 2 x 15   Standing eccentric only flexion lowering Lt arm x15 from 120 deg to 0 1 lb  Standing Lt shoulder abduction x 15 1 lb    TODAY'S TREATMENT:                                                                                                       DATE: 03/04/2023 Therex: UBE fwd/back UE only lvl 3.0 4 mins each way Machine chest press double arm with SA press hold 2 seconds 10 lbs 2 x 15  Supine Lt arm chest press with SA hold with dumbbell 3 lbs x 15  Supine 90 deg flexion circles clockwise, counterclockwise 3 lbs  2 x 20 each way Standing eccentric only flexion lowering Lt arm 2 x 10 from 90 deg to 0  Standing Lt shoulder abduction x 10  Standing high blue band attachment on wall for GH ext 2 x 10   TODAY'S TREATMENT:  DATE: 02/23/2023 Therex: UBE fwd/back UE only lvl 2.5 4 mins each way Shoulder isometrics with arm at side - performed in flexion, abduction, ER 5 sec on/off for 12 each way (added to HEP) Supine AROM flexion 1lb 2 x 15  Sidelying Lt shoulder Abduction 1 lb 2 x 15 Wall slides flexion 5 sec hold x 10  Wall push up with SA press 2 sec hold x 15   PATIENT EDUCATION: 01/20/2023 Education details: HEP update  Person educated: Patient Education method: Programmer, multimedia, Demonstration, Verbal cues, and Handouts Education comprehension: verbalized understanding, returned demonstration, and verbal cues required  HOME EXERCISE PROGRAM: Access Code: [email or web address] URL: [email or web address] Date: 02/24/2023 Prepared by: Chyrel Masson  Exercises - Supine Shoulder Flexion Extension AAROM with Dowel  - 1-2 x daily - 7 x weekly - 1-2 sets - 10-15 reps - 3 hold - Seated Scapular Retraction  - 3-5 x daily - 7 x weekly - 1 sets - 10 reps - 3-5 hold - Standing Shoulder Row with Anchored Resistance  - 1-2 x daily - 7 x weekly - 2 sets - 10 reps - 5 sec hold - Shoulder extension with resistance - Neutral  - 1-2 x daily - 7 x weekly - 2 sets - 10 reps - 5 sec hold - Supine Shoulder Flexion Extension Full Range AROM (Mirrored)  - 1-2 x daily - 7 x weekly - 2-3 sets - 10-15 reps - Sidelying Shoulder Abduction Palm Forward (Mirrored)  - 1-2 x daily - 7 x weekly - 2-3 sets - 10-15 reps - Sidelying Shoulder External Rotation  - 1-2 x daily - 7 x weekly - 2-3 sets - 10-15 reps - Isometric Shoulder Flexion at Wall  - 1-2 x daily - 7 x weekly - 1 sets - 10 reps - 5 hold - Standing Isometric Shoulder Abduction with Doorway - Arm Bent  - 1-2 x daily - 7 x weekly - 1 sets - 10 reps - 5 hold - Standing Isometric Shoulder External Rotation with Doorway  - 1-2 x daily - 7 x weekly - 1 sets - 10 reps - 5 hold  ASSESSMENT:  CLINICAL IMPRESSION: Recommended  reducing volume of interventions to allow recovery and hopefully reduced symptoms in arm following incident of reaching quickly to catch something that was reported.  Cues given to reduced frequency of strengthening intervention with every other day variation in lying down and standing elevation strengthening to reduced impact on symptom area.     OBJECTIVE IMPAIRMENTS: decreased activity tolerance, decreased coordination, decreased endurance, decreased mobility, decreased ROM, decreased strength, increased edema, increased fascial restrictions, impaired perceived functional ability, impaired flexibility, impaired UE functional use, postural dysfunction, and pain.   ACTIVITY LIMITATIONS: carrying, lifting, sleeping, bed mobility, bathing, dressing, self feeding, reach over head, and hygiene/grooming  PARTICIPATION LIMITATIONS: meal prep, cleaning, laundry, interpersonal relationship, driving, community activity, occupation, and yard work  PERSONAL FACTORS:  No specific limitations  are also affecting patient's functional outcome.   REHAB POTENTIAL: Good  CLINICAL DECISION MAKING: Stable/uncomplicated  EVALUATION COMPLEXITY: Low   GOALS: Goals reviewed with patient? Yes  SHORT TERM GOALS: (target date for Short term goals are 3 weeks 02/10/2023)  1.Patient will demonstrate independent use of home exercise program to maintain progress from in clinic treatments. Goal status: Met   LONG TERM GOALS: (target dates for all long term goals are 10 weeks  03/31/2023 )   1. Patient will demonstrate/report pain at worst less than or equal to 2/10 to facilitate minimal  limitation in daily activity secondary to pain symptoms. Goal status: on going 02/15/2023   2. Patient will demonstrate independent use of home exercise program to facilitate ability to maintain/progress functional gains from skilled physical therapy services. Goal status: on going 02/15/2023   3. Patient will demonstrate FOTO outcome  > or = 65 % to indicate reduced disability due to condition. Goal status: on going 02/15/2023   4.  Patient will demonstrate Lt UE MMT 4/5 or greater  throughout to facilitate lifting, reaching, carrying at [facility] in daily activity.  Goal status: on going 02/15/2023   5.  Patient will demonstrate Lt  [facility] joint AROM WFL s symptoms to facilitate usual overhead reaching, self care, dressing at PLOF.   Goal status: on going 02/15/2023   6.  Patient will demonstrate/report ability to perform routine yard work at [employer] s limitation.  Goal status: on going 02/15/2023   PLAN:  PT FREQUENCY: 1-2x/week (start at 2)  PT DURATION: 10 weeks  PLANNED INTERVENTIONS: Therapeutic exercises, Therapeutic activity, Neuro Muscular re-education, Balance training, Gait training, Patient/Family education, Joint mobilization, Stair training, DME instructions, Dry Needling, Electrical stimulation, Traction, Cryotherapy, vasopneumatic device Moist heat, Taping, Ultrasound, Ionotophoresis 4mg /ml Dexamethasone, and aquatic therapy ,Manual therapy.  All included unless contraindicated  PLAN FOR NEXT SESSION:  Plan to recheck symptoms, general ROM/strength reassessment.    Chyrel Masson, PT, DPT, OCS, ATC 03/10/23  12:16 PM

## 2023-03-14 ENCOUNTER — Ambulatory Visit: Payer: PPO | Admitting: Physical Therapy

## 2023-03-14 ENCOUNTER — Encounter: Payer: Self-pay | Admitting: Physical Therapy

## 2023-03-14 DIAGNOSIS — R293 Abnormal posture: Secondary | ICD-10-CM | POA: Diagnosis not present

## 2023-03-14 DIAGNOSIS — R600 Localized edema: Secondary | ICD-10-CM | POA: Diagnosis not present

## 2023-03-14 DIAGNOSIS — G8929 Other chronic pain: Secondary | ICD-10-CM | POA: Diagnosis not present

## 2023-03-14 DIAGNOSIS — M6281 Muscle weakness (generalized): Secondary | ICD-10-CM

## 2023-03-14 DIAGNOSIS — M25512 Pain in left shoulder: Secondary | ICD-10-CM | POA: Diagnosis not present

## 2023-03-14 NOTE — Therapy (Signed)
OUTPATIENT PHYSICAL THERAPY TREATMENT   Patient Name: James Gallegos MRN: [id number] DOB:02-Feb-1951, 72 y.o., male Today's Date: 03/14/2023    END OF SESSION:  PT End of Session - 03/14/23 0802     Visit Number 12    Number of Visits 20    Date for PT Re-Evaluation 03/31/23    Authorization Type [employer]    PT Start Time 0803    PT Stop Time 0847    PT Time Calculation (min) 44 min    Activity Tolerance Patient limited by pain    Behavior During Therapy [facility] for tasks assessed/performed                  Past Medical History:  Diagnosis Date   Arthritis    back , R foot -   Deep vein thrombosis (HCC)    after knee surgery in 2004   Diabetes mellitus without complication (HCC)    Patients not aware of being dx   Hypertension    Murmur, cardiac    pt. reports its difficult to auscultate    Sleep apnea    CPAP- last study- 2014 cpap set on 13 per pt   Wears glasses 11/20/2020   for reading   Past Surgical History:  Procedure Laterality Date   ANTERIOR CERVICAL DECOMP/DISCECTOMY FUSION N/A 04/14/2020   Procedure: ANTERIOR CERVICAL DECOMPRESSION/DISCECTOMY FUSION, INTERBODY PROSTHESIS, PLATE/SCREWS CERVICAL THREE-FOUR,CERVICAL FOUR-FIVE,CERVICAL SIX-SEVEN;  Surgeon: Tressie Stalker, MD;  Location: [facility] OR;  Service: Neurosurgery;  Laterality: N/A;  anterior   BICEPT TENODESIS Left 01/04/2023   Procedure: LEFT BICEPS TENODESIS;  Surgeon: Cammy Copa, MD;  Location: [facility] ORS;  Service: Orthopedics;  Laterality: Left;   CERVICAL SPINE SURGERY N/A 1990   C5-6 discectomy plates and screws in neck per pt   colonscopy  2017   polyps removed per pt follow up in 5 years   FINGER SURGERY Right 2008   x 3 index finger   KNEE ARTHROSCOPY Bilateral    L 2004, R 2005   MASS EXCISION Right 01/18/2017   Procedure: EXCISION BENIGN RIGHT NECK MASS;  Surgeon: Drema Halon, MD;  Location: [facility];  Service: ENT;  Laterality: Right;    MASS EXCISION Bilateral 03/23/2019   Procedure: EXCISIONS OF RIGHT CHEEK LESION, RIGHT POSTERIOR NECK LIPOMA  AND LEFT NECK NODULE;  Surgeon: Drema Halon, MD;  Location: [facility];  Service: ENT;  Laterality: Bilateral;   NASAL FRACTURE SURGERY  1980   REVERSE SHOULDER ARTHROPLASTY Left 01/04/2023   Procedure: LEFT REVERSE SHOULDER ARTHROPLASTY;  Surgeon: Cammy Copa, MD;  Location: [facility];  Service: Orthopedics;  Laterality: Left;   TOTAL HIP ARTHROPLASTY Left 12/18/2021   Procedure: LEFT TOTAL HIP ARTHROPLASTY ANTERIOR APPROACH;  Surgeon: Kathryne Hitch, MD;  Location: [facility];  Service: Orthopedics;  Laterality: Left;   TOTAL KNEE ARTHROPLASTY Right 06/01/2016   Procedure: TOTAL KNEE ARTHROPLASTY;  Surgeon: Valeria Batman, MD;  Location: [facility] OR;  Service: Orthopedics;  Laterality: Right;   TOTAL KNEE ARTHROPLASTY Left 07/25/2018   Procedure: LEFT TOTAL KNEE ARTHROPLASTY;  Surgeon: Valeria Batman, MD;  Location: [facility] OR;  Service: Orthopedics;  Laterality: Left;   ULNAR NERVE TRANSPOSITION Left 11/25/2020   Procedure: ulnar nerve decompression left elbow;  Surgeon: Valeria Batman, MD;  Location: [facility] OR;  Service: Orthopedics;  Laterality: Left;   Patient Active Problem List   Diagnosis Date Noted   Left rotator cuff tear arthropathy 01/08/2023  Biceps tendonitis on left 01/08/2023   S/P reverse total shoulder arthroplasty, left 01/04/2023   Status post total replacement of left hip 12/18/2021   Pain in left hip 09/03/2021   Neuropathy, 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   Cervical spondylosis with myelopathy and radiculopathy 04/14/2020   Impingement syndrome of right shoulder 10/10/2019   Low back pain 10/10/2019   Irritation of ulnar nerve, left 08/28/2019   Class 2 obesity due to excess calories without serious comorbidity with body mass index (BMI) of 39.0 to 39.9 in adult 01/08/2019   Pain in right elbow 12/06/2018   Primary osteoarthritis of left  knee 07/25/2018   History of left knee replacement 07/25/2018   Right foot pain 06/22/2018   Acute left-sided low back pain with left-sided sciatica 06/22/2018   Osteoarthritis 11/21/2017   Hyperlipidemia 11/21/2017   Low testosterone 09/26/2016   Healthcare maintenance 09/26/2016   S/P total knee replacement using cement, right 06/01/2016   Sleep apnea 05/19/2016   Hypertension 05/19/2016    PCP: Lavada Mesi MD  REFERRING PROVIDER: Cammy Copa, MD  REFERRING DIAG: [phone] (ICD-10-CM) - S/P reverse total shoulder arthroplasty, left  THERAPY DIAG:  Chronic left shoulder pain  Muscle weakness (generalized)  Abnormal posture  Localized edema  Rationale for Evaluation and Treatment: Rehabilitation  ONSET DATE: surgery 01/04/2023  SUBJECTIVE:                                                                                                                                                                                      SUBJECTIVE STATEMENT: Shoulder is better than last visit; no pain currently   PERTINENT HISTORY: Previous C5-6 discectomy, presenting for C3-4, C4-5, C6-7 ACDF due to pain. PMH includes: arthritis, HTN, PNA, sleep apnea.   PAIN:  NPRS scale: 1-2/10 current Pain location: Lt shoulder  Pain description: ache, sore  Aggravating factors: after CPM use Relieving factors: ice  PRECAUTIONS: Avoid active IR strengthening, ER past 30 deg 4-6 weeks  WEIGHT BEARING RESTRICTIONS: No  FALLS:  Has patient fallen in last 6 months? 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  PLOF: Independent, Rt handed.  Reading, yard work (limited). Walking.   PATIENT GOALS:Reduce pain, get range better.   OBJECTIVE:   PATIENT SURVEYS:    02/15/2023:  FOTO update:   57  01/20/2023 FOTO intake:   50  predicted:  63  COGNITION: 01/20/2023 Overall cognitive  status: WFL     SENSATION: 01/20/2023 WFL  POSTURE: 01/20/2023 Rounded shoulders   Localized edema Lt shoulder post surgery.   UPPER EXTREMITY ROM:  ROM Right 01/20/2023 Left 01/20/2023 Left 01/27/2023 Left 02/15/2023 AROM in supine Left 02/24/2023  Shoulder flexion WFL 90 AROM in supine  120 PROM in supine 140 deg PROM in supine 138 AROM  145 AROM in supine  Shoulder extension       Shoulder abduction WFL 90 PROM in supine   118 AROM   Shoulder adduction       Shoulder internal rotation       Shoulder external rotation  PROM 10 deg  in 30 deg abduction   35 in 45 deg abduction supine   (Blank rows = not tested)  UPPER EXTREMITY MMT:  MMT Right 01/20/2023 Left 01/20/2023  Held shoulder based MMT due to surgery protocol Left 02/07/2023 Left 02/15/2023 Left 03/04/2023 Left 03/10/2023  Shoulder flexion 5/5   3+/5 4/5 c pain   Shoulder extension        Shoulder abduction 5/5    4+/5   Shoulder adduction        Shoulder internal rotation 5/5       Shoulder external rotation 5/5    3+/5   Middle trapezius        Lower trapezius        Elbow flexion 5/5 5/5 4/5   5/5 without pain  Elbow extension 5/5 5/5      Wrist flexion        Wrist extension        Wrist ulnar deviation        Wrist radial deviation        Wrist pronation        Wrist supination      4+/5 without pain  Grip strength (lbs)        (Blank rows = not tested)  SHOULDER SPECIAL TESTS: 01/20/2023 (+) Shrug noted in active Lt shoulder flexion attempt vs. Gravity.   JOINT MOBILITY TESTING:  01/20/2023 Guarding noted but not specific joint mobility noted.   PALPATION:  01/20/2023 General tenderness around Lt shoulder.                                                                                                                                                                                                  TODAY'S TREATMENT:  DATE:  03/14/2023 Therex: UBE L5 x 8 min (4 min each direction) BATCA rows 2x10; 20# Wall ladder flexion and scaption 2# wrist weight x 10 reps each Shoulder flexion with 2# to 90 deg x 10 reps  Manual STM with compression to Lt biceps; skilled palpation and monitoring of soft tissue during DN Trigger Point Dry-Needling  Treatment instructions: Expect mild to moderate muscle soreness. S/S of pneumothorax if dry needled over a lung field, and to seek immediate medical attention should they occur. Patient verbalized understanding of these instructions and education. Patient Consent Given: Yes Education handout provided: Yes Muscles treated: Lt biceps Electrical stimulation performed: Yes Parameters:  8 mA freq with intensity to tolerance x 5 min Treatment response/outcome: twitch responses noted   TODAY'S TREATMENT:                                                                                                       DATE:  03/10/2023 Therex: UBE fwd/back UE only lvl 3.0 4 mins each way Cues given to reduced frequency of strengthening intervention with every other day variation in lying down and standing elevation strengthening to reduced impact on symptom area.    Manual Cross friction to proximal biceps and anterior joint c frozen tool for cryotherapy aspect.  Barrier of massage cream used in area during use.   TODAY'S TREATMENT:                                                                                                       DATE: 03/07/2023 Therex: UBE fwd/back UE only lvl 3.0 4 mins each way Standing high blue band attachment on wall for GH ext 2 x 15  Standing green band ER isometric walk out holds with towel under arm Lt 5 sec hold x 15  Machine chest press double arm with SA press hold 2 seconds 10 lbs 2 x 15  Machine rows 20 lbs 2 x 15   Standing eccentric only flexion lowering Lt arm x15 from 120 deg to 0 1 lb  Standing Lt shoulder abduction x 15 1 lb     TODAY'S TREATMENT:  DATE: 03/04/2023 Therex: UBE fwd/back UE only lvl 3.0 4 mins each way Machine chest press double arm with SA press hold 2 seconds 10 lbs 2 x 15  Supine Lt arm chest press with SA hold with dumbbell 3 lbs x 15  Supine 90 deg flexion circles clockwise, counterclockwise 3 lbs  2 x 20 each way Standing eccentric only flexion lowering Lt arm 2 x 10 from 90 deg to 0  Standing Lt shoulder abduction x 10  Standing high blue band attachment on wall for GH ext 2 x 10   TODAY'S TREATMENT:                                                                                                       DATE: 02/23/2023 Therex: UBE fwd/back UE only lvl 2.5 4 mins each way Shoulder isometrics with arm at side - performed in flexion, abduction, ER 5 sec on/off for 12 each way (added to HEP) Supine AROM flexion 1lb 2 x 15  Sidelying Lt shoulder Abduction 1 lb 2 x 15 Wall slides flexion 5 sec hold x 10  Wall push up with SA press 2 sec hold x 15   PATIENT EDUCATION: 01/20/2023 Education details: HEP update  Person educated: Patient Education method: Programmer, multimedia, Demonstration, Verbal cues, and Handouts Education comprehension: verbalized understanding, returned demonstration, and verbal cues required  HOME EXERCISE PROGRAM: Access Code: [email or web address] URL: [email or web address] Date: 02/24/2023 Prepared by: Chyrel Masson  Exercises - Supine Shoulder Flexion Extension AAROM with Dowel  - 1-2 x daily - 7 x weekly - 1-2 sets - 10-15 reps - 3 hold - Seated Scapular Retraction  - 3-5 x daily - 7 x weekly - 1 sets - 10 reps - 3-5 hold - Standing Shoulder Row with Anchored Resistance  - 1-2 x daily - 7 x weekly - 2 sets - 10 reps - 5 sec hold - Shoulder extension with resistance - Neutral  - 1-2 x daily - 7 x weekly - 2 sets - 10 reps - 5 sec hold - Supine Shoulder Flexion Extension  Full Range AROM (Mirrored)  - 1-2 x daily - 7 x weekly - 2-3 sets - 10-15 reps - Sidelying Shoulder Abduction Palm Forward (Mirrored)  - 1-2 x daily - 7 x weekly - 2-3 sets - 10-15 reps - Sidelying Shoulder External Rotation  - 1-2 x daily - 7 x weekly - 2-3 sets - 10-15 reps - Isometric Shoulder Flexion at Wall  - 1-2 x daily - 7 x weekly - 1 sets - 10 reps - 5 hold - Standing Isometric Shoulder Abduction with Doorway - Arm Bent  - 1-2 x daily - 7 x weekly - 1 sets - 10 reps - 5 hold - Standing Isometric Shoulder External Rotation with Doorway  - 1-2 x daily - 7 x weekly - 1 sets - 10 reps - 5 hold  ASSESSMENT:  CLINICAL IMPRESSION: Pt tolerated session well today with trial of DN to biceps due to recent strain.  Overall progressing well with PT and will continue to benefit from PT  to maximize function.   OBJECTIVE IMPAIRMENTS: decreased activity tolerance, decreased coordination, decreased endurance, decreased mobility, decreased ROM, decreased strength, increased edema, increased fascial restrictions, impaired perceived functional ability, impaired flexibility, impaired UE functional use, postural dysfunction, and pain.   ACTIVITY LIMITATIONS: carrying, lifting, sleeping, bed mobility, bathing, dressing, self feeding, reach over head, and hygiene/grooming  PARTICIPATION LIMITATIONS: meal prep, cleaning, laundry, interpersonal relationship, driving, community activity, occupation, and yard work  PERSONAL FACTORS:  No specific limitations  are also affecting patient's functional outcome.   REHAB POTENTIAL: Good  CLINICAL DECISION MAKING: Stable/uncomplicated  EVALUATION COMPLEXITY: Low   GOALS: Goals reviewed with patient? Yes  SHORT TERM GOALS: (target date for Short term goals are 3 weeks 02/10/2023)  1.Patient will demonstrate independent use of home exercise program to maintain progress from in clinic treatments. Goal status: Met   LONG TERM GOALS: (target dates for all long  term goals are 10 weeks  03/31/2023 )   1. Patient will demonstrate/report pain at worst less than or equal to 2/10 to facilitate minimal limitation in daily activity secondary to pain symptoms. Goal status: on going 02/15/2023   2. Patient will demonstrate independent use of home exercise program to facilitate ability to maintain/progress functional gains from skilled physical therapy services. Goal status: on going 02/15/2023   3. Patient will demonstrate FOTO outcome > or = 65 % to indicate reduced disability due to condition. Goal status: on going 02/15/2023   4.  Patient will demonstrate Lt UE MMT 4/5 or greater  throughout to facilitate lifting, reaching, carrying at [facility] in daily activity.  Goal status: on going 02/15/2023   5.  Patient will demonstrate Lt  [facility] joint AROM WFL s symptoms to facilitate usual overhead reaching, self care, dressing at PLOF.   Goal status: on going 02/15/2023   6.  Patient will demonstrate/report ability to perform routine yard work at [employer] s limitation.  Goal status: on going 02/15/2023   PLAN:  PT FREQUENCY: 1-2x/week (start at 2)  PT DURATION: 10 weeks  PLANNED INTERVENTIONS: Therapeutic exercises, Therapeutic activity, Neuro Muscular re-education, Balance training, Gait training, Patient/Family education, Joint mobilization, Stair training, DME instructions, Dry Needling, Electrical stimulation, Traction, Cryotherapy, vasopneumatic device Moist heat, Taping, Ultrasound, Ionotophoresis 4mg /ml Dexamethasone, and aquatic therapy ,Manual therapy.  All included unless contraindicated  PLAN FOR NEXT SESSION:  how was DN, repeat PRN,  general ROM/strength reassessment, did mention appts only through next week; discuss whether we need to schedule more   Clarita Crane, PT, DPT 03/14/23 8:53 AM

## 2023-03-15 DIAGNOSIS — L814 Other melanin hyperpigmentation: Secondary | ICD-10-CM | POA: Diagnosis not present

## 2023-03-15 DIAGNOSIS — D2262 Melanocytic nevi of left upper limb, including shoulder: Secondary | ICD-10-CM | POA: Diagnosis not present

## 2023-03-15 DIAGNOSIS — L720 Epidermal cyst: Secondary | ICD-10-CM | POA: Diagnosis not present

## 2023-03-15 DIAGNOSIS — D2261 Melanocytic nevi of right upper limb, including shoulder: Secondary | ICD-10-CM | POA: Diagnosis not present

## 2023-03-15 DIAGNOSIS — D225 Melanocytic nevi of trunk: Secondary | ICD-10-CM | POA: Diagnosis not present

## 2023-03-15 DIAGNOSIS — Z85828 Personal history of other malignant neoplasm of skin: Secondary | ICD-10-CM | POA: Diagnosis not present

## 2023-03-15 DIAGNOSIS — Z129 Encounter for screening for malignant neoplasm, site unspecified: Secondary | ICD-10-CM | POA: Diagnosis not present

## 2023-03-15 DIAGNOSIS — L570 Actinic keratosis: Secondary | ICD-10-CM | POA: Diagnosis not present

## 2023-03-15 DIAGNOSIS — L821 Other seborrheic keratosis: Secondary | ICD-10-CM | POA: Diagnosis not present

## 2023-03-16 NOTE — Therapy (Signed)
OUTPATIENT PHYSICAL THERAPY TREATMENT   Patient Name: James Gallegos MRN: [id number] DOB:Oct 04, 1950, 72 y.o., male Today's Date: 03/17/2023    END OF SESSION:  PT End of Session - 03/17/23 0843     Visit Number 13    Number of Visits 20    Date for PT Re-Evaluation 03/31/23    Authorization Type [employer]    PT Start Time 0843    PT Stop Time 0922    PT Time Calculation (min) 39 min    Activity Tolerance Patient tolerated treatment well    Behavior During Therapy [facility] for tasks assessed/performed                   Past Medical History:  Diagnosis Date   Arthritis    back , R foot -   Deep vein thrombosis (HCC)    after knee surgery in 2004   Diabetes mellitus without complication (HCC)    Patients not aware of being dx   Hypertension    Murmur, cardiac    pt. reports its difficult to auscultate    Sleep apnea    CPAP- last study- 2014 cpap set on 13 per pt   Wears glasses 11/20/2020   for reading   Past Surgical History:  Procedure Laterality Date   ANTERIOR CERVICAL DECOMP/DISCECTOMY FUSION N/A 04/14/2020   Procedure: ANTERIOR CERVICAL DECOMPRESSION/DISCECTOMY FUSION, INTERBODY PROSTHESIS, PLATE/SCREWS CERVICAL THREE-FOUR,CERVICAL FOUR-FIVE,CERVICAL SIX-SEVEN;  Surgeon: Tressie Stalker, MD;  Location: [facility] OR;  Service: Neurosurgery;  Laterality: N/A;  anterior   BICEPT TENODESIS Left 01/04/2023   Procedure: LEFT BICEPS TENODESIS;  Surgeon: Cammy Copa, MD;  Location: [facility] ORS;  Service: Orthopedics;  Laterality: Left;   CERVICAL SPINE SURGERY N/A 1990   C5-6 discectomy plates and screws in neck per pt   colonscopy  2017   polyps removed per pt follow up in 5 years   FINGER SURGERY Right 2008   x 3 index finger   KNEE ARTHROSCOPY Bilateral    L 2004, R 2005   MASS EXCISION Right 01/18/2017   Procedure: EXCISION BENIGN RIGHT NECK MASS;  Surgeon: Drema Halon, MD;  Location: [facility];  Service: ENT;   Laterality: Right;   MASS EXCISION Bilateral 03/23/2019   Procedure: EXCISIONS OF RIGHT CHEEK LESION, RIGHT POSTERIOR NECK LIPOMA  AND LEFT NECK NODULE;  Surgeon: Drema Halon, MD;  Location: [facility];  Service: ENT;  Laterality: Bilateral;   NASAL FRACTURE SURGERY  1980   REVERSE SHOULDER ARTHROPLASTY Left 01/04/2023   Procedure: LEFT REVERSE SHOULDER ARTHROPLASTY;  Surgeon: Cammy Copa, MD;  Location: [facility];  Service: Orthopedics;  Laterality: Left;   TOTAL HIP ARTHROPLASTY Left 12/18/2021   Procedure: LEFT TOTAL HIP ARTHROPLASTY ANTERIOR APPROACH;  Surgeon: Kathryne Hitch, MD;  Location: [facility];  Service: Orthopedics;  Laterality: Left;   TOTAL KNEE ARTHROPLASTY Right 06/01/2016   Procedure: TOTAL KNEE ARTHROPLASTY;  Surgeon: Valeria Batman, MD;  Location: [facility] OR;  Service: Orthopedics;  Laterality: Right;   TOTAL KNEE ARTHROPLASTY Left 07/25/2018   Procedure: LEFT TOTAL KNEE ARTHROPLASTY;  Surgeon: Valeria Batman, MD;  Location: [facility] OR;  Service: Orthopedics;  Laterality: Left;   ULNAR NERVE TRANSPOSITION Left 11/25/2020   Procedure: ulnar nerve decompression left elbow;  Surgeon: Valeria Batman, MD;  Location: [facility] OR;  Service: Orthopedics;  Laterality: Left;   Patient Active Problem List   Diagnosis Date Noted   Left rotator cuff tear arthropathy 01/08/2023  Biceps tendonitis on left 01/08/2023   S/P reverse total shoulder arthroplasty, left 01/04/2023   Status post total replacement of left hip 12/18/2021   Pain in left hip 09/03/2021   Neuropathy, 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   Cervical spondylosis with myelopathy and radiculopathy 04/14/2020   Impingement syndrome of right shoulder 10/10/2019   Low back pain 10/10/2019   Irritation of ulnar nerve, left 08/28/2019   Class 2 obesity due to excess calories without serious comorbidity with body mass index (BMI) of 39.0 to 39.9 in adult 01/08/2019   Pain in right elbow 12/06/2018   Primary  osteoarthritis of left knee 07/25/2018   History of left knee replacement 07/25/2018   Right foot pain 06/22/2018   Acute left-sided low back pain with left-sided sciatica 06/22/2018   Osteoarthritis 11/21/2017   Hyperlipidemia 11/21/2017   Low testosterone 09/26/2016   Healthcare maintenance 09/26/2016   S/P total knee replacement using cement, right 06/01/2016   Sleep apnea 05/19/2016   Hypertension 05/19/2016    PCP: Lavada Mesi MD  REFERRING PROVIDER: Cammy Copa, MD  REFERRING DIAG: [phone] (ICD-10-CM) - S/P reverse total shoulder arthroplasty, left  THERAPY DIAG:  Chronic left shoulder pain  Muscle weakness (generalized)  Abnormal posture  Localized edema  Rationale for Evaluation and Treatment: Rehabilitation  ONSET DATE: surgery 01/04/2023  SUBJECTIVE:                                                                                                                                                                                      SUBJECTIVE STATEMENT: Pt indicated feeling like the last visit did help.  Was sore for a day or two.  Reported having improvement in reaching.    PERTINENT HISTORY: Previous C5-6 discectomy, presenting for C3-4, C4-5, C6-7 ACDF due to pain. PMH includes: arthritis, HTN, PNA, sleep apnea.   PAIN:  NPRS scale: last day or two :  2/10 Pain location: Lt shoulder  Pain description: ache, sore  Aggravating factors: after CPM use Relieving factors: ice  PRECAUTIONS: Avoid active IR strengthening, ER past 30 deg 4-6 weeks  WEIGHT BEARING RESTRICTIONS: No  FALLS:  Has patient fallen in last 6 months? 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  PLOF: Independent, Rt handed.  Reading, yard work (limited). Walking.   PATIENT GOALS:Reduce pain, get range better.   OBJECTIVE:   PATIENT SURVEYS:    03/17/2023: FOTO  update:  73  02/15/2023:  FOTO update:   57  01/20/2023 FOTO intake:   48  predicted:  63  COGNITION: 01/20/2023 Overall cognitive status:  WFL     SENSATION: 01/20/2023 WFL  POSTURE: 01/20/2023 Rounded shoulders   Localized edema Lt shoulder post surgery.   UPPER EXTREMITY ROM:   ROM Right 01/20/2023 Left 01/20/2023 Left 01/27/2023 Left 02/15/2023 AROM in supine Left 02/24/2023  Shoulder flexion WFL 90 AROM in supine  120 PROM in supine 140 deg PROM in supine 138 AROM  145 AROM in supine  Shoulder extension       Shoulder abduction WFL 90 PROM in supine   118 AROM   Shoulder adduction       Shoulder internal rotation       Shoulder external rotation  PROM 10 deg  in 30 deg abduction   35 in 45 deg abduction supine   (Blank rows = not tested)  UPPER EXTREMITY MMT:  MMT Right 01/20/2023 Left 01/20/2023  Held shoulder based MMT due to surgery protocol Left 02/07/2023 Left 02/15/2023 Left 03/04/2023 Left 03/10/2023  Shoulder flexion 5/5   3+/5 4/5 c pain   Shoulder extension        Shoulder abduction 5/5    4+/5   Shoulder adduction        Shoulder internal rotation 5/5       Shoulder external rotation 5/5    3+/5   Middle trapezius        Lower trapezius        Elbow flexion 5/5 5/5 4/5   5/5 without pain  Elbow extension 5/5 5/5      Wrist flexion        Wrist extension        Wrist ulnar deviation        Wrist radial deviation        Wrist pronation        Wrist supination      4+/5 without pain  Grip strength (lbs)        (Blank rows = not tested)  SHOULDER SPECIAL TESTS: 01/20/2023 (+) Shrug noted in active Lt shoulder flexion attempt vs. Gravity.   JOINT MOBILITY TESTING:  01/20/2023 Guarding noted but not specific joint mobility noted.   PALPATION:  01/20/2023 General tenderness around Lt shoulder.                                                                                                                                                                                                   TODAY'S TREATMENT:  DATE:  03/17/2023 Therex: UBE Fwd/back 4 mins each way lvl 2.5 BATCA machine rows 2 x 15 20 lbs BATCA lat pull down 15 lbs 2 x 15 Standing blue band GH ext high attachment 2 x 15 Standing green band ER c towel under arm reaching isometric step outs, 5 hold x 10    Manual STM with compression to Lt biceps; skilled palpation and monitoring of soft tissue during DN  Trigger Point Dry-Needling  Treatment instructions: Expect mild to moderate muscle soreness. S/S of pneumothorax if dry needled over a lung field, and to seek immediate medical attention should they occur. Patient verbalized understanding of these instructions and education. Patient Consent Given: Yes Education handout provided: Yes Muscles treated: Lt biceps in supine Electrical stimulation performed: No Treatment response/outcome: twitch responses noted   TODAY'S TREATMENT:                                                                                                       DATE:  03/14/2023 Therex: UBE L5 x 8 min (4 min each direction) BATCA rows 2x10; 20# Wall ladder flexion and scaption 2# wrist weight x 10 reps each Shoulder flexion with 2# to 90 deg x 10 reps  Manual STM with compression to Lt biceps; skilled palpation and monitoring of soft tissue during DN  Trigger Point Dry-Needling  Treatment instructions: Expect mild to moderate muscle soreness. S/S of pneumothorax if dry needled over a lung field, and to seek immediate medical attention should they occur. Patient verbalized understanding of these instructions and education. Patient Consent Given: Yes Education handout provided: Yes Muscles treated: Lt biceps Electrical stimulation performed: Yes Parameters:  8 mA freq with intensity to tolerance x 5 min Treatment response/outcome: twitch responses  noted   TODAY'S TREATMENT:                                                                                                       DATE:  03/10/2023 Therex: UBE fwd/back UE only lvl 3.0 4 mins each way Cues given to reduced frequency of strengthening intervention with every other day variation in lying down and standing elevation strengthening to reduced impact on symptom area.    Manual Cross friction to proximal biceps and anterior joint c frozen tool for cryotherapy aspect.  Barrier of massage cream used in area during use.   TODAY'S TREATMENT:  DATE: 03/07/2023 Therex: UBE fwd/back UE only lvl 3.0 4 mins each way Standing high blue band attachment on wall for GH ext 2 x 15  Standing green band ER isometric walk out holds with towel under arm Lt 5 sec hold x 15  Machine chest press double arm with SA press hold 2 seconds 10 lbs 2 x 15  Machine rows 20 lbs 2 x 15   Standing eccentric only flexion lowering Lt arm x15 from 120 deg to 0 1 lb  Standing Lt shoulder abduction x 15 1 lb    TODAY'S TREATMENT:                                                                                                       DATE: 03/04/2023 Therex: UBE fwd/back UE only lvl 3.0 4 mins each way Machine chest press double arm with SA press hold 2 seconds 10 lbs 2 x 15  Supine Lt arm chest press with SA hold with dumbbell 3 lbs x 15  Supine 90 deg flexion circles clockwise, counterclockwise 3 lbs  2 x 20 each way Standing eccentric only flexion lowering Lt arm 2 x 10 from 90 deg to 0  Standing Lt shoulder abduction x 10  Standing high blue band attachment on wall for GH ext 2 x 10    PATIENT EDUCATION: 01/20/2023 Education details: HEP update  Person educated: Patient Education method: Programmer, multimedia, Demonstration, Verbal cues, and Handouts Education comprehension: verbalized understanding, returned  demonstration, and verbal cues required  HOME EXERCISE PROGRAM: Access Code: [email or web address] URL: [email or web address] Date: 02/24/2023 Prepared by: Chyrel Masson  Exercises - Supine Shoulder Flexion Extension AAROM with Dowel  - 1-2 x daily - 7 x weekly - 1-2 sets - 10-15 reps - 3 hold - Seated Scapular Retraction  - 3-5 x daily - 7 x weekly - 1 sets - 10 reps - 3-5 hold - Standing Shoulder Row with Anchored Resistance  - 1-2 x daily - 7 x weekly - 2 sets - 10 reps - 5 sec hold - Shoulder extension with resistance - Neutral  - 1-2 x daily - 7 x weekly - 2 sets - 10 reps - 5 sec hold - Supine Shoulder Flexion Extension Full Range AROM (Mirrored)  - 1-2 x daily - 7 x weekly - 2-3 sets - 10-15 reps - Sidelying Shoulder Abduction Palm Forward (Mirrored)  - 1-2 x daily - 7 x weekly - 2-3 sets - 10-15 reps - Sidelying Shoulder External Rotation  - 1-2 x daily - 7 x weekly - 2-3 sets - 10-15 reps - Isometric Shoulder Flexion at Wall  - 1-2 x daily - 7 x weekly - 1 sets - 10 reps - 5 hold - Standing Isometric Shoulder Abduction with Doorway - Arm Bent  - 1-2 x daily - 7 x weekly - 1 sets - 10 reps - 5 hold - Standing Isometric Shoulder External Rotation with Doorway  - 1-2 x daily - 7 x weekly - 1 sets - 10 reps - 5 hold  ASSESSMENT:  CLINICAL IMPRESSION: Positive benefits  in manual/dry needling noted from last 2 visits.  FOTO reassessment showed great improvement compared to last reassessment.  At this time, plan to reassess next visit and if well, progress towards HEP trial period.    OBJECTIVE IMPAIRMENTS: decreased activity tolerance, decreased coordination, decreased endurance, decreased mobility, decreased ROM, decreased strength, increased edema, increased fascial restrictions, impaired perceived functional ability, impaired flexibility, impaired UE functional use, postural dysfunction, and pain.   ACTIVITY LIMITATIONS: carrying, lifting, sleeping, bed mobility, bathing,  dressing, self feeding, reach over head, and hygiene/grooming  PARTICIPATION LIMITATIONS: meal prep, cleaning, laundry, interpersonal relationship, driving, community activity, occupation, and yard work  PERSONAL FACTORS:  No specific limitations  are also affecting patient's functional outcome.   REHAB POTENTIAL: Good  CLINICAL DECISION MAKING: Stable/uncomplicated  EVALUATION COMPLEXITY: Low   GOALS: Goals reviewed with patient? Yes  SHORT TERM GOALS: (target date for Short term goals are 3 weeks 02/10/2023)  1.Patient will demonstrate independent use of home exercise program to maintain progress from in clinic treatments. Goal status: Met   LONG TERM GOALS: (target dates for all long term goals are 10 weeks  03/31/2023 )   1. Patient will demonstrate/report pain at worst less than or equal to 2/10 to facilitate minimal limitation in daily activity secondary to pain symptoms. Goal status: on going 03/17/2023   2. Patient will demonstrate independent use of home exercise program to facilitate ability to maintain/progress functional gains from skilled physical therapy services. Goal status: on going 03/17/2023   3. Patient will demonstrate FOTO outcome > or = 65 % to indicate reduced disability due to condition. Goal status: Met 03/17/2023   4.  Patient will demonstrate Lt UE MMT 4/5 or greater  throughout to facilitate lifting, reaching, carrying at [facility] in daily activity.  Goal status: on going 03/17/2023   5.  Patient will demonstrate Lt  [facility] joint AROM WFL s symptoms to facilitate usual overhead reaching, self care, dressing at PLOF.   Goal status: on going 03/17/2023   6.  Patient will demonstrate/report ability to perform routine yard work at [employer] s limitation.  Goal status: on going 03/17/2023   PLAN:  PT FREQUENCY: 1-2x/week (start at 2)  PT DURATION: 10 weeks  PLANNED INTERVENTIONS: Therapeutic exercises, Therapeutic activity, Neuro Muscular re-education, Balance  training, Gait training, Patient/Family education, Joint mobilization, Stair training, DME instructions, Dry Needling, Electrical stimulation, Traction, Cryotherapy, vasopneumatic device Moist heat, Taping, Ultrasound, Ionotophoresis 4mg /ml Dexamethasone, and aquatic therapy ,Manual therapy.  All included unless contraindicated  PLAN FOR NEXT SESSION:  Check on response.  Objective reassessment.  If symptoms stay better, HEP trial recommended.    Chyrel Masson, PT, DPT, OCS, ATC 03/17/23  9:22 AM

## 2023-03-17 ENCOUNTER — Encounter: Payer: Self-pay | Admitting: Rehabilitative and Restorative Service Providers"

## 2023-03-17 ENCOUNTER — Ambulatory Visit: Payer: PPO | Admitting: Rehabilitative and Restorative Service Providers"

## 2023-03-17 DIAGNOSIS — G8929 Other chronic pain: Secondary | ICD-10-CM | POA: Diagnosis not present

## 2023-03-17 DIAGNOSIS — M25512 Pain in left shoulder: Secondary | ICD-10-CM | POA: Diagnosis not present

## 2023-03-17 DIAGNOSIS — M6281 Muscle weakness (generalized): Secondary | ICD-10-CM

## 2023-03-17 DIAGNOSIS — R293 Abnormal posture: Secondary | ICD-10-CM

## 2023-03-17 DIAGNOSIS — R600 Localized edema: Secondary | ICD-10-CM

## 2023-03-21 ENCOUNTER — Ambulatory Visit: Payer: PPO | Admitting: Rehabilitative and Restorative Service Providers"

## 2023-03-21 ENCOUNTER — Encounter: Payer: Self-pay | Admitting: Rehabilitative and Restorative Service Providers"

## 2023-03-21 DIAGNOSIS — M25512 Pain in left shoulder: Secondary | ICD-10-CM

## 2023-03-21 DIAGNOSIS — G8929 Other chronic pain: Secondary | ICD-10-CM

## 2023-03-21 DIAGNOSIS — M6281 Muscle weakness (generalized): Secondary | ICD-10-CM

## 2023-03-21 DIAGNOSIS — R293 Abnormal posture: Secondary | ICD-10-CM | POA: Diagnosis not present

## 2023-03-21 DIAGNOSIS — R600 Localized edema: Secondary | ICD-10-CM | POA: Diagnosis not present

## 2023-03-21 NOTE — Therapy (Addendum)
OUTPATIENT PHYSICAL THERAPY TREATMENT / PROGRESS NOTE  / DISCHARGE   Patient Name: James Gallegos MRN: [id number] DOB:[birth year], 72 y.o., male Today's Date: 03/21/2023   Progress Note Reporting Period 02/15/2023 to 03/21/2023  See note below for Objective Data and Assessment of Progress/Goals.    END OF SESSION:  PT End of Session - 03/21/23 0817     Visit Number 14    Number of Visits 20    Date for PT Re-Evaluation 03/31/23    Authorization Type [employer]    PT Start Time 0803    PT Stop Time 0841    PT Time Calculation (min) 38 min    Activity Tolerance Patient tolerated treatment well    Behavior During Therapy WFL for tasks assessed/performed             Past Medical History:  Diagnosis Date   Arthritis    back , R foot -   Deep vein thrombosis (HCC)    after knee surgery in 2004   Diabetes mellitus without complication (HCC)    Patients not aware of being dx   Hypertension    Murmur, cardiac    pt. reports its difficult to auscultate    Sleep apnea    CPAP- last study- 2014 cpap set on 13 per pt   Wears glasses 11/20/2020   for reading   Past Surgical History:  Procedure Laterality Date   ANTERIOR CERVICAL DECOMP/DISCECTOMY FUSION N/A 04/14/2020   Procedure: ANTERIOR CERVICAL DECOMPRESSION/DISCECTOMY FUSION, INTERBODY PROSTHESIS, PLATE/SCREWS CERVICAL THREE-FOUR,CERVICAL FOUR-FIVE,CERVICAL SIX-SEVEN;  Surgeon: Tressie Stalker, MD;  Location: [facility] OR;  Service: Neurosurgery;  Laterality: N/A;  anterior   BICEPT TENODESIS Left 01/04/2023   Procedure: LEFT BICEPS TENODESIS;  Surgeon: Cammy Copa, MD;  Location: [facility]S;  Service: Orthopedics;  Laterality: Left;   CERVICAL SPINE SURGERY N/A 1990   C5-6 discectomy plates and screws in neck per pt   colonscopy  2017   polyps removed per pt follow up in 5 years   FINGER SURGERY Right 2008   x 3 index finger   KNEE ARTHROSCOPY Bilateral    L 2004, R 2005   MASS EXCISION Right 01/18/2017    Procedure: EXCISION BENIGN RIGHT NECK MASS;  Surgeon: Drema Halon, MD;  Location: [facility];  Service: ENT;  Laterality: Right;   MASS EXCISION Bilateral 03/23/2019   Procedure: EXCISIONS OF RIGHT CHEEK LESION, RIGHT POSTERIOR NECK LIPOMA  AND LEFT NECK NODULE;  Surgeon: Drema Halon, MD;  Location: [facility];  Service: ENT;  Laterality: Bilateral;   NASAL FRACTURE SURGERY  1980   REVERSE SHOULDER ARTHROPLASTY Left 01/04/2023   Procedure: LEFT REVERSE SHOULDER ARTHROPLASTY;  Surgeon: Cammy Copa, MD;  Location: [facility];  Service: Orthopedics;  Laterality: Left;   TOTAL HIP ARTHROPLASTY Left 12/18/2021   Procedure: LEFT TOTAL HIP ARTHROPLASTY ANTERIOR APPROACH;  Surgeon: Kathryne Hitch, MD;  Location: [facility];  Service: Orthopedics;  Laterality: Left;   TOTAL KNEE ARTHROPLASTY Right 06/01/2016   Procedure: TOTAL KNEE ARTHROPLASTY;  Surgeon: Valeria Batman, MD;  Location: [facility] OR;  Service: Orthopedics;  Laterality: Right;   TOTAL KNEE ARTHROPLASTY Left 07/25/2018   Procedure: LEFT TOTAL KNEE ARTHROPLASTY;  Surgeon: Valeria Batman, MD;  Location: [facility] OR;  Service: Orthopedics;  Laterality: Left;   ULNAR NERVE TRANSPOSITION Left 11/25/2020   Procedure: ulnar nerve decompression left elbow;  Surgeon: Valeria Batman, MD;  Location: [facility] OR;  Service: Orthopedics;  Laterality:  Left;   Patient Active Problem List   Diagnosis Date Noted   Left rotator cuff tear arthropathy 01/08/2023   Biceps tendonitis on left 01/08/2023   S/P reverse total shoulder arthroplasty, left 01/04/2023   Status post total replacement of left hip 12/18/2021   Pain in left hip 09/03/2021   Neuropathy, 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   Cervical spondylosis with myelopathy and radiculopathy 04/14/2020   Impingement syndrome of right shoulder 10/10/2019   Low back pain 10/10/2019   Irritation of ulnar nerve, left 08/28/2019   Class 2 obesity due to excess calories  without serious comorbidity with body mass index (BMI) of 39.0 to 39.9 in adult 01/08/2019   Pain in right elbow 12/06/2018   Primary osteoarthritis of left knee 07/25/2018   History of left knee replacement 07/25/2018   Right foot pain 06/22/2018   Acute left-sided low back pain with left-sided sciatica 06/22/2018   Osteoarthritis 11/21/2017   Hyperlipidemia 11/21/2017   Low testosterone 09/26/2016   Healthcare maintenance 09/26/2016   S/P total knee replacement using cement, right 06/01/2016   Sleep apnea 05/19/2016   Hypertension 05/19/2016    PCP: Lavada Mesi MD  REFERRING PROVIDER: Cammy Copa, MD  REFERRING DIAG: [phone] (ICD-10-CM) - S/P reverse total shoulder arthroplasty, left  THERAPY DIAG:  Chronic left shoulder pain  Muscle weakness (generalized)  Abnormal posture  Localized edema  Rationale for Evaluation and Treatment: Rehabilitation  ONSET DATE: surgery 01/04/2023  SUBJECTIVE:                                                                                                                                                                                      SUBJECTIVE STATEMENT: Pt indicated continued improvement in symptoms since dry needling use.  Reported ready to try HEP.     PERTINENT HISTORY: Previous C5-6 discectomy, presenting for C3-4, C4-5, C6-7 ACDF due to pain. PMH includes: arthritis, HTN, PNA, sleep apnea.   PAIN:  NPRS scale: last day or two : up to 1/10 Pain location: Lt shoulder  Pain description: ache, sore  Aggravating factors: after CPM use Relieving factors: ice  PRECAUTIONS: Avoid active IR strengthening, ER past 30 deg 4-6 weeks  WEIGHT BEARING RESTRICTIONS: No  FALLS:  Has patient fallen in last 6 months? 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  PLOF: Independent, Rt handed.  Reading, yard work (limited).  Walking.   PATIENT GOALS:Reduce pain, get range better.   OBJECTIVE:   PATIENT SURVEYS:    03/17/2023: FOTO update:  75  02/15/2023:  FOTO update:   59  01/20/2023 FOTO intake:   50  predicted:  63  COGNITION: 01/20/2023 Overall cognitive status: WFL     SENSATION: 01/20/2023 WFL  POSTURE: 01/20/2023 Rounded shoulders   Localized edema Lt shoulder post surgery.   UPPER EXTREMITY ROM:   ROM Right 01/20/2023 Left 01/20/2023 Left 01/27/2023 Left 02/15/2023 AROM in supine Left 02/24/2023 Left 03/21/2023  Shoulder flexion WFL 90 AROM in supine  120 PROM in supine 140 deg PROM in supine 138 AROM  145 AROM in supine 150 AROM in supine  Shoulder extension        Shoulder abduction WFL 90 PROM in supine   118 AROM  130 AROM in supine  Shoulder adduction        Shoulder internal rotation        Shoulder external rotation  PROM 10 deg  in 30 deg abduction   35 in 45 deg abduction supine  55 in 45 deg abduction supine  (Blank rows = not tested)  UPPER EXTREMITY MMT:  MMT Right 01/20/2023 Left 01/20/2023  Held shoulder based MMT due to surgery protocol Left 02/07/2023 Left 02/15/2023 Left 03/04/2023 Left 03/10/2023 Left 03/21/2023  Shoulder flexion 5/5   3+/5 4/5 c pain  4+/5  Shoulder extension         Shoulder abduction 5/5    4+/5  5/5  Shoulder adduction         Shoulder internal rotation 5/5        Shoulder external rotation 5/5    3+/5  4/5  Middle trapezius         Lower trapezius         Elbow flexion 5/5 5/5 4/5   5/5 without pain   Elbow extension 5/5 5/5       Wrist flexion         Wrist extension         Wrist ulnar deviation         Wrist radial deviation         Wrist pronation         Wrist supination      4+/5 without pain   Grip strength (lbs)         (Blank rows = not tested)  SHOULDER SPECIAL TESTS: 01/20/2023 (+) Shrug noted in active Lt shoulder flexion attempt vs. Gravity.   JOINT MOBILITY TESTING:  01/20/2023 Guarding noted but not specific joint mobility  noted.   PALPATION:  01/20/2023 General tenderness around Lt shoulder.  TODAY'S TREATMENT:                                                                                                       DATE:  03/21/2023 Therex: UBE Fwd/back 4 mins each way lvl 3.0 Standing green band ER c towel under arm reaching isometric step outs, 5 hold x 10  Green band Lt elbow eccentric flexion 2 x 10 in standing  Review of existing HEP c verbal cues for techniques, frequency for continued gains.  Additional time spent in review with Q&A with patient regarding cues.    Manual STM with compression to Lt biceps; skilled palpation and monitoring of soft tissue during DN  Trigger Point Dry-Needling  Treatment instructions: Expect mild to moderate muscle soreness. S/S of pneumothorax if dry needled over a lung field, and to seek immediate medical attention should they occur. Patient verbalized understanding of these instructions and education. Patient Consent Given: Yes Education handout provided: Yes Muscles treated: Lt biceps in supine Electrical stimulation performed: No Treatment response/outcome: twitch responses noted  TODAY'S TREATMENT:                                                                                                       DATE:  03/17/2023 Therex: UBE Fwd/back 4 mins each way lvl 2.5 BATCA machine rows 2 x 15 20 lbs BATCA lat pull down 15 lbs 2 x 15 Standing blue band GH ext high attachment 2 x 15 Standing green band ER c towel under arm reaching isometric step outs, 5 hold x 10    Manual STM with compression to Lt biceps; skilled palpation and monitoring of soft tissue during DN  Trigger Point Dry-Needling  Treatment instructions: Expect mild to moderate muscle soreness. S/S of pneumothorax if  dry needled over a lung field, and to seek immediate medical attention should they occur. Patient verbalized understanding of these instructions and education. Patient Consent Given: Yes Education handout provided: Yes Muscles treated: Lt biceps in supine Electrical stimulation performed: No Treatment response/outcome: twitch responses noted   TODAY'S TREATMENT:  DATE:  03/14/2023 Therex: UBE L5 x 8 min (4 min each direction) BATCA rows 2x10; 20# Wall ladder flexion and scaption 2# wrist weight x 10 reps each Shoulder flexion with 2# to 90 deg x 10 reps  Manual STM with compression to Lt biceps; skilled palpation and monitoring of soft tissue during DN  Trigger Point Dry-Needling  Treatment instructions: Expect mild to moderate muscle soreness. S/S of pneumothorax if dry needled over a lung field, and to seek immediate medical attention should they occur. Patient verbalized understanding of these instructions and education. Patient Consent Given: Yes Education handout provided: Yes Muscles treated: Lt biceps Electrical stimulation performed: Yes Parameters:  8 mA freq with intensity to tolerance x 5 min Treatment response/outcome: twitch responses noted   TODAY'S TREATMENT:                                                                                                       DATE:  03/10/2023 Therex: UBE fwd/back UE only lvl 3.0 4 mins each way Cues given to reduced frequency of strengthening intervention with every other day variation in lying down and standing elevation strengthening to reduced impact on symptom area.    Manual Cross friction to proximal biceps and anterior joint c frozen tool for cryotherapy aspect.  Barrier of massage cream used in area during use.   TODAY'S TREATMENT:                                                                                                        DATE: 03/07/2023 Therex: UBE fwd/back UE only lvl 3.0 4 mins each way Standing high blue band attachment on wall for GH ext 2 x 15  Standing green band ER isometric walk out holds with towel under arm Lt 5 sec hold x 15  Machine chest press double arm with SA press hold 2 seconds 10 lbs 2 x 15  Machine rows 20 lbs 2 x 15   Standing eccentric only flexion lowering Lt arm x15 from 120 deg to 0 1 lb  Standing Lt shoulder abduction x 15 1 lb     PATIENT EDUCATION: 03/21/2023 Education details: HEP update  Person educated: Patient Education method: Programmer, multimedia, Demonstration, Verbal cues, and Handouts Education comprehension: verbalized understanding, returned demonstration, and verbal cues required  HOME EXERCISE PROGRAM: Access Code: [email or web address] URL: [email or web address] Date: 03/21/2023 Prepared by: Chyrel Masson  Exercises - Standing shoulder flexion wall slides  - 2-3 x daily - 7 x weekly - 1 sets - 10 reps - 5 hold - Seated Scapular Retraction  - 3-5 x daily - 7 x weekly - 1 sets - 10 reps -  3-5 hold - Standing Shoulder Row with Anchored Resistance  - 1 x daily - 7 x weekly - 2 sets - 10 reps - 5 sec hold - Shoulder extension with resistance - Neutral  - 1 x daily - 7 x weekly - 2 sets - 10 reps - 5 sec hold - Sidelying Shoulder Abduction Palm Forward (Mirrored)  - 1 x daily - 7 x weekly - 2-3 sets - 10-15 reps - Sidelying Shoulder External Rotation  - 1 x daily - 7 x weekly - 2-3 sets - 10-15 reps - Standing Isometric Shoulder External Rotation with Doorway  - 1-2 x daily - 7 x weekly - 1 sets - 10 reps - 5-10 hold - Standing Shoulder Flexion to 90 Degrees with Dumbbells  - 1 x daily - 7 x weekly - 2-3 sets - 10-15 reps - Standing Single Arm Elbow Flexion with Resistance  - 1 x daily - 7 x weekly - 2-3 sets - 10 reps  ASSESSMENT:  CLINICAL IMPRESSION: Pt continued to report improved Lt arm use and pain relief over last visit.  Due to improvements and  objective data results today, reocmmend trial HEP period at this time.  Pt was in agreement.    OBJECTIVE IMPAIRMENTS: decreased activity tolerance, decreased coordination, decreased endurance, decreased mobility, decreased ROM, decreased strength, increased edema, increased fascial restrictions, impaired perceived functional ability, impaired flexibility, impaired UE functional use, postural dysfunction, and pain.   ACTIVITY LIMITATIONS: carrying, lifting, sleeping, bed mobility, bathing, dressing, self feeding, reach over head, and hygiene/grooming  PARTICIPATION LIMITATIONS: meal prep, cleaning, laundry, interpersonal relationship, driving, community activity, occupation, and yard work  PERSONAL FACTORS:  No specific limitations  are also affecting patient's functional outcome.   REHAB POTENTIAL: Good  CLINICAL DECISION MAKING: Stable/uncomplicated  EVALUATION COMPLEXITY: Low   GOALS: Goals reviewed with patient? Yes  SHORT TERM GOALS: (target date for Short term goals are 3 weeks 02/10/2023)  1.Patient will demonstrate independent use of home exercise program to maintain progress from in clinic treatments. Goal status: Met   LONG TERM GOALS: (target dates for all long term goals are 10 weeks  03/31/2023 )   1. Patient will demonstrate/report pain at worst less than or equal to 2/10 to facilitate minimal limitation in daily activity secondary to pain symptoms. Goal status: Met 03/21/2023   2. Patient will demonstrate independent use of home exercise program to facilitate ability to maintain/progress functional gains from skilled physical therapy services. Goal status:Met 03/21/2023   3. Patient will demonstrate FOTO outcome > or = 65 % to indicate reduced disability due to condition. Goal status: Met 03/17/2023   4.  Patient will demonstrate Lt UE MMT 4/5 or greater  throughout to facilitate lifting, reaching, carrying at [facility] in daily activity.  Goal status: Met 03/21/2023   5.   Patient will demonstrate Lt  [facility] joint AROM WFL s symptoms to facilitate usual overhead reaching, self care, dressing at PLOF.   Goal status: Mostly Met 03/21/2023   6.  Patient will demonstrate/report ability to perform routine yard work at [employer] s limitation.  Goal status: on going 03/17/2023   PLAN:  PT FREQUENCY: 1-2x/week (start at 2)  PT DURATION: 10 weeks  PLANNED INTERVENTIONS: Therapeutic exercises, Therapeutic activity, Neuro Muscular re-education, Balance training, Gait training, Patient/Family education, Joint mobilization, Stair training, DME instructions, Dry Needling, Electrical stimulation, Traction, Cryotherapy, vasopneumatic device Moist heat, Taping, Ultrasound, Ionotophoresis 4mg /ml Dexamethasone, and aquatic therapy ,Manual therapy.  All included unless contraindicated  PLAN  FOR NEXT SESSION:  Trial HEP.  Discharge after 30 days inactivity.  Recert required if return.    Chyrel Masson, PT, DPT, OCS, ATC 03/21/23  8:44 AM   PHYSICAL THERAPY DISCHARGE SUMMARY  Visits from Start of Care: 14  Current functional level related to goals / functional outcomes: See note   Remaining deficits: See note   Education / Equipment: HEP  Patient goals were  mostly met . Patient is being discharged due to not returning since the last visit.  Chyrel Masson, PT, DPT, OCS, ATC 05/04/23  9:51 AM

## 2023-03-23 ENCOUNTER — Encounter: Payer: PPO | Admitting: Physical Therapy

## 2023-03-30 ENCOUNTER — Encounter: Payer: Self-pay | Admitting: Orthopedic Surgery

## 2023-03-30 ENCOUNTER — Ambulatory Visit: Payer: PPO | Admitting: Surgical

## 2023-03-30 DIAGNOSIS — Z96612 Presence of left artificial shoulder joint: Secondary | ICD-10-CM

## 2023-03-30 NOTE — Progress Notes (Signed)
Post-Op Visit Note   Patient: James Gallegos           Date of Birth: 08/03/1950           MRN: [id number] Visit Date: 03/30/2023 PCP: Lavada Mesi, MD   Assessment & Plan:  Chief Complaint:  Chief Complaint  Patient presents with   Left Shoulder - Follow-up   Visit Diagnoses:  1. S/P reverse total shoulder arthroplasty, left     Plan: Patient is a 72 year old male who presents s/p left reverse shoulder arthroplasty with bicep tenodesis on 01/04/2023.  He is doing well.  Really has no complaints.  Does not take any medications for pain.  Did have some bicep spasm previously that was successfully treated with dry needling and TENS unit therapy at physical therapy.  He has now finished formal physical therapy and is just doing a home exercise program.  He is able to get his arm overhead without any significant difficulty.  No instability episodes.  On exam, patient has 40 degrees external rotation, 85 degrees abduction, 150 degrees forward elevation passively and actively.  Axillary nerve intact with deltoid firing.  Incision well-healed.  2+ radial pulse of the operative extremity.  Intact EPL, FPL, finger abduction.  Intact bicep flexion and supination strength.  Intact subscapularis strength.  Plan is to continue with home exercise program.  Caution patient against any heavy lifting more than 25 to 30 pounds or any lifting overhead significantly.  Counseled on dental antibiotic prophylaxis.  Follow-up with the office as needed.  Follow-Up Instructions: Return if symptoms worsen or fail to improve.   Orders:  No orders of the defined types were placed in this encounter.  No orders of the defined types were placed in this encounter.   Imaging: No results found.  PMFS History: Patient Active Problem List   Diagnosis Date Noted   Left rotator cuff tear arthropathy 01/08/2023   Biceps tendonitis on left 01/08/2023   S/P reverse total shoulder arthroplasty, left 01/04/2023    Status post total replacement of left hip 12/18/2021   Pain in left hip 09/03/2021   Neuropathy, 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   Cervical spondylosis with myelopathy and radiculopathy 04/14/2020   Impingement syndrome of right shoulder 10/10/2019   Low back pain 10/10/2019   Irritation of ulnar nerve, left 08/28/2019   Class 2 obesity due to excess calories without serious comorbidity with body mass index (BMI) of 39.0 to 39.9 in adult 01/08/2019   Pain in right elbow 12/06/2018   Primary osteoarthritis of left knee 07/25/2018   History of left knee replacement 07/25/2018   Right foot pain 06/22/2018   Acute left-sided low back pain with left-sided sciatica 06/22/2018   Osteoarthritis 11/21/2017   Hyperlipidemia 11/21/2017   Low testosterone 09/26/2016   Healthcare maintenance 09/26/2016   S/P total knee replacement using cement, right 06/01/2016   Sleep apnea 05/19/2016   Hypertension 05/19/2016   Past Medical History:  Diagnosis Date   Arthritis    back , R foot -   Deep vein thrombosis (HCC)    after knee surgery in 2004   Diabetes mellitus without complication (HCC)    Patients not aware of being dx   Hypertension    Murmur, cardiac    pt. reports its difficult to auscultate    Sleep apnea    CPAP- last study- 2014 cpap set on 13 per pt   Wears glasses 11/20/2020   for reading    Family History  Problem  Relation Age of Onset   Dementia Mother    Alzheimer's disease Father    Healthy Brother    Colon cancer Neg Hx    Esophageal cancer Neg Hx    Rectal cancer Neg Hx    Stomach cancer Neg Hx    Cancer Neg Hx    Diabetes Neg Hx     Past Surgical History:  Procedure Laterality Date   ANTERIOR CERVICAL DECOMP/DISCECTOMY FUSION N/A 04/14/2020   Procedure: ANTERIOR CERVICAL DECOMPRESSION/DISCECTOMY FUSION, INTERBODY PROSTHESIS, PLATE/SCREWS CERVICAL THREE-FOUR,CERVICAL FOUR-FIVE,CERVICAL SIX-SEVEN;  Surgeon: Tressie Stalker, MD;  Location: [facility] OR;  Service: Neurosurgery;   Laterality: N/A;  anterior   BICEPT TENODESIS Left 01/04/2023   Procedure: LEFT BICEPS TENODESIS;  Surgeon: Cammy Copa, MD;  Location: [facility];  Service: Orthopedics;  Laterality: Left;   CERVICAL SPINE SURGERY N/A 1990   C5-6 discectomy plates and screws in neck per pt   colonscopy  2017   polyps removed per pt follow up in 5 years   FINGER SURGERY Right 2008   x 3 index finger   KNEE ARTHROSCOPY Bilateral    L 2004, R 2005   MASS EXCISION Right 01/18/2017   Procedure: EXCISION BENIGN RIGHT NECK MASS;  Surgeon: Drema Halon, MD;  Location: [facility];  Service: ENT;  Laterality: Right;   MASS EXCISION Bilateral 03/23/2019   Procedure: EXCISIONS OF RIGHT CHEEK LESION, RIGHT POSTERIOR NECK LIPOMA  AND LEFT NECK NODULE;  Surgeon: Drema Halon, MD;  Location: [facility];  Service: ENT;  Laterality: Bilateral;   NASAL FRACTURE SURGERY  1980   REVERSE SHOULDER ARTHROPLASTY Left 01/04/2023   Procedure: LEFT REVERSE SHOULDER ARTHROPLASTY;  Surgeon: Cammy Copa, MD;  Location: [facility];  Service: Orthopedics;  Laterality: Left;   TOTAL HIP ARTHROPLASTY Left 12/18/2021   Procedure: LEFT TOTAL HIP ARTHROPLASTY ANTERIOR APPROACH;  Surgeon: Kathryne Hitch, MD;  Location: [facility];  Service: Orthopedics;  Laterality: Left;   TOTAL KNEE ARTHROPLASTY Right 06/01/2016   Procedure: TOTAL KNEE ARTHROPLASTY;  Surgeon: Valeria Batman, MD;  Location: [facility] OR;  Service: Orthopedics;  Laterality: Right;   TOTAL KNEE ARTHROPLASTY Left 07/25/2018   Procedure: LEFT TOTAL KNEE ARTHROPLASTY;  Surgeon: Valeria Batman, MD;  Location: [facility] OR;  Service: Orthopedics;  Laterality: Left;   ULNAR NERVE TRANSPOSITION Left 11/25/2020   Procedure: ulnar nerve decompression left elbow;  Surgeon: Valeria Batman, MD;  Location: [facility] OR;  Service: Orthopedics;  Laterality: Left;   Social History   Occupational History   Not on file  Tobacco Use   Smoking  status: Never   Smokeless tobacco: Never  Vaping Use   Vaping status: Never Used  Substance and Sexual Activity   Alcohol use: Yes    Comment: rarely wine   Drug use: Never   Sexual activity: Not on file

## 2023-04-05 ENCOUNTER — Telehealth: Payer: Self-pay | Admitting: *Deleted

## 2023-04-05 NOTE — Telephone Encounter (Signed)
(  Late entry for 03/30/23)- Seen during his f/u with Dr. Diamantina Providence PA. Doing very well and pleased with surgery and recovery. He is 90 days post op and reports his shoulder as 80% out of 100% for SANE question for shoulders after surgery.

## 2023-05-26 DIAGNOSIS — H40013 Open angle with borderline findings, low risk, bilateral: Secondary | ICD-10-CM | POA: Diagnosis not present

## 2023-06-05 ENCOUNTER — Encounter: Payer: Self-pay | Admitting: Pharmacist

## 2023-06-05 NOTE — Progress Notes (Signed)
Pharmacy Quality Measure Review  This patient is appearing on a report for being at risk of failing the adherence measure for hypertension (ACEi/ARB) medications this calendar year.   Medication: losartan 100 mg Last fill date: 12/19 for 90 day supply  Insurance report was not up to date. No action needed at this time.   Jarrett Ables, PharmD PGY-1 Pharmacy Resident

## 2023-09-14 ENCOUNTER — Other Ambulatory Visit (HOSPITAL_BASED_OUTPATIENT_CLINIC_OR_DEPARTMENT_OTHER): Payer: Self-pay

## 2023-09-14 MED ORDER — LOSARTAN POTASSIUM 100 MG PO TABS
100.0000 mg | ORAL_TABLET | Freq: Every day | ORAL | 3 refills | Status: AC
  Filled 2023-09-14: qty 90, 90d supply, fill #0
  Filled 2023-12-09: qty 90, 90d supply, fill #1
  Filled 2024-03-08: qty 90, 90d supply, fill #2
  Filled 2024-06-06: qty 90, 90d supply, fill #3

## 2023-10-03 ENCOUNTER — Other Ambulatory Visit (HOSPITAL_BASED_OUTPATIENT_CLINIC_OR_DEPARTMENT_OTHER): Payer: Self-pay

## 2023-10-03 MED ORDER — METHOCARBAMOL 500 MG PO TABS
500.0000 mg | ORAL_TABLET | Freq: Four times a day (QID) | ORAL | 3 refills | Status: AC | PRN
  Filled 2023-10-03: qty 90, 12d supply, fill #0
  Filled 2023-12-09: qty 90, 12d supply, fill #1
  Filled 2024-01-13: qty 90, 12d supply, fill #2
  Filled 2024-03-08: qty 90, 12d supply, fill #3

## 2024-03-07 ENCOUNTER — Other Ambulatory Visit (HOSPITAL_BASED_OUTPATIENT_CLINIC_OR_DEPARTMENT_OTHER): Payer: Self-pay

## 2024-03-07 MED ORDER — VALACYCLOVIR HCL 1 G PO TABS
2.0000 g | ORAL_TABLET | Freq: Two times a day (BID) | ORAL | 11 refills | Status: AC | PRN
  Filled 2024-03-07: qty 30, 8d supply, fill #0
